# Patient Record
Sex: Female | Born: 1940
Health system: Southern US, Community
[De-identification: ages and names within clinical notes are randomized; demographics above are authoritative.]

## PROBLEM LIST (undated history)

## (undated) DIAGNOSIS — I10 Essential (primary) hypertension: Secondary | ICD-10-CM

## (undated) DIAGNOSIS — R0789 Other chest pain: Secondary | ICD-10-CM

## (undated) DIAGNOSIS — E785 Hyperlipidemia, unspecified: Secondary | ICD-10-CM

## (undated) DIAGNOSIS — Z8673 Personal history of transient ischemic attack (TIA), and cerebral infarction without residual deficits: Secondary | ICD-10-CM

## (undated) DIAGNOSIS — K219 Gastro-esophageal reflux disease without esophagitis: Secondary | ICD-10-CM

## (undated) DIAGNOSIS — C73 Malignant neoplasm of thyroid gland: Secondary | ICD-10-CM

## (undated) DIAGNOSIS — M1990 Unspecified osteoarthritis, unspecified site: Secondary | ICD-10-CM

## (undated) DIAGNOSIS — N289 Disorder of kidney and ureter, unspecified: Secondary | ICD-10-CM

## (undated) DIAGNOSIS — D649 Anemia, unspecified: Secondary | ICD-10-CM

## (undated) HISTORY — PX: THYROIDECTOMY: SHX17

## (undated) HISTORY — DX: Anemia, unspecified: D64.9

## (undated) HISTORY — PX: BACK SURGERY: SHX140

## (undated) HISTORY — DX: Malignant neoplasm of thyroid gland: C73

## (undated) HISTORY — DX: Unspecified osteoarthritis, unspecified site: M19.90

## (undated) HISTORY — PX: TOTAL KNEE ARTHROPLASTY: SHX125

## (undated) HISTORY — PX: LUMBAR FUSION: SHX111

## (undated) HISTORY — PX: OTHER SURGICAL HISTORY: SHX169

## (undated) HISTORY — DX: Personal history of transient ischemic attack (TIA), and cerebral infarction without residual deficits: Z86.73

## (undated) HISTORY — DX: Gastro-esophageal reflux disease without esophagitis: K21.9

## (undated) HISTORY — DX: Other chest pain: R07.89

## (undated) HISTORY — DX: Hyperlipidemia, unspecified: E78.5

## (undated) HISTORY — PX: BREAST BIOPSY: SHX20

## (undated) HISTORY — DX: Essential (primary) hypertension: I10

---

## 1997-05-28 ENCOUNTER — Other Ambulatory Visit: Admission: RE | Admit: 1997-05-28 | Discharge: 1997-05-28 | Payer: Self-pay | Admitting: Family Medicine

## 1998-07-24 ENCOUNTER — Other Ambulatory Visit: Admission: RE | Admit: 1998-07-24 | Discharge: 1998-07-24 | Payer: Self-pay | Admitting: Family Medicine

## 1998-12-16 ENCOUNTER — Ambulatory Visit (HOSPITAL_COMMUNITY): Admission: RE | Admit: 1998-12-16 | Discharge: 1998-12-16 | Payer: Self-pay | Admitting: Family Medicine

## 1999-08-06 ENCOUNTER — Other Ambulatory Visit: Admission: RE | Admit: 1999-08-06 | Discharge: 1999-08-06 | Payer: Self-pay | Admitting: Family Medicine

## 2000-03-18 ENCOUNTER — Emergency Department (HOSPITAL_COMMUNITY): Admission: EM | Admit: 2000-03-18 | Discharge: 2000-03-18 | Payer: Self-pay | Admitting: Emergency Medicine

## 2000-03-22 ENCOUNTER — Encounter: Admission: RE | Admit: 2000-03-22 | Discharge: 2000-03-22 | Payer: Self-pay | Admitting: Family Medicine

## 2000-03-22 ENCOUNTER — Encounter: Payer: Self-pay | Admitting: Family Medicine

## 2000-08-05 ENCOUNTER — Encounter: Payer: Self-pay | Admitting: Specialist

## 2000-08-05 ENCOUNTER — Encounter: Admission: RE | Admit: 2000-08-05 | Discharge: 2000-08-05 | Payer: Self-pay | Admitting: Specialist

## 2000-08-15 ENCOUNTER — Encounter: Admission: RE | Admit: 2000-08-15 | Discharge: 2000-11-13 | Payer: Self-pay | Admitting: Family Medicine

## 2000-08-15 ENCOUNTER — Other Ambulatory Visit: Admission: RE | Admit: 2000-08-15 | Discharge: 2000-08-15 | Payer: Self-pay | Admitting: Family Medicine

## 2000-08-18 ENCOUNTER — Encounter: Payer: Self-pay | Admitting: Family Medicine

## 2000-08-18 ENCOUNTER — Encounter: Admission: RE | Admit: 2000-08-18 | Discharge: 2000-08-18 | Payer: Self-pay | Admitting: Family Medicine

## 2000-11-03 ENCOUNTER — Encounter: Payer: Self-pay | Admitting: Specialist

## 2000-11-03 ENCOUNTER — Ambulatory Visit (HOSPITAL_COMMUNITY): Admission: RE | Admit: 2000-11-03 | Discharge: 2000-11-03 | Payer: Self-pay | Admitting: Specialist

## 2001-07-06 ENCOUNTER — Encounter: Payer: Self-pay | Admitting: Specialist

## 2001-07-11 ENCOUNTER — Inpatient Hospital Stay (HOSPITAL_COMMUNITY): Admission: RE | Admit: 2001-07-11 | Discharge: 2001-07-19 | Payer: Self-pay | Admitting: Specialist

## 2001-07-12 ENCOUNTER — Encounter: Payer: Self-pay | Admitting: Specialist

## 2001-07-13 ENCOUNTER — Encounter: Payer: Self-pay | Admitting: Specialist

## 2001-07-14 ENCOUNTER — Encounter: Payer: Self-pay | Admitting: Critical Care Medicine

## 2001-08-22 ENCOUNTER — Encounter: Admission: RE | Admit: 2001-08-22 | Discharge: 2001-08-22 | Payer: Self-pay | Admitting: Family Medicine

## 2001-08-22 ENCOUNTER — Encounter: Payer: Self-pay | Admitting: Family Medicine

## 2002-02-21 ENCOUNTER — Ambulatory Visit (HOSPITAL_COMMUNITY): Admission: RE | Admit: 2002-02-21 | Discharge: 2002-02-21 | Payer: Self-pay | Admitting: *Deleted

## 2002-09-18 ENCOUNTER — Other Ambulatory Visit: Admission: RE | Admit: 2002-09-18 | Discharge: 2002-09-18 | Payer: Self-pay | Admitting: Family Medicine

## 2003-05-24 ENCOUNTER — Ambulatory Visit (HOSPITAL_COMMUNITY): Admission: RE | Admit: 2003-05-24 | Discharge: 2003-05-24 | Payer: Self-pay | Admitting: Family Medicine

## 2003-08-05 ENCOUNTER — Ambulatory Visit (HOSPITAL_COMMUNITY): Admission: RE | Admit: 2003-08-05 | Discharge: 2003-08-05 | Payer: Self-pay | Admitting: Family Medicine

## 2003-10-08 ENCOUNTER — Ambulatory Visit: Payer: Self-pay | Admitting: *Deleted

## 2003-10-30 ENCOUNTER — Ambulatory Visit: Payer: Self-pay | Admitting: Family Medicine

## 2003-11-15 ENCOUNTER — Emergency Department (HOSPITAL_COMMUNITY): Admission: EM | Admit: 2003-11-15 | Discharge: 2003-11-15 | Payer: Self-pay | Admitting: Family Medicine

## 2003-11-18 ENCOUNTER — Ambulatory Visit: Payer: Self-pay | Admitting: Family Medicine

## 2003-12-06 ENCOUNTER — Ambulatory Visit: Payer: Self-pay | Admitting: Family Medicine

## 2004-03-02 ENCOUNTER — Ambulatory Visit: Payer: Self-pay | Admitting: Family Medicine

## 2004-03-19 ENCOUNTER — Ambulatory Visit (HOSPITAL_COMMUNITY): Admission: RE | Admit: 2004-03-19 | Discharge: 2004-03-19 | Payer: Self-pay | Admitting: Gastroenterology

## 2004-04-07 ENCOUNTER — Emergency Department (HOSPITAL_COMMUNITY): Admission: EM | Admit: 2004-04-07 | Discharge: 2004-04-07 | Payer: Self-pay | Admitting: Family Medicine

## 2004-04-09 ENCOUNTER — Emergency Department (HOSPITAL_COMMUNITY): Admission: EM | Admit: 2004-04-09 | Discharge: 2004-04-09 | Payer: Self-pay | Admitting: Emergency Medicine

## 2004-06-20 ENCOUNTER — Emergency Department (HOSPITAL_COMMUNITY): Admission: EM | Admit: 2004-06-20 | Discharge: 2004-06-20 | Payer: Self-pay | Admitting: Family Medicine

## 2004-06-23 ENCOUNTER — Ambulatory Visit: Admission: RE | Admit: 2004-06-23 | Discharge: 2004-06-23 | Payer: Self-pay | Admitting: Specialist

## 2004-09-21 ENCOUNTER — Other Ambulatory Visit: Admission: RE | Admit: 2004-09-21 | Discharge: 2004-09-21 | Payer: Self-pay | Admitting: Family Medicine

## 2004-12-31 ENCOUNTER — Emergency Department (HOSPITAL_COMMUNITY): Admission: EM | Admit: 2004-12-31 | Discharge: 2004-12-31 | Payer: Self-pay | Admitting: Emergency Medicine

## 2005-01-12 ENCOUNTER — Emergency Department (HOSPITAL_COMMUNITY): Admission: EM | Admit: 2005-01-12 | Discharge: 2005-01-13 | Payer: Self-pay | Admitting: Family Medicine

## 2005-01-22 ENCOUNTER — Encounter: Admission: RE | Admit: 2005-01-22 | Discharge: 2005-01-22 | Payer: Self-pay | Admitting: Family Medicine

## 2005-04-12 ENCOUNTER — Emergency Department (HOSPITAL_COMMUNITY): Admission: EM | Admit: 2005-04-12 | Discharge: 2005-04-12 | Payer: Self-pay | Admitting: Family Medicine

## 2005-09-05 ENCOUNTER — Emergency Department (HOSPITAL_COMMUNITY): Admission: EM | Admit: 2005-09-05 | Discharge: 2005-09-05 | Payer: Self-pay | Admitting: Emergency Medicine

## 2005-11-19 ENCOUNTER — Encounter: Admission: RE | Admit: 2005-11-19 | Discharge: 2005-11-19 | Payer: Self-pay | Admitting: Family Medicine

## 2006-10-02 ENCOUNTER — Encounter: Admission: RE | Admit: 2006-10-02 | Discharge: 2006-10-02 | Payer: Self-pay | Admitting: Family Medicine

## 2006-10-06 ENCOUNTER — Encounter: Admission: RE | Admit: 2006-10-06 | Discharge: 2006-10-06 | Payer: Self-pay | Admitting: Family Medicine

## 2006-10-21 ENCOUNTER — Emergency Department (HOSPITAL_COMMUNITY): Admission: EM | Admit: 2006-10-21 | Discharge: 2006-10-21 | Payer: Self-pay | Admitting: Emergency Medicine

## 2006-10-27 ENCOUNTER — Encounter: Admission: RE | Admit: 2006-10-27 | Discharge: 2006-10-27 | Payer: Self-pay | Admitting: Family Medicine

## 2006-11-09 ENCOUNTER — Encounter: Admission: RE | Admit: 2006-11-09 | Discharge: 2006-11-09 | Payer: Self-pay | Admitting: Family Medicine

## 2006-12-09 ENCOUNTER — Encounter: Admission: RE | Admit: 2006-12-09 | Discharge: 2006-12-09 | Payer: Self-pay | Admitting: Family Medicine

## 2007-01-10 ENCOUNTER — Encounter: Admission: RE | Admit: 2007-01-10 | Discharge: 2007-01-10 | Payer: Self-pay | Admitting: Family Medicine

## 2007-01-26 ENCOUNTER — Emergency Department (HOSPITAL_COMMUNITY): Admission: EM | Admit: 2007-01-26 | Discharge: 2007-01-26 | Payer: Self-pay | Admitting: Emergency Medicine

## 2007-01-28 ENCOUNTER — Emergency Department (HOSPITAL_COMMUNITY): Admission: EM | Admit: 2007-01-28 | Discharge: 2007-01-28 | Payer: Self-pay | Admitting: Emergency Medicine

## 2007-02-15 ENCOUNTER — Inpatient Hospital Stay (HOSPITAL_COMMUNITY): Admission: RE | Admit: 2007-02-15 | Discharge: 2007-02-23 | Payer: Self-pay | Admitting: Neurosurgery

## 2007-02-17 ENCOUNTER — Ambulatory Visit: Payer: Self-pay | Admitting: Physical Medicine & Rehabilitation

## 2007-05-14 ENCOUNTER — Emergency Department (HOSPITAL_COMMUNITY): Admission: EM | Admit: 2007-05-14 | Discharge: 2007-05-14 | Payer: Self-pay | Admitting: Emergency Medicine

## 2007-05-17 ENCOUNTER — Encounter: Admission: RE | Admit: 2007-05-17 | Discharge: 2007-05-17 | Payer: Self-pay | Admitting: Neurosurgery

## 2007-05-18 ENCOUNTER — Inpatient Hospital Stay (HOSPITAL_COMMUNITY): Admission: AD | Admit: 2007-05-18 | Discharge: 2007-05-26 | Payer: Self-pay | Admitting: Neurosurgery

## 2007-05-20 ENCOUNTER — Ambulatory Visit: Payer: Self-pay | Admitting: Infectious Diseases

## 2007-05-22 ENCOUNTER — Ambulatory Visit: Payer: Self-pay | Admitting: Surgery

## 2007-05-22 ENCOUNTER — Encounter: Payer: Self-pay | Admitting: Infectious Diseases

## 2007-06-02 ENCOUNTER — Encounter: Admission: RE | Admit: 2007-06-02 | Discharge: 2007-06-02 | Payer: Self-pay | Admitting: Neurosurgery

## 2007-06-07 ENCOUNTER — Encounter: Payer: Self-pay | Admitting: Infectious Disease

## 2007-06-09 ENCOUNTER — Telehealth: Payer: Self-pay | Admitting: Infectious Disease

## 2007-06-09 DIAGNOSIS — K6812 Psoas muscle abscess: Secondary | ICD-10-CM | POA: Insufficient documentation

## 2007-06-21 ENCOUNTER — Encounter: Payer: Self-pay | Admitting: Infectious Disease

## 2007-06-26 ENCOUNTER — Encounter: Admission: RE | Admit: 2007-06-26 | Discharge: 2007-06-26 | Payer: Self-pay | Admitting: Infectious Disease

## 2007-07-03 ENCOUNTER — Ambulatory Visit: Payer: Self-pay | Admitting: Infectious Disease

## 2007-07-03 DIAGNOSIS — E119 Type 2 diabetes mellitus without complications: Secondary | ICD-10-CM | POA: Insufficient documentation

## 2007-07-03 DIAGNOSIS — G061 Intraspinal abscess and granuloma: Secondary | ICD-10-CM | POA: Insufficient documentation

## 2007-07-03 DIAGNOSIS — I10 Essential (primary) hypertension: Secondary | ICD-10-CM | POA: Insufficient documentation

## 2007-07-03 DIAGNOSIS — Z794 Long term (current) use of insulin: Secondary | ICD-10-CM

## 2007-07-03 DIAGNOSIS — E039 Hypothyroidism, unspecified: Secondary | ICD-10-CM | POA: Insufficient documentation

## 2007-07-03 LAB — CONVERTED CEMR LAB
Basophils Absolute: 0 10*3/uL (ref 0.0–0.1)
CO2: 23 meq/L (ref 19–32)
Chloride: 101 meq/L (ref 96–112)
Creatinine, Ser: 0.55 mg/dL (ref 0.40–1.20)
Eosinophils Relative: 1 % (ref 0–5)
HCT: 37 % (ref 36.0–46.0)
Lymphocytes Relative: 11 % — ABNORMAL LOW (ref 12–46)
Lymphs Abs: 1.4 10*3/uL (ref 0.7–4.0)
Neutro Abs: 10.3 10*3/uL — ABNORMAL HIGH (ref 1.7–7.7)
Neutrophils Relative %: 83 % — ABNORMAL HIGH (ref 43–77)
Platelets: 407 10*3/uL — ABNORMAL HIGH (ref 150–400)
Potassium: 4.4 meq/L (ref 3.5–5.3)
RDW: 15.6 % — ABNORMAL HIGH (ref 11.5–15.5)
Sed Rate: 14 mm/hr (ref 0–22)
Sodium: 140 meq/L (ref 135–145)
WBC: 12.5 10*3/uL — ABNORMAL HIGH (ref 4.0–10.5)

## 2007-08-07 ENCOUNTER — Encounter: Admission: RE | Admit: 2007-08-07 | Discharge: 2007-08-07 | Payer: Self-pay | Admitting: Infectious Disease

## 2007-08-14 ENCOUNTER — Ambulatory Visit: Payer: Self-pay | Admitting: Infectious Disease

## 2007-08-14 DIAGNOSIS — D72829 Elevated white blood cell count, unspecified: Secondary | ICD-10-CM | POA: Insufficient documentation

## 2007-08-14 LAB — CONVERTED CEMR LAB
ALT: 14 units/L (ref 0–35)
AST: 16 units/L (ref 0–37)
Alkaline Phosphatase: 80 units/L (ref 39–117)
CO2: 27 meq/L (ref 19–32)
Calcium: 8.2 mg/dL — ABNORMAL LOW (ref 8.4–10.5)
Creatinine, Ser: 0.68 mg/dL (ref 0.40–1.20)
Eosinophils Absolute: 0.2 10*3/uL (ref 0.0–0.7)
Glucose, Bld: 102 mg/dL — ABNORMAL HIGH (ref 70–99)
Hemoglobin: 11.3 g/dL — ABNORMAL LOW (ref 12.0–15.0)
Lymphs Abs: 2.3 10*3/uL (ref 0.7–4.0)
MCHC: 30.7 g/dL (ref 30.0–36.0)
MCV: 88.7 fL (ref 78.0–100.0)
Monocytes Absolute: 0.9 10*3/uL (ref 0.1–1.0)
Monocytes Relative: 6 % (ref 3–12)
Neutro Abs: 10.9 10*3/uL — ABNORMAL HIGH (ref 1.7–7.7)
Neutrophils Relative %: 76 % (ref 43–77)
Potassium: 4.5 meq/L (ref 3.5–5.3)
RBC: 4.15 M/uL (ref 3.87–5.11)
Sodium: 145 meq/L (ref 135–145)
Total Bilirubin: 0.3 mg/dL (ref 0.3–1.2)
WBC: 14.3 10*3/uL — ABNORMAL HIGH (ref 4.0–10.5)

## 2007-08-25 ENCOUNTER — Encounter: Payer: Self-pay | Admitting: Infectious Disease

## 2007-09-19 ENCOUNTER — Encounter: Admission: RE | Admit: 2007-09-19 | Discharge: 2007-09-19 | Payer: Self-pay | Admitting: Family Medicine

## 2007-10-20 ENCOUNTER — Encounter: Payer: Self-pay | Admitting: Infectious Disease

## 2007-12-08 ENCOUNTER — Encounter: Payer: Self-pay | Admitting: Infectious Disease

## 2007-12-18 ENCOUNTER — Ambulatory Visit: Payer: Self-pay | Admitting: Infectious Disease

## 2007-12-18 ENCOUNTER — Ambulatory Visit (HOSPITAL_COMMUNITY): Admission: RE | Admit: 2007-12-18 | Discharge: 2007-12-18 | Payer: Self-pay | Admitting: Infectious Disease

## 2007-12-18 DIAGNOSIS — R079 Chest pain, unspecified: Secondary | ICD-10-CM | POA: Insufficient documentation

## 2008-03-11 ENCOUNTER — Emergency Department (HOSPITAL_COMMUNITY): Admission: EM | Admit: 2008-03-11 | Discharge: 2008-03-11 | Payer: Self-pay | Admitting: Emergency Medicine

## 2008-03-26 DIAGNOSIS — M48061 Spinal stenosis, lumbar region without neurogenic claudication: Secondary | ICD-10-CM | POA: Insufficient documentation

## 2008-04-07 ENCOUNTER — Emergency Department (HOSPITAL_COMMUNITY): Admission: EM | Admit: 2008-04-07 | Discharge: 2008-04-07 | Payer: Self-pay | Admitting: Emergency Medicine

## 2008-05-22 ENCOUNTER — Encounter: Admission: RE | Admit: 2008-05-22 | Discharge: 2008-05-22 | Payer: Self-pay | Admitting: Family Medicine

## 2008-06-07 ENCOUNTER — Encounter: Payer: Self-pay | Admitting: Infectious Disease

## 2008-07-25 ENCOUNTER — Inpatient Hospital Stay (HOSPITAL_COMMUNITY): Admission: EM | Admit: 2008-07-25 | Discharge: 2008-07-26 | Payer: Self-pay | Admitting: Emergency Medicine

## 2008-07-25 ENCOUNTER — Ambulatory Visit: Payer: Self-pay | Admitting: Vascular Surgery

## 2008-07-25 ENCOUNTER — Encounter (INDEPENDENT_AMBULATORY_CARE_PROVIDER_SITE_OTHER): Payer: Self-pay | Admitting: Internal Medicine

## 2008-08-15 ENCOUNTER — Ambulatory Visit: Payer: Self-pay | Admitting: Infectious Disease

## 2008-08-15 DIAGNOSIS — I69959 Hemiplegia and hemiparesis following unspecified cerebrovascular disease affecting unspecified side: Secondary | ICD-10-CM | POA: Insufficient documentation

## 2008-08-15 DIAGNOSIS — I10 Essential (primary) hypertension: Secondary | ICD-10-CM | POA: Insufficient documentation

## 2008-08-15 LAB — CONVERTED CEMR LAB
BUN: 11 mg/dL (ref 6–23)
Basophils Relative: 0 % (ref 0–1)
CRP: 0.4 mg/dL (ref <0.6)
Calcium: 8.4 mg/dL (ref 8.4–10.5)
Chloride: 105 meq/L (ref 96–112)
Creatinine, Ser: 0.65 mg/dL (ref 0.40–1.20)
Eosinophils Absolute: 0.2 10*3/uL (ref 0.0–0.7)
Lymphs Abs: 2 10*3/uL (ref 0.7–4.0)
MCHC: 31.6 g/dL (ref 30.0–36.0)
MCV: 88.9 fL (ref >=78.0)
Monocytes Relative: 5 % (ref 3–12)
Neutro Abs: 10.4 10*3/uL — ABNORMAL HIGH (ref 1.7–7.7)
Neutrophils Relative %: 79 % — ABNORMAL HIGH (ref 43–77)
Platelets: 306 10*3/uL (ref 150–400)
RBC: 4.06 M/uL (ref 3.87–5.11)
Sed Rate: 10 mm/hr (ref 0–22)
WBC: 13.3 10*3/uL — ABNORMAL HIGH (ref 4.0–10.5)

## 2008-08-21 ENCOUNTER — Encounter: Admission: RE | Admit: 2008-08-21 | Discharge: 2008-08-21 | Payer: Self-pay | Admitting: Family Medicine

## 2008-08-21 ENCOUNTER — Encounter (INDEPENDENT_AMBULATORY_CARE_PROVIDER_SITE_OTHER): Payer: Self-pay | Admitting: Interventional Radiology

## 2008-08-21 ENCOUNTER — Other Ambulatory Visit: Admission: RE | Admit: 2008-08-21 | Discharge: 2008-08-21 | Payer: Self-pay | Admitting: Interventional Radiology

## 2008-09-26 DIAGNOSIS — Z85850 Personal history of malignant neoplasm of thyroid: Secondary | ICD-10-CM | POA: Insufficient documentation

## 2008-09-28 ENCOUNTER — Emergency Department (HOSPITAL_COMMUNITY): Admission: EM | Admit: 2008-09-28 | Discharge: 2008-09-28 | Payer: Self-pay | Admitting: Emergency Medicine

## 2008-10-02 ENCOUNTER — Telehealth: Payer: Self-pay

## 2008-10-02 ENCOUNTER — Ambulatory Visit: Payer: Self-pay | Admitting: Infectious Disease

## 2008-10-02 ENCOUNTER — Observation Stay (HOSPITAL_COMMUNITY): Admission: AD | Admit: 2008-10-02 | Discharge: 2008-10-04 | Payer: Self-pay | Admitting: Internal Medicine

## 2008-10-02 DIAGNOSIS — M549 Dorsalgia, unspecified: Secondary | ICD-10-CM | POA: Insufficient documentation

## 2008-10-02 DIAGNOSIS — R110 Nausea: Secondary | ICD-10-CM | POA: Insufficient documentation

## 2008-10-03 ENCOUNTER — Ambulatory Visit: Payer: Self-pay | Admitting: Infectious Diseases

## 2008-10-07 ENCOUNTER — Encounter: Payer: Self-pay | Admitting: Infectious Disease

## 2008-10-09 ENCOUNTER — Encounter: Admission: RE | Admit: 2008-10-09 | Discharge: 2008-10-09 | Payer: Self-pay | Admitting: Family Medicine

## 2008-10-16 DIAGNOSIS — I69359 Hemiplegia and hemiparesis following cerebral infarction affecting unspecified side: Secondary | ICD-10-CM | POA: Insufficient documentation

## 2008-10-24 ENCOUNTER — Encounter (INDEPENDENT_AMBULATORY_CARE_PROVIDER_SITE_OTHER): Payer: Self-pay | Admitting: General Surgery

## 2008-10-24 ENCOUNTER — Inpatient Hospital Stay (HOSPITAL_COMMUNITY): Admission: RE | Admit: 2008-10-24 | Discharge: 2008-10-27 | Payer: Self-pay | Admitting: General Surgery

## 2008-11-19 DIAGNOSIS — J309 Allergic rhinitis, unspecified: Secondary | ICD-10-CM | POA: Insufficient documentation

## 2008-12-09 ENCOUNTER — Encounter (HOSPITAL_COMMUNITY): Admission: RE | Admit: 2008-12-09 | Discharge: 2009-01-03 | Payer: Self-pay | Admitting: Endocrinology

## 2008-12-18 ENCOUNTER — Encounter (HOSPITAL_COMMUNITY): Admission: RE | Admit: 2008-12-18 | Discharge: 2009-01-03 | Payer: Self-pay | Admitting: Endocrinology

## 2009-02-06 ENCOUNTER — Encounter: Payer: Self-pay | Admitting: Infectious Disease

## 2009-02-10 ENCOUNTER — Emergency Department (HOSPITAL_COMMUNITY): Admission: EM | Admit: 2009-02-10 | Discharge: 2009-02-10 | Payer: Self-pay | Admitting: Emergency Medicine

## 2009-02-10 ENCOUNTER — Ambulatory Visit (HOSPITAL_COMMUNITY): Admission: RE | Admit: 2009-02-10 | Discharge: 2009-02-10 | Payer: Self-pay | Admitting: Infectious Disease

## 2009-02-10 ENCOUNTER — Encounter (INDEPENDENT_AMBULATORY_CARE_PROVIDER_SITE_OTHER): Payer: Self-pay | Admitting: Emergency Medicine

## 2009-02-10 ENCOUNTER — Ambulatory Visit: Payer: Self-pay | Admitting: Infectious Disease

## 2009-02-10 DIAGNOSIS — R0789 Other chest pain: Secondary | ICD-10-CM | POA: Insufficient documentation

## 2009-02-10 DIAGNOSIS — Z85850 Personal history of malignant neoplasm of thyroid: Secondary | ICD-10-CM

## 2009-02-11 ENCOUNTER — Ambulatory Visit: Payer: Self-pay | Admitting: Surgery

## 2009-02-11 ENCOUNTER — Encounter (INDEPENDENT_AMBULATORY_CARE_PROVIDER_SITE_OTHER): Payer: Self-pay | Admitting: Emergency Medicine

## 2009-02-11 ENCOUNTER — Ambulatory Visit (HOSPITAL_COMMUNITY): Admission: RE | Admit: 2009-02-11 | Discharge: 2009-02-11 | Payer: Self-pay | Admitting: Emergency Medicine

## 2009-02-17 ENCOUNTER — Encounter: Payer: Self-pay | Admitting: Infectious Disease

## 2009-03-10 ENCOUNTER — Encounter: Admission: RE | Admit: 2009-03-10 | Discharge: 2009-03-10 | Payer: Self-pay | Admitting: Cardiology

## 2009-03-18 ENCOUNTER — Inpatient Hospital Stay (HOSPITAL_BASED_OUTPATIENT_CLINIC_OR_DEPARTMENT_OTHER): Admission: RE | Admit: 2009-03-18 | Discharge: 2009-03-18 | Payer: Self-pay | Admitting: Cardiology

## 2009-03-18 HISTORY — PX: CARDIAC CATHETERIZATION: SHX172

## 2009-08-07 ENCOUNTER — Ambulatory Visit: Payer: Self-pay | Admitting: Infectious Disease

## 2009-08-07 DIAGNOSIS — IMO0002 Reserved for concepts with insufficient information to code with codable children: Secondary | ICD-10-CM | POA: Insufficient documentation

## 2009-08-07 DIAGNOSIS — M1710 Unilateral primary osteoarthritis, unspecified knee: Secondary | ICD-10-CM

## 2009-08-07 LAB — CONVERTED CEMR LAB
Basophils Absolute: 0 10*3/uL (ref 0.0–0.1)
Basophils Relative: 0 % (ref 0–1)
CO2: 29 meq/L (ref 19–32)
Chloride: 104 meq/L (ref 96–112)
Creatinine, Ser: 1.03 mg/dL (ref 0.40–1.20)
Eosinophils Relative: 2 % (ref 0–5)
Glucose, Bld: 97 mg/dL (ref 70–99)
HCT: 31 % — ABNORMAL LOW (ref 36.0–46.0)
Hemoglobin: 9.6 g/dL — ABNORMAL LOW (ref 12.0–15.0)
MCHC: 31 g/dL (ref 30.0–36.0)
Monocytes Absolute: 0.8 10*3/uL (ref 0.1–1.0)
Neutro Abs: 7.7 10*3/uL (ref 1.7–7.7)
Platelets: 297 10*3/uL (ref 150–400)
RDW: 13.1 % (ref 11.5–15.5)
Sed Rate: 23 mm/hr — ABNORMAL HIGH (ref 0–22)

## 2009-08-22 DIAGNOSIS — R918 Other nonspecific abnormal finding of lung field: Secondary | ICD-10-CM | POA: Insufficient documentation

## 2009-09-06 ENCOUNTER — Emergency Department (HOSPITAL_COMMUNITY): Admission: EM | Admit: 2009-09-06 | Discharge: 2009-09-06 | Payer: Self-pay | Admitting: Family Medicine

## 2009-09-16 ENCOUNTER — Ambulatory Visit (HOSPITAL_COMMUNITY): Admission: RE | Admit: 2009-09-16 | Discharge: 2009-09-16 | Payer: Self-pay | Admitting: Neurosurgery

## 2009-09-17 IMAGING — US US EXTREM LOW VENOUS BILAT
1 series · 14 of 24 positions shown · non-contrast
Comparison: None.

CLINICAL DATA: Bilateral lower extremity pain, swelling, and bruising. Assess
for DVT.
TECHNIQUE: Ultrasound scanning of the major venous structures of the thigh was
performed using color doppler suplmentation.

[Series 1: unknown · 14 of 46 slices shown]
[im 1/46]
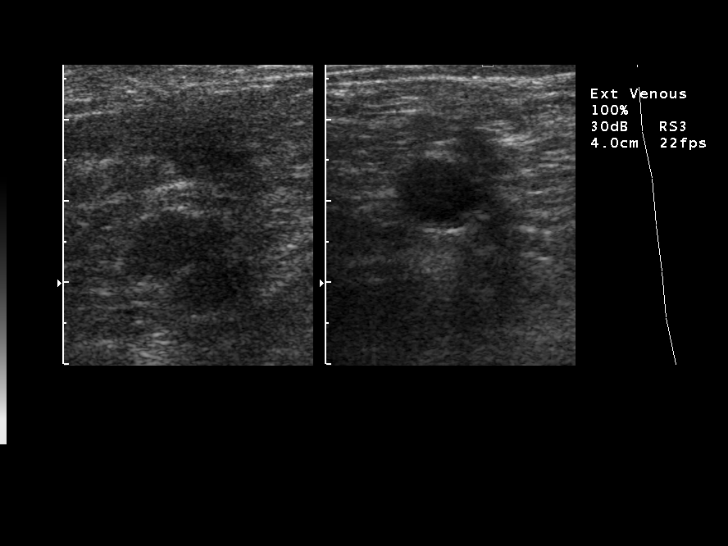
[im 4/46]
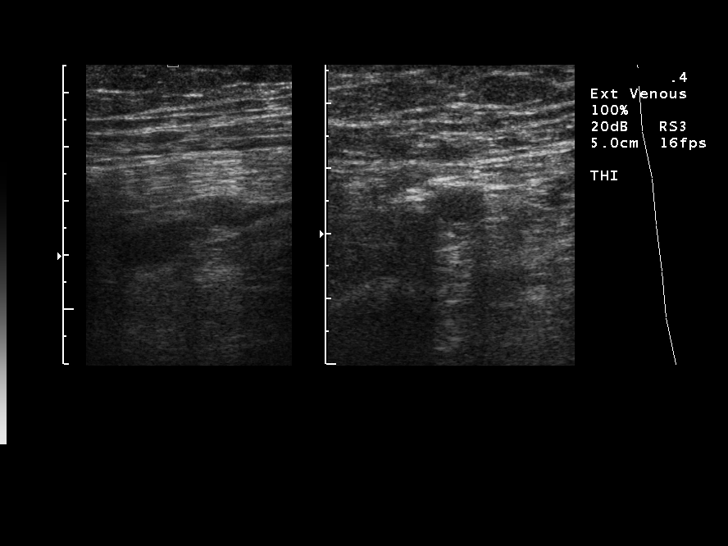
[im 8/46]
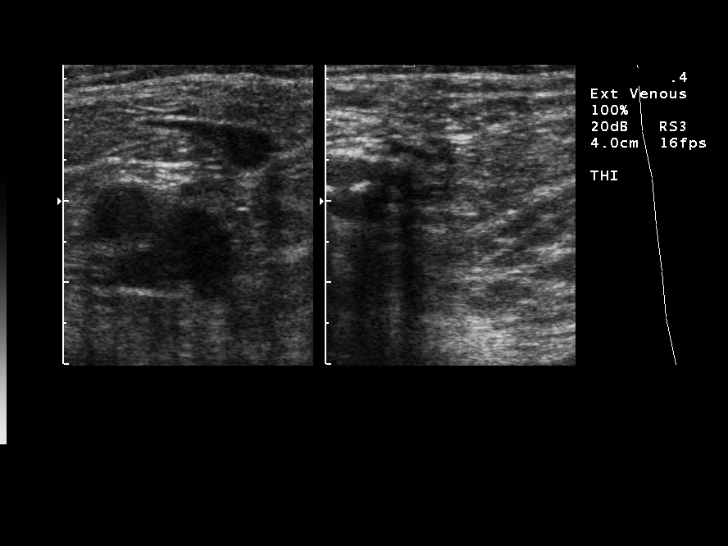
[im 12/46]
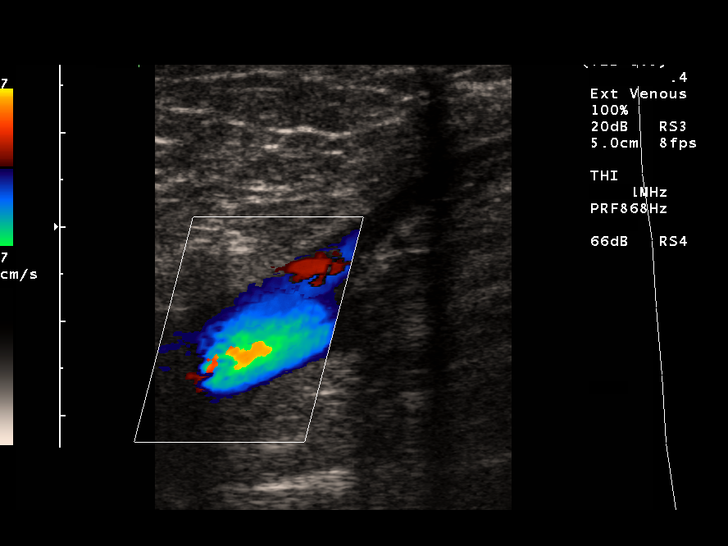
[im 14/46]
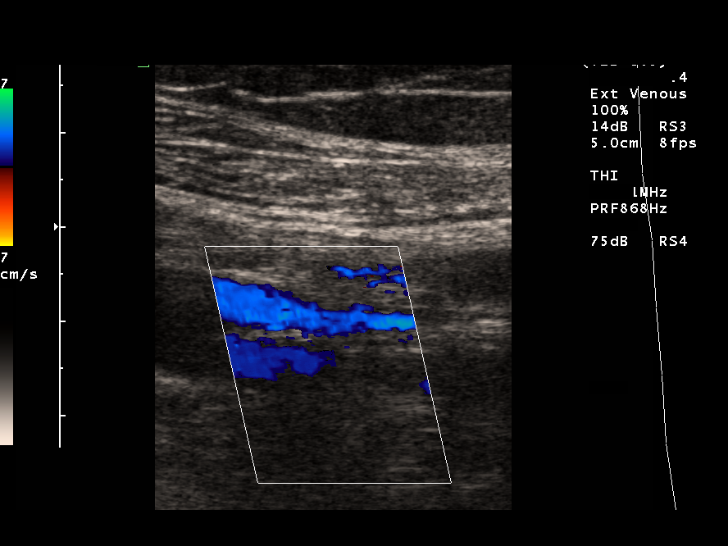
[im 18/46]
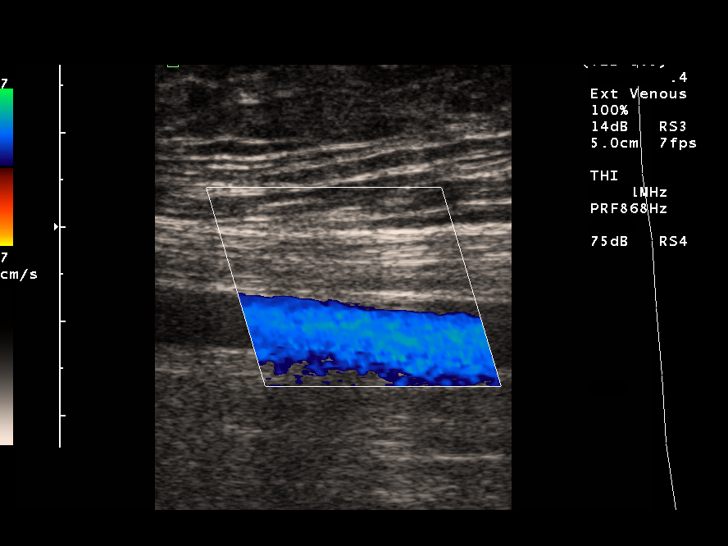
[im 22/46]
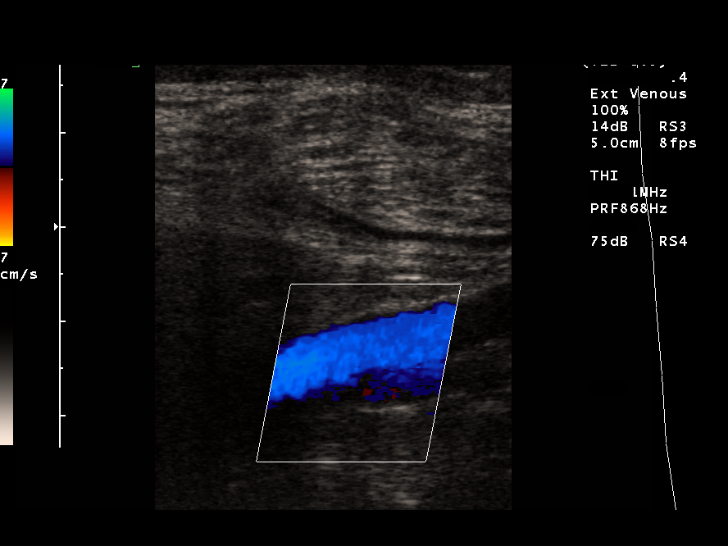
[im 24/46]
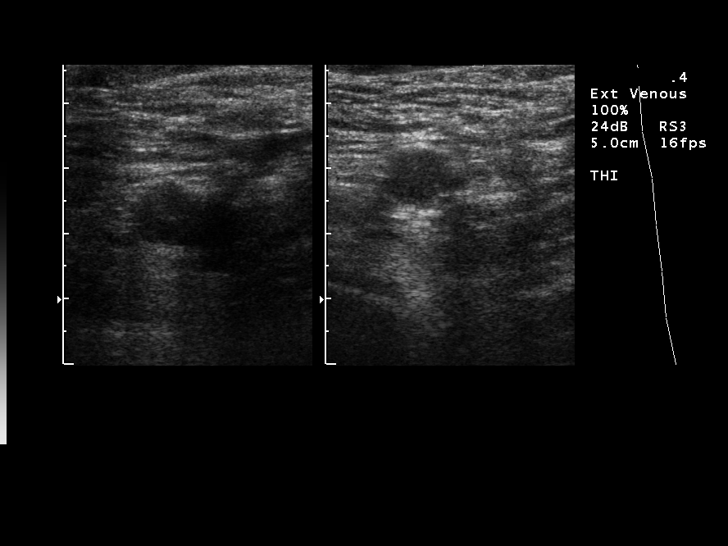
[im 28/46]
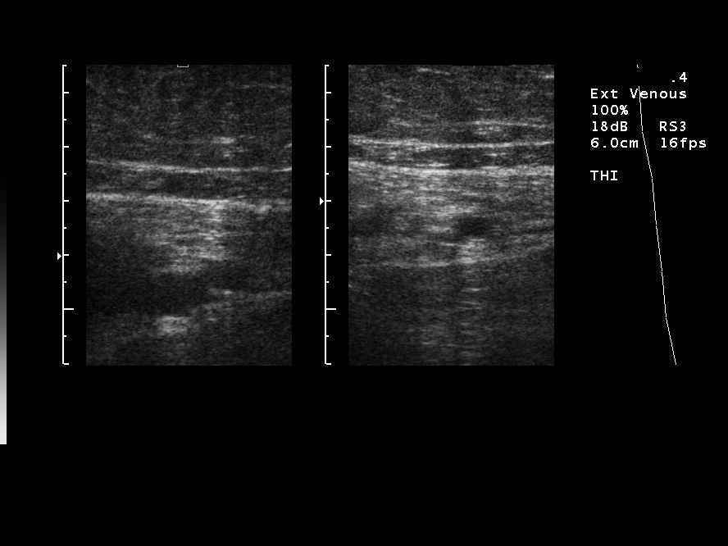
[im 32/46]
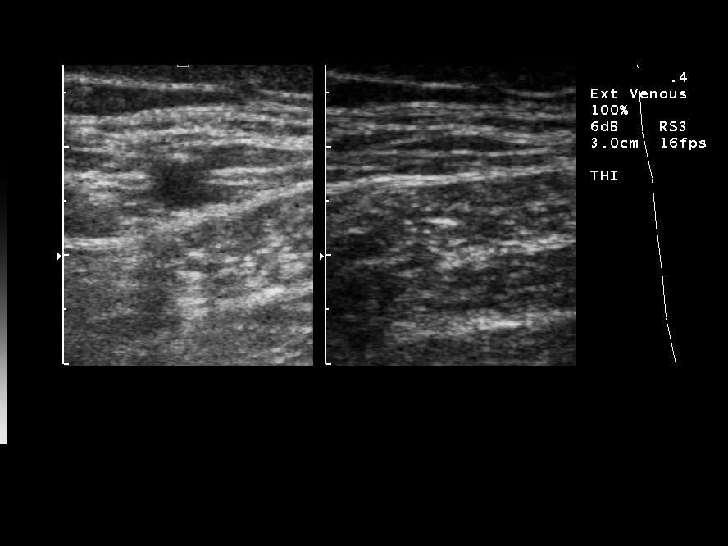
[im 36/46]
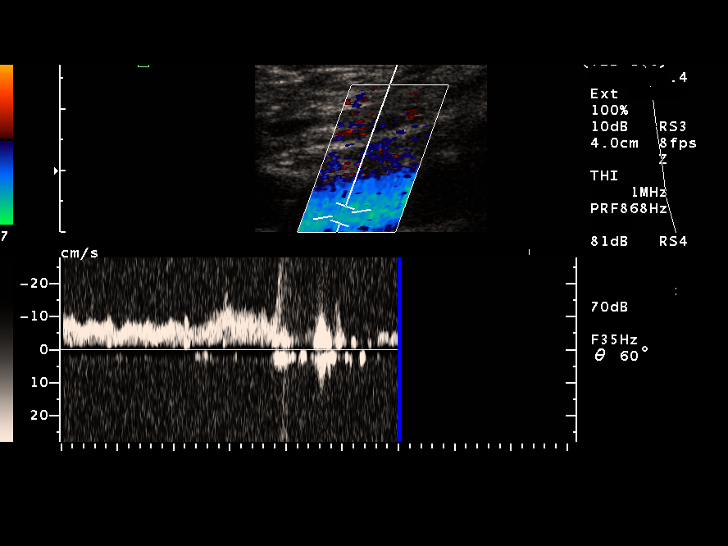
[im 38/46]
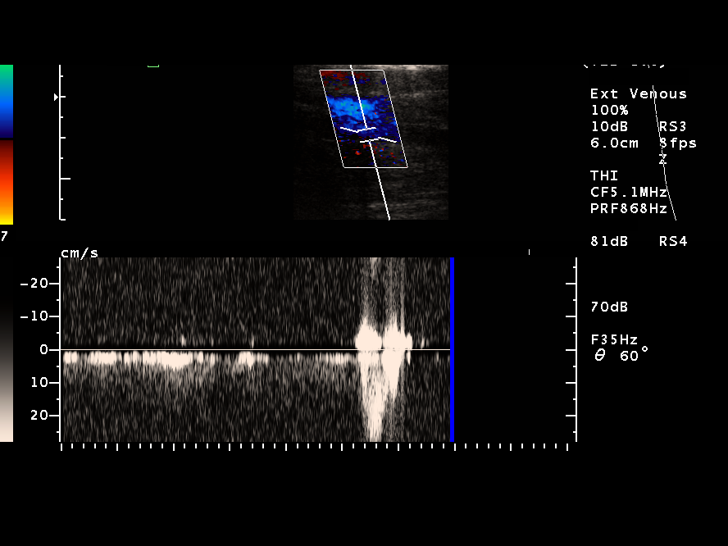
[im 42/46]
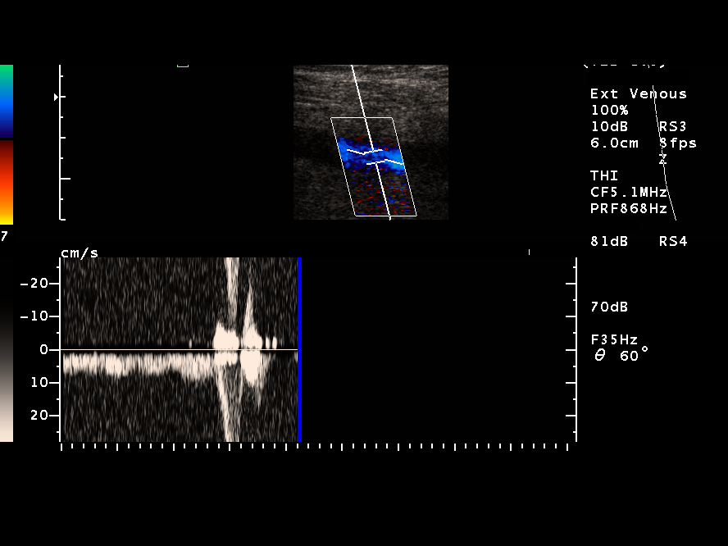
[im 46/46]
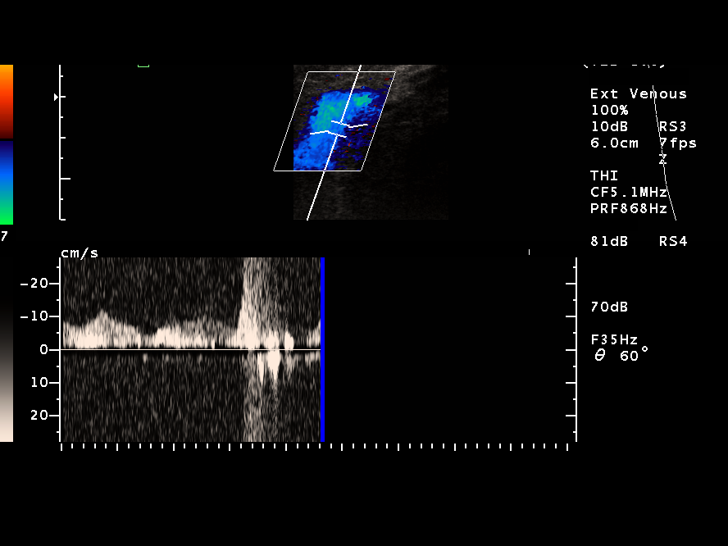

[14 of 24 positions shown; findings below may reference images not displayed]

LOWER EXTREMITY VENOUS ULTRASOUND BILATERAL:

Both lower extremities were examined.  The common femoral veins, superficial
femoral veins, deep femoral veins and popliteal veins show normal patent
directional flow, normal phasicity, normal augmentation and normal compression. 
The posterior tibial veins demonstrate normal compression.
IMPRESSION: No evidence for DVT in either lower extremity.

## 2009-09-26 ENCOUNTER — Emergency Department (HOSPITAL_COMMUNITY): Admission: EM | Admit: 2009-09-26 | Discharge: 2009-09-26 | Payer: Self-pay | Admitting: Emergency Medicine

## 2009-09-26 DIAGNOSIS — M8580 Other specified disorders of bone density and structure, unspecified site: Secondary | ICD-10-CM | POA: Insufficient documentation

## 2009-09-26 DIAGNOSIS — E890 Postprocedural hypothyroidism: Secondary | ICD-10-CM | POA: Insufficient documentation

## 2009-09-26 DIAGNOSIS — E1169 Type 2 diabetes mellitus with other specified complication: Secondary | ICD-10-CM | POA: Insufficient documentation

## 2009-10-02 ENCOUNTER — Emergency Department (HOSPITAL_COMMUNITY): Admission: EM | Admit: 2009-10-02 | Discharge: 2009-10-02 | Payer: Self-pay | Admitting: Emergency Medicine

## 2009-10-10 ENCOUNTER — Encounter: Admission: RE | Admit: 2009-10-10 | Discharge: 2009-10-10 | Payer: Self-pay | Admitting: Family Medicine

## 2009-10-14 ENCOUNTER — Encounter: Payer: Self-pay | Admitting: Infectious Disease

## 2009-10-22 ENCOUNTER — Ambulatory Visit (HOSPITAL_COMMUNITY): Admission: RE | Admit: 2009-10-22 | Discharge: 2009-10-22 | Payer: Self-pay | Admitting: Specialist

## 2009-11-10 ENCOUNTER — Encounter: Payer: Self-pay | Admitting: Infectious Disease

## 2010-01-19 ENCOUNTER — Encounter (HOSPITAL_COMMUNITY)
Admission: RE | Admit: 2010-01-19 | Discharge: 2010-02-03 | Payer: Self-pay | Source: Home / Self Care | Attending: Endocrinology | Admitting: Endocrinology

## 2010-01-25 ENCOUNTER — Encounter: Payer: Self-pay | Admitting: Family Medicine

## 2010-01-26 ENCOUNTER — Encounter: Payer: Self-pay | Admitting: Family Medicine

## 2010-02-01 LAB — CONVERTED CEMR LAB
AST: 15 units/L (ref 0–37)
Albumin: 3.3 g/dL — ABNORMAL LOW (ref 3.5–5.2)
Albumin: 3.7 g/dL (ref 3.5–5.2)
Alkaline Phosphatase: 39 units/L (ref 39–117)
BUN: 9 mg/dL (ref 6–23)
Basophils Absolute: 0 10*3/uL (ref 0.0–0.1)
CO2: 30 meq/L (ref 19–32)
CRP: 0.7 mg/dL — ABNORMAL HIGH (ref <0.6)
Calcium: 7.3 mg/dL — ABNORMAL LOW (ref 8.4–10.5)
Chloride: 103 meq/L (ref 96–112)
Creatinine, Ser: 0.56 mg/dL (ref 0.40–1.20)
Eosinophils Relative: 1 % (ref 0–5)
Eosinophils Relative: 2 % (ref 0–5)
Glucose, Bld: 89 mg/dL (ref 70–99)
HCT: 32.8 % — ABNORMAL LOW (ref 36.0–46.0)
HCT: 34.4 % — ABNORMAL LOW (ref 36.0–46.0)
Hemoglobin: 11.2 g/dL — ABNORMAL LOW (ref 12.0–15.0)
Lymphocytes Relative: 13 % (ref 12–46)
Lymphocytes Relative: 15 % (ref 12–46)
Lymphs Abs: 1.3 10*3/uL (ref 0.7–4.0)
Lymphs Abs: 1.7 10*3/uL (ref 0.7–4.0)
Monocytes Absolute: 0.6 10*3/uL (ref 0.1–1.0)
Monocytes Relative: 5 % (ref 3–12)
Neutro Abs: 8.4 10*3/uL — ABNORMAL HIGH (ref 1.7–7.7)
Neutro Abs: 8.6 10*3/uL — ABNORMAL HIGH (ref 1.7–7.7)
Neutrophils Relative %: 80 % — ABNORMAL HIGH (ref 43–77)
Platelets: 294 10*3/uL (ref 150–400)
Potassium: 3.9 meq/L (ref 3.5–5.3)
Potassium: 3.9 meq/L (ref 3.5–5.3)
RBC: 3.93 M/uL (ref 3.87–5.11)
RDW: 13.3 % (ref 11.5–15.5)
Relative Index: 2.1 (ref 0.0–2.5)
Sed Rate: 20 mm/hr (ref 0–22)
Sodium: 138 meq/L (ref 135–145)
Total Bilirubin: 0.4 mg/dL (ref 0.3–1.2)
Total CK: 112 units/L (ref 7–177)
Total Protein: 6.8 g/dL (ref 6.0–8.3)
Troponin I: <0.01 ng/mL (ref <0.06)
WBC: 10.7 10*3/uL — ABNORMAL HIGH (ref 4.0–10.5)
WBC: 10.9 10*3/uL — ABNORMAL HIGH (ref 4.0–10.5)

## 2010-02-05 NOTE — Consult Note (Signed)
Summary: [facility]  [facility]   Imported By: Bonner Puna 11/18/2009 15:41:05  _____________________________________________________________________  External Attachment:    Type:   Image     Comment:   External Document

## 2010-02-05 NOTE — Consult Note (Signed)
Summary: [facility]: New Pt. Eval  [facility]: New Pt. Eval   Imported By: Bonner Puna 02/27/2009 14:54:41  _____________________________________________________________________  External Attachment:    Type:   Image     Comment:   External Document

## 2010-02-05 NOTE — Assessment & Plan Note (Signed)
Summary: fukam   Visit Type:  Follow-up Primary Provider:  dewey  CC:  F/U.  History of Present Illness: 70 year old AA lady is s.p L4-5 decompression and fusion by Dr. [name] in February 2009 for severe spondylolisthesis, spinal stenosis with severe lower extremity pain. Pt then developed L3-4 diskitis, and epidural and paraspinal fluid collections in May 2009 when she underwent I and D by Dr. [name]. Her cultures on oral antibiotics failed to yield an organism. She was treated with aztreonama nd vancomycin, and later changed to oral suppressive rifampin and doxycycline. She is on  doxycyline alone. Her back pain is improving on tylenol #3. The pain is  goes from mid back up the back. Pain is worse with standing for prolonged times. Cant cook when standing. She uses a stool  She has new complaint of chest pain. Mid sternal pain which has been going on for a month. Seems like it happens with exertion. It is a funny feeling, tingling like pain, pain is 8/10 and it frightens her. No difficulty breathing. She does have diaphoresis with it.  Does not radiate to shoulder or back. AFter sitting down and praying it goes away--she is unsure of how long this. takes. SHe is very scared abou this. Her sister who also had a massive stroke was found. Pain is happening off an on for several weeks. Last had on Friday. Primary MD is [facility]. She has hx of CVA, hyperlipidemia, and HTN. She is wihtout chest pain now. Pushing on her chest produces some discomfort but does not duplicate the pain she has been having.  She is also having swelling of her right leg that is worse in the past month. She claims to have hx of chronic swelling of the right leg that comes and goes and has done so for months.  Preventive Screening-Counseling & Management  Alcohol-Tobacco     Alcohol drinks/day: 0     Smoking Status: never     Year Quit: years ago     Passive Smoke Exposure:  no  Caffeine-Diet-Exercise     Caffeine use/day: coffee,tea     Does Patient Exercise: yes     Type of exercise: rehab/therapy     Exercise (avg: min/session): 30-60     Times/week: 3   Current Allergies (reviewed today): ! KEFLEX ! * AVANDIA Past History:  Past Medical History: Last updated: 10/02/2008 Severe lumbar spondylosthesis s.p surgery in February with L4-5 decompressiona and fusion  Surgery on 5/ diabetes,   hypertension hypercholesterolemia  hypothyroidism  osteoarthritis of   the left knee.  She has had a previous right knee replacement for   osteoarthritis.  CVA   Past Surgical History: Last updated: 07/03/2007 July 11, 2001  Right total knee arthroplasty. ]  FEbruary 11th, 2009  1. Bilateral L4 laminotomies.   2. Decompression of the bilateral L4 and L5 nerve roots; L4-5       transforaminal lumbar interbody fusion with local autograft bone       and Actifuse  bone graft extender; insertion of L4-5 interbody       prosthesis (Capstone PEEK interbody prosthesis), L4-5 posterior non-       segmental instrumentation with legacy titanium plates, screws, and       rods; L4-5 posterolateral arthrodesis with local morselized       autograft bone and Actifuse bone graft extender.  05/20/07  1. Exploration of lumbar wound/arthrodesis.   2. Bilateral L 4 laminotomies, foraminotomies  to decompress the       bilateral L5 nerve roots.   3. Epidural cultures.   Family History: Last updated: 02/10/2009 no early coronary artery disease, sister with massive cva  Social History: Last updated: 10/02/2008 Never Smoked Alcohol use-no Drug use-no Daughter living with pt but daughter works most of the day  Risk Factors: Alcohol Use: 0 (02/10/2009) Caffeine Use: coffee,tea (02/10/2009) Exercise: yes (02/10/2009)  Risk Factors: Smoking Status: never (02/10/2009) Passive Smoke Exposure: no (02/10/2009)  Family History: Reviewed history from 07/03/2007 and no  changes required. no early coronary artery disease, sister with massive cva  Social History: Reviewed history from 10/02/2008 and no changes required. Never Smoked Alcohol use-no Drug use-no Daughter living with pt but daughter works most of the day  Review of Systems       The patient complains of chest pain and difficulty walking.  The patient denies anorexia, fever, weight loss, weight gain, vision loss, decreased hearing, hoarseness, syncope, dyspnea on exertion, peripheral edema, prolonged cough, headaches, hemoptysis, abdominal pain, melena, hematochezia, severe indigestion/heartburn, hematuria, incontinence, genital sores, muscle weakness, suspicious skin lesions, transient blindness, depression, unusual weight change, abnormal bleeding, enlarged lymph nodes, and breast masses.    Vital Signs:  Patient profile:   70 year old female Menstrual status:  postmenopausal Height:      67 inches (170.18 cm) Weight:      257 pounds (116.82 kg) BMI:     40.40 Temp:     97.2 degrees F (36.22 degrees C) oral Pulse rate:   79 / minute BP sitting:   161 / 76  (left arm) Cuff size:   large  Vitals Entered By: Jarrett Ables CMA (February 10, 2009 9:49 AM) CC: F/U Is Patient Diabetic? Yes Did you bring your meter with you today? No Pain Assessment Patient in pain? no      Nutritional Status BMI of > 30 = obese Nutritional Status Detail NL  Does patient need assistance? Functional Status Self care Ambulation Normal   Physical Exam  General:  alert and overweight-appearing.  Head:  normocephalic and atraumatic.   Eyes:  vision grossly intact, pupils equal, pupils round, and pupils reactive to light.   Ears:  no external deformities.   Nose:  no external deformity.   Mouth:  pharynx pink and moist, no erythema, and no exudates.   Chest Wall:  some tenderness with palpation of her sternum but does not reproduce her chest pain she has been having Lungs:  normal respiratory  effort, no crackles, and no wheezes.   Heart:  normal rate, regular rhythm, no murmur, no gallop, no rub, and no JVD.   Abdomen:  soft, non-tender, normal bowel sounds, and no distention.   Msk:  Patient with pain in lumbar area. No palpable deformity.  Extremities:  her right lower extrmity is 38 cm vs 34 on the left. She has 3+ pitting edema on the right, 2+ on the left Neurologic:  alert & oriented X3.  strenght in tact   Impression & Recommendations:  Problem # 1:  CHEST PAIN, ATYPICAL (ICD-786.59) Assessment New HEr EKD shows T wave inversion in III, and is NSR with 75bpm. D dimer is barely positive at .49. Her CXR shows no infiltrates to my read. Her CE are cooking still. She needs CTAngiogram to rule out PE, Doppler sof her Lower extremities. I think she would benefit from admission for rule out MI--though she has no chest pain currently. I have d/w TRiad on  call who prefer for pt to go to ED for monitoring and iniatiion of CTangio, dopplers. Certainly if she is not admitted for cardiac workupp (presuming DVT, PE wokrup negative) she will need prompt plug in to cardiology for risk stratificaiton due to her risk factors. Orders: T-D-Dimer Fibrin Derivatives Quantitive [id number]) T-Troponin I [id number]) CXR- 2view (CXR) 12 Lead EKG (12 Lead EKG) T-CK MB Fract ([id number]) Est. Patient Level V [id number])  Problem # 2:  CVA WITH RIGHT HEMIPARESIS (ICD-438.20)  high risk for CAD (see above) Her updated medication list for this problem includes:    Aggrenox 25-200 Mg Xr12h-cap (Aspirin-dipyridamole)  Orders: Est. Patient Level V [id number])  Problem # 3:  THYROID CANCER, HX OF (ICD-V10.87)  in remission per her oncologist  Orders: Est. Patient Level V [id number])  Problem # 4:  HYPERTENSION NEC (ICD-997.91)  poorly controlled, but defer to her primary  Orders: Est. Patient Level V [id number])  Problem # 5:  OTH INFS INVLV BONE DZ CLASS ELSW OTH SPEC SITES (ICD-730.88) check esr,  anc crp, continue chronic doxycyline The following medications were removed from the medication c    Percocet 5-325 Mg Tabs (Oxycodone-acetaminophen) Her updated medication list for this problem includes:    Doryx 100 Mg Tbec (Doxycycline hyclate) .Marland Kitchen... Take 1 tablet by mouth two times a day (until dr. Lucianne Lei dam says to stop this)    Tylenol With Codeine #3 300-30 Mg Tabs (Acetaminophen-codeine) .Marland Kitchen... 1 every 6 hours  Orders: T-Comprehensive Metabolic Panel ([id number]) T-CBC w/Diff [id number]) T-Sed Rate (Automated) [id number]) T-C-Reactive Protein [id number]) Est. Patient Level V [id number])  Problem # 6:  EPIDURAL ABSCESS (ICD-324.9)  resolved, continue doxy for chronic suppresion of osteo as above  Orders: Est. Patient Level V [id number])  Medications Added to Medication List This Visit: 1)  Synthroid 137 Mcg Tabs (Levothyroxine sodium) .Marland Kitchen.. 1 once daily 2)  Tylenol With Codeine #3 300-30 Mg Tabs (Acetaminophen-codeine) .Marland Kitchen.. 1 every 6 hours  Patient Instructions: 1)  We are checking blood work, ekg cxr 2)  please stay until we have results back and Ive talked to a cardioogist 3)  YOu may have to come into th ehospital 4)  Make fu with Dr. Tommy Medal in APril Process Orders Check Orders Results:     [facility]: Check successful Tests Sent for requisitioning (February 10, 2009 12:41 PM):     02/10/2009: [facility] -- T-Comprehensive Metabolic Panel [id number] (signed)     02/10/2009: [facility] -- T-CBC w/Diff [id number] (signed)     02/10/2009: [facility] -- T-D-Dimer Fibrin Derivatives Quantitive [id number] (signed)     02/10/2009: [facility] -- T-Troponin I [id number] (signed)     02/10/2009: [facility] -- T-Sed Rate (Automated) [id number] (signed)     02/10/2009: [facility] -- T-C-Reactive Protein [id number] (signed)     02/10/2009: [facility] -- T-CK MB Fract [id number] (signed)   Process Orders Check Orders Results:     [facility]: Check successful Tests Sent for requisitioning (February 10, 2009 12:41 PM):     02/10/2009: [facility] -- T-Comprehensive Metabolic Panel [id number] (signed)     02/10/2009: [facility] -- T-CBC w/Diff [id number] (signed)     02/10/2009: [facility] -- T-D-Dimer Fibrin Derivatives Quantitive [phone] (signed)     02/10/2009: [facility] -- T-Troponin I [id number] (signed)     02/10/2009: [facility] -- T-Sed Rate (Automated) [id number] (signed)  02/10/2009: [facility] -- T-C-Reactive Protein [phone] (signed)     02/10/2009: [facility] -- T-CK MB Fract [phone] (signed)

## 2010-02-05 NOTE — Consult Note (Signed)
Summary: [facility]  [facility]   Imported By: Bonner Puna 10/28/2009 10:20:44  _____________________________________________________________________  External Attachment:    Type:   Image     Comment:   External Document

## 2010-02-05 NOTE — Assessment & Plan Note (Signed)
Summary: F/U OV/VS   Visit Type:  Follow-up Primary Provider:  dewey   History of Present Illness: 70 year old AA lady is s.p L4-5 decompression and fusion by Dr. [name] in February 2009 for severe spondylolisthesis, spinal stenosis with severe lower extremity pain. Pt then developed L3-4 diskitis, and epidural and paraspinal fluid collections in May 2009 when she underwent I and D by Dr. [name]. Her cultures on oral antibiotics failed to yield an organism. She was treated with aztreonama nd vancomycin, and later changed to oral suppressive rifampin and doxycycline. She has now been on doxycyline alone.  Since I last saw her--when she had chest pain, she has a complete cardiovascular workup incluidng cardiac cath which was clean. She is haivng worsening back pain that occurs more with bearing weight and changing position but also at rest. She has had no fevers or chills. She is being considered by Dr. Theda Sers for joint replacement. Dr. [name] has ordereed a myelogram to workup her back pain further.Dr. Theda Sers  Problems Prior to Update: 1)  Thyroid Cancer, Hx of  (ICD-V10.87) 2)  Chest Pain, Atypical  (ICD-786.59) 3)  Nausea  (ICD-787.02) 4)  Back Pain, Acute  (ICD-724.5) 5)  Hypertension Nec  (ICD-997.91) 6)  Cva With Right Hemiparesis  (ICD-438.20) 7)  Chest Pain Unspecified  (ICD-786.50) 8)  Leukocytosis Unspecified  (ICD-288.60) 9)  Hypothyroidism  (ICD-244.9) 10)  Benign Essential Hypertension Previous Ppc  (ICD-642.04) 11)  Dm  (ICD-250.00) 12)  Epidural Abscess  (ICD-324.9) 13)  Psoas Muscle Abscess  (ICD-567.31) 14)  Oth Infs Invlv Bone Dz Class Elsw Oth Spec Sites  (ICD-730.88)  Medications Prior to Update: 1)  Valium 5 Mg  Tabs (Diazepam) 2)  Zofran 4 Mg  Tabs (Ondansetron Hcl) 3)  Maalox 600 Mg  Chew (Calcium Carbonate Antacid) 4)  Senokot 8.6 Mg  Tabs (Sennosides) 5)  Ambien 5 Mg  Tabs (Zolpidem Tartrate) 6)  Synthroid 137 Mcg Tabs (Levothyroxine Sodium) .Marland Kitchen.. 1 Once  Daily 7)  Novolog 100 Unit/ml  Soln (Insulin Aspart) 8)  Doryx 100 Mg  Tbec (Doxycycline Hyclate) .... Take 1 Tablet By Mouth Two Times A Day (Until Dr. Tommy Medal Says To Stop This) 9)  Aggrenox 25-200 Mg Xr12h-Cap (Aspirin-Dipyridamole) 10)  Tylenol With Codeine #3 300-30 Mg Tabs (Acetaminophen-Codeine) .Marland Kitchen.. 1 Every 6 Hours  Current Medications (verified): 1)  Valium 5 Mg  Tabs (Diazepam) 2)  Zofran 4 Mg  Tabs (Ondansetron Hcl) 3)  Maalox 600 Mg  Chew (Calcium Carbonate Antacid) 4)  Senokot 8.6 Mg  Tabs (Sennosides) 5)  Ambien 5 Mg  Tabs (Zolpidem Tartrate) 6)  Synthroid 137 Mcg Tabs (Levothyroxine Sodium) .Marland Kitchen.. 1 Once Daily 7)  Novolog 100 Unit/ml  Soln (Insulin Aspart) 8)  Doryx 100 Mg  Tbec (Doxycycline Hyclate) .... Take 1 Tablet By Mouth Two Times A Day (Until Dr. Tommy Medal Says To Stop This) 9)  Aggrenox 25-200 Mg Xr12h-Cap (Aspirin-Dipyridamole) 10)  Tylenol With Codeine #3 300-30 Mg Tabs (Acetaminophen-Codeine) .Marland Kitchen.. 1 Every 6 Hours 11)  Crestor 5 Mg Tabs (Rosuvastatin Calcium) .... Take One Tablet By Mouth Daily 12)  Lexapro 10 Mg Tabs (Escitalopram Oxalate) .... Take One Tablet By Mouth Daily  Allergies: 1)  ! Keflex 2)  ! * Avandia    Current Allergies (reviewed today): ! KEFLEX ! * AVANDIA Review of Systems  The patient denies anorexia, fever, weight loss, weight gain, vision loss, decreased hearing, hoarseness, chest pain, syncope, dyspnea on exertion, peripheral edema, prolonged cough, headaches, hemoptysis, abdominal  pain, melena, hematochezia, severe indigestion/heartburn, hematuria, incontinence, genital sores, muscle weakness, suspicious skin lesions, transient blindness, difficulty walking, depression, unusual weight change, abnormal bleeding, and enlarged lymph nodes.    Vital Signs:  Patient profile:   70 year old female Menstrual status:  postmenopausal Height:      67 inches (170.18 cm) Weight:      256.4 pounds (116.55 kg) BMI:     40.30  Vitals Entered By:  Sherrie George [facility]) (August 07, 2009 2:49 PM)  Physical Exam  General:  alert, well-developed, and well-nourished.   Head:  normocephalic, atraumatic, and no abnormalities observed.   Eyes:  vision grossly intact, pupils equal, and pupils round.   Ears:  no external deformities.   Nose:  no external deformity.   Mouth:  pharynx pink and moist, no erythema, and no exudates.   Lungs:  normal respiratory effort, no crackles, and no wheezes.   Heart:  normal rate, regular rhythm, no murmur, no gallop, no rub, and no JVD.   Abdomen:  soft, non-tender, normal bowel sounds, and no distention.   Msk:  Patient with pain in lumbar area. No palpable deformity.  Neurologic:  alert & oriented X3.  strenght in tact Skin:  no rashes.   Psych:  Oriented X3 and memory intact for recent and remote.     Impression & Recommendations:  Problem # 1:  OTH INFS INVLV BONE DZ CLASS ELSW OTH SPEC SITES (ICD-730.88) I will check esr and crp. I will continue her on doxy for now and will await results from myelogram as well Her updated medication list for this problem includes:    Doryx 100 Mg Tbec (Doxycycline hyclate) .Marland Kitchen... Take 1 tablet by mouth two times a day (until dr. Lucianne Lei dam says to stop this)    Tylenol With Codeine #3 300-30 Mg Tabs (Acetaminophen-codeine) .Marland Kitchen... 1 every 6 hours  Orders: T-CBC w/Diff ([id number]) T-Basic Metabolic Panel ([id number]) T-C-Reactive Protein [phone]) T-Sed Rate (Automated) [id number]) Est. Patient Level IV [id number])  Problem # 2:  PSOAS MUSCLE ABSCESS (ICD-567.31)  resolved but styinag on doxy  Orders: Est. Patient Level IV [id number])  Problem # 3:  CHEST PAIN, ATYPICAL (ICD-786.59)  cardiac cath was normal  Orders: Est. Patient Level IV [id number])  Problem # 4:  EPIDURAL ABSCESS (ICD-324.9)  see above discussion  Orders: Est. Patient Level IV [id number])  Problem # 5:  BACK PAIN, ACUTE (ICD-724.5) she is getting myelogram Her updated medication list  for this problem includes:    Tylenol With Codeine #3 300-30 Mg Tabs (Acetaminophen-codeine) .Marland Kitchen... 1 every 6 hours  Problem # 6:  OSTEOARTHRITIS, KNEES, BILATERAL (ICD-715.96)  pt is haivng TKA contemplated by Dr. Theda Sers. I would not recommend surgery until more of issues with the back have cleared up. If she has surgery I would consider decontaminating her with hibiclenz and mupirocin in 5 days prior to surgery even if PCR for MRSA , MSSA is negative. Her updated medication list for this problem includes:    Tylenol With Codeine #3 300-30 Mg Tabs (Acetaminophen-codeine) .Marland Kitchen... 1 every 6 hours  Orders: Est. Patient Level IV [id number])  Medications Added to Medication List This Visit: 1)  Crestor 5 Mg Tabs (Rosuvastatin calcium) .... Take one tablet by mouth daily 2)  Lexapro 10 Mg Tabs (Escitalopram oxalate) .... Take one tablet by mouth daily  Patient Instructions: 1)  rtc to see Dr. Tommy Medal in 2 months Prescriptions: DORYX 100 MG  TBEC (DOXYCYCLINE HYCLATE) Take 1 tablet by  mouth two times a day (until Dr. Tommy Medal says to stop this)  #62 Tablet x 11   Entered and Authorized by:   Alcide Evener MD   Signed by:   Rhina Brackett Dam MD on 08/07/2009   Method used:   Electronically to        [facility] (retail)       [address]       [place], [place]  [address]       Ph: [phone] or [phone]       Fax: [phone]   RxID:   [id number]

## 2010-02-05 NOTE — Consult Note (Signed)
Summary: [facility]   Imported By: Bonner Puna 02/27/2009 14:55:28  _____________________________________________________________________  External Attachment:    Type:   Image     Comment:   External Document

## 2010-02-17 ENCOUNTER — Encounter: Payer: Self-pay | Admitting: Infectious Disease

## 2010-02-25 NOTE — Consult Note (Signed)
Summary: [facility] 11/10/09  [facility] 11/10/09   Imported By: Bonner Puna 02/17/2010 17:56:36  _____________________________________________________________________  External Attachment:    Type:   Image     Comment:   External Document

## 2010-03-06 ENCOUNTER — Other Ambulatory Visit: Payer: Self-pay | Admitting: Gastroenterology

## 2010-03-06 DIAGNOSIS — Q438 Other specified congenital malformations of intestine: Secondary | ICD-10-CM

## 2010-03-19 LAB — POCT URINALYSIS DIPSTICK
Nitrite: NEGATIVE
Urobilinogen, UA: 0.2 mg/dL (ref 0.0–1.0)
pH: 5 (ref 5.0–8.0)

## 2010-03-24 ENCOUNTER — Encounter: Payer: Self-pay | Admitting: *Deleted

## 2010-03-25 LAB — BASIC METABOLIC PANEL
BUN: 12 mg/dL (ref 6–23)
CO2: 28 mEq/L (ref 19–32)
Chloride: 102 mEq/L (ref 96–112)
Creatinine, Ser: 0.74 mg/dL (ref 0.4–1.2)
GFR calc Af Amer: >60 mL/min (ref >60)
Potassium: 3.7 mEq/L (ref 3.5–5.1)

## 2010-03-25 LAB — DIFFERENTIAL
Basophils Relative: 0 % (ref 0–1)
Eosinophils Absolute: 0.2 10*3/uL (ref 0.0–0.7)
Monocytes Relative: 3 % (ref 3–12)
Neutro Abs: 10.4 10*3/uL — ABNORMAL HIGH (ref 1.7–7.7)
Neutrophils Relative %: 84 % — ABNORMAL HIGH (ref 43–77)

## 2010-03-25 LAB — POCT CARDIAC MARKERS
CKMB, poc: 1.6 ng/mL (ref 1.0–8.0)
Myoglobin, poc: 61.5 ng/mL (ref 12–200)

## 2010-03-25 LAB — CBC
HCT: 31.3 % — ABNORMAL LOW (ref 36.0–46.0)
MCHC: 33.9 g/dL (ref 30.0–36.0)
MCV: 92.9 fL (ref 78.0–100.0)
RBC: 3.37 MIL/uL — ABNORMAL LOW (ref 3.87–5.11)
WBC: 12.5 10*3/uL — ABNORMAL HIGH (ref 4.0–10.5)

## 2010-03-29 LAB — POCT I-STAT GLUCOSE
Glucose, Bld: 107 mg/dL — ABNORMAL HIGH (ref 70–99)
Operator id: 141321

## 2010-04-06 ENCOUNTER — Other Ambulatory Visit: Payer: Self-pay | Admitting: Gastroenterology

## 2010-04-06 ENCOUNTER — Ambulatory Visit
Admission: RE | Admit: 2010-04-06 | Discharge: 2010-04-06 | Disposition: A | Discharge status: Home / Self Care | Payer: Medicare Other | Source: Ambulatory Visit | Attending: Gastroenterology | Admitting: Gastroenterology

## 2010-04-06 DIAGNOSIS — Q438 Other specified congenital malformations of intestine: Secondary | ICD-10-CM

## 2010-04-09 LAB — GLUCOSE, CAPILLARY
Glucose-Capillary: 109 mg/dL — ABNORMAL HIGH (ref 70–99)
Glucose-Capillary: 128 mg/dL — ABNORMAL HIGH (ref 70–99)
Glucose-Capillary: 135 mg/dL — ABNORMAL HIGH (ref 70–99)
Glucose-Capillary: 137 mg/dL — ABNORMAL HIGH (ref 70–99)
Glucose-Capillary: 155 mg/dL — ABNORMAL HIGH (ref 70–99)
Glucose-Capillary: 187 mg/dL — ABNORMAL HIGH (ref 70–99)
Glucose-Capillary: 187 mg/dL — ABNORMAL HIGH (ref 70–99)
Glucose-Capillary: 202 mg/dL — ABNORMAL HIGH (ref 70–99)
Glucose-Capillary: 99 mg/dL (ref 70–99)

## 2010-04-09 LAB — CALCIUM
Calcium: 7.5 mg/dL — ABNORMAL LOW (ref 8.4–10.5)
Calcium: 7.7 mg/dL — ABNORMAL LOW (ref 8.4–10.5)
Calcium: 7.8 mg/dL — ABNORMAL LOW (ref 8.4–10.5)
Calcium: 7.9 mg/dL — ABNORMAL LOW (ref 8.4–10.5)

## 2010-04-09 LAB — BASIC METABOLIC PANEL WITH GFR
BUN: 10 mg/dL (ref 6–23)
CO2: 28 meq/L (ref 19–32)
Calcium: 9.1 mg/dL (ref 8.4–10.5)
Chloride: 106 meq/L (ref 96–112)
Creatinine, Ser: 0.78 mg/dL (ref 0.4–1.2)
GFR calc non Af Amer: >60 mL/min
Glucose, Bld: 138 mg/dL — ABNORMAL HIGH (ref 70–99)
Potassium: 4.7 meq/L (ref 3.5–5.1)
Sodium: 143 meq/L (ref 135–145)

## 2010-04-09 LAB — DIFFERENTIAL
Basophils Absolute: 0 10*3/uL (ref 0.0–0.1)
Basophils Relative: 0 % (ref 0–1)
Eosinophils Absolute: 0.2 10*3/uL (ref 0.0–0.7)
Eosinophils Relative: 2 % (ref 0–5)
Neutrophils Relative %: 80 % — ABNORMAL HIGH (ref 43–77)

## 2010-04-09 LAB — URINE MICROSCOPIC-ADD ON

## 2010-04-09 LAB — CBC
HCT: 34.5 % — ABNORMAL LOW (ref 36.0–46.0)
MCHC: 33.7 g/dL (ref 30.0–36.0)
MCV: 90.1 fL (ref 78.0–100.0)
Platelets: 331 10*3/uL (ref 150–400)
RDW: 13.8 % (ref 11.5–15.5)
WBC: 13.2 10*3/uL — ABNORMAL HIGH (ref 4.0–10.5)

## 2010-04-09 LAB — URINALYSIS, ROUTINE W REFLEX MICROSCOPIC
Bilirubin Urine: NEGATIVE
Glucose, UA: NEGATIVE mg/dL
Hgb urine dipstick: NEGATIVE
Ketones, ur: 15 mg/dL — AB
Nitrite: NEGATIVE
Protein, ur: NEGATIVE mg/dL
Specific Gravity, Urine: 1.021 (ref 1.005–1.030)
Urobilinogen, UA: 0.2 mg/dL (ref 0.0–1.0)
pH: 5.5 (ref 5.0–8.0)

## 2010-04-09 LAB — HEMOGLOBIN A1C
Hgb A1c MFr Bld: 6.2 % — ABNORMAL HIGH (ref 4.6–6.1)
Mean Plasma Glucose: 131 mg/dL

## 2010-04-09 LAB — PROTIME-INR
INR: 0.95 (ref 0.00–1.49)
Prothrombin Time: 12.6 s (ref 11.6–15.2)

## 2010-04-09 LAB — URINE CULTURE: Culture: NO GROWTH

## 2010-04-10 LAB — CULTURE, BLOOD (ROUTINE X 2)

## 2010-04-10 LAB — CBC
MCV: 90.4 fL (ref 78.0–100.0)
Platelets: 296 10*3/uL (ref 150–400)
RBC: 3.66 MIL/uL — ABNORMAL LOW (ref 3.87–5.11)
WBC: 11 10*3/uL — ABNORMAL HIGH (ref 4.0–10.5)

## 2010-04-10 LAB — COMPREHENSIVE METABOLIC PANEL
ALT: 12 U/L (ref 0–35)
AST: 13 U/L (ref 0–37)
Albumin: 3.8 g/dL (ref 3.5–5.2)
Alkaline Phosphatase: 53 U/L (ref 39–117)
Chloride: 103 mEq/L (ref 96–112)
GFR calc Af Amer: >60 mL/min (ref >60)
Potassium: 3.9 mEq/L (ref 3.5–5.1)
Sodium: 141 mEq/L (ref 135–145)
Total Bilirubin: 0.4 mg/dL (ref 0.3–1.2)
Total Protein: 7 g/dL (ref 6.0–8.3)

## 2010-04-10 LAB — MRSA CULTURE

## 2010-04-10 LAB — DIFFERENTIAL
Basophils Relative: 0 % (ref 0–1)
Eosinophils Absolute: 0.2 10*3/uL (ref 0.0–0.7)
Eosinophils Relative: 2 % (ref 0–5)
Lymphs Abs: 1.5 10*3/uL (ref 0.7–4.0)
Monocytes Absolute: 0.6 10*3/uL (ref 0.1–1.0)
Monocytes Relative: 5 % (ref 3–12)
Neutrophils Relative %: 79 % — ABNORMAL HIGH (ref 43–77)

## 2010-04-10 LAB — GLUCOSE, CAPILLARY
Glucose-Capillary: 109 mg/dL — ABNORMAL HIGH (ref 70–99)
Glucose-Capillary: 153 mg/dL — ABNORMAL HIGH (ref 70–99)
Glucose-Capillary: 94 mg/dL (ref 70–99)
Glucose-Capillary: 98 mg/dL (ref 70–99)

## 2010-04-10 LAB — CK: Total CK: 85 U/L (ref 7–177)

## 2010-04-12 LAB — COMPREHENSIVE METABOLIC PANEL
ALT: 15 U/L (ref 0–35)
Albumin: 3.1 g/dL — ABNORMAL LOW (ref 3.5–5.2)
Alkaline Phosphatase: 52 U/L (ref 39–117)
Calcium: 7.4 mg/dL — ABNORMAL LOW (ref 8.4–10.5)
GFR calc Af Amer: >60 mL/min (ref >60)
Potassium: 3.6 mEq/L (ref 3.5–5.1)
Sodium: 142 mEq/L (ref 135–145)
Total Protein: 5.8 g/dL — ABNORMAL LOW (ref 6.0–8.3)

## 2010-04-12 LAB — CBC
HCT: 34 % — ABNORMAL LOW (ref 36.0–46.0)
MCHC: 33.6 g/dL (ref 30.0–36.0)
Platelets: 254 10*3/uL (ref 150–400)
Platelets: 293 10*3/uL (ref 150–400)
RDW: 13.6 % (ref 11.5–15.5)
RDW: 13.8 % (ref 11.5–15.5)

## 2010-04-12 LAB — GLUCOSE, CAPILLARY
Glucose-Capillary: 124 mg/dL — ABNORMAL HIGH (ref 70–99)
Glucose-Capillary: 128 mg/dL — ABNORMAL HIGH (ref 70–99)
Glucose-Capillary: 155 mg/dL — ABNORMAL HIGH (ref 70–99)
Glucose-Capillary: 205 mg/dL — ABNORMAL HIGH (ref 70–99)

## 2010-04-12 LAB — CK TOTAL AND CKMB (NOT AT ARMC)
CK, MB: 1.7 ng/mL (ref 0.3–4.0)
Relative Index: 1.5 (ref 0.0–2.5)
Total CK: 113 U/L (ref 7–177)

## 2010-04-12 LAB — LIPID PANEL
HDL: 58 mg/dL (ref >39)
LDL Cholesterol: 85 mg/dL (ref 0–99)
Total CHOL/HDL Ratio: 2.9 RATIO
Triglycerides: 117 mg/dL (ref <150)
VLDL: 23 mg/dL (ref 0–40)

## 2010-04-12 LAB — CARDIAC PANEL(CRET KIN+CKTOT+MB+TROPI)
CK, MB: 1.7 ng/mL (ref 0.3–4.0)
CK, MB: 1.8 ng/mL (ref 0.3–4.0)
Relative Index: 1.5 (ref 0.0–2.5)
Relative Index: 1.5 (ref 0.0–2.5)
Total CK: 110 U/L (ref 7–177)
Troponin I: 0.01 ng/mL (ref 0.00–0.06)

## 2010-04-12 LAB — PROTIME-INR
INR: 1.1 (ref 0.00–1.49)
Prothrombin Time: 13 seconds (ref 11.6–15.2)

## 2010-04-12 LAB — BASIC METABOLIC PANEL
BUN: 10 mg/dL (ref 6–23)
BUN: 11 mg/dL (ref 6–23)
CO2: 30 mEq/L (ref 19–32)
Calcium: 7.8 mg/dL — ABNORMAL LOW (ref 8.4–10.5)
Calcium: 8.1 mg/dL — ABNORMAL LOW (ref 8.4–10.5)
Chloride: 105 mEq/L (ref 96–112)
Creatinine, Ser: 0.61 mg/dL (ref 0.4–1.2)
GFR calc Af Amer: >60 mL/min (ref >60)
GFR calc non Af Amer: >60 mL/min (ref >60)
Glucose, Bld: 86 mg/dL (ref 70–99)

## 2010-04-12 LAB — URINALYSIS, ROUTINE W REFLEX MICROSCOPIC
Bilirubin Urine: NEGATIVE
Ketones, ur: NEGATIVE mg/dL
Nitrite: NEGATIVE
Urobilinogen, UA: 0.2 mg/dL (ref 0.0–1.0)

## 2010-04-12 LAB — DIFFERENTIAL
Basophils Absolute: 0 10*3/uL (ref 0.0–0.1)
Basophils Relative: 0 % (ref 0–1)
Eosinophils Relative: 1 % (ref 0–5)
Lymphocytes Relative: 13 % (ref 12–46)
Neutro Abs: 9.8 10*3/uL — ABNORMAL HIGH (ref 1.7–7.7)

## 2010-04-12 LAB — POCT CARDIAC MARKERS

## 2010-04-12 LAB — RPR: RPR Ser Ql: NONREACTIVE

## 2010-04-12 LAB — VITAMIN B12: Vitamin B-12: 531 pg/mL (ref 211–911)

## 2010-04-12 LAB — APTT: aPTT: 34 seconds (ref 24–37)

## 2010-04-15 LAB — POCT CARDIAC MARKERS
CKMB, poc: 1.3 ng/mL (ref 1.0–8.0)
Myoglobin, poc: 74.2 ng/mL (ref 12–200)
Troponin i, poc: <0.05 ng/mL (ref 0.00–0.09)

## 2010-05-05 ENCOUNTER — Emergency Department (HOSPITAL_COMMUNITY): Payer: Medicare Other

## 2010-05-05 ENCOUNTER — Ambulatory Visit (INDEPENDENT_AMBULATORY_CARE_PROVIDER_SITE_OTHER): Payer: Medicare Other | Admitting: Infectious Disease

## 2010-05-05 ENCOUNTER — Encounter: Payer: Self-pay | Admitting: Infectious Disease

## 2010-05-05 ENCOUNTER — Emergency Department (HOSPITAL_COMMUNITY)
Admission: EM | Admit: 2010-05-05 | Discharge: 2010-05-05 | Disposition: A | Discharge status: Home / Self Care | Payer: Medicare Other | Attending: Emergency Medicine | Admitting: Emergency Medicine

## 2010-05-05 DIAGNOSIS — Z794 Long term (current) use of insulin: Secondary | ICD-10-CM | POA: Insufficient documentation

## 2010-05-05 DIAGNOSIS — I69959 Hemiplegia and hemiparesis following unspecified cerebrovascular disease affecting unspecified side: Secondary | ICD-10-CM

## 2010-05-05 DIAGNOSIS — M869 Osteomyelitis, unspecified: Secondary | ICD-10-CM

## 2010-05-05 DIAGNOSIS — Z79899 Other long term (current) drug therapy: Secondary | ICD-10-CM | POA: Insufficient documentation

## 2010-05-05 DIAGNOSIS — Z8673 Personal history of transient ischemic attack (TIA), and cerebral infarction without residual deficits: Secondary | ICD-10-CM | POA: Insufficient documentation

## 2010-05-05 DIAGNOSIS — E119 Type 2 diabetes mellitus without complications: Secondary | ICD-10-CM | POA: Insufficient documentation

## 2010-05-05 DIAGNOSIS — I10 Essential (primary) hypertension: Secondary | ICD-10-CM | POA: Insufficient documentation

## 2010-05-05 DIAGNOSIS — R079 Chest pain, unspecified: Secondary | ICD-10-CM

## 2010-05-05 DIAGNOSIS — G8929 Other chronic pain: Secondary | ICD-10-CM | POA: Insufficient documentation

## 2010-05-05 DIAGNOSIS — R0789 Other chest pain: Secondary | ICD-10-CM

## 2010-05-05 DIAGNOSIS — E669 Obesity, unspecified: Secondary | ICD-10-CM | POA: Insufficient documentation

## 2010-05-05 DIAGNOSIS — M549 Dorsalgia, unspecified: Secondary | ICD-10-CM | POA: Insufficient documentation

## 2010-05-05 DIAGNOSIS — E039 Hypothyroidism, unspecified: Secondary | ICD-10-CM | POA: Insufficient documentation

## 2010-05-05 DIAGNOSIS — R609 Edema, unspecified: Secondary | ICD-10-CM | POA: Insufficient documentation

## 2010-05-05 LAB — BASIC METABOLIC PANEL
BUN: 20 mg/dL (ref 6–23)
Chloride: 101 mEq/L (ref 96–112)
GFR calc non Af Amer: 54 mL/min — ABNORMAL LOW (ref >60)
Glucose, Bld: 101 mg/dL — ABNORMAL HIGH (ref 70–99)
Potassium: 3.9 mEq/L (ref 3.5–5.1)
Sodium: 139 mEq/L (ref 135–145)

## 2010-05-05 LAB — DIFFERENTIAL
Eosinophils Absolute: 0.1 10*3/uL (ref 0.0–0.7)
Eosinophils Relative: 1 % (ref 0–5)
Lymphocytes Relative: 12 % (ref 12–46)
Lymphs Abs: 1.7 10*3/uL (ref 0.7–4.0)
Monocytes Absolute: 0.7 10*3/uL (ref 0.1–1.0)
Monocytes Relative: 5 % (ref 3–12)

## 2010-05-05 LAB — POCT CARDIAC MARKERS
CKMB, poc: 1.2 ng/mL (ref 1.0–8.0)
CKMB, poc: 1.2 ng/mL (ref 1.0–8.0)
Troponin i, poc: <0.05 ng/mL (ref 0.00–0.09)

## 2010-05-05 LAB — CBC
HCT: 30.7 % — ABNORMAL LOW (ref 36.0–46.0)
MCH: 28.9 pg (ref 26.0–34.0)
MCHC: 31.6 g/dL (ref 30.0–36.0)
MCV: 91.4 fL (ref 78.0–100.0)
Platelets: 291 10*3/uL (ref 150–400)
RDW: 13.1 % (ref 11.5–15.5)
WBC: 14.2 10*3/uL — ABNORMAL HIGH (ref 4.0–10.5)

## 2010-05-05 NOTE — Assessment & Plan Note (Signed)
No evidence of recurrent CVA

## 2010-05-05 NOTE — Progress Notes (Signed)
Subjective:    Patient ID: [name], female    DOB: July 02, 1940, 70 y.o.   MRN: [id number]  HPI  70 year old AA lady is s.p L4-5 decompression and fusion by Dr. [name] in February 2009 for severe spondylolisthesis, spinal stenosis with severe lower extremity pain. Pt then developed L3-4 diskitis, and epidural and paraspinal fluid collections in May 2009 when she underwent I and D by Dr. [name]. Her cultures on oral antibiotics failed to yield an organism. She was treated with aztreonama nd vancomycin, and later changed to oral suppressive rifampin and doxycycline. She has now been on doxycyline alone. She presents today for routine labs. Patient developed chest pain last night. Chest pain is left side, is sharp and then dull and radiates to her left shoulder and down her left arm. This was 10/10 last night and now is 8/10 It lasted throughout the night. Denies nausea. She took antacid without relief. She burped and this helped slightly. She has minimal back pain and no fevers. Of note we had her worked up extensively for chest pain including a cardiac cath which was completely clean and pt was seen by Dr. Martinique. I spent over 45 mintutes with pt including greater than 50% of time in face to face counselling and coordination of care  Review of Systems  Constitutional: Negative for fever, chills, activity change, appetite change, fatigue and unexpected weight change.  HENT: Negative for facial swelling, neck pain, neck stiffness and ear discharge.   Eyes: Negative for discharge and itching.  Respiratory: Positive for chest tightness. Negative for apnea, cough, choking, shortness of breath, wheezing and stridor.   Cardiovascular: Positive for chest pain. Negative for palpitations and leg swelling.  Gastrointestinal: Negative for abdominal distention.  Genitourinary: Negative for dysuria, frequency, flank pain, enuresis, difficulty urinating and dyspareunia.  Musculoskeletal: Positive for  arthralgias. Negative for back pain, joint swelling and gait problem.  Skin: Negative for color change, pallor, rash and wound.  Neurological: Negative for dizziness, facial asymmetry, light-headedness, numbness and headaches.  Hematological: Negative for adenopathy. Does not bruise/bleed easily.  Psychiatric/Behavioral: Negative for behavioral problems, confusion, decreased concentration and agitation.      Objective:   Physical Exam  Constitutional: She is oriented to person, place, and time. She appears well-developed and well-nourished. No distress.  HENT:  Head: Normocephalic and atraumatic.  Right Ear: External ear normal.  Left Ear: External ear normal.  Mouth/Throat: No oropharyngeal exudate.  Eyes: Conjunctivae and EOM are normal. Pupils are equal, round, and reactive to light. Right eye exhibits no discharge. Left eye exhibits no discharge. No scleral icterus.  Neck: Normal range of motion. Neck supple. No JVD present. No tracheal deviation present.  Cardiovascular: Normal rate, regular rhythm and normal heart sounds.  Exam reveals no gallop and no friction rub.   No murmur heard. Pulmonary/Chest: No respiratory distress. She has no wheezes. She has no rales. She exhibits no tenderness.  Abdominal: She exhibits no distension and no mass. There is no tenderness. There is no rebound and no guarding.  Musculoskeletal: She exhibits edema. She exhibits no tenderness.  Lymphadenopathy:    She has no cervical adenopathy.  Neurological: She is alert and oriented to person, place, and time. She exhibits normal muscle tone. Coordination normal.  Skin: Skin is warm and dry. No rash noted. She is not diaphoretic. No erythema. No pallor.  Psychiatric: She has a normal mood and affect. Her behavior is normal.  Assessment & Plan:  CHEST PAIN, ATYPICAL She has a clean cardiac cath in March 2011. This would make CAD unlikely. However she has multiple risk factors of diabetes, htn,  and has had a CVA. We will give her sublingual nitro here and will bring her to ED for stat cardiac enzymes. PE not terriblyl likely but would consider checking D dimer. If suspicion high obviously CTA  OTH INFS INVLV BONE DZ CLASS ELSW OTH SPEC SITES I would like to check an ESR and CRP. This seems to be doing well and I think we could try her off of her doxycyline but I would like to have her chest pain worked up first before I propose that. rtc in 2 months  CVA WITH RIGHT HEMIPARESIS No evidence of recurrent CVA

## 2010-05-05 NOTE — Assessment & Plan Note (Signed)
She has a clean cardiac cath in March 2011. This would make CAD unlikely. However she has multiple risk factors of diabetes, htn, and has had a CVA. We will give her sublingual nitro here and will bring her to ED for stat cardiac enzymes. PE not terriblyl likely but would consider checking D dimer. If suspicion high obviously CTA

## 2010-05-05 NOTE — Assessment & Plan Note (Signed)
I would like to check an ESR and CRP. This seems to be doing well and I think we could try her off of her doxycyline but I would like to have her chest pain worked up first before I propose that. rtc in 2 months

## 2010-05-19 NOTE — H&P (Signed)
[name], HALLEY NO.:  [id number]   MEDICAL RECORD NO.:  [id number]          PATIENT TYPE:  INP   LOCATION:  3013                         FACILITY:  [facility]   PHYSICIAN:  Estelle June, MD       DATE OF BIRTH:  1940-07-14   DATE OF ADMISSION:  07/24/2008  DATE OF DISCHARGE:                              HISTORY & PHYSICAL   The patient is being admitted to [facility] at  [place]:  Right-sided weakness since yesterday.   HISTORY OF PRESENT ILLNESS:  This is a 70 year old African American  female coming with right-sided upper and lower extremity weakness since  yesterday.  According to the patient, she noticed the weakness  yesterday, but did not go to the ER.  She went to the PCP today and was  sent to the ER.  The patient normally ambulates without a cane, but  since the week started, she was walking around her house with a cane.  She was brought to the ER through an ambulance.  The patient says that  since her weakness started yesterday, it has improved.  The patient  denies any similar episodes in the past.  The patient denies any nausea,  vomiting, shortness of breath, fevers, chills, dizziness, blurred  vision, chest pain, headache, or problems swallowing or speech problems  with the weakness.  The patient has been taking her blood pressure  medications.  Does not check her blood pressure at home.   PAST MEDICAL HISTORY:  1. Diabetes.  2. Hypertension.  3. Hyperlipidemia.  4. Obesity.  5. Hypothyroidism.  6. Spinal stenosis.   FAMILY HISTORY:  Brother with hypertension and 2 TIAs.   SOCIAL HISTORY:  She lives alone, long-time smoker, long time ago was a  smoker, none now.  No alcohol or drugs.   ALLERGIES:  No known allergies.   PHYSICAL EXAMINATION:  VITAL SIGNS:  Temperature of 97.7, pulse 78-82,  respirations 22-16, blood pressure 145/68, 181/76.  GENERAL:  No acute distress.  The  patient is alert and oriented x3.  HEENT/NECK:  No icterus, JVD, or pallor appreciated.  LUNGS:  Clear to auscultation.  CARDIOVASCULAR:  Regular rate and rhythm.  LOWER EXTREMITIES:  No edema noted.  Belly is soft, obese.  NEUROLOGIC:  Cranial nerves are intact.  She does have right upper  extremity weakness 4+/5 and right lower extremity weakness 4+/5.  Gait  was not checked.   LABORATORIES:  Sodium of 142, potassium of 4, chloride 104, bicarb 29,  BUN 10, creatinine 0.58, glucose 86, and calcium 7.8.  INR of 1.  First  set of cardiac enzymes is negative.  Hemoglobin 11.5, crit 34, white  12.1, and platelets 293.  UA is clear.  INR is 1.  Calcium 7.8.  No EKG  that I can find.  We will repeat the EKG.  Chest x-ray, no acute  pathology.  CT of the head shows bilateral microvascular changes with  remote lacunar infarct in the basal ganglia   ASSESSMENT AND PLAN:  Plan is to  admit the patient to a tele bed to do  serial cardiac enzymes to rule her out.  Neurology has been consulted  and we will follow their recommendations.  Per them, blood pressure  control with systolics in the [id number].  We will also do an echo and  Dopplers in the morning along with MRI and MRA of the brain.  The  patient's daughter will bring all her medications in the morning.  We  will do medicine reconciliation at that time.  For blood pressure, we  will continue her lisinopril and we will add metoprolol as needed.  We  will also check her labs, which will include A1c, TSH, B12, and RPR.  We  will hold her metformin while she is in the hospital and we will keep  her on insulin.  The patient will also get PT, OT while she is in the  hospital.      Estelle June, MD  Electronically Signed     RP/MEDQ  D:  07/24/2008  T:  07/25/2008  Job:  [id number]

## 2010-05-19 NOTE — Consult Note (Signed)
NAMEJWANA, [name] NO.:  [id number]   MEDICAL RECORD NO.:  [id number]          PATIENT TYPE:  INP   LOCATION:  3031                         FACILITY:  [facility]   PHYSICIAN:  Girard Cooter, MD         DATE OF BIRTH:  April 18, 1940   DATE OF CONSULTATION:  05/22/2007  DATE OF DISCHARGE:                                 CONSULTATION   REASON FOR CONSULTATION:  Chest pain.   REQUESTING PHYSICIAN:  Dr. Tommy Medal of infectious disease.   HISTORY OF PRESENT ILLNESS:  [name] is a 70 year old pleasant woman  who presented to the hospital status post L4-5 decompression and fusion  and worsening of her pain about two weeks ago. A MRI demonstrated either  epidural enhancement consistent with fibrosis versus possible infection.  Infectious disease was obviously consulted, and the reason I got a  consultation tonight was because the patient experienced some chest pain  that was brief in nature that was resolved with Mylanta. Chest pain was  not pressure like and not associated with any changes in movement. It  was associated with eating lettuce. Cardiac enzymes and  electrocardiogram were ordered. Troponin is so far negative, and her EKG  is also nonfocal, showing normal sinus rhythm, nonspecific ST-T wave  changes. Her chest pain is completely resolved now. She deneis cough,  fevers, or chills.   PAST MEDICAL HISTORY:  Significant for:  1. Gastroesophageal reflux disease.  2. Diabetes type 2.  3. Peripheral neuropathy.  4. As above, epidural abscess infection.   PAST SURGICAL HISTORY:  As above. The patient has had recent exploration  of the lumbar wound arthrodesis and bilateral L4 laminotomies  foraminotomies to decompress the bilateral L5 roots.   SOCIAL HISTORY:  The patient denies drugs, alcohol, or tobacco.   FAMILY HISTORY:  She denies a family history of premature coronary  artery disease.   PHYSICAL EXAMINATION:  HEENT:  Normocephalic, atraumatic. Sclerae  anicteric. PERRLA. Extraocular muscles intact.  NECK:  Supple. No JVD. No carotid bruits.  CARDIOVASCULAR:  S1/S2. Regular rate and rhythm. No murmurs, rubs,  clicks.  ABDOMEN:  Soft, nontender, nondistended. Positive bowel sounds.  LUNGS:  Clear to auscultation bilaterally. No rhonchi, rales or wheezes.  EXTREMITIES:  No clubbing, cyanosis, or edema.   Chest x-ray shows chronic changes of chest without any cardiopulmonary  disease, stable cardiomegaly.   LABORATORY DATA:  Again, troponin is negative x1.   ASSESSMENT AND PLAN:  Reason for consultation was chest pain. Chest pain  has now resolved with mylanta. I agree with the plan to monitor cardiac  enzymes x3. Her EKG is normal. His chest pain is very like atypical, and  it was relieved with Mylanta. I would initiate Protonix 40 mg p.o.  b.i.d., Mylanta as needed. Follow cardiac enzymes. Deep venous  thrombosis prophylaxis per surgery, given that  Two-D echocardiogram  given EKG showing possible left ventricular hypertrophy. Aspirin 81 mg  daily. Her blood pressure is quite well controlled. I would optimize her  diabetic care, check a hemoglobin A1c. Given that her chest pain is very  atypical, we will go ahead and sign off if work-up negative.      Girard Cooter, MD  Electronically Signed     RR/MEDQ  D:  05/22/2007  T:  05/22/2007  Job:  [id number]

## 2010-05-19 NOTE — Op Note (Signed)
[name], [name] NO.:  [id number]   MEDICAL RECORD NO.:  [id number]          PATIENT TYPE:  INP   LOCATION:  3031                         FACILITY:  [facility]   PHYSICIAN:  Ophelia Charter, M.D.DATE OF BIRTH:  [birth year]   DATE OF PROCEDURE:  05/20/2007  DATE OF DISCHARGE:                               OPERATIVE REPORT   BRIEF HISTORY:  The patient is a 70 year old black female who I  performed L4-5 decompression and fusion back in February 2009.  The  patient had some postoperative blood drainage from wound.  She was  treated with p.o. antibiotics and dressing change at this time.  She did  well to about 2 weeks ago when she had increasing pain in her back.  She  was worked up on sed rate which was 28 and a lumbar MRI which  demonstrated epidural enhancement consistent with either fibrosis or  possibly infection.  I discussed various treatment option with the  patient and recommend she undergo an exploration of her wound.  I  explained the surgery to them, the risks, benefits and alternatives.  I  have answered all the patient's and her daughter's questions.  She  agreed to proceed with operation.   PREOPERATIVE DIAGNOSES:  1. Wound Infection.  2. Lumbago.   POSTOPERATIVE DIAGNOSIS:  Lumbar stenosis.   PROCEDURE:  1. Exploration of lumbar wound/arthrodesis.  2. Bilateral L 4 laminotomies, foraminotomies to decompress the      bilateral L5 nerve roots.  3. Epidural cultures.   SURGEON:  Newman Pies, MD   ASSISTANT:  Hosie Spangle, MD   ANESTHESIA:  General endotracheal.   SPECIMENS:  None.   DRAINS:  None.   COMPLICATIONS:  None.   ESTIMATED BLOOD LOSS:  100 mL.   DESCRIPTION OF PROCEDURE:  The patient was brought to the operating room  by anesthesia team and general endotracheal anesthesia was induced.  The  patient was turned to prone position on the [place] frame.  The  lumbosacral region was then prepared with Betadine scrub and  Betadine  solution.  Sterile drapes were applied.  I then incised through the  patient's previous surgical scar.  I used electrocautery to perform a  bilateral subperiosteal dissection exposing the spinous process and  lamina of the L3, L4 and L5.  We exposed the pedicle screws and rods  bilaterally.  We explored the arthrodesis by grasping the rods and  attempting to move them.  They seemed to be firmly seated in the bone  i.e. they did not seem loose.  We did not encounter any obvious purulent  material in fact, they looked just like a normally healing wound.  In  order to better access the dural space and to further decompress the  bilateral L4-L5 nerve roots, I used a high-speed drill to extend the  patient's previous L4 laminotomies in the cephalad direction.  We then  carefully dissected to the epidural fibrosis, identified the underlying  dura and then performed decompression of the thecal sac with a Kerrison  punch and performed foraminotomies about the bilateral L5 nerve roots.  Again, we did not encounter any purulent material but we did take some  cultures from the epidural space.  At this point, we had good  decompression of the thecal sac in the L5 nerve roots and the hardwares  appeared to be well placed and secured.  We then obtained hemostasis  with bipolar cautery.  We irrigated the wound out copiously with  bacitracin solution, we removed the retractor and then reapproximated  the patient's thoracolumbar fascia with interrupted #1 Vicryl suture,  subcutaneous tissue with interrupted 2-0 Vicryl suture and the skin with  a running 3-0 nylon suture.  The wound was then coated with bacitracin  ointment.  Sterile dressing applied.  The drapes were removed.  The  patient was subsequently returned to supine position where she was  extubated by the anesthesia team and transported to postanesthesia care  unit in stable condition.  All sponge, instrument and needle counts were   correct in this case.      Ophelia Charter, M.D.  Electronically Signed     JDJ/MEDQ  D:  05/20/2007  T:  05/20/2007  Job:  [id number]

## 2010-05-19 NOTE — Discharge Summary (Signed)
[name], [name] NO.:  [id number]   MEDICAL RECORD NO.:  [id number]          PATIENT TYPE:  INP   LOCATION:  3031                         FACILITY:  [facility]   PHYSICIAN:  Ophelia Charter, M.D.DATE OF BIRTH:  [birth year]   DATE OF ADMISSION:  05/18/2007  DATE OF DISCHARGE:  05/25/2007                               DISCHARGE SUMMARY   BRIEF HISTORY:  The patient is a 69 year old black female  who I  performed an L4-5 decompression fusion back on February 15, 2007.  The  patient did well initially and then developed some pain.  Recently, she  was worked up with a sed rate which was 51 and a white count was 20.5.  I worked her up with a lumbar MRI which demonstrated some epidural  enhancement concerning for infection.  I admitted the patient to [facility] May 15, 2007 with a preliminary diagnosis of a wound  infection.     For further details of this admission, please refer to the history and  physical.   HOSPITAL COURSE:  I admitted the patient to [facility] on May 18, 2007.  I subsequently performed an exploration of her lumbar wound  with bilateral L4 laminotomies and foraminotomies on May 20, 2007.  At  surgery, I did not see an obvious infection.  We obtained some cultures.  The surgery went well. For further details of this operation, please  refer to the typed operative note.   POSTOP COURSE:  The patient's postop course was unremarkable.  We  started her on antibiotics empirically and the patient felt considerably  better after surgery and on antibiotics.  I had Infectious Disease see  the patient and she was followed by Dr. Tommy Medal. Dr. Tommy Medal adjusted  her antibiotics.  He recommended  she get a cervical lumbar MRI scan to  make there is no other source of infection.  This was done on May 24, 2007, the results of which are presently not available.   By May 25, 2007 the patient was afebrile.  Vital signs was stable and  she  was felt to be stable for discharge to the [facility] skilled  nursing facility for a continued 6 week course of IV antibiotics.   DISCHARGE MEDICATIONS:  1. Azactam 2 g IV  q. 8. hours.  2. Colace 100 mg p.o. b.i.d.  3. Glucophage 500 mg p.o. b.i.d.  4. Lyrica 75 mg p.o. t.i.d.  5. Sliding scale NovoLog insulin.  6. Prinivil 20 mg p.o. daily.  7. Vancomycin 15 mg IV q.12. hours.  8. Ambien 5 mg p.o. nightly p.r.n.  9. P.r.n. Maalox, Percocet, Senokot, Toradol, Tylenol, Valium, Zofran.   FINAL DIAGNOSES:  1. Back pain.  2. Possible wound infection.   PROCEDURE PERFORMED:  1. Exploration lumbar wound.  2. Bilateral L4 laminotomies, foraminotomies.   DISCHARGE INSTRUCTIONS:  The patient instructed to follow up with me in  1 to2 weeks for suture removal.      Ophelia Charter, M.D.  Electronically Signed     JDJ/MEDQ  D:  05/24/2007  T:  05/24/2007  Job:  [id number]   cc:   Alcide Evener, MD

## 2010-05-19 NOTE — Op Note (Signed)
[name], BIA NO.:  [id number]   MEDICAL RECORD NO.:  [id number]          PATIENT TYPE:  INP   LOCATION:  3010                         FACILITY:  [facility]   PHYSICIAN:  Ophelia Charter, M.D.DATE OF BIRTH:  25-Jun-1940   DATE OF PROCEDURE:  DATE OF DISCHARGE:                               OPERATIVE REPORT   BRIEF HISTORY:  The patient is a 70 year old black female who has  suffered from back and leg pain consistent with neurogenic claudication.  She failed medical management and was worked up with a lumbar MRI, which  demonstrated spondylolisthesis and severe stenosis at L4-5.  I discussed  the various treatment options with the patient including surgery.  She  has weighed the risks, benefits and alternatives of surgery.  I will  proceed with a L4-5 decompression and fusion.   PREOPERATIVE DIAGNOSIS:  L4-5 grade 1 acquired spondylolisthesis,  degenerative disk disease, stenosis, lumbar radiculopathy/myelopathy,  and lumbago.   POSTOPERATIVE DIAGNOSIS:  L4-5 grade 1 acquired spondylolisthesis,  degenerative disk disease, stenosis, lumbar radiculopathy/myelopathy,  and lumbago.   PROCEDURES:  1. Bilateral L4 laminotomies.  2. Decompression of the bilateral L4 and L5 nerve roots; L4-5      transforaminal lumbar interbody fusion with local autograft bone      and Actifuse  bone graft extender; insertion of L4-5 interbody      prosthesis (Capstone PEEK interbody prosthesis), L4-5 posterior non-      segmental instrumentation with legacy titanium plates, screws, and      rods; L4-5 posterolateral arthrodesis with local morselized      autograft bone and Actifuse bone graft extender.   SURGEON:  Ophelia Charter, M.D.   ASSISTANT:  Hosie Spangle, M.D.   ANESTHESIA:  General endotracheal.   ESTIMATED BLOOD LOSS:  250 cc.   SPECIMENS:  None.   DRAINS:  None.   COMPLICATIONS:  None.   DESCRIPTION OF PROCEDURE:  The patient is brought to the  operating room  by anesthesia team.  General endotracheal anesthesia was induced.  The  patient was then turned to the prone position on Wilson frame.  Her  lumbosacral region was then prepared with Betadine scrub and Betadine  solution.  Sterile drapes were applied.  I then injected the area to be  incised with Marcaine with epinephrine solution and used scalpel to make  a linear midline incision over the L4-5 interspace.  I used  electrocautery to perform a bilateral subperiosteal dissection exposing  the bilateral spinous process lamina of L3, L4, and L5.  We obtained  intraoperative radiographs to confirm our location.  We then inserted  the first retractor for exposure.   We began the decompression by using high-speed drill to perform  bilateral L4 laminotomies.  I widened the laminotomies with Kerrison  punch and removed the ligamentum flavum at L4-5 and then performed  foraminotomies about the bilateral L4-L5 nerve roots completing the  decompression at this level.  Of note, because of the spondylolisthesis  and severe stenosis, an extensive decompression was required at this  level above and beyond what was required  for the interbody fusion.   I completed the decompression.  We now turned our attention to  arthrodesis.  I removed the inferior facet at L4 on the right to gain  lateral exposure at the L4-5 intervertebral disk.  I incised into the  intravertebral disk with a 15 blade scalpel and then we performed a  partial intervertebral diskectomy with the pituitary forceps.  We then  prepared the vertebral endplates for fusion using curettes removing soft  tissue.  We used trial spacers and determined to use a 10 x 26 mm  Capstone PEEK interbody prosthesis.  We prefilled this prosthesis with a  combination of local autograft bone and Actifuse bone graft extender and  also filled ventrally in disk space with Actifuse and local autograft  bone.  We then inserted the prosthesis  into the L4-5 interspace from the  right, of course after retracting the neural structures out of  harms  way.  We turned it partially laterally using the bone tamps.  There was  a good snug fit of the prosthesis interspace.  We then filled it  posteriorly in the disk space with local autograft bone and Actifuse  completing the transforaminal lumbar interbody fusion.   We now turned our attention to the instrumentation.  Under fluoroscopic  guidance, we cannulated the bilateral L4 and L5 pedicles with the bone  probe.  We tapped the pedicles with 5.5-mm tap and then probed inside  the tapped pedicles to rule out  cortical breeches and then inserted 6.5  x 55 mm pedicle screw bilaterally at L4 and 6.5 x 50 bilaterally at L5  under fluoroscopic guidance.  We then palpated along the medial aspect  of the pedicles and noted the nerves were not injured.  We then  connected unilateral pedicle screws with a lordotic rod which were  tightened in place using the caps which we tightened appropriately  completing the instrumentation.   We now turned out attention to the posterolateral arthrodesis.  We used  a high-speed drill to decorticate the remainder of the left L4-5 facet  pars and transverse processes and then laid a combination of local  autograft bone and Actifuse bone graft extender over these decorticated  posterolateral structures completing the posterolateral arthrodesis.   We then obtained hemostasis using bipolar electrocautery and irrigated  the wound out with bacitracin solution.  We then inspected the thecal  sac and the bilateral L4 and L5 nerve roots and noted they are well  decompressed.  We then removed the retractor and reapproximated the  patient's thoracolumbar fascia with interrupted #1 Vicryl suture,  subcutaneous tissue with interrupted 2-0 Vicryl suture, and the skin  with Steri-Strips and benzoin.  The wound was then coated with  bacitracin ointment.  Sterile dressing  was applied.  The drapes were  removed.  The patient was subsequently returned to the supine position  where she was extubated by anesthesia team and transported to the post-  anesthesia care unit in stable condition.  All sponge, instrument and  needle counts were correct at the end of the case.      Ophelia Charter, M.D.  Electronically Signed     JDJ/MEDQ  D:  02/15/2007  T:  02/16/2007  Job:  [id number]

## 2010-05-19 NOTE — H&P (Signed)
[name], Amber NO.:  [id number]   MEDICAL RECORD NO.:  [id number]          PATIENT TYPE:  INP   LOCATION:  3031                         FACILITY:  [facility]   PHYSICIAN:  Ophelia Charter, M.D.DATE OF BIRTH:  1940-08-05   DATE OF ADMISSION:  05/18/2007  DATE OF DISCHARGE:                              HISTORY & PHYSICAL   BRIEF HISTORY:  The patient is a 70 year old black female who I  performed L4-L5 and L5-S1 decompression due to patient's history of  effusion on February 15, 2007.  The surgery went well.  Her  postoperative course was unremarkable.  Initially, she did have a little  bit of wound drainage which we handled in the office with dressing  changes and Keflex.  She developed a rash with the Keflex, so we changed  her over to Cipro.  Her wound drainage was resolved.  The patient  actually did quite well to a couple of weeks ago when she began to  having increasing pain.  The pain did not improve.  I worked up with a  sed rate which was 51 and lumbar MRI which demonstrated epidural  enhancement possibly indicative of infection.  I called the patient and  had her come to the hospital and admitted her today.   Past medical history, past surgical  history, medications, family  history, and social history, etc. please refer the patient's office  note/history and physical which I provided for chart.   PHYSICAL EXAMINATION:  GENERAL:  A pleasant 70 year old black female who  complains of back pain.  She denies leg pain.  NEUROLOGIC:  The patient is alert and oriented x3.  Her motor strength  is 5/5 in bilateral quadriceps, gastrocnemius, extensor hallucis longus.  Sensation is grossly normal in her lower extremities.  Her lumbar wound  is well-healed.   ASSESSMENT AND PLAN:  Possible wound infection.  I have discussed  treatment with the patient.  We discussed various treatment options.  I  am going to get some blood cultures today.  I recommend she  undergo an  exploration of the wound and possible drainage of infection.  I  described the surgery to her and discussed the risks of the surgery.  I  have answered her all questions.  She would like to proceed without  surgery and we will plan to do that tomorrow.  In mean time, she is  going to be admit for IV morphine for pain control.      Ophelia Charter, M.D.  Electronically Signed     JDJ/MEDQ  D:  05/18/2007  T:  05/19/2007  Job:  [id number]

## 2010-05-19 NOTE — Consult Note (Signed)
[name], [name]               ACCOUNT NO.:  [id number]   MEDICAL RECORD NO.:  [id number]          PATIENT TYPE:  INP   LOCATION:  3013                         FACILITY:  [facility]   PHYSICIAN:  Marcial Pacas, MD          DATE OF BIRTH:  [birth year]   DATE OF CONSULTATION:  DATE OF DISCHARGE:                                 CONSULTATION   CHIEF COMPLAINT:  Possible stroke.   HISTORY OF PRESENT ILLNESS:  The patient is a 70 year old right-handed  [place] American female, with 2 daughters at bedside.  She is presenting  with acute onset of gait difficulty early Tuesday morning.   She went to bed fine on Monday night, woke up early morning trying to go  to the bathroom, and noticed gait difficulty, right leg weak, also  numbness.  There was also mild intermittent right hand paresthesia and  weakness, but she was able to ambulate, using the bathroom, and symptoms  persistent since its onset, she later called her friend, who eventually  brought her to the primary care this morning, later sent by ambulance to  the emergency.   She denies vertigo, denies left side involvement, denies right face  involvement, denies dysarthria, and denies right trunk area sensory  change.  Overall, symptoms have slightly improved since she came to the  emergency this afternoon.   She has multiple vascular risk factors including obesity, diabetes,  hypertension, hyperlipidemia, coronary artery disease, hypothyroid  disease.   REVIEW OF SYSTEMS:  She also complains of right groin area pain, which  is new since she presented to the emergency room this afternoon.   PAST MEDICAL HISTORY:  1. Hypertension.  2. Diabetes.  3. Hyperlipidemia.  4. Obesity.  5. Coronary artery disease.  6. Hypothyroidism.   SURGICAL HISTORY:  Status post the right knee replacement in 1993, low  back surgery, hardware infection, which required long-term antibiotic  treatment, thyroidectomy, hysterectomy, shoulder surgery,  bilateral  hernia repair.   FAMILY HISTORY:  Mother had leukemia.  Father died of heart attack.   SOCIAL HISTORY:  She is a nonsmoker.  Denies drinking or illicit drug  use.  Lives alone.   HOME MEDICATIONS:  Aspirin 81 mg daily, calcium, Crestor, doxycycline,  Lantus, levothyroxine, lisinopril, Lyrica, metformin, pantoprazole,  rifampin.   ALLERGIES:  AVANDIA, KEFLEX.   PHYSICAL EXAMINATION:  VITAL SIGNS:  Temperature 97.7, blood pressure  181/76, heart rate of 78, respiration of 22.  CARDIAC:  Regular rate and rhythm.  PULMONARY:  Clear to auscultation bilaterally.  NECK:  Supple.  No carotid bruits.  NEUROLOGIC:  She is awake, alert, oriented to history taking and care of  conversation.  No dysarthria.  No aphasia.  Cranial nerves II through  XII examination was normal.  In specific, pupil equal, round, reactive  to light.  Extraocular movements were full.  Facial sensation and  strength was normal.  Uvula, tongue midline.  Head turning, shoulder  shrugging normal and symmetric.  Tongue protrusion into cheek strength  was normal.   Motor examination, she has mild right upper extremity drifting,  but no  pronation.  By observation, she was able to use utensil with right hand  without difficulty and right leg.  Also complains of right hip pain,  limited motor examination because of the pain.  She has mild right hip  flexion, knee flexion, weakness, but 4+/5.  No distal leg weakness.   Sensory, lens dependent, decreased light touch, vibratory sensation,  pinprick at distal leg.  Deep tendon reflex brisk, bilateral upper  extremity absence, right Achilles, left patellar too, absent bilateral  ankle reflex.  Plantar responses were flexor.  Coordination, normal  finger-to-nose, heel-to-shin.  Gait, she needs 2 peoples assistant to  get up from seated position, and has difficulty initiate, gait wide-  based unsteady.   CT of the brain without contrast reviewed.  There is diffuse   periventricular white matter disease.  In addition, lacunar infarction  involving left insula, posterior limb of left internal capsule, but no  acute lesions.   LABORATORY EVALUATION:  Normal CMP, INR, cardiac enzymes, and CBC mildly  elevated WBC of 12.  UA was negative.   ASSESSMENT/PLAN:  A 70 year old female with multiple vascular risk  factor presenting with acute onset of right arm and leg weakness, and  also subjective right hemiparesis, certainly suspicious for left  subcortical stroke.  1. MRI of the brain, MRA of the brain, ultrasound of caudate artery,      echocardiogram.  2. She is already on daily aspirin, switch over to Plavix 75 mg every      day.  3. Check liver function, fasting lipid profile, thyroid functional      test, A1c, B12, RPR.  4. Keep blood pressure in 160/90.  5. She would benefit PT/OT.  If she complains of persistent right hip      area pain, may benefit further evaluation.      Marcial Pacas, MD  Electronically Signed     YY/MEDQ  D:  07/24/2008  T:  07/25/2008  Job:  [id number]

## 2010-05-19 NOTE — Discharge Summary (Signed)
[name], [name]          PATIENT TYPE:  INP   LOCATION:  3013                         FACILITY:  [facility]   PHYSICIAN:  Rise Patience, MDDATE OF BIRTH:  27-Sep-1940   DATE OF ADMISSION:  07/24/2008  DATE OF DISCHARGE:                               DISCHARGE SUMMARY   COURSE IN THE HOSPITAL:  A 70 year old female with known history of  diabetes mellitus type 2, hypertension, hyperlipidemia, history of  thyroid cancer treated more than 20 years ago with left-sided  thyroidectomy, presented with complaints of right upper and lower  extremity weakness for more than 24 hours.  On admission, the patient  had a CT head done which showed basal ganglia infarct, which was remote.  Patient was admitted to a medical floor.  Cardiac enzymes were followed,  which were in acceptable limits.  Neurology was consulted.  MRA was  ordered but due to claustrophobia, CT angio was done of the head.  EKGs  were showing only sinus rhythm.  CT angio of the neck and head was done,  which showed no significant tri-vascular stenosis, aneurysm, or  hypoattenuation involving the posterior limb of the left internal  capsule, not significantly changed from the prior exam.  CT neck was  showing mass in the thyroid and cervical lymphadenopathy, which I have  discussed with the patient and the patient's son that she will need  immediate followup with Dr. Ernie Hew to further work up this.  A thyroid  ultrasound was done, results are still pending.   A consult obtained.  Patient did have good swallow.  At this time, Dr.  Leonie Man has recommended patient to be on aspirin for 2 weeks, on 81 mg  Aggrenox 1 capsule in the evening for 2 weeks, then p.o. b.i.d.  thereafter.  Patient  condition and need for followup for the cervical  lymphadenopathy.   PROCEDURES DONE DURING THIS STAY:  1. Chest x-ray on July 24, 2008 showed cardiomegaly, no active   disease.  2. CT head without contrast on July 24, 2008 showed patchy bilateral      microvascular changes in the white matter, remote left lateral      infarction in the basilar ganglia.  No other acute intracranial      findings.  3. CT angio of the head and neck on July 25, 2008 showed no      significant cranial vascular stenosis.  Aneurysmal branch with      occlusion and sclerotic calcification of the cavernous carotid      arteries bilaterally without focal stenosis.  Scattered bilateral      patchy white matter chronic microvascular ischemia.  A more focal      area of hyperattenuation involving the posterior limb of the left      internal capsule.  This has not significantly changed from the      prior exam.  Patient with the right-sided symptoms, subacute      infarction is not excluded.  4. CT of the neck shows no significant stenosis of the neck.  There      was a 2.6 x 1.5 cm soft tissue mass with a central calcification at      the level of the fore station of his right neck.  This may      represent an enlarged lymph node raises suspicion for granulomatous      disease that could be related to  lymphoma.  There is a similar      lesion below the expected location of the thyroid on the left, just      central to the left common carotid artery, measuring 2.1 x 1.3 cm.      She also has calcifications, could be connected to the thyroid      gland.  There is concerning for additional lymph node mass or      potentially a thyroid mass, a 6 mm lesion within the superior      aspect of the right thyroid.  The patient is status post left      thyroidectomy, moderate cervical spondylosis.   PERTINENT LABS:  Patient's cardiac enzymes were negative.  Patient had  LDL at 85.   FINAL DIAGNOSES:  1. Left subcortical infarction, small vessel disease.  2. Diabetes mellitus type 2.  3. Dyslipidemia.  4. Hypertension.  5. Cervical lymphadenopathy and thyroid lesion.   DISCHARGE  MEDICATIONS:  1. Rifampin 300 mg p.o. b.i.d.  2. Metformin 1000 mg p.o. b.i.d.  3. Docusate 100 mg p.o. b.i.d.  4. Lisinopril 40 mg p.o. daily.  5. Calcium with vitamin D 1 tablet p.o. daily.  6. Crestor 10 mg p.o. daily.  7. Lyrica 75 mg p.o. b.i.d.  8. Furosemide 40 mg p.o. daily p.r.n. if there is edema.  9. Aspirin 81 mg p.o. daily in the a.m. x2 weeks, then stop.  10.Aggrenox 1 capsule p.o. daily in the p.m. x2 weeks, then make it 1      capsule 1 p.o. b.i.d. thereafter.  11.Tylenol 650 mg p.o. 1 hour before Aggrenox x1 week, then stop.   PLAN:  Patient to follow up with Dr. Ernie Hew within a week's time.  Recheck BMET, LFTs, CBC.  Thyroid scan, which was done, sonogram of the  thyroid.  Results are still pending.  To be followed with Dr. Ernie Hew.  Patient and patient's son advised that patient will need followup on her  thyroid lesion and cervical lymphadenopathy with Dr. Ernie Hew and follow up  for that for which they have agreed.  Patient's Rifampin and doxycycline  doses have been decided by infectious disease, Dr. Tommy Medal, to follow up  with them and further course is to be decided accordingly.  Patient is  to be on a cardiac-healthy, carb-modified diet.  Activity as tolerated.      Rise Patience, MD  Electronically Signed     Rise Patience, MD  Electronically Signed    ANK/MEDQ  D:  07/26/2008  T:  07/26/2008  Job:  [id number]   cc:   [name], M.D.

## 2010-05-19 NOTE — Discharge Summary (Signed)
[name], [name]               ACCOUNT NO.:  [id number]   MEDICAL RECORD NO.:  [id number]          PATIENT TYPE:  INP   LOCATION:  3010                         FACILITY:  [facility]   PHYSICIAN:  Hosie Spangle, M.D.DATE OF BIRTH:  07-Dec-1940   DATE OF ADMISSION:  02/15/2007  DATE OF DISCHARGE:  02/23/2007                               DISCHARGE SUMMARY   ADMISSION HISTORY AND PHYSICAL EXAMINATION:  The patient is a 70-year-  old woman under the care of Dr. [name].  He evaluated her for  degenerative changes of the lumbar spine and found L4-L5 grade 1  degenerative spondylolisthesis, degenerative disc disease, spinal  stenosis, and symptomatically she had neurogenic claudication and lumbar  radiculopathy as well as low back pain. She has had symptoms off and on  for many years.  There is pain in the back radiating to the lower  extremities.   PAST MEDICAL HISTORY:  Past history is notable for diabetes,  hypertension, hypercholesterolemia, hypothyroidism and osteoarthritis of  the left knee.  She has had a previous right knee replacement for  osteoarthritis.   MEDICATIONS:  At the time of admission include:  1. Lyrica 75 mg t.i.d.  2. Crestor 5 mg daily.  3. Metformin 1,000 mg b.i.d.  4. Levothyroxine 150 mcg daily.  5. Lasix 20 mg daily.  6. Lantus 25 units nightly.  7. Darvocet p.r.n. for pain.   ALLERGIES:  She has no allergies to medications.   SOCIAL HISTORY:  She is widowed and lives alone.   PHYSICAL EXAMINATION:  VITAL SIGNS:  The patient is an obese woman, 5  feet, 7, 260 pounds.  LUNGS:  Clear.  HEART:  Has a regular rate and rhythm.  ABDOMEN:  Benign.  NEUROLOGICAL:  Exam showed 5/5 strength, intact sensation.   HOSPITAL COURSE:  The patient was admitted by Dr. [name] who performed  L4-L5 lumbar decompression, interbody fusion and posterior lateral  arthrodesis with posterior instrumentation and bone graft.  Following  surgery she has made gradual  progress.  Dr. [name] obtained physical  therapy and occupational therapy consultations.  He felt the patient  would be a good candidate for inpatient rehabilitation and she was seen  in consultation by Dr. Alger Simons from physical medicine and  rehabilitation.  Unfortunately, they recommended placement in a nursing  home.  The patient continued to work with physical therapy and  occupational therapy and made significant progress and has been able to  ambulate in the halls with a rolling walker with assistance.  We had the  patient re-evaluated by the rehabilitation service and, again, it was  felt that she did not qualify for inpatient rehabilitation and therefore  the case management service was consulted for skilled nursing facility  placement.  Arrangements have been made for the patient to be  transferred to the [facility] for further care including  physical therapy.  At this time the patient's wound is healing nicely.  She is afebrile.  She is ambulating with a rolling walker with 5 inch  wheels with assistance.  She is to return to see Dr.  Jenkins in about 2-  1/2 weeks and she is to call for that appointment.   DISCHARGE MEDICATIONS:  Her medications at the time of discharge  included her preoperative medications as well as Percocet and/or Tylenol  p.r.n. for pain.   Dr. [name] was suspicious during her hospitalization that she might  have a pneumonia, although the chest x-ray seemed to show evidence of  atelectasis. He treated her with Avelox.  At this time she is afebrile  and her white blood cell count is much improved and her chest x-ray just  shows mild atelectasis without evidence of infiltrates.   DISCHARGE DIAGNOSES:  Lumbar spondylolisthesis, lumbar stenosis, lumbar  spondylosis and neurogenic claudication.      Hosie Spangle, M.D.  Electronically Signed     RWN/MEDQ  D:  02/22/2007  T:  02/22/2007  Job:  [id number]   cc:   With pat. to  [facility]

## 2010-05-22 NOTE — Op Note (Signed)
[facility]  Patient:    [name], STEEGE Visit Number: [id number] MRN: [id number]          Service Type: SUR Location: 4W [id number] Attending Physician:  [name] Dictated by:   R. Hart Robinsons, M.D. Proc. Date: 07/11/01 Admit Date:  07/11/2001                             Operative Report  PREOPERATIVE DIAGNOSIS:  Right knee end-stage osteoarthritis.  POSTOPERATIVE DIAGNOSIS:  Right knee end-stage osteoarthritis.  PROCEDURE:  Right total knee arthroplasty.  SURGEON:  R. Hart Robinsons, M.D.  ASSISTANT:  Duncan Dull. Troncale, P.A.C.  ANESTHESIA:  Attempted spinal unsuccessful followed by general.  ESTIMATED BLOOD LOSS:  Less than 50 cc.  DRAINS:  Two medium Hemovacs.  COMPLICATIONS:  None.  TOURNIQUET TIME:  Two hours at 400 mmHg.  DISPOSITION:  To PACU stable.  OPERATIVE IMPLANTS:  Osteonics components all cemented, posterior stabilized, size 9 femur, size 9 tibia, 10 mm polyethylene insert with a 26 mm polyethylene patella.  DESCRIPTION OF PROCEDURE:  The patient was counselled in the holding area, correct side was identified, IV started, antibiotics given. She was then taken to the OR, turned in the lateral position where a spinal was attempted. This was unsuccessful, the spinal epidural, therefore, she was turned supine and placed under general anesthesia. A Foley catheter placed utilizing sterile technique by the OR circulating nurse and began preoperative antibiotics. Examination revealed a 5 degree flexion contracture flexed to 130 degrees. She was elevated, prepped with Duraprep and all were draped in a sterile fashion. Exsanguinated with an Esmarch, the tourniquet was inflated to 400 mmHg. A straight midline incision was made through the skin and subcutaneous tissues, medial and lateral soft tissue flaps developed, small bleeders electrocoagulated. Medial parapatellar arthrotomy was performed. Medial soft tissue  released in the proximal and medial tibia appropriate level, the patella was everted, the knee was then flexed. She had end-stage osteoarthritic changes and large bone spurs. The cruciate ligaments were resected. A starting hole made through the distal femur, canal was irrigated, intermedullary rod was gently placed and chose a 5 degree valgus cut with a 10 mm cut off the distal femur. Following this, the distal femur was sized and was found to be a size #9. Rotational marks were made and the distal femur was cut to fit a size #9. The medial and lateral menisci were removed, geniculate vessels were coagulated. The tibial eminence was resected. Appropriate lateral soft tissue releases were begun at this point in time. The proximal tibia was found to be a size #9, it was reamed, a step reamer was utilized, the intermedullary rod was gently placed, the canal was irrigated until the effluent was clear. At this time, we chose a zero degree slope, took a 10 mm cut based upon the medial side and took initially a 10 mm cut off the proximal tibia. Posteromedial and posterolateral femoral osteophytes were removed under direct visualization. At this point in time, the femoral trochlea was prepared and this was prepared in standard fashion. A trial was then utilized. At this point in time, she was a bit tight in flexion and extension, all trials were removed and I took another 2 mm off the proximal tibia. At this time, a size 9 femur, size 9 tibia and 10 mm insert with excellent range of motion, balance and flexion and extension, and excellent tracking. Rotation marks  were made and the delta keel was performed in the standard fashion. Attention was directed to the patella and it was found to be a size 26, reamed to the appropriate depth. Locking holes were made and excessive bone removed.  At this time utilizing modern cement technique, all components were cemented into place. A size 9 tibia, size 9  femur with a 26 patella. After the cement to cure, we went through eight 10 mm trials with the 10 mm trial with excellent range of motion, balance and flexion extension. In addition, patellofemoral tracking was anatomic and did not require release. The knee was then flexed, tibial trial was removed, all excess cement was removed. Bone wax placed on exposed bony surfaces and she was again irrigated and a final 10 mm component was placed. At this point in time, I placed bone wax on exposed bony surfaces, two medium Hemovac drains were placed. Excellent range of motion and tracking was evaluated.  The knee was irrigated again with pulsatile lavage and antibiotic solution. Sequential closure of layers was done, arthrotomy with Vicryl, subcu Vicryl, and small veins were electrocoagulated and the skin was closed with nice staple closure. A sterile dressing was applied, tourniquet was deflated. She had normal pulse in the foot and ankle at the end of the case. She was given another gram of Ancef at the end of case, tourniquet was deflated. The drain was allowed to hook to suction. Knee immobilizer in place, she was then gently awakened, extubated and taken from the operating room to PACU in stable condition. There were no complications. Sponge and needle count were correct.  To decrease surgical time and help throughout this entire procedure, Mr. Quincy Sheehan assistance was needed. Dictated by:   R. Hart Robinsons, M.D. Attending Physician:  [name] DD:  07/11/01 TD:  07/14/01 Job: [id number]

## 2010-05-22 NOTE — H&P (Signed)
[facility]  Patient:    [name], [name] Visit Number: [id number] MRN: [id number]          Service Type: Attending:  R. Hart Robinsons, M.D. Dictated by:   Duncan Dull Troncale, P.A.C. Adm. Date:  07/11/01                           History and Physical  DATE OF BIRTH:  04-19-1940  SSN:  [id number].  CHIEF COMPLAINT:  Right knee pain.  HISTORY OF PRESENT ILLNESS:  [name] is a 70 year old female who has had a long-standing history of right knee pain and problems.  She had undergone a right knee arthroscopy back in July 2002 and noted to have significant arthritic changes.  Since then she has had cortisone injections as well as a series of Synvisc injections without relief or improvement.  Because of the lack of improvement, conservative measures, and our finding on clinical and radiograph exams, she has now elected to undergo surgical intervention on her knee.  REVIEW OF SYSTEMS:  She denies recent fever or chills.  No diplopia, blurred vision, or headaches.  She does report an occasional left earache.  No rhinorrhea or sore throat.  No chest pain, shortness of breath, or cough.  No abdominal pain, nausea, vomiting, diarrhea, or constipation.  No melena or bright red blood per rectum.  No urinary frequency, hematuria, or dysuria.  No numbness or tingling in her extremities.  FAMILY HISTORY:  Significant for heart disease and MIs in her mother and father.  PAST MEDICAL HISTORY: 1. Non-insulin dependent diabetes. 2. Thyroid disease.  PAST SURGICAL HISTORY: 1. Tonsillectomy. 2. Right knee arthroscopy. 3. Hysterectomy. 4. Tubal ligation. 5. Thyroidectomy. 6. Toe surgery. 7. Abdominal hernia repair. 8. Cholecystectomy. 9. Left arm and left shoulder surgery.  ALLERGIES:  No known drug allergies.  CURRENT MEDICATIONS: 1. Glucophage XR 500 mg b.i.d. 2. Synthroid 0.175 mg q.d. 3. Insulin.  SOCIAL HISTORY:  She is married and  has five children.  She currently takes care of two children during the day.  She denies tobacco or alcohol use. Dr. Marilynne Drivers is her medical physician.  PHYSICAL EXAMINATION:  VITAL SIGNS:  Pulse is 80, respiratory 16, blood pressure 160/90.  GENERAL:  This is a well-developed, well-nourished female in no acute distress.  HEENT:  Head is atraumatic, normocephalic.  Pupils equal, round, and reactive to light.  Extraocular movements are grossly intact.  Oropharynx is clear without redness, exudate, or lesion.  TMs clear without redness or effusion.  NECK:  Supple.  No cervical lymphadenopathy.  CHEST:  Clear to auscultation bilaterally with no wheezes or crackles.  HEART:  Regular rate and rhythm with no murmurs, rubs, or gallops.  ABDOMEN:  Soft, nontender, nondistended with active bowel sounds, mildly obese abdomen.  BREASTS/GENITOURINARY:  Not examined, not pertinent to present illness.  EXTREMITIES:  There is no instability to the knee.  Range of motion is about 5-120 degrees with crepitation.  NEUROLOGIC:  Motor and sensory are grossly intact in the lower extremities. No rash or lesions.  RADIOGRAPHIC STUDIES:  X-rays of the right knee demonstrate end-stage osteoarthritis with a valgus malalignment.  IMPRESSION: 1. Right knee osteoarthritis. 2. Non-insulin dependent diabetes. 3. Thyroid disease.  PLAN:  The patient will be admitted to [facility] to undergo a right total knee arthroplasty by Dr. Theda Sers on July 11, 2001.  The patient is seeing Dr. Marilynne Drivers on July 1  for preoperative medical evaluation.  We will await clearance note from him.  Preoperative labs and signed consents will be obtained.  All questions have been encouraged and answered. Dictated by:   Duncan Dull Troncale, P.A.C. Attending:  R. Hart Robinsons, M.D. DD:  07/04/01 TD:  07/05/01 Job: [id number]

## 2010-05-22 NOTE — Discharge Summary (Signed)
[facility]  Patient:    [name], [name] Visit Number: [id number] MRN: [id number]          Service Type: SUR Location: 4W [id number] Attending Physician:  [name] Dictated by:   Duncan Dull Troncale, P.A.C. Admit Date:  07/11/2001 Discharge Date: 07/19/2001   CC:         Kathee Delton, M.D.  Lorretta Harp, M.D.  Sharene Butters, M.D.  Stanley C. Redmond Pulling, M.D.  Daniel L. Theda Sers, M.D.   Discharge Summary  PRINCIPAL DIAGNOSES: 1. End stage osteoarthritis of right knee. 2. Hypoxic episodes, probably secondary to obstructive sleep apnea. 3. Hypokalemia. 4. Hyponatremia.  SECONDARY DIAGNOSES: 1. Non-insulin-dependent diabetes. 2. Thyroid disease. 3. Obesity.  SURGICAL PROCEDURE:  Right total knee arthroplasty by Dr. Theda Sers with the assistance of Burt Ek, P.A.-C. on July 11, 2001.  Please see operative summary for further details.  CONSULTATIONS: 1. Dr. Gwenette Greet in pulmonary and critical care. 2. Dr. Gwenlyn Found in medicine.  LABORATORY DATA:  Hemoglobin and hematocrit preoperatively were 13.1 and 38.9, respectively.  Postoperative she reached a low of 9.2 hemoglobin on July 14, 2001.  PT, INR, and PTT preoperatively were 12.2, 0.8, and 29, respectively. INR postoperatively ranged from 1.8 to 2.2.  Routine chemistries preoperatively were all within normal limits with the exception of an elevated glucose of 197, and a low calcium of 8.2.  Sodium and potassium were 138 and 3.7, respectively.  BUN and creatinine were 7 and 0.6, respectively. Postoperatively, her sodium ranged between 132 and 136.  Potassium ranged between 3.1 and 3.3.  Glucose consistently ranged above normal limits of 197 to 262.  BUN and creatinine remained within normal limits.  CK on 07/12/01 was 132, CK-MB 1.6, relative index 1.2, and troponin-I of 0.27.  Second set was CK 140, MB 4.1, relative index 2.9, and troponin-I of 0.37.  Urinalysis preoperatively  was all within normal limits with negative parameters.  Preoperative chest x-ray from 07/06/01, showed markings in the right suprahilar area with one area of density.  Recommended a limited CT scan to evaluate this density further.  On 07/12/01, a CT scan showed that the opacity in question appeared to represent scarring, and there was no adenopathy.  They did recommend though because of a nodular component, recommended a CT for followup in four months of this right suprahilar region.  Portable chest x-ray on 07/12/01, showed new opacity at the medial right lung base, rule out pneumonia. A spiral CT of the chest demonstrated increasing opacities in the right greater than left lower lobes, but no PE.  On 07/13/01, chest x-ray showed increase in the atelectasis and pneumonia in the bases, particularly on the right.  On 07/14/01, she had slight decrease in the perihilar and medial basilar atelectasis air space disease, cardiomegaly which was mild, and interstitial edema not excluded.  EKG preoperatively showed normal sinus rhythm with some minimal voltage criteria for left ventricular hypertrophy, nonspecific T-wave abnormalities.  On 07/12/01, EKG showed sinus tachycardia with left ventricular hypertrophy changes.  CHIEF COMPLAINT:  Right knee pain.  HISTORY OF PRESENT ILLNESS:  The patient is a 70 year old female who has had long-standing right knee pain and problems.  She had previously had knee arthroscopy as well as a course of injections and series of synvis injections without relief.  Because of the lack of improvement with conservative measures, she has now elected to undergo surgical intervention on her knee. She had seen Dr. Marilynne Drivers for preoperative medical evaluation  and clearance, and he felt that there was no alternative to knee surgery, although she is a moderate to high surgical risk.  HOSPITAL COURSE:  Following the surgical procedure, the patient was taken to the PACU in stable  condition, transferred to the orthopedic floor in good condition.  She received routine postoperative antibiotics and pain medications.  Her hemoglobin and hematocrit remained stable postoperatively, and she did not require any blood transfusions.  She was treated for hypokalemia and hyponatremia with improvement.  She did run low-grade temperatures initially postoperatively.  This was treated with incentive spirometer with improvement.  Wound was examined on postoperative day #2, and subsequently found to be clean, dry, and intact without signs or symptoms of infection.  On the night of postoperative day #1, she did develop some apparently decline in her mental status and hypoxic changes.  She was evaluated by the P.A. on call, and transferred to the intensive care unit under the care of Dr. Gwenlyn Found and Dr. Koleen Nimrod.  Pulmonology was also consulted.  The patient did improve fairly rapidly with oxygenation.  Cardiac enzymes were obtained, did not demonstrate cardiac involvement.  PE was ruled out by CT scan.  There was some question about pneumonia, so she was treated empirically with Tequin, although this was felt to probably not be more atelectasis, not really pneumonia.  The patient did improve and continued to do actually extremely well afterwards.  It was felt by the pulmonary critical care team that this hypoxic episode actually represented a postoperative obstructive sleep apnea episode.  She continued to improve on restarting physical, and did extremely well with physical therapy.  A rehabilitation consult was obtained, they felt she was an acceptable candidate for rehab, but apparently due to her progression with physical therapy as well as her  insurance issues, it was felt that she would be a better candidate for home health physical therapy.  The patient continued physical therapy in the hospital, and by postoperative day #8, on 07/19/01, she had stabilized medically and  orthopedically and was ready for discharge home.  DISPOSITION:  She is being discharged home with [facility].  DIET:  Diabetic.  FOLLOWUP: 1. Dr. Theda Sers in one to 1-1/2 weeks.  She is to call [phone] for an    appointment. 2. Dr. Redmond Pulling for a repeat CT scan of her chest in four months to evaluate the    right suprahilar region. 3. She is scheduled for an outpatient sleep study on 08/21/01, Monday, at 5:30    p.m.  ACTIVITY:  Weightbearing as tolerated, total knee protocol.  Use the CPM machine for 8 to 10 hours a day intermittently.  Increasing range of motion. TED hose.  WOUND CARE:  She is to have once daily dressing changes to the knee.  Okay to shower once daily.  DISCHARGE MEDICATIONS:  1. Lantus 20 units q.h.s.  2. Glucophage XR 1000 mg q.d.  3. Levothroid 0.175 mg q.d.  4. Hydrochlorothiazide 25 mg q.d.  5. Altace 2.5 mg q.d.  6. Coumadin per pharmacy dosing.  7. Tequin 400 mg p.o. q.d. x2 days.  8. Mepergan Fortis one or two q.4-6h. p.r.n. pain.  9. Robaxin 500 mg p.o. q.8h. p.r.n. spasm. 10. Albuterol inhaler 2 puffs q.4-6h. p.r.n. wheezing or shortness of breath.  CONDITION ON DISCHARGE:  Good and improved. Dictated by:   Duncan Dull Troncale, P.A.C. Attending Physician:  [name] DD:  07/19/01 TD:  07/23/01 Job: [id number]

## 2010-05-22 NOTE — Op Note (Signed)
NAMEDEONKA, DONOHOE NO.:  [id number]   MEDICAL RECORD NO.:  [id number]          PATIENT TYPE:  AMB   LOCATION:  ENDO                         FACILITY:  [facility]   PHYSICIAN:  Wonda Horner, M.D.   DATE OF BIRTH:  January 03, 1941   DATE OF PROCEDURE:  03/19/2004  DATE OF DISCHARGE:                                 OPERATIVE REPORT   INDICATIONS FOR PROCEDURE:  Family history of colon cancer and history of  colon polyps.   Informed consent was obtained after explanation of the risks of bleeding,  infection and perforation.   PREMEDICATION:  Fentanyl 100 mcg IV, Versed 10 mg IV.   PROCEDURE:  With the patient in the left lateral decubitus position, a  rectal exam was performed. No masses were felt. The Olympus colonoscope was  inserted into the rectum and advanced around the colon to the cecum. Cecal  landmarks were identified. The cecum and ascending colon were normal. The  transverse colon normal. The descending colon, sigmoid and rectum were  normal. The colon was very tortuous. She tolerated the procedure well  without complication.   IMPRESSION:  Normal colonoscopy to the cecum.      SFG/MEDQ  D:  03/19/2004  T:  03/19/2004  Job:  [id number]   cc:   Barton Fanny, M.D.  [address] [place]  [place] [address]  Fax: [phone]

## 2010-05-25 ENCOUNTER — Inpatient Hospital Stay (INDEPENDENT_AMBULATORY_CARE_PROVIDER_SITE_OTHER)
Admission: RE | Admit: 2010-05-25 | Discharge: 2010-05-25 | Disposition: A | Discharge status: Home / Self Care | Payer: Medicare Other | Source: Ambulatory Visit | Attending: Emergency Medicine | Admitting: Emergency Medicine

## 2010-05-25 DIAGNOSIS — M67919 Unspecified disorder of synovium and tendon, unspecified shoulder: Secondary | ICD-10-CM

## 2010-05-28 ENCOUNTER — Encounter: Payer: Self-pay | Admitting: Cardiology

## 2010-05-29 ENCOUNTER — Encounter: Payer: Self-pay | Admitting: Cardiology

## 2010-05-29 ENCOUNTER — Ambulatory Visit (INDEPENDENT_AMBULATORY_CARE_PROVIDER_SITE_OTHER): Payer: Medicare Other | Admitting: Cardiology

## 2010-05-29 VITALS — BP 136/70 | HR 61 | Ht 66.0 in | Wt 261.0 lb

## 2010-05-29 DIAGNOSIS — R0789 Other chest pain: Secondary | ICD-10-CM

## 2010-05-29 DIAGNOSIS — R079 Chest pain, unspecified: Secondary | ICD-10-CM

## 2010-05-29 NOTE — Progress Notes (Signed)
[name] Date of Birth: [birth year]   History of Present Illness: [name] is seen for preoperative clearance for left total knee replacement. She is a pleasant 70 year old black female who seen previously one year ago. She underwent a cardiac catheterization in March of 2011 which was normal. She does have a history of diabetes and hyperlipidemia. About 2 weeks ago she went to the hospital with an episode of severe chest pain. Her cardiac evaluation was unremarkable. This is thought to be related to acid reflux disease. Her symptoms have resolved.  Current Outpatient Prescriptions on File Prior to Visit  Medication Sig Dispense Refill  . Acetaminophen-Codeine 300-30 MG per tablet Take 1 tablet by mouth every 6 (six) hours.        Marland Kitchen alendronate (FOSAMAX) 70 MG tablet Take 70 mg by mouth every 7 (seven) days. Take with a full glass of water on an empty stomach.       Marland Kitchen amLODipine (NORVASC) 2.5 MG tablet Take 2.5 mg by mouth daily.        . Calcium Carbonate Antacid (MAALOX) 600 MG chewable tablet        . CALCIUM PO Take 1 tablet by mouth daily.        Marland Kitchen dipyridamole-aspirin (AGGRENOX) 25-200 MG per 12 hr capsule Take 1 capsule by mouth 2 (two) times daily.       Marland Kitchen doxycycline (DORYX) 100 MG EC tablet Take 100 mg by mouth 2 (two) times daily.       . hydrochlorothiazide 25 MG tablet Take 25 mg by mouth daily.        . insulin aspart (NOVOLOG) 100 UNIT/ML injection        . levothyroxine (SYNTHROID, LEVOTHROID) 150 MCG tablet Take 150 mcg by mouth daily.       Marland Kitchen lisinopril (PRINIVIL,ZESTRIL) 40 MG tablet Take 40 mg by mouth 2 (two) times daily.        . metFORMIN (GLUCOPHAGE) 1000 MG tablet Take 1,000 mg by mouth 2 (two) times daily with a meal.        . pantoprazole (PROTONIX) 20 MG tablet Take 20 mg by mouth 2 (two) times daily.        . rosuvastatin (CRESTOR) 5 MG tablet Take 10 mg by mouth daily.       Marland Kitchen DISCONTD: escitalopram (LEXAPRO) 10 MG tablet Take 10 mg by mouth daily.         . ondansetron (ZOFRAN) 4 MG tablet ( UNKNOWN IF TAKING )      . DISCONTD: diazepam (VALIUM) 5 MG tablet        . DISCONTD: senna (SENOKOT) 8.6 MG tablet        . DISCONTD: zolpidem (AMBIEN) 5 MG tablet Take 5 mg by mouth.          Allergies  Allergen Reactions  . Actos (Pioglitazone Hydrochloride)   . Cephalexin   . Norvasc (Amlodipine Besylate)   . Rosiglitazone Maleate     Past Medical History  Diagnosis Date  . Diabetes mellitus   . Hyperlipidemia   . Hypertension   . History of transient ischemic attack (TIA)   . Thyroid carcinoma   . GERD (gastroesophageal reflux disease)   . OA (osteoarthritis)   . Asthma   . Anemia   . Chest pain, atypical     Past Surgical History  Procedure Date  . Cardiac catheterization 03/18/2009    NORMAL LEFT VENTRICULAR SIZE AND CONTRACTILITY WITH NORMAL SYSTOLIC  FUNCTION.  EF 60%  . Cardiolite study     SHOWED A SIGNIFICANT REVERSIBLE ANTERIOR WALL DEFECT CONSISTENT WITH ISCHEMIA. EF 55%  . Thyroidectomy   . Back surgery   . Lumbar fusion   . Total knee arthroplasty     right    History  Smoking status  . Former Smoker  Smokeless tobacco  . Not on file    History  Alcohol Use: Not on file    Family History  Problem Relation Age of Onset  . Pneumonia Mother   . Heart attack Father     Review of Systems:  All other systems were reviewed and are negative.  Physical Exam: BP 136/70  Pulse 61  Ht 5\' 6"  (1.676 m)  Wt 261 lb (118.389 kg)  BMI 42.13 kg/m2 She is an obese black female in no acute distress. Her HEENT exam is unremarkable. She has no JVD or bruits. Lungs are clear. Cardiac exam reveals a regular rate and rhythm without gallop or murmur. Abdomen is obese, soft, nontender. She has no edema. Pedal pulses are good. LABORATORY DATA: ECG demonstrates normal sinus rhythm with a normal ECG.  Assessment / Plan:

## 2010-05-29 NOTE — Patient Instructions (Signed)
Continue your current medications.  We will clear you for your knee surgery.  I will see you back as needed.

## 2010-05-29 NOTE — Assessment & Plan Note (Signed)
She had a normal cardiac catheterization one year ago. Her recent chest pain symptoms are atypical and most most consistent with esophageal pain. She has a normal ECG. She will be cleared for her left total knee replacement. We will see her back as needed.

## 2010-06-29 ENCOUNTER — Encounter: Payer: Self-pay | Admitting: Infectious Disease

## 2010-06-29 ENCOUNTER — Ambulatory Visit (INDEPENDENT_AMBULATORY_CARE_PROVIDER_SITE_OTHER): Payer: Medicare Other | Admitting: Infectious Disease

## 2010-06-29 VITALS — BP 162/70 | HR 90 | Temp 98.2°F | Wt 248.0 lb

## 2010-06-29 DIAGNOSIS — M1710 Unilateral primary osteoarthritis, unspecified knee: Secondary | ICD-10-CM

## 2010-06-29 DIAGNOSIS — R0789 Other chest pain: Secondary | ICD-10-CM

## 2010-06-29 DIAGNOSIS — M869 Osteomyelitis, unspecified: Secondary | ICD-10-CM

## 2010-06-29 DIAGNOSIS — IMO0002 Reserved for concepts with insufficient information to code with codable children: Secondary | ICD-10-CM

## 2010-06-29 LAB — C-REACTIVE PROTEIN: CRP: 0.6 mg/dL — ABNORMAL HIGH (ref <0.6)

## 2010-06-29 LAB — BASIC METABOLIC PANEL
Chloride: 104 mEq/L (ref 96–112)
Potassium: 4.7 mEq/L (ref 3.5–5.3)
Sodium: 141 mEq/L (ref 135–145)

## 2010-06-29 LAB — SEDIMENTATION RATE: Sed Rate: 20 mm/hr (ref 0–22)

## 2010-06-29 MED ORDER — DOXYCYCLINE HYCLATE 100 MG PO TBEC: 100.0000 mg | DELAYED_RELEASE_TABLET | Freq: Two times a day (BID) | ORAL | Status: DC

## 2010-06-29 NOTE — Assessment & Plan Note (Signed)
I will check ESR again. She has no clinical evidence for active infection. I think she should be fine to undergo TKA on the left. I will however leave her on doxy for now and then consider trial off of antibiotics

## 2010-06-29 NOTE — Assessment & Plan Note (Signed)
She is cleared to go ahead and have TKA on the left. I would have her do 7 days of IN mupirocin and hibiclens prior to her elective surgery

## 2010-06-29 NOTE — Assessment & Plan Note (Signed)
Dr. Martinique thinks this is due to GERD. Doxy could be playing a role and this is another region to consider trial off of doxy down the road

## 2010-06-29 NOTE — Progress Notes (Signed)
  Subjective:    Patient ID: [name], female    DOB: 1940-12-18, 70 y.o.   MRN: [id number]  HPI  70 year old AA lady is s.p L4-5 decompression and fusion by Dr. [name] in February 2009 for severe spondylolisthesis, spinal stenosis with severe lower extremity pain. Pt then developed L3-4 diskitis, and epidural and paraspinal fluid collections in May 2009 when she underwent I and D by Dr. [name]. Her cultures on oral antibiotics failed to yield an organism. She was treated with aztreonama nd vancomycin, and later changed to oral suppressive rifampin and doxycycline. She has now been on doxycyline for several years. Her ESR, CRP have been normal. She has been without fevers, chills or systemic symptoms. She was seen by Dr. Martinique from [facility] after she had been admitted from clinic for chest pain (2nd time she has come int complaining of chest pain). She had prior clean cardiac cath and recent symptoms thought to be secondary to GERD> She is having back pain only with prolonged weight bearing and standing but not at rest. She is about to undergo left TKA by Dr. Theda Sers. I think she should be fine to undergo surgery at this poin though I will keep her on doxycline in the interim.   Review of Systems    see HPI otherwise negative on 12 point review Objective:   Physical Exam    alert, oriented x 4, obese, NAD, PERRL, EOM, cv: RRR no mgr, LUngs, Clear, GI Soft nondistended. MSK: she has no tenderness along her surgical sites, no warmth or fluctuance. NEuro; nonfocal, normal balance sensation, gait> PSych: normal affect and thoght content   Assessment & Plan:

## 2010-06-30 LAB — CBC WITH DIFFERENTIAL/PLATELET
Basophils Absolute: 0 10*3/uL (ref 0.0–0.1)
Basophils Relative: 0 % (ref 0–1)
Hemoglobin: 10 g/dL — ABNORMAL LOW (ref 12.0–15.0)
Lymphocytes Relative: 11 % — ABNORMAL LOW (ref 12–46)
MCHC: 29.3 g/dL — ABNORMAL LOW (ref 30.0–36.0)
Monocytes Relative: 6 % (ref 3–12)
Neutro Abs: 11.2 10*3/uL — ABNORMAL HIGH (ref 1.7–7.7)
Neutrophils Relative %: 82 % — ABNORMAL HIGH (ref 43–77)
RDW: 13.5 % (ref 11.5–15.5)
WBC: 13.7 10*3/uL — ABNORMAL HIGH (ref 4.0–10.5)

## 2010-07-06 ENCOUNTER — Encounter: Payer: Self-pay | Admitting: Cardiology

## 2010-07-13 ENCOUNTER — Encounter: Payer: Self-pay | Admitting: Cardiology

## 2010-08-03 ENCOUNTER — Other Ambulatory Visit: Payer: Self-pay | Admitting: Specialist

## 2010-08-03 ENCOUNTER — Encounter (HOSPITAL_COMMUNITY): Payer: Medicare Other

## 2010-08-03 LAB — URINALYSIS, ROUTINE W REFLEX MICROSCOPIC
Bilirubin Urine: NEGATIVE
Nitrite: NEGATIVE
Protein, ur: NEGATIVE mg/dL
Specific Gravity, Urine: 1.017 (ref 1.005–1.030)
Urobilinogen, UA: 0.2 mg/dL (ref 0.0–1.0)

## 2010-08-03 LAB — COMPREHENSIVE METABOLIC PANEL
Albumin: 3.4 g/dL — ABNORMAL LOW (ref 3.5–5.2)
BUN: 24 mg/dL — ABNORMAL HIGH (ref 6–23)
Chloride: 102 mEq/L (ref 96–112)
Creatinine, Ser: 0.73 mg/dL (ref 0.50–1.10)
GFR calc Af Amer: >60 mL/min (ref >60)
Glucose, Bld: 102 mg/dL — ABNORMAL HIGH (ref 70–99)
Total Bilirubin: 0.5 mg/dL (ref 0.3–1.2)

## 2010-08-03 LAB — DIFFERENTIAL
Basophils Absolute: 0 10*3/uL (ref 0.0–0.1)
Basophils Relative: 0 % (ref 0–1)
Eosinophils Absolute: 0.1 10*3/uL (ref 0.0–0.7)
Eosinophils Relative: 1 % (ref 0–5)

## 2010-08-03 LAB — CBC
Platelets: 331 10*3/uL (ref 150–400)
RDW: 14 % (ref 11.5–15.5)
WBC: 13.4 10*3/uL — ABNORMAL HIGH (ref 4.0–10.5)

## 2010-08-03 LAB — SURGICAL PCR SCREEN: Staphylococcus aureus: NEGATIVE

## 2010-08-03 LAB — PROTIME-INR
INR: 0.94 (ref 0.00–1.49)
Prothrombin Time: 12.8 seconds (ref 11.6–15.2)

## 2010-08-11 ENCOUNTER — Inpatient Hospital Stay (HOSPITAL_COMMUNITY)
Admission: RE | Admit: 2010-08-11 | Discharge: 2010-08-17 | DRG: 470 | Disposition: A | Discharge status: Skilled Nursing Facility | Payer: Medicare Other | Source: Ambulatory Visit | Attending: Specialist | Admitting: Specialist

## 2010-08-11 DIAGNOSIS — Z85850 Personal history of malignant neoplasm of thyroid: Secondary | ICD-10-CM

## 2010-08-11 DIAGNOSIS — M1710 Unilateral primary osteoarthritis, unspecified knee: Principal | ICD-10-CM | POA: Diagnosis present

## 2010-08-11 DIAGNOSIS — K219 Gastro-esophageal reflux disease without esophagitis: Secondary | ICD-10-CM | POA: Diagnosis present

## 2010-08-11 DIAGNOSIS — E119 Type 2 diabetes mellitus without complications: Secondary | ICD-10-CM | POA: Diagnosis present

## 2010-08-11 DIAGNOSIS — F411 Generalized anxiety disorder: Secondary | ICD-10-CM | POA: Diagnosis present

## 2010-08-11 DIAGNOSIS — Z794 Long term (current) use of insulin: Secondary | ICD-10-CM

## 2010-08-11 DIAGNOSIS — I10 Essential (primary) hypertension: Secondary | ICD-10-CM | POA: Diagnosis present

## 2010-08-11 DIAGNOSIS — N390 Urinary tract infection, site not specified: Secondary | ICD-10-CM | POA: Diagnosis not present

## 2010-08-11 DIAGNOSIS — D62 Acute posthemorrhagic anemia: Secondary | ICD-10-CM | POA: Diagnosis not present

## 2010-08-11 DIAGNOSIS — Z96659 Presence of unspecified artificial knee joint: Secondary | ICD-10-CM

## 2010-08-11 DIAGNOSIS — E669 Obesity, unspecified: Secondary | ICD-10-CM | POA: Diagnosis present

## 2010-08-11 DIAGNOSIS — E785 Hyperlipidemia, unspecified: Secondary | ICD-10-CM | POA: Diagnosis present

## 2010-08-11 DIAGNOSIS — Z01812 Encounter for preprocedural laboratory examination: Secondary | ICD-10-CM

## 2010-08-11 LAB — GLUCOSE, CAPILLARY
Glucose-Capillary: 95 mg/dL (ref 70–99)
Glucose-Capillary: 143 mg/dL — ABNORMAL HIGH (ref 70–99)

## 2010-08-11 LAB — ABO/RH: ABO/RH(D): AB POS

## 2010-08-12 LAB — BASIC METABOLIC PANEL
CO2: 26 mEq/L (ref 19–32)
Calcium: 7.8 mg/dL — ABNORMAL LOW (ref 8.4–10.5)
Creatinine, Ser: 0.65 mg/dL (ref 0.50–1.10)
GFR calc non Af Amer: >60 mL/min (ref >60)
Glucose, Bld: 140 mg/dL — ABNORMAL HIGH (ref 70–99)

## 2010-08-12 LAB — HEMOGLOBIN AND HEMATOCRIT, BLOOD: Hemoglobin: 8.3 g/dL — ABNORMAL LOW (ref 12.0–15.0)

## 2010-08-12 LAB — CBC
MCH: 29.8 pg (ref 26.0–34.0)
MCV: 93.1 fL (ref 78.0–100.0)
Platelets: 232 10*3/uL (ref 150–400)
RDW: 14.1 % (ref 11.5–15.5)
WBC: 17.3 10*3/uL — ABNORMAL HIGH (ref 4.0–10.5)

## 2010-08-13 ENCOUNTER — Inpatient Hospital Stay (HOSPITAL_COMMUNITY): Payer: Medicare Other

## 2010-08-13 DIAGNOSIS — M79609 Pain in unspecified limb: Secondary | ICD-10-CM

## 2010-08-13 LAB — URINALYSIS, ROUTINE W REFLEX MICROSCOPIC
Bilirubin Urine: NEGATIVE
Glucose, UA: NEGATIVE mg/dL
Hgb urine dipstick: NEGATIVE
Ketones, ur: NEGATIVE mg/dL
pH: 5 (ref 5.0–8.0)

## 2010-08-13 LAB — BASIC METABOLIC PANEL
BUN: 13 mg/dL (ref 6–23)
CO2: 23 mEq/L (ref 19–32)
Chloride: 101 mEq/L (ref 96–112)
Creatinine, Ser: 0.91 mg/dL (ref 0.50–1.10)
Glucose, Bld: 175 mg/dL — ABNORMAL HIGH (ref 70–99)

## 2010-08-13 LAB — CBC
HCT: 26.9 % — ABNORMAL LOW (ref 36.0–46.0)
Hemoglobin: 8.5 g/dL — ABNORMAL LOW (ref 12.0–15.0)
MCV: 92.4 fL (ref 78.0–100.0)
RBC: 2.91 MIL/uL — ABNORMAL LOW (ref 3.87–5.11)
WBC: 27.4 10*3/uL — ABNORMAL HIGH (ref 4.0–10.5)

## 2010-08-13 LAB — URINE MICROSCOPIC-ADD ON

## 2010-08-14 LAB — GLUCOSE, CAPILLARY: Glucose-Capillary: 134 mg/dL — ABNORMAL HIGH (ref 70–99)

## 2010-08-14 LAB — CBC
MCV: 91.1 fL (ref 78.0–100.0)
Platelets: 209 10*3/uL (ref 150–400)
RDW: 13.9 % (ref 11.5–15.5)
WBC: 19.1 10*3/uL — ABNORMAL HIGH (ref 4.0–10.5)

## 2010-08-15 LAB — CROSSMATCH: Unit division: 0

## 2010-08-15 LAB — URINE CULTURE
Culture  Setup Time: 201208091408
Special Requests: NEGATIVE

## 2010-08-15 LAB — CBC
MCH: 29.4 pg (ref 26.0–34.0)
MCHC: 32.6 g/dL (ref 30.0–36.0)
MCV: 90.2 fL (ref 78.0–100.0)
Platelets: 234 10*3/uL (ref 150–400)
RDW: 15.3 % (ref 11.5–15.5)
WBC: 15.9 10*3/uL — ABNORMAL HIGH (ref 4.0–10.5)

## 2010-08-16 LAB — GLUCOSE, CAPILLARY
Glucose-Capillary: 241 mg/dL — ABNORMAL HIGH (ref 70–99)
Glucose-Capillary: 142 mg/dL — ABNORMAL HIGH (ref 70–99)
Glucose-Capillary: 166 mg/dL — ABNORMAL HIGH (ref 70–99)

## 2010-08-16 LAB — HEMOGLOBIN AND HEMATOCRIT, BLOOD: Hemoglobin: 9.3 g/dL — ABNORMAL LOW (ref 12.0–15.0)

## 2010-08-17 LAB — GLUCOSE, CAPILLARY: Glucose-Capillary: 125 mg/dL — ABNORMAL HIGH (ref 70–99)

## 2010-08-17 NOTE — Discharge Summary (Signed)
[name], [name] NO.:  [id number]  MEDICAL RECORD NO.:  [id number]  LOCATION:  [id number]                         FACILITY:  [facility]  PHYSICIAN:  [name], M.D.DATE OF BIRTH:  1940-06-20  DATE OF ADMISSION:  08/11/2010 DATE OF DISCHARGE:                              DISCHARGE SUMMARY   ADMISSION DIAGNOSES: 1. End-stage osteoarthritis left knee with varus deformity. 2. Obesity. 3. Hypertension. 4. Hypercholesterolemia. 5. Reflux disease. 6. Diabetes. 7. History of thyroid cancer, status post thyroidectomy. 8. She has a history of shingles. 9. History of lumbar surgery with resulting soft tissue infection,     currently on doxycycline long-term being treated through Dr. Tommy Medal, Infectious Disease office.  DISCHARGE DIAGNOSES: 1. Left total knee arthroplasty. 2. Acute postoperative blood loss anemia, symptomatic, will be     corrected with 2 units of packed red blood cells. 3. Acute postoperative leukocytosis, workup resulted in urinary tract     infection, improved with Cipro. 4. Obesity. 5. Hypertension. 6. Hypercholesterolemia. 7. Reflux disease. 8. Diabetes. 9. History of thyroid cancer. 10.History of shingles. 11.History of lumbar soft tissue infection, on chronic doxycycline     treatment through Dr. Lucianne Lei Dam's infectious disease office.  HISTORY OF PRESENT ILLNESS:  The patient is a 70 year old female with end-stage osteoarthritis of her left knee.  She does have a history of right total knee arthroplasty by Dr. Theda Sers previously.  The patient has failed conservative treatment including injections and physical therapy.  The patient elected to proceed with a total knee arthroplasty on the left.  The patient was medically cleared by her primary care physician and infectious disease services.  PRIMARY CARE PHYSICIAN:  [name], M.D.  CARDIOLOGIST:  [name], M.D.  INFECTIOUS DISEASE:  Through Dr. Tommy Medal, [facility] office.  ALLERGIES:  No known drug allergies.  MEDICATIONS ON ADMISSION: 1. Norvasc 5 mg a day. 2. Lipitor 20 mg a day. 3. Calcium plus D. 4. Os-Cal 500 one tablet 3 times a day. 5. Aggrenox 1 capsule twice a day, she stopped preoperatively.  She     will not be restarted on that until she has completed her Lovenox     therapy. 6. Lexapro 10 mg a day. 7. Microzide 12.5 mg once a day. 8. Lantus 25 mg subcu at bedtime. 9. Synthroid 150 mcg a day. 10.Lisinopril 40 mg 2 tablets a day. 11.Claritin 100 mg once a day. 12.Glucophage 1000 mg 2 times a day with meals. 13.Mycostatin ointment twice a day. 14.Omeprazole 40 mg twice a day. 15.Pediatric multivitamins 1 tablet a day. 16.Lyrica 75 mg 1 tablet twice a day. 17.Zantac 500 mg once a day. 18.Valtrex 1000 mg twice a day p.r.n. 19.Fosamax 75 mg once every 7 days. 20.Flector patch twice a day.  SURGICAL PROCEDURES:  On August 11, 2010, the patient was taken the OR by Dr. Hart Robinsons assisted by Rutherford Limerick, PA-C.  Under general and regional anesthesia, the patient underwent a left total knee arthroplasty without any complications.  Tourniquet was used.  Minimal blood loss was incurred.  There were no complications.  The patient was transferred to the recovery  room in good condition.  In the recovery room, the patient had a strong 1+ dorsalis pedis pulse.  She also had strong dorsi and plantar flexion of the ankle on the left.  The patient had the following components implanted:  A sized #4 narrow femoral Sigma component, a sized #4 keel tibial tray, a size #38 three peg patella, a size #4 10-mm thickness polyethylene bearing.  All components were implanted, polymethyl methacrylate along with gentamicin bone cement.  CONSULTS:  The following routine consult requested physical therapy and case management.  HOSPITAL COURSE:  On August 11, 2010, the patient was admitted to [facility] under the care of  Dr. Hart Robinsons.  The patient was taken to the OR where a left total knee arthroplasty was performed without any complications.  The patient was transferred to the recovery room and then to the orthopedic floor in good condition to follow a total knee protocol with IV antibiotics, pain medicines, and Lovenox for DVT prophylaxis.  Postop day #1, the patient's hemoglobin did drop to 8.2 and hematocrit of 25.6.  She had a white count of 17.3.  The patient's vital signs were stable.  She had no complaints.  No shortness of breath or dizziness. Her wound dressing was intact.  Hemovac drain was discontinued, in good condition.  She was neurologically intact in the lower extremities.  She followed physical therapy per protocol without any complications.  Postop day #2, the patient's hemoglobin stabilized at 8.5 and hematocrit 26.9.  Her white count did drop to 27.4.  Workup showed that she had a little atelectasis on her chest x-ray.  She did develop a urinary tract infection.  She was placed on Cipro for this.  The patient had no complaints of shortness of breath, lightheadedness, or dizziness.  Her chemistries were within normal limits.  Her dressing was taken down. Her wound looked very nice.  No signs of infection.  No wound problems. No blisters.  Her thigh was soft and nontender.  She had quite a bit of discomfort in her calf, so a Doppler was done and it was negative for any DVT.  Postop day #3, the patient did complain of being weak all day yesterday. Her hemoglobin did drop down further to 7.6 and hematocrit 23.54.  Her white count improved to 19.1.  Her blood pressure is 102/63.  She was a little tachycardic today previously with a heart rate about 100, so it was felt at that time she would benefit for a transfusion.  This was discussed with the patient.  She would like to proceed, so she will be transfused day with 2 units of packed red blood cells.  The patient's wound  remained benign for any signs of infection.  Her leg remained neuromotor vascularly intact.  Her calf was less sore.  Her thigh was soft and nontender.  The patient had been tolerating food well without any issues.  She was using the commode to pass bowel movements and urine.  The patient at this time will be ready for skilled nursing placement at [facility].  On postop day #4, we will hold off on transfer since she is getting blood sedated.  If there are no other further complications, she will be transferred unit in good condition with discharge instructions and follow up.  LABORATORY DATA:  Preoperative labs for MRSA and staph aureus were all negative.  INR on admission was 0.94, APTT was 34.  Routine chemistries on admission showed sodium of 138, potassium  of 4.2, BUN 24, creatinine 0.73.  Routine chemistries on admission found WBCs 13.4, hemoglobin 10.5, hematocrit 33.0 with platelets of 331.  Urinalysis on admission was totally unremarkable.  CBC on postop day #3 found WBCs of 19.7 down from 27,000 the day prior, hemoglobin 7.6, hematocrit 23.5, and platelets 209.  Postop day #2; chemistries found sodium of 137, potassium of 3.9, BUN 13, creatinine 0.91 with 175 glucose.  Chest x-rayon postop day #2 showed she had low insufflation of her lung volume. She did have a little atelectasis in the bases.  No signs of pneumonia. Urinalysis on postop day #2 did show a leukocyte esterase, bacteria, few wbc's, no nitrites.  DISCHARGE INSTRUCTIONS: 1. Activity.  The patient may be weightbearing as tolerated.  Increase     activity with the use of a walker and instructions with physical     therapy. 2. CPM 0-50 degrees initially for 6-8 hours a day increasing 10     degrees a day.  The goal is to be at 90 degrees by 1st postop     check. 3. Diet:  Standard diabetic ADA diet requested. 4. Wound care:  The patient is to have her wound checked daily.  She     is to have dressing changed  daily.  She is to keep the wound dry     and clean.  She may shower or bathe, but the wound is to be wrapped     and kept dry. 5. Follow up.  She meeds a 2-week follow up appointment with Dr.     Theda Sers.  Please call [phone] for that followup appointment.  DISCHARGE MEDICATIONS: 1. Calcitriol 0.5 mcg twice a day. 2. Cipro 500 mg 1 tablet twice a day for 5 days starting on Saturday,     August 10th. 3. Lovenox 30 mg subcu q.12 h. for a total of 14 days. 4. Robaxin 500 mg 1 tablet every 6 hours for muscle spasms if needed. 5. OxyIR 5 mg 1 or 2 tablets every 4-6 hours for pain if needed. 6. Amlodipine 2.5 mg 2 tablets daily at bedtime. 7. Calcium carbonate with vitamin D 500 mg one tablet twice a day. 8. Doxycycline 100 mg twice a day until stopped by Dr. Tommy Medal. 9. Hydrochlorothiazide 12.5 mg once a day. 10.Iron 325 mg 2 tablets daily. 11.Lexapro 10 mg once a day. 12.Lipitor 10 mg a day. 13.Lantus 25 units subcu at bedtime. 14.Lisinopril 40 mg 2 tablets every morning. 15.Lyrica 75 mg 1 tablet twice a day. 16.Multivitamins daily. 17.Metformin 1000 mg 1 tablet twice a day with meals. 18.Nystatin topical to the rash 3 times a day p.r.n. 19.Omeprazole 40 mg one tablet twice a day. 20.Synthroid 150 mcg daily. 21.Tylenol Extra Strength as needed. 22.Valtrex 1 g tablet as needed. 23.Aggrenox 25/200, on hold until have completed her Lovenox therapy     and then reinitiate twice a day. 24.Hydrocodone, on hold until off OxyIR. 25.Bupropion topical, discontinued.  The patient is expected to be discharged home to [facility] on Saturday, August 10th in good condition.  Any changes in discharge instructions will be addressed with an addendum.     [name], P.A.   ______________________________ [name], M.D.    RWK/MEDQ  D:  08/14/2010  T:  08/14/2010  Job:  [id number]  cc:   Alcide Evener, MD Fax: [phone]  [name],  M.D. Fax: [phone]  [name], M.D. Fax: [phone]  [name], M.D.  Fax: [phone]  Electronically Signed by Lorretta Harp P.A. on 08/17/2010 01:28:21 PM Electronically Signed by Sydnee Cabal M.D. on 08/17/2010 03:39:15 PM

## 2010-08-17 NOTE — H&P (Signed)
[name], [name] NO.:  [id number]  MEDICAL RECORD NO.:  [id number]  LOCATION:                                 FACILITY:  PHYSICIAN:  [name], M.D.DATE OF BIRTH:  1940/12/28  DATE OF ADMISSION:  08/11/2010 DATE OF DISCHARGE:                             HISTORY & PHYSICAL   PREADMISSION HISTORY  CHIEF COMPLAINT:  Painful loss of range of motion, left knee.  HISTORY OF PRESENT ILLNESS:  The patient is a 70 year old female with end-stage osteoarthritis of her left knee.  The patient has failed conservative treatment including previous cortisone injections, viscous supplement injections, physical therapy.  X-rays reveal she has got end- stage osteoarthritis, valgus deformity.  The patient has elected to proceed with a total knee arthroplasty on the left.  The patient does have a history of significant staph infection in the past, been treated by Infectious Disease, Dr. Tommy Medal.  He, at this time, feels she is an appropriate candidate for a total knee arthroplasty with preop decontamination protocol.  The patient has already started on that protocol.  The patient has also received medical clearance from her primary care physician, Dr. Ernie Hew and Dr. Martinique.  ALLERGIES:  No known drug allergies.  CURRENT MEDICATIONS: 1. Norvasc 5 mg once a day. 2. Lipitor 20 mg a day. 3. Calcium plus vitamin D. 4. Os-Cal 500, 1 tablet 3 times a day. 5. Aggrenox 1 capsule twice a day, she has already stopped this     preoperatively. 6. Lexapro 10 mg a day. 7. Microzide 12.5 mg once a day. 8. Lantus 25 units subcu at bedtime. 9. Synthroid 150 mcg a day. 10.Lisinopril 40 mg 2 tablets once a day. 11.Claritin 10 mg once a day. 12.Glucophage 1000 mg 2 times a day with meals. 13.Mycostatin ointment twice a day. 14.Omeprazole 40 mg twice a day. 15.Pediatric multivitamins 1 tablet a day. 16.Lyrica 75 mg 1 tablet twice a day. 17.Zantac 300 mg once a  day. 18.Valtrex 1000 mg 2 times daily. 19.Fosamax 75 mg once every 7 days. 20.Flector patch twice a day.  CURRENT MEDICAL HISTORY: 1. Hypertension. 2. Diabetes. 3. Thyroid disease with a history of thyroid cancer. 4. Allergies. 5. Allergic rhinitis. 6. History of herpes simplex. 7. History of chronic anemia. 8. History of polyneuropathies due to diabetes. 9. Anxiety disorder. 10.Dyslipidemia. 11.Reflux disease. 12.Obesity.  REVIEW OF SYSTEMS:  Negative for any migraine or routine headaches.  She has had a history of stroke affecting her right lower thoracic region, has improved.  She has not lost any use of her right upper or lower extremities.  She denies any history of alcohol or drug problems.  She does have upper and lower dentures.  PULMONARY:  As a child in younger age, she had some asthma and bronchitis, but nothing recently.  She denies any shortness of breath, productive cough, wheezing, no sleep apnea, history of tuberculosis.  CARDIOVASCULAR:  She does have some blood pressures, is on multiple medications for this.  She denies any chest pains, irregular heart rhythms.  No previous heart attacks.  GI: She does have significant reflux, is on medications for this, is fairly well controlled.  She  does have a history of gallbladder and gallstones, and had her gallbladder removed.  She denies any hepatitis, no jaundice, no problems swallowing foods, no history of pancreatitis, or diverticular-type problems.  GU:  She denies any issues related to her urinary incontinence, kidney stones, urinary infections.  ENDOCRINE: She does have a history of thyroid cancer.  She had a thyroidectomy. She is on medications, well controlled.  She is fairly well controlled with diabetes.  She normally runs in the 80s and 90s in the mornings and 150s in the late afternoons.  PAST SURGICAL HISTORY:  Thyroidectomy x2, hysterectomy, gallbladder, rotator cuff repair, right total knee  arthroplasty, multiple back surgeries with a skin infection, treated with infectious disease.  FAMILY MEDICAL HISTORY:  Parents are deceased.  SOCIAL HISTORY:  Patient is widowed.  She never smokes.  No alcohol. She has got 5 children.  She lives alone.  She would like to go to a skilled nursing facility postoperatively.  PRIMARY CARE PHYSICIAN:  [name], M.D.  CARDIOLOGIST:  [name], M.D.  PHYSICAL EXAMINATION:  VITAL SIGNS:  The patient is 5 feet 1 inch, 250 pounds.  Blood pressure is 138/64, pulse of 84 and regular, respirations are 12 and nonlabored. GENERAL:  The patient appears to be healthy, well-appearing well- developed female, is walking with a significant limp with a cane in her right hand. HEENT:  Head is normocephalic.  Pupils equal.  Gross hearing is intact. NECK:  Supple.  No palpable lymphadenopathy.  Good range of motion. CHEST:  Clear throughout. HEART:  Regular rate and rhythm. ABDOMEN:  Soft.  Bowel sounds present. EXTREMITIES:  She had good range of motion of both upper extremities with good motor strength.  Lower extremities, she had a well-healed midline surgical incision of her right knee with full extension and flexion back to 110 degrees today.  Calf was soft and nontender with good motion of the ankle.  Her left knee, range of motion is 5 degrees to 100 degrees.  She has a varus deformity.  She has got crepitus knee with motion.  Her calf is soft, nontender. PERIPHERAL VASCULAR:  Carotid pulses are 2+.  No bruits.  Radial pulses 2+, posterior tibial pulses are 1+.  She has trace edema in the lower extremities, but no signs of phlebitis. NEURO:  The patient is conscious, alert, and appropriate.  IMPRESSION: 1. End-stage osteoarthritis, left knee, with varus deformity. 2. Obesity. 3. Hypertension. 4. Hypercholesterolemia. 5. Reflux disease. 6. Diabetes. 7. History of thyroid cancer, status post thyroidectomy. 8. History of  shingles. 9. History of lumbar soft tissue skin infection, treated by Dr. Tommy Medal of infectious disease department, status post lumbar surgery,     infectious agent unknown, cleared for surgery.  PLAN:  At this time after reviewing the patient's findings, the patient will undergo all routine labs and tests prior to having a left total knee arthroplasty by Dr. Theda Sers at [facility] on August 11, 2010.  The patient had all questions reviewed.  The patient has been cleared by her primary care physician, her cardiologist, Dr. Tommy Medal. She will be covered under the decontamination protocol preoperatively and will watch her closely postop.  We will get an internal medicine hospitalist consult while she is in the hospital postoperatively.  Due to her multiple medical issues, she would like to go to a skilled nursing facility postoperative.     Evert Kohl, P.A.   ______________________________ [name], M.D.  RWK/MEDQ  D:  08/05/2010  T:  08/06/2010  Job:  [id number]  Electronically Signed by Lorretta Harp P.A. on 08/14/2010 08:24:35 AM Electronically Signed by Sydnee Cabal M.D. on 08/17/2010 03:39:08 PM

## 2010-08-17 NOTE — Op Note (Signed)
NAMEVERSIA, DELAGUILA NO.:  [id number]  MEDICAL RECORD NO.:  [id number]  LOCATION:  [id number]                         FACILITY:  [facility]  PHYSICIAN:  [name], M.D.DATE OF BIRTH:  May 13, 1940  DATE OF PROCEDURE:  08/11/2010 DATE OF DISCHARGE:                              OPERATIVE REPORT   PREOPERATIVE DIAGNOSIS:  Left knee end-stage osteoarthritis.  POSTOPERATIVE DIAGNOSIS:  Left knee end-stage osteoarthritis.  PROCEDURE:  Left total knee arthroplasty.  SURGEON:  [name], M.D.  ASSISTANT:  Evert Kohl, P.A-C.  ANESTHESIA:  Femoral nerve block with general.  BLOOD LOSS:  Less than 100 mL.  DRAIN:  Hemovac.  COMPLICATIONS:  None.  TOURNIQUET TIME:  80 minutes at 300 mmHg.  DISPOSITION:  PACU stable.  OPERATIVE IMPLANTS:  Wynetta Emery and [employer] Sigma size 4 narrow femur, size 4 tibia, 10 mm posterior stabilized rotating platform tibial insert, and 38-mm all polyethylene oval dome patella, all cemented.  OPERATIVE DETAIL:  The patient was counseled in the holding area.  IV had been started.  Chart reviewed and signed appropriately.  Correct site was identified and marked by myself.  Femoral nerve block was administered per the Anesthesiology staff.  She was then taken to the operating room.  Due to her history of a previous staph infection of the spine and with discussion of Infectious Disease doctor preoperatively, we decided to proceed with vancomycin.  Vancomycin 1000 mg was then slowly infused.  In the operating room, she was placed under general anesthesia.  All extremities were well padded and bumped.  TED hose was applied in an uninvolved side.  Left lower extremity was elevated.  She had a 5 degree flexion contracture.  She could flex to 110 degrees using valgus.  She was prepped with DuraPrep, draped in sterile fashion.  A standard Esmarch tourniquet inflated to 250 mmHg, draped in sterile fashion.  Appropriate  time-out was done in a standard protocol and confirmed correct surgical side, procedure, etc.  Following this, the leg was exsanguinated with an Esmarch.  Tourniquet was inflated to 300 mmHg.  A straight midline incision was made to skin and subcutaneous tissue.  Medial soft tissue flaps developed at appropriate level. Medial parapatellar arthrotomy was performed and then patella was tracked down the way.  The knee was flexed.  She had end-stage arthritis changes, bone against bone.  Cruciate ligaments were resected.  Starter hole made in distal femur.  Canal was irrigated and the effluent was clear.  Intramedullary rod was gently placed, I chose a 5 degree valgus cut, took a 10 mm cut off distal femur due to flexion contracture, which was found be size #4.  Rotation markers set and cutting block applied for size #4.  Medial and lateral menisci removed under direct visualization.  Genicular vessels were coagulated.  Posterior neurovascular structures were followed throughout the entire case. Tibia subluxed anteriorly.  Extramedullary alignment was used on the tibia, we took 8 mm cut off the patient's medial side at 0 degree slope. Posterior medial and posterior femoral osteophytes were removed. Flexion/extension blocks for 10 insert well balanced in flexion and extension.  We also noted some right side lateral  release due to her valgus malalignment.  We had well-balanced knee.  Knee was then flexed. Tibia subluxed anteriorly and found be a size 4, rotation cover set, reamer punch was performed and femoral box cut now to a size 4, narrow femur, size 4 tibia, 10 inch well-aligned, well-balanced knee, flexion and extension gaps, varus-valgus stress, 0-36 and 90 degrees of flexion. Patella was found to be size 38, appropriate amount of bone was resected.  Lug holes were made and patellar button tracked anatomically. All trials were removed.  Utilizing modern cement technique, all components  were cemented in place.  Due to history of previous infection, we used antibiotic impregnated cement.  At this time, we cemented in and placed a 4 tibia, 4 narrow femur, 38 patella with a 10 trial insert.  After the cement cured, excess cement was removed, again checked the knee and with a 10 insert, were well-balanced.  Knee was then subluxed anteriorly.  Again irrigated.  Gelfoam was placed posteriorly and 10 mm posterior stabilized rotating platform tibial insert was applied and knee was reduced and set.  We had a well- aligned,well-balanced knee at this point, and patella tracked anatomically.  A medium Hemovac drain was placed.  At 90 degrees of flexion, arthrotomy was closed with Vicryl suture in interrupted figure- of-8 fashion.  Subcu with Vicryl, skin with subcu Monocryl suture. Steri-Strips applied.  Drain hooked to suction.  TED hose, ice packs, and knee immobilizer in extension.  She tolerated the procedure with no complications or problems.  She was awakened.  She taken to recovery room in stable condition.  Sponge and needle count were correct.  To help with surgical technique and decision making, Mr. Rutherford Limerick, PA assistance was needed throughout the entire case.          ______________________________ [name], M.D.     RAC/MEDQ  D:  08/11/2010  T:  08/12/2010  Job:  [id number]  Electronically Signed by Sydnee Cabal M.D. on 08/17/2010 03:39:10 PM

## 2010-09-25 LAB — GLUCOSE, RANDOM: Glucose, Bld: 136 — ABNORMAL HIGH

## 2010-09-25 LAB — CBC
HCT: 29.1 — ABNORMAL LOW
HCT: 29.8 — ABNORMAL LOW
HCT: 34.7 — ABNORMAL LOW
Hemoglobin: 10.1 — ABNORMAL LOW
Hemoglobin: 11.7 — ABNORMAL LOW
MCHC: 33.4
MCV: 88.6
MCV: 88.8
MCV: 89
Platelets: 256
Platelets: 276
Platelets: 327
Platelets: 349
RBC: 3.35 — ABNORMAL LOW
RDW: 13
RDW: 12.9
WBC: 15 — ABNORMAL HIGH
WBC: 29.3 — ABNORMAL HIGH
WBC: 18.2 — ABNORMAL HIGH

## 2010-09-25 LAB — CULTURE, BLOOD (ROUTINE X 2): Culture: NO GROWTH

## 2010-09-25 LAB — URINE MICROSCOPIC-ADD ON

## 2010-09-25 LAB — DIFFERENTIAL
Basophils Absolute: 0
Basophils Relative: 0
Eosinophils Absolute: 0.2
Eosinophils Relative: 0
Lymphs Abs: 0.9
Lymphs Abs: 1.5
Monocytes Absolute: 0.9
Monocytes Absolute: 0 — ABNORMAL LOW
Monocytes Relative: 3
Neutro Abs: 27.5 — ABNORMAL HIGH
Neutrophils Relative %: 91 — ABNORMAL HIGH
WBC Morphology: INCREASED
WBC Morphology: INCREASED

## 2010-09-25 LAB — BASIC METABOLIC PANEL
BUN: 11
BUN: 12
BUN: 8
Chloride: 101
Chloride: 103
Chloride: 98
Creatinine, Ser: 0.82
GFR calc Af Amer: >60
GFR calc non Af Amer: >60
GFR calc non Af Amer: >60
Glucose, Bld: 240 — ABNORMAL HIGH
Potassium: 3.2 — ABNORMAL LOW
Potassium: 3.9
Potassium: 4.4
Sodium: 138
Sodium: 140

## 2010-09-25 LAB — URINALYSIS, ROUTINE W REFLEX MICROSCOPIC
Bilirubin Urine: NEGATIVE
Glucose, UA: NEGATIVE
Ketones, ur: NEGATIVE
Leukocytes, UA: NEGATIVE
Nitrite: NEGATIVE
Specific Gravity, Urine: 1.015
pH: 5

## 2010-09-25 LAB — URINE CULTURE
Colony Count: NO GROWTH
Special Requests: NEGATIVE

## 2010-09-25 LAB — TYPE AND SCREEN

## 2010-09-25 LAB — ABO/RH: ABO/RH(D): AB POS

## 2010-09-28 DIAGNOSIS — G250 Essential tremor: Secondary | ICD-10-CM | POA: Insufficient documentation

## 2010-09-30 LAB — BASIC METABOLIC PANEL
CO2: 29
CO2: 30
Calcium: 7.2 — ABNORMAL LOW
Calcium: 7.3 — ABNORMAL LOW
Chloride: 104
Chloride: 105
Creatinine, Ser: 0.56
Creatinine, Ser: 0.58
GFR calc Af Amer: >60
GFR calc Af Amer: >60
GFR calc Af Amer: >60
GFR calc non Af Amer: >60
Glucose, Bld: 131 — ABNORMAL HIGH
Potassium: 3.7
Potassium: 3.8
Sodium: 135
Sodium: 140

## 2010-09-30 LAB — TISSUE CULTURE: Culture: NO GROWTH

## 2010-09-30 LAB — DIFFERENTIAL
Basophils Absolute: 0
Basophils Absolute: 0.1
Eosinophils Relative: 1
Lymphocytes Relative: 5 — ABNORMAL LOW
Lymphocytes Relative: 9 — ABNORMAL LOW
Lymphs Abs: 1.2
Lymphs Abs: 1.5
Monocytes Absolute: 0.7
Monocytes Absolute: 1
Monocytes Relative: 4
Monocytes Relative: 5
Neutro Abs: 13.2 — ABNORMAL HIGH
Neutro Abs: 22.1 — ABNORMAL HIGH

## 2010-09-30 LAB — URINALYSIS, ROUTINE W REFLEX MICROSCOPIC
Glucose, UA: NEGATIVE
Glucose, UA: NEGATIVE
Ketones, ur: 40 — AB
Ketones, ur: NEGATIVE
Nitrite: NEGATIVE
Protein, ur: NEGATIVE
Protein, ur: NEGATIVE
Specific Gravity, Urine: 1.022
Urobilinogen, UA: 0.2
Urobilinogen, UA: 0.2
pH: 5

## 2010-09-30 LAB — CBC
HCT: 25.1 — ABNORMAL LOW
HCT: 26.4 — ABNORMAL LOW
HCT: 27.5 — ABNORMAL LOW
Hemoglobin: 8.2 — ABNORMAL LOW
Hemoglobin: 8.8 — ABNORMAL LOW
Hemoglobin: 9 — ABNORMAL LOW
MCHC: 32.8
MCHC: 32.9
MCV: 81.8
MCV: 82.2
RBC: 3.05 — ABNORMAL LOW
RBC: 3.12 — ABNORMAL LOW
RBC: 3.22 — ABNORMAL LOW
RBC: 3.32 — ABNORMAL LOW
RDW: 14.5
RDW: 14.7
WBC: 15.7 — ABNORMAL HIGH
WBC: 16.3 — ABNORMAL HIGH

## 2010-09-30 LAB — COMPREHENSIVE METABOLIC PANEL
AST: 26
Albumin: 2.4 — ABNORMAL LOW
BUN: 10
Calcium: 7.6 — ABNORMAL LOW
Creatinine, Ser: 0.68
GFR calc Af Amer: >60
Total Bilirubin: 0.4
Total Protein: 6.4

## 2010-09-30 LAB — URINE CULTURE
Colony Count: NO GROWTH
Culture: NO GROWTH
Culture: NO GROWTH
Special Requests: NEGATIVE

## 2010-09-30 LAB — CK TOTAL AND CKMB (NOT AT ARMC)
CK, MB: 0.7
CK, MB: 0.8
CK, MB: 0.9
Relative Index: INVALID
Relative Index: INVALID
Total CK: 57

## 2010-09-30 LAB — CULTURE, BLOOD (ROUTINE X 2)
Culture: NO GROWTH
Culture: NO GROWTH

## 2010-09-30 LAB — URINE MICROSCOPIC-ADD ON

## 2010-09-30 LAB — CLOSTRIDIUM DIFFICILE EIA: C difficile Toxins A+B, EIA: NEGATIVE

## 2010-09-30 LAB — TROPONIN I: Troponin I: 0.02

## 2010-09-30 LAB — VANCOMYCIN, TROUGH: Vancomycin Tr: 18.6

## 2010-09-30 LAB — ANAEROBIC CULTURE

## 2010-10-19 IMAGING — NM NM BONE 3 PHASE
2 series · 12 of 12 positions shown · non-contrast
Comparison: None.

CLINICAL DATA: Post right knee replacement.  Evaluate for
prosthetic loosening

NUCLEAR MEDICINE THREE PHASE BONE SCAN
TECHNIQUE: Radionuclide angiographic images, immediate static
blood pool images, and 3-hour delayed static images were obtained
after intravenous injection of radiopharmaceutical.
Radiopharmaceutical: 25.7 mCi technetium MDP

[3 phase bone · 9.33mm/px · 6 of 48 frames shown (1 of 2)]
[frame 5/48  full-range]
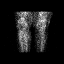
[frame 13/48  full-range]
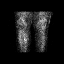
[frame 21/48  full-range]
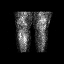
[frame 29/48  full-range]
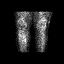
[frame 37/48  full-range]
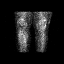
[frame 45/48  full-range]
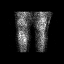

[3 phase bone · 9.33mm/px · 6 of 48 frames shown (2 of 2)]
[frame 5/48  full-range]
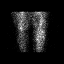
[frame 13/48  full-range]
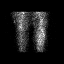
[frame 21/48  full-range]
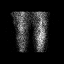
[frame 29/48  full-range]
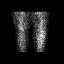
[frame 37/48  full-range]
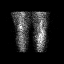
[frame 45/48  full-range]
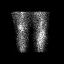

[12 of 12 positions shown; findings below may reference images not displayed]

FINDINGS: No definite  hyperemia of the right knee.  There is
positivity of the right knee in the blood pool and static images,
but the pattern   is more suggestive of an arthritic process than
prosthetic loosening.  Dr. Berisha  reviewed this case and
concurred.

There is also significant uptake on the left in the blood pool and
static phases, consistent with osteoarthritis.
IMPRESSION: 1.  Status post right knee replacement - no definite scintigraphic
evidence for prosthetic loosening.  There are significant
degenerative changes.
2.  Degenerative changes of the left knee are noted.

## 2010-12-16 ENCOUNTER — Other Ambulatory Visit: Payer: Self-pay | Admitting: Family Medicine

## 2010-12-16 DIAGNOSIS — Z1231 Encounter for screening mammogram for malignant neoplasm of breast: Secondary | ICD-10-CM

## 2011-01-12 ENCOUNTER — Ambulatory Visit: Payer: Medicare Other

## 2011-01-28 ENCOUNTER — Other Ambulatory Visit (HOSPITAL_COMMUNITY): Payer: Self-pay | Admitting: Endocrinology

## 2011-01-28 DIAGNOSIS — C73 Malignant neoplasm of thyroid gland: Secondary | ICD-10-CM

## 2011-02-01 ENCOUNTER — Ambulatory Visit
Admission: RE | Admit: 2011-02-01 | Discharge: 2011-02-01 | Disposition: A | Discharge status: Home / Self Care | Payer: Medicare Other | Source: Ambulatory Visit | Attending: Family Medicine | Admitting: Family Medicine

## 2011-02-01 DIAGNOSIS — Z1231 Encounter for screening mammogram for malignant neoplasm of breast: Secondary | ICD-10-CM

## 2011-03-01 ENCOUNTER — Encounter (HOSPITAL_COMMUNITY)
Admission: RE | Admit: 2011-03-01 | Discharge: 2011-03-01 | Disposition: A | Discharge status: Home / Self Care | Payer: Medicare Other | Source: Ambulatory Visit | Attending: Endocrinology | Admitting: Endocrinology

## 2011-03-01 DIAGNOSIS — C73 Malignant neoplasm of thyroid gland: Secondary | ICD-10-CM | POA: Insufficient documentation

## 2011-03-01 MED ORDER — THYROTROPIN ALFA 1.1 MG IM SOLR
0.9000 mg | INTRAMUSCULAR | Status: DC
  Filled 2011-03-01: qty 0.9

## 2011-03-02 ENCOUNTER — Ambulatory Visit (HOSPITAL_COMMUNITY)
Admission: RE | Admit: 2011-03-02 | Discharge: 2011-03-02 | Disposition: A | Discharge status: Home / Self Care | Payer: Medicare Other | Source: Ambulatory Visit | Attending: Endocrinology | Admitting: Endocrinology

## 2011-03-02 DIAGNOSIS — Z09 Encounter for follow-up examination after completed treatment for conditions other than malignant neoplasm: Secondary | ICD-10-CM | POA: Insufficient documentation

## 2011-03-02 DIAGNOSIS — Z85850 Personal history of malignant neoplasm of thyroid: Secondary | ICD-10-CM | POA: Insufficient documentation

## 2011-03-02 DIAGNOSIS — E0789 Other specified disorders of thyroid: Secondary | ICD-10-CM | POA: Insufficient documentation

## 2011-03-02 MED ORDER — THYROTROPIN ALFA 1.1 MG IM SOLR
0.9000 mg | INTRAMUSCULAR | Status: DC
  Filled 2011-03-02: qty 0.9

## 2011-03-03 ENCOUNTER — Encounter (HOSPITAL_COMMUNITY)
Admission: RE | Admit: 2011-03-03 | Discharge: 2011-03-03 | Disposition: A | Discharge status: Home / Self Care | Payer: Medicare Other | Source: Ambulatory Visit | Attending: Endocrinology | Admitting: Endocrinology

## 2011-03-03 DIAGNOSIS — C73 Malignant neoplasm of thyroid gland: Secondary | ICD-10-CM | POA: Insufficient documentation

## 2011-03-05 ENCOUNTER — Encounter (HOSPITAL_COMMUNITY): Payer: Self-pay

## 2011-03-05 ENCOUNTER — Encounter (HOSPITAL_COMMUNITY)
Admission: RE | Admit: 2011-03-05 | Discharge: 2011-03-05 | Disposition: A | Discharge status: Home / Self Care | Payer: Medicare Other | Source: Ambulatory Visit | Attending: Endocrinology | Admitting: Endocrinology

## 2011-03-05 MED ORDER — SODIUM IODIDE I 131 CAPSULE: 4.1000 | Freq: Once | INTRAVENOUS | Status: AC | PRN

## 2011-05-11 ENCOUNTER — Other Ambulatory Visit: Payer: Self-pay | Admitting: Licensed Clinical Social Worker

## 2011-05-11 DIAGNOSIS — L0291 Cutaneous abscess, unspecified: Secondary | ICD-10-CM

## 2011-05-11 MED ORDER — DOXYCYCLINE HYCLATE 100 MG PO TBEC: 100.0000 mg | DELAYED_RELEASE_TABLET | Freq: Two times a day (BID) | ORAL | Status: DC

## 2011-05-20 ENCOUNTER — Ambulatory Visit: Payer: Medicare Other | Admitting: Infectious Disease

## 2011-05-28 ENCOUNTER — Encounter: Payer: Self-pay | Admitting: Infectious Disease

## 2011-05-28 ENCOUNTER — Ambulatory Visit (INDEPENDENT_AMBULATORY_CARE_PROVIDER_SITE_OTHER): Payer: Medicare Other | Admitting: Infectious Disease

## 2011-05-28 VITALS — BP 149/74 | HR 94 | Temp 98.2°F | Ht 65.0 in | Wt 251.0 lb

## 2011-05-28 DIAGNOSIS — M869 Osteomyelitis, unspecified: Secondary | ICD-10-CM

## 2011-05-28 DIAGNOSIS — K6812 Psoas muscle abscess: Secondary | ICD-10-CM

## 2011-05-28 LAB — BASIC METABOLIC PANEL WITH GFR
BUN: 25 mg/dL — ABNORMAL HIGH (ref 6–23)
Chloride: 105 mEq/L (ref 96–112)
Creat: 1 mg/dL (ref 0.50–1.10)
GFR, Est African American: 65 mL/min
Glucose, Bld: 157 mg/dL — ABNORMAL HIGH (ref 70–99)
Potassium: 4.5 mEq/L (ref 3.5–5.3)

## 2011-05-28 LAB — CBC WITH DIFFERENTIAL/PLATELET
Basophils Absolute: 0 10*3/uL (ref 0.0–0.1)
Basophils Relative: 0 % (ref 0–1)
Eosinophils Absolute: 0.1 10*3/uL (ref 0.0–0.7)
HCT: 31 % — ABNORMAL LOW (ref 36.0–46.0)
Hemoglobin: 10.1 g/dL — ABNORMAL LOW (ref 12.0–15.0)
MCH: 29.3 pg (ref 26.0–34.0)
MCHC: 32.6 g/dL (ref 30.0–36.0)
Monocytes Absolute: 0.5 10*3/uL (ref 0.1–1.0)
Monocytes Relative: 3 % (ref 3–12)
RDW: 14 % (ref 11.5–15.5)

## 2011-05-28 NOTE — Progress Notes (Signed)
  Subjective:    Patient ID: [name], female    DOB: 06/02/1940, 71 y.o.   MRN: [id number]  HPI  71 year old AA lady is s.p L4-5 decompression and fusion by Dr. [name] in February 2009 for severe spondylolisthesis, spinal stenosis with severe lower extremity pain. Pt then developed L3-4 diskitis, and epidural and paraspinal fluid collections in May 2009 when she underwent I and D by Dr. [name]. Her cultures on oral antibiotics failed to yield an organism. She was treated with aztreonama nd vancomycin, and later changed to oral suppressive rifampin and doxycycline. She had been on doxycyline for several years. Her ESR, CRP have been normal. Sheundwernt  left TKA by Dr. Theda Sers. I kept her on doxycline in the interim. She presents today for follow-up. She still does have chronic back pain esp with weight bearing but it has not worsened. She has minimal pain at rest. She is withotu fevers, chills or malaise.   Review of Systems  Constitutional: Negative for fever, chills, diaphoresis, activity change, appetite change, fatigue and unexpected weight change.  HENT: Negative for congestion, sore throat, rhinorrhea, sneezing, trouble swallowing and sinus pressure.   Eyes: Negative for photophobia and visual disturbance.  Respiratory: Negative for cough, chest tightness, shortness of breath, wheezing and stridor.   Cardiovascular: Negative for chest pain, palpitations and leg swelling.  Gastrointestinal: Negative for nausea, vomiting, abdominal pain, diarrhea, constipation, blood in stool, abdominal distention and anal bleeding.  Genitourinary: Negative for dysuria, hematuria, flank pain and difficulty urinating.  Musculoskeletal: Positive for back pain. Negative for myalgias, joint swelling, arthralgias and gait problem.  Skin: Negative for color change, pallor, rash and wound.  Neurological: Negative for dizziness, tremors, weakness and light-headedness.  Hematological: Negative for adenopathy.  Does not bruise/bleed easily.  Psychiatric/Behavioral: Negative for behavioral problems, confusion, sleep disturbance, dysphoric mood, decreased concentration and agitation.       Objective:   Physical Exam  Constitutional: She is oriented to person, place, and time. She appears well-developed and well-nourished. No distress.  HENT:  Head: Normocephalic and atraumatic.  Mouth/Throat: Oropharynx is clear and moist. No oropharyngeal exudate.  Eyes: Conjunctivae and EOM are normal. Pupils are equal, round, and reactive to light. No scleral icterus.  Neck: Normal range of motion. Neck supple. No JVD present.  Cardiovascular: Normal rate, regular rhythm and normal heart sounds.  Exam reveals no gallop and no friction rub.   No murmur heard. Pulmonary/Chest: Effort normal and breath sounds normal. No respiratory distress. She has no wheezes. She has no rales. She exhibits no tenderness.  Abdominal: She exhibits no distension and no mass. There is no tenderness. There is no rebound and no guarding.  Musculoskeletal: She exhibits no edema and no tenderness.       Arms:      Legs: Lymphadenopathy:    She has no cervical adenopathy.  Neurological: She is alert and oriented to person, place, and time. She has normal reflexes. She exhibits normal muscle tone. Coordination normal.  Skin: Skin is warm and dry. She is not diaphoretic. No erythema. No pallor.  Psychiatric: She has a normal mood and affect. Her behavior is normal. Judgment and thought content normal.          Assessment & Plan:  OTH INFS INVLV BONE DZ CLASS ELSW OTH SPEC SITES Check safety labs, esr and crp and stop the doxycyline rtc in 6 months  PSOAS MUSCLE ABSCESS resolved

## 2011-05-28 NOTE — Assessment & Plan Note (Signed)
resolved 

## 2011-05-28 NOTE — Assessment & Plan Note (Signed)
Check safety labs, esr and crp and stop the doxycyline rtc in 6 months

## 2011-09-21 ENCOUNTER — Other Ambulatory Visit (HOSPITAL_COMMUNITY): Payer: Self-pay | Admitting: Specialist

## 2011-09-21 ENCOUNTER — Other Ambulatory Visit (HOSPITAL_COMMUNITY): Payer: Self-pay | Admitting: Endocrinology

## 2011-09-21 DIAGNOSIS — M25561 Pain in right knee: Secondary | ICD-10-CM

## 2011-09-21 DIAGNOSIS — C73 Malignant neoplasm of thyroid gland: Secondary | ICD-10-CM

## 2011-09-27 ENCOUNTER — Encounter (HOSPITAL_COMMUNITY): Payer: Self-pay

## 2011-09-27 ENCOUNTER — Encounter (HOSPITAL_COMMUNITY)
Admission: RE | Admit: 2011-09-27 | Discharge: 2011-09-27 | Disposition: A | Discharge status: Home / Self Care | Payer: Medicare Other | Source: Ambulatory Visit | Attending: Specialist | Admitting: Specialist

## 2011-09-27 ENCOUNTER — Ambulatory Visit (HOSPITAL_COMMUNITY)
Admission: RE | Admit: 2011-09-27 | Discharge: 2011-09-27 | Disposition: A | Discharge status: Home / Self Care | Payer: Medicare Other | Source: Ambulatory Visit | Attending: Specialist | Admitting: Specialist

## 2011-09-27 DIAGNOSIS — M25561 Pain in right knee: Secondary | ICD-10-CM

## 2011-09-27 DIAGNOSIS — Z96659 Presence of unspecified artificial knee joint: Secondary | ICD-10-CM | POA: Insufficient documentation

## 2011-09-27 DIAGNOSIS — M25569 Pain in unspecified knee: Secondary | ICD-10-CM | POA: Insufficient documentation

## 2011-09-27 MED ORDER — TECHNETIUM TC 99M MEDRONATE IV KIT
24.0000 | PACK | Freq: Once | INTRAVENOUS | Status: AC | PRN
  Administered 2011-09-27: 24 via INTRAVENOUS

## 2011-10-11 ENCOUNTER — Encounter (HOSPITAL_COMMUNITY)
Admission: RE | Admit: 2011-10-11 | Discharge: 2011-10-11 | Disposition: A | Discharge status: Home / Self Care | Payer: Medicare Other | Source: Ambulatory Visit | Attending: Specialist | Admitting: Specialist

## 2011-10-11 DIAGNOSIS — C73 Malignant neoplasm of thyroid gland: Secondary | ICD-10-CM | POA: Insufficient documentation

## 2011-10-11 MED ORDER — THYROTROPIN ALFA 1.1 MG IM SOLR
0.9000 mg | INTRAMUSCULAR | Status: DC
  Filled 2011-10-11: qty 0.9

## 2011-10-12 ENCOUNTER — Encounter (HOSPITAL_COMMUNITY)
Admission: RE | Admit: 2011-10-12 | Discharge: 2011-10-12 | Disposition: A | Discharge status: Home / Self Care | Payer: Medicare Other | Source: Ambulatory Visit | Attending: Endocrinology | Admitting: Endocrinology

## 2011-10-12 MED ORDER — THYROTROPIN ALFA 1.1 MG IM SOLR: 0.9000 mg | INTRAMUSCULAR | Status: DC

## 2011-10-12 MED ORDER — THYROTROPIN ALFA 1.1 MG IM SOLR
0.9000 mg | INTRAMUSCULAR | Status: DC
  Filled 2011-10-12: qty 0.9

## 2011-10-13 ENCOUNTER — Encounter (HOSPITAL_COMMUNITY)
Admission: RE | Admit: 2011-10-13 | Discharge: 2011-10-13 | Disposition: A | Discharge status: Home / Self Care | Payer: Medicare Other | Source: Ambulatory Visit | Attending: Endocrinology | Admitting: Endocrinology

## 2011-10-15 ENCOUNTER — Encounter (HOSPITAL_COMMUNITY): Payer: Self-pay

## 2011-10-15 ENCOUNTER — Encounter (HOSPITAL_COMMUNITY)
Admission: RE | Admit: 2011-10-15 | Discharge: 2011-10-15 | Disposition: A | Discharge status: Home / Self Care | Payer: Medicare Other | Source: Ambulatory Visit | Attending: Endocrinology | Admitting: Endocrinology

## 2011-10-15 MED ORDER — SODIUM IODIDE I 131 CAPSULE
4.2000 | Freq: Once | INTRAVENOUS | Status: AC | PRN
  Administered 2011-10-15: 4.2 via ORAL

## 2011-11-12 MED FILL — Thyrotropin Alfa For Inj 1.1 MG: INTRAMUSCULAR | Qty: 0.9 | Status: AC

## 2011-11-29 ENCOUNTER — Ambulatory Visit: Payer: Medicare Other | Admitting: Infectious Disease

## 2011-11-29 ENCOUNTER — Telehealth: Payer: Self-pay | Admitting: *Deleted

## 2011-11-29 NOTE — Telephone Encounter (Signed)
NO she IS GOOD SHe can come in Feb if she likes NO rush whatsoever

## 2011-11-29 NOTE — Telephone Encounter (Signed)
Patient forgot about her 6 month follow up appointment on 11/29/11. The next available appointment was booked: 01/11/12 at 3:15.

## 2011-11-30 NOTE — Telephone Encounter (Signed)
Great.  Thanks

## 2012-01-11 ENCOUNTER — Encounter: Payer: Self-pay | Admitting: Infectious Disease

## 2012-01-11 ENCOUNTER — Ambulatory Visit (INDEPENDENT_AMBULATORY_CARE_PROVIDER_SITE_OTHER): Payer: Medicare Other | Admitting: Infectious Disease

## 2012-01-11 VITALS — BP 146/75 | HR 90 | Temp 97.8°F | Wt 252.0 lb

## 2012-01-11 DIAGNOSIS — M519 Unspecified thoracic, thoracolumbar and lumbosacral intervertebral disc disorder: Secondary | ICD-10-CM

## 2012-01-11 DIAGNOSIS — M4640 Discitis, unspecified, site unspecified: Secondary | ICD-10-CM

## 2012-01-11 DIAGNOSIS — Z96659 Presence of unspecified artificial knee joint: Secondary | ICD-10-CM

## 2012-01-11 DIAGNOSIS — M869 Osteomyelitis, unspecified: Secondary | ICD-10-CM

## 2012-01-11 LAB — CBC WITH DIFFERENTIAL/PLATELET
Basophils Relative: 0 % (ref 0–1)
Eosinophils Absolute: 0.1 10*3/uL (ref 0.0–0.7)
Eosinophils Relative: 1 % (ref 0–5)
HCT: 29 % — ABNORMAL LOW (ref 36.0–46.0)
Hemoglobin: 9.4 g/dL — ABNORMAL LOW (ref 12.0–15.0)
MCH: 28.4 pg (ref 26.0–34.0)
MCHC: 32.4 g/dL (ref 30.0–36.0)
MCV: 87.6 fL (ref 78.0–100.0)
Monocytes Absolute: 0.8 10*3/uL (ref 0.1–1.0)
Monocytes Relative: 6 % (ref 3–12)
Neutro Abs: 10.5 10*3/uL — ABNORMAL HIGH (ref 1.7–7.7)

## 2012-01-11 NOTE — Progress Notes (Signed)
  Subjective:    Patient ID: [name], female    DOB: 01/12/1940, 72 y.o.   MRN: [id number]  HPI  72 year old AA lady is s.p L4-5 decompression and fusion by Dr. [name] in February 2009 for severe spondylolisthesis, spinal stenosis with severe lower extremity pain. Pt then developed L3-4 diskitis, and epidural and paraspinal fluid collections in May 2009 when she underwent I and D by Dr. [name]. Her cultures on oral antibiotics failed to yield an organism. She was treated with aztreonama nd vancomycin, and later changed to oral suppressive rifampin and doxycycline. She had been on doxycyline for several years. Her ESR, CRP have been normal. Sheundwernt left TKA by Dr. Theda Sers. I kept her on doxycline in the interi but then stopped it. She appears to have been off antibiotics for approx 7 months. She has minimal back pain at present and largely with exertion and weight bearing and it  has not worsened. She has minimal pain at rest.    Review of Systems  Constitutional: Negative for fever, chills, diaphoresis, activity change, appetite change, fatigue and unexpected weight change.  HENT: Negative for congestion, sore throat, rhinorrhea, sneezing, trouble swallowing and sinus pressure.   Eyes: Negative for photophobia and visual disturbance.  Respiratory: Negative for cough, chest tightness, shortness of breath, wheezing and stridor.   Cardiovascular: Negative for chest pain, palpitations and leg swelling.  Gastrointestinal: Negative for nausea, vomiting, abdominal pain, diarrhea, constipation, blood in stool, abdominal distention and anal bleeding.  Genitourinary: Negative for dysuria, hematuria, flank pain and difficulty urinating.  Musculoskeletal: Negative for myalgias, back pain, joint swelling, arthralgias and gait problem.  Skin: Negative for color change, pallor, rash and wound.  Neurological: Negative for dizziness, tremors, weakness and light-headedness.  Hematological: Negative for  adenopathy. Does not bruise/bleed easily.  Psychiatric/Behavioral: Negative for behavioral problems, confusion, sleep disturbance, dysphoric mood, decreased concentration and agitation.       Objective:   Physical Exam  Constitutional: She is oriented to person, place, and time. She appears well-developed and well-nourished. No distress.  HENT:  Head: Normocephalic and atraumatic.  Mouth/Throat: Oropharynx is clear and moist. No oropharyngeal exudate.  Eyes: Conjunctivae normal and EOM are normal. Pupils are equal, round, and reactive to light. No scleral icterus.  Neck: Normal range of motion. Neck supple. No JVD present.  Cardiovascular: Normal rate, regular rhythm and normal heart sounds.  Exam reveals no gallop and no friction rub.   No murmur heard. Pulmonary/Chest: Effort normal and breath sounds normal. No respiratory distress. She has no wheezes. She has no rales. She exhibits no tenderness.  Abdominal: She exhibits no distension and no mass. There is no tenderness. There is no rebound and no guarding.  Musculoskeletal: She exhibits no edema and no tenderness.       Back:       Legs: Lymphadenopathy:    She has no cervical adenopathy.  Neurological: She is alert and oriented to person, place, and time. She has normal reflexes. She exhibits normal muscle tone. Coordination normal.  Skin: Skin is warm and dry. She is not diaphoretic. No erythema. No pallor.  Psychiatric: She has a normal mood and affect. Her behavior is normal. Judgment and thought content normal.          Assessment & Plan:  Diskitis: doing well OFF antibiotics, rtc in one year after check esr and crp today

## 2012-01-14 ENCOUNTER — Other Ambulatory Visit: Payer: Self-pay | Admitting: Family Medicine

## 2012-01-14 DIAGNOSIS — Z1231 Encounter for screening mammogram for malignant neoplasm of breast: Secondary | ICD-10-CM

## 2012-01-14 DIAGNOSIS — Z780 Asymptomatic menopausal state: Secondary | ICD-10-CM

## 2012-01-14 DIAGNOSIS — M8580 Other specified disorders of bone density and structure, unspecified site: Secondary | ICD-10-CM

## 2012-02-10 ENCOUNTER — Ambulatory Visit: Payer: Medicare Other

## 2012-02-10 ENCOUNTER — Other Ambulatory Visit: Payer: Medicare Other

## 2012-03-02 DIAGNOSIS — E209 Hypoparathyroidism, unspecified: Secondary | ICD-10-CM | POA: Insufficient documentation

## 2012-03-08 ENCOUNTER — Ambulatory Visit
Admission: RE | Admit: 2012-03-08 | Discharge: 2012-03-08 | Disposition: A | Discharge status: Home / Self Care | Payer: Medicare Other | Source: Ambulatory Visit | Attending: Family Medicine | Admitting: Family Medicine

## 2012-03-08 DIAGNOSIS — Z1231 Encounter for screening mammogram for malignant neoplasm of breast: Secondary | ICD-10-CM

## 2012-03-08 DIAGNOSIS — M8580 Other specified disorders of bone density and structure, unspecified site: Secondary | ICD-10-CM

## 2012-03-08 DIAGNOSIS — Z780 Asymptomatic menopausal state: Secondary | ICD-10-CM

## 2012-03-29 ENCOUNTER — Emergency Department (INDEPENDENT_AMBULATORY_CARE_PROVIDER_SITE_OTHER): Payer: Medicare Other

## 2012-03-29 ENCOUNTER — Emergency Department (INDEPENDENT_AMBULATORY_CARE_PROVIDER_SITE_OTHER)
Admission: EM | Admit: 2012-03-29 | Discharge: 2012-03-29 | Disposition: A | Discharge status: Home / Self Care | Payer: Medicare Other | Source: Home / Self Care

## 2012-03-29 ENCOUNTER — Encounter (HOSPITAL_COMMUNITY): Payer: Self-pay | Admitting: Emergency Medicine

## 2012-03-29 DIAGNOSIS — M25539 Pain in unspecified wrist: Secondary | ICD-10-CM

## 2012-03-29 DIAGNOSIS — M25531 Pain in right wrist: Secondary | ICD-10-CM

## 2012-03-29 DIAGNOSIS — M109 Gout, unspecified: Secondary | ICD-10-CM

## 2012-03-29 MED ORDER — COLCHICINE 0.6 MG PO TABS: ORAL_TABLET | ORAL | Status: DC

## 2012-03-29 MED ORDER — HYDROCODONE-ACETAMINOPHEN 5-325 MG PO TABS: 1.0000 | ORAL_TABLET | ORAL | Status: DC | PRN

## 2012-03-29 MED ORDER — TRIAMCINOLONE ACETONIDE 40 MG/ML IJ SUSP
40.0000 mg | Freq: Once | INTRAMUSCULAR | Status: AC
  Administered 2012-03-29: 40 mg via INTRAMUSCULAR

## 2012-03-29 MED ORDER — TRIAMCINOLONE ACETONIDE 40 MG/ML IJ SUSP
INTRAMUSCULAR | Status: AC
  Filled 2012-03-29: qty 5

## 2012-03-29 NOTE — ED Provider Notes (Signed)
History     CSN: [id number]  Arrival date & time 03/29/12  1442   First MD Initiated Contact with Patient 03/29/12 1545      Chief Complaint  Patient presents with  . Hand Pain    (Consider location/radiation/quality/duration/timing/severity/associated sxs/prior treatment) HPI Comments: 72 year old female presents with severe pain in the right wrist and hand for 2 and half days. She states there is swelling that comes and goes in the wrist and hand. St is severe. No known injury.    Past Medical History  Diagnosis Date  . Diabetes mellitus   . Hyperlipidemia   . Hypertension   . History of transient ischemic attack (TIA)   . Thyroid carcinoma   . GERD (gastroesophageal reflux disease)   . OA (osteoarthritis)   . Asthma   . Anemia   . Chest pain, atypical     Past Surgical History  Procedure Laterality Date  . Cardiac catheterization  03/18/2009    NORMAL LEFT VENTRICULAR SIZE AND CONTRACTILITY WITH NORMAL SYSTOLIC  FUNCTION. EF 60%  . Cardiolite study      SHOWED A SIGNIFICANT REVERSIBLE ANTERIOR WALL DEFECT CONSISTENT WITH ISCHEMIA. EF 55%  . Thyroidectomy    . Back surgery    . Lumbar fusion    . Total knee arthroplasty      right    Family History  Problem Relation Age of Onset  . Pneumonia Mother   . Heart attack Father     History  Substance Use Topics  . Smoking status: Former Research scientist (life sciences)  . Smokeless tobacco: Never Used  . Alcohol Use: No    OB History   Grav Para Term Preterm Abortions TAB SAB Ect Mult Living                  Review of Systems  Constitutional: Negative for fever, chills and activity change.  Respiratory: Negative.   Cardiovascular: Negative.   Musculoskeletal:       As per HPI  Skin: Negative for color change, pallor and rash.  Neurological: Negative.     Allergies  Actos; Cephalexin; Norvasc; and Rosiglitazone maleate  Home Medications   Current Outpatient Rx  Name  Route  Sig  Dispense  Refill  . alendronate  (FOSAMAX) 70 MG tablet   Oral   Take 70 mg by mouth every 7 (seven) days. Take with a full glass of water on an empty stomach.          Marland Kitchen amLODipine (NORVASC) 2.5 MG tablet   Oral   Take 2.5 mg by mouth daily.           . Calcium Carbonate Antacid (MAALOX) 600 MG chewable tablet                . CALCIUM PO   Oral   Take 1 tablet by mouth daily.           Marland Kitchen dipyridamole-aspirin (AGGRENOX) 25-200 MG per 12 hr capsule   Oral   Take 1 capsule by mouth 2 (two) times daily.          . hydrochlorothiazide 25 MG tablet   Oral   Take 25 mg by mouth daily.           Marland Kitchen levothyroxine (SYNTHROID, LEVOTHROID) 150 MCG tablet   Oral   Take 150 mcg by mouth daily.          Marland Kitchen lisinopril (PRINIVIL,ZESTRIL) 40 MG tablet   Oral   Take 40 mg by  mouth 2 (two) times daily.           . metFORMIN (GLUCOPHAGE) 850 MG tablet   Oral   Take 850 mg by mouth 2 (two) times daily with a meal.         . Acetaminophen-Codeine 300-30 MG per tablet   Oral   Take 1 tablet by mouth every 6 (six) hours.           . colchicine 0.6 MG tablet      1 po q d for 5 days.  Take with food   5 tablet   0   . doxycycline (DORYX) 100 MG EC tablet   Oral   Take 1 tablet (100 mg total) by mouth 2 (two) times daily.   60 tablet   11   . HYDROcodone-acetaminophen (NORCO/VICODIN) 5-325 MG per tablet   Oral   Take 1 tablet by mouth every 4 (four) hours as needed for pain.   15 tablet   0   . ondansetron (ZOFRAN) 4 MG tablet      ( UNKNOWN IF TAKING )         . pantoprazole (PROTONIX) 20 MG tablet   Oral   Take 20 mg by mouth 2 (two) times daily.           . rosuvastatin (CRESTOR) 5 MG tablet   Oral   Take 10 mg by mouth daily.            BP 137/59  Pulse 87  Temp(Src) 97.7 F (36.5 C) (Oral)  Resp 20  SpO2 100%  Physical Exam  Nursing note and vitals reviewed. Constitutional: She is oriented to person, place, and time. She appears well-developed and well-nourished. No  distress.  HENT:  Head: Normocephalic and atraumatic.  Eyes: EOM are normal. Pupils are equal, round, and reactive to light.  Neck: Normal range of motion. Neck supple.  Pulmonary/Chest: Effort normal.  Musculoskeletal: She exhibits edema and tenderness.  Tenderness primarily to the wrist with mild swelling. Minor tenderness to the palm. Unable to flex digits due to pain but no joint tenderness or swelling. Mild tenderness to the thumb MCP jt. No erythema. Distal neurovascular and sensory is intact.  Lymphadenopathy:    She has no cervical adenopathy.  Neurological: She is alert and oriented to person, place, and time. No cranial nerve deficit.  Skin: Skin is warm and dry.  No signs of infection.    ED Course  Procedures (including critical care time)  Labs Reviewed - No data to display Dg Wrist Complete Right  03/29/2012  *RADIOLOGY REPORT*  Clinical Data: Right wrist pain.  No injury.  RIGHT WRIST - COMPLETE 3+ VIEW  Comparison: None.  Findings: There are mild degenerative changes involving the wrist and proximal hand.  No acute bony findings or erosive changes. Scattered carpal cystic changes.  IMPRESSION: Degenerative changes but no acute bony findings.   Original Report Authenticated By: Marijo Sanes, M.D.      1. Wrist pain, acute, right   2. Gout attack       MDM  Stop taking a cholesterol medicine for 5 days. Call Chris 0.6 mg one daily for 5 days take with food Norco 5 mg one every 4-6 hours when necessary pain #15 Apply a wrist immobilizer to the right wrist Kenalog 40 mg IM now. Discussed with the patient that this may raise the blood sugar. Eulas Post Dr. for followup appointment tomorrow.  Janne Napoleon, NP 03/29/12 1827

## 2012-03-29 NOTE — ED Notes (Signed)
Pt is here for poss arthritis pain on right hand x3 days Sx include: swelling, constant pain that increases w/activity Denies: inj/trauma, f/v/n/d Pain is 10/10 on scale Hx of arthritis  She is alert and oriented w/no signs of acute distress.

## 2012-03-29 NOTE — ED Provider Notes (Signed)
Medical screening examination/treatment/procedure(s) were performed by resident physician or non-physician practitioner and as supervising physician I was immediately available for consultation/collaboration.   Pauline Good MD.   Billy Fischer, MD 03/29/12 2052

## 2012-04-04 ENCOUNTER — Other Ambulatory Visit: Payer: Self-pay | Admitting: Endocrinology

## 2012-04-04 DIAGNOSIS — C73 Malignant neoplasm of thyroid gland: Secondary | ICD-10-CM

## 2012-04-06 ENCOUNTER — Other Ambulatory Visit: Payer: Medicare Other

## 2012-04-07 ENCOUNTER — Ambulatory Visit
Admission: RE | Admit: 2012-04-07 | Discharge: 2012-04-07 | Disposition: A | Discharge status: Home / Self Care | Payer: Medicare Other | Source: Ambulatory Visit | Attending: Endocrinology | Admitting: Endocrinology

## 2012-04-07 DIAGNOSIS — C73 Malignant neoplasm of thyroid gland: Secondary | ICD-10-CM

## 2012-06-27 ENCOUNTER — Encounter (HOSPITAL_COMMUNITY): Payer: Self-pay

## 2012-06-27 ENCOUNTER — Emergency Department (HOSPITAL_COMMUNITY)
Admission: EM | Admit: 2012-06-27 | Discharge: 2012-06-27 | Disposition: A | Discharge status: Home / Self Care | Payer: Medicare Other | Attending: Emergency Medicine | Admitting: Emergency Medicine

## 2012-06-27 ENCOUNTER — Emergency Department (HOSPITAL_COMMUNITY): Payer: Medicare Other

## 2012-06-27 DIAGNOSIS — E785 Hyperlipidemia, unspecified: Secondary | ICD-10-CM | POA: Insufficient documentation

## 2012-06-27 DIAGNOSIS — W19XXXA Unspecified fall, initial encounter: Secondary | ICD-10-CM

## 2012-06-27 DIAGNOSIS — Z85850 Personal history of malignant neoplasm of thyroid: Secondary | ICD-10-CM | POA: Insufficient documentation

## 2012-06-27 DIAGNOSIS — W010XXA Fall on same level from slipping, tripping and stumbling without subsequent striking against object, initial encounter: Secondary | ICD-10-CM | POA: Insufficient documentation

## 2012-06-27 DIAGNOSIS — S79929A Unspecified injury of unspecified thigh, initial encounter: Secondary | ICD-10-CM | POA: Insufficient documentation

## 2012-06-27 DIAGNOSIS — I10 Essential (primary) hypertension: Secondary | ICD-10-CM | POA: Insufficient documentation

## 2012-06-27 DIAGNOSIS — Z862 Personal history of diseases of the blood and blood-forming organs and certain disorders involving the immune mechanism: Secondary | ICD-10-CM | POA: Insufficient documentation

## 2012-06-27 DIAGNOSIS — Z8719 Personal history of other diseases of the digestive system: Secondary | ICD-10-CM | POA: Insufficient documentation

## 2012-06-27 DIAGNOSIS — Z79899 Other long term (current) drug therapy: Secondary | ICD-10-CM | POA: Insufficient documentation

## 2012-06-27 DIAGNOSIS — IMO0002 Reserved for concepts with insufficient information to code with codable children: Secondary | ICD-10-CM | POA: Insufficient documentation

## 2012-06-27 DIAGNOSIS — Y9229 Other specified public building as the place of occurrence of the external cause: Secondary | ICD-10-CM | POA: Insufficient documentation

## 2012-06-27 DIAGNOSIS — M549 Dorsalgia, unspecified: Secondary | ICD-10-CM

## 2012-06-27 DIAGNOSIS — Z87891 Personal history of nicotine dependence: Secondary | ICD-10-CM | POA: Insufficient documentation

## 2012-06-27 DIAGNOSIS — S79919A Unspecified injury of unspecified hip, initial encounter: Secondary | ICD-10-CM | POA: Insufficient documentation

## 2012-06-27 DIAGNOSIS — Z8673 Personal history of transient ischemic attack (TIA), and cerebral infarction without residual deficits: Secondary | ICD-10-CM | POA: Insufficient documentation

## 2012-06-27 DIAGNOSIS — Z8739 Personal history of other diseases of the musculoskeletal system and connective tissue: Secondary | ICD-10-CM | POA: Insufficient documentation

## 2012-06-27 DIAGNOSIS — E119 Type 2 diabetes mellitus without complications: Secondary | ICD-10-CM | POA: Insufficient documentation

## 2012-06-27 DIAGNOSIS — J45909 Unspecified asthma, uncomplicated: Secondary | ICD-10-CM | POA: Insufficient documentation

## 2012-06-27 DIAGNOSIS — Y9301 Activity, walking, marching and hiking: Secondary | ICD-10-CM | POA: Insufficient documentation

## 2012-06-27 LAB — URINALYSIS, ROUTINE W REFLEX MICROSCOPIC
Glucose, UA: NEGATIVE mg/dL
Hgb urine dipstick: NEGATIVE
Ketones, ur: NEGATIVE mg/dL
Protein, ur: NEGATIVE mg/dL

## 2012-06-27 MED ORDER — OXYCODONE-ACETAMINOPHEN 5-325 MG PO TABS: 1.0000 | ORAL_TABLET | ORAL | Status: DC | PRN

## 2012-06-27 MED ORDER — OXYCODONE-ACETAMINOPHEN 5-325 MG PO TABS
2.0000 | ORAL_TABLET | Freq: Once | ORAL | Status: AC
  Administered 2012-06-27: 2 via ORAL
  Filled 2012-06-27: qty 2

## 2012-06-27 MED ORDER — NAPROXEN 500 MG PO TABS: 500.0000 mg | ORAL_TABLET | Freq: Two times a day (BID) | ORAL | Status: DC

## 2012-06-27 NOTE — ED Provider Notes (Signed)
History    CSN: [id number] Arrival date & time 06/27/12  1311  First MD Initiated Contact with Patient 06/27/12 1405     Chief Complaint  Patient presents with  . Fall   (Consider location/radiation/quality/duration/timing/severity/associated sxs/prior Treatment) HPI Comments: 72 year old female who presents after having a trip and fall at the restaurant, falling onto her left hip and lower back. She states that she lost her balance and fell over the curb where the car parts. She landed on her back, did not hit her head, did not hit her ankles. She was unable to get up by herself but was able to get up with help fromBilateral conjunctiva are clear, bilateral pupils are reactive and equal, extraocular movements are intact bilaterally, no scleral icterus, and her friends. She states that the pain is in her lower back, radiates down her leg, is not associated with deformity, swelling, numbness.  Patient is a 72 y.o. female presenting with fall. The history is provided by the patient.  Fall   Past Medical History  Diagnosis Date  . Diabetes mellitus   . Hyperlipidemia   . Hypertension   . History of transient ischemic attack (TIA)   . Thyroid carcinoma   . GERD (gastroesophageal reflux disease)   . OA (osteoarthritis)   . Asthma   . Anemia   . Chest pain, atypical    Past Surgical History  Procedure Laterality Date  . Cardiac catheterization  03/18/2009    NORMAL LEFT VENTRICULAR SIZE AND CONTRACTILITY WITH NORMAL SYSTOLIC  FUNCTION. EF 60%  . Cardiolite study      SHOWED A SIGNIFICANT REVERSIBLE ANTERIOR WALL DEFECT CONSISTENT WITH ISCHEMIA. EF 55%  . Thyroidectomy    . Back surgery    . Lumbar fusion    . Total knee arthroplasty      right   Family History  Problem Relation Age of Onset  . Pneumonia Mother   . Heart attack Father    History  Substance Use Topics  . Smoking status: Former Research scientist (life sciences)  . Smokeless tobacco: Never Used  . Alcohol Use: No   OB History   Grav Para Term Preterm Abortions TAB SAB Ect Mult Living                 Review of Systems  All other systems reviewed and are negative.    Allergies  Actos; Cephalexin; Norvasc; and Rosiglitazone maleate  Home Medications   Current Outpatient Rx  Name  Route  Sig  Dispense  Refill  . alendronate (FOSAMAX) 70 MG tablet   Oral   Take 70 mg by mouth every 7 (seven) days. Monday. Take with a full glass of water on an empty stomach.         Marland Kitchen amLODipine (NORVASC) 10 MG tablet   Oral   Take 10 mg by mouth daily.         Marland Kitchen atorvastatin (LIPITOR) 20 MG tablet   Oral   Take 20 mg by mouth daily.         . calcitRIOL (ROCALTROL) 0.25 MCG capsule   Oral   Take 0.5 mcg by mouth daily.         . calcium carbonate (OS-CAL) 600 MG TABS   Oral   Take 600 mg by mouth 2 (two) times daily.         . calcium-vitamin D (OSCAL WITH D) 500-200 MG-UNIT per tablet   Oral   Take 1 tablet by mouth daily with breakfast.         .  dipyridamole-aspirin (AGGRENOX) 25-200 MG per 12 hr capsule   Oral   Take 1 capsule by mouth 2 (two) times daily.          . hydrochlorothiazide 25 MG tablet   Oral   Take 25 mg by mouth daily.           Marland Kitchen levothyroxine (SYNTHROID, LEVOTHROID) 137 MCG tablet   Oral   Take 137 mcg by mouth daily before breakfast.         . lisinopril (PRINIVIL,ZESTRIL) 40 MG tablet   Oral   Take 40 mg by mouth 2 (two) times daily.           . metFORMIN (GLUCOPHAGE) 1000 MG tablet   Oral   Take 1,000 mg by mouth 2 (two) times daily.         . pregabalin (LYRICA) 75 MG capsule   Oral   Take 75 mg by mouth 2 (two) times daily.         . naproxen (NAPROSYN) 500 MG tablet   Oral   Take 1 tablet (500 mg total) by mouth 2 (two) times daily with a meal.   30 tablet   0   . oxyCODONE-acetaminophen (PERCOCET) 5-325 MG per tablet   Oral   Take 1 tablet by mouth every 4 (four) hours as needed for pain.   20 tablet   0    BP 121/60  Pulse 84   Temp(Src) 98.3 F (36.8 C) (Oral)  Resp 21  SpO2 100% Physical Exam  Nursing note and vitals reviewed. Constitutional: She appears well-developed and well-nourished. No distress.  HENT:  Head: Normocephalic and atraumatic.  Mouth/Throat: Oropharynx is clear and moist. No oropharyngeal exudate.  Eyes: Conjunctivae and EOM are normal. Pupils are equal, round, and reactive to light. Right eye exhibits no discharge. Left eye exhibits no discharge. No scleral icterus.  Neck: Normal range of motion. Neck supple. No JVD present. No thyromegaly present.  Cardiovascular: Normal rate, regular rhythm, normal heart sounds and intact distal pulses.  Exam reveals no gallop and no friction rub.   No murmur heard. Pulmonary/Chest: Effort normal and breath sounds normal. No respiratory distress. She has no wheezes. She has no rales.  Abdominal: Soft. Bowel sounds are normal. She exhibits no distension and no mass. There is no tenderness.  Musculoskeletal: Normal range of motion. She exhibits tenderness ( Mild tenderness with range of motion of the left hip, tenderness over the left buttock and left lower back, no midline tenderness, no bony tenderness of the pelvis. Mild pain with range of motion of the left knee but no signs of injury ). She exhibits no edema.  Lymphadenopathy:    She has no cervical adenopathy.  Neurological: She is alert. Coordination normal.  Able to straight leg raise bilaterally, normal sensation to light touch and pinprick, normal strength in all major extremities.  Skin: Skin is warm and dry. No rash noted. No erythema.  Psychiatric: She has a normal mood and affect. Her behavior is normal.    ED Course  Procedures (including critical care time) Labs Reviewed  URINALYSIS, ROUTINE W REFLEX MICROSCOPIC - Abnormal; Notable for the following:    APPearance CLOUDY (*)    Leukocytes, UA MODERATE (*)    All other components within normal limits  URINE MICROSCOPIC-ADD ON - Abnormal;  Notable for the following:    Squamous Epithelial / LPF FEW (*)    All other components within normal limits   Dg Lumbar Spine Complete  06/27/2012   *RADIOLOGY REPORT*  Clinical Data: Fall.  Low back pain.  LUMBAR SPINE - COMPLETE 4+ VIEW  Comparison: Lumbar spine radiographs 09/26/2009.  Findings: The patient is status post fusion at L3-4 with posterior pedicle screw rod fixation.  There is partial sacralization of the L5 segment.  Advancing endplate sclerotic changes are present at L2- 3. There is further loss of disc height at that level.  Advancing facet degenerative changes are also present at L2-3.  The fused segments are intact.  Alignment is anatomic.  IMPRESSION:  1.  Stable fusion at L3-4. 2.  Transitional anatomy with a similar numbering system used as on the prior exam. 3.  Progressive adjacent level disease at L2-3 with further loss of disc height, increased facet hypertrophy, and a vacuum disc.   Original Report Authenticated By: San Morelle, M.D.   Dg Hip Complete Left  06/27/2012   *RADIOLOGY REPORT*  Clinical Data: [place] and injured left hip.  Current history of thyroid cancer.  LEFT HIP - COMPLETE 2+ VIEW  Comparison: Left hip x-rays 04/09/2004.  Findings: No evidence of acute fracture or dislocation.  Well- preserved joint space for age.  Bone mineral density well preserved.  Included AP pelvis demonstrates a normal-appearing contralateral right hip joint.  Degenerative changes involving the symphysis pubis, unchanged.  Sacroiliac joints intact.  Enthesopathic spurring at multiple musculotendinous insertions sites.  Interval L3-4 fusion.  IMPRESSION: No acute or significant abnormality involving the left hip.   Original Report Authenticated By: Evangeline Dakin, M.D.   1. Fall, initial encounter   2. Back pain     MDM  The patient had fallen to her left buttock and lower back, she has no signs of acute fractures on her imaging, she has been given pain medication and states  that she is doing much better. At this time the patient appears stable for discharge, she has normal vital signs, she has a walker at home which she uses because of a prior stroke and has family members and friends that can come back for a period she has agreed to the plan, pain medications prescribed as an outpatient   Meds given in ED:  Medications  oxyCODONE-acetaminophen (PERCOCET/ROXICET) 5-325 MG per tablet 2 tablet (2 tablets Oral Given 06/27/12 1433)    New Prescriptions   NAPROXEN (NAPROSYN) 500 MG TABLET    Take 1 tablet (500 mg total) by mouth 2 (two) times daily with a meal.   OXYCODONE-ACETAMINOPHEN (PERCOCET) 5-325 MG PER TABLET    Take 1 tablet by mouth every 4 (four) hours as needed for pain.      Johnna Acosta, MD 06/27/12 1556

## 2012-06-27 NOTE — ED Notes (Signed)
Pt states at [place] and tripped over the curb landing on her lt side; pt c/o pain to lt side, pt is ambulatory to rest room, pt denies LOC

## 2012-07-01 ENCOUNTER — Emergency Department (HOSPITAL_COMMUNITY)
Admission: EM | Admit: 2012-07-01 | Discharge: 2012-07-01 | Disposition: A | Discharge status: Home / Self Care | Payer: Medicare Other | Attending: Emergency Medicine | Admitting: Emergency Medicine

## 2012-07-01 ENCOUNTER — Emergency Department (HOSPITAL_COMMUNITY): Payer: Medicare Other

## 2012-07-01 ENCOUNTER — Encounter (HOSPITAL_COMMUNITY): Payer: Self-pay | Admitting: Emergency Medicine

## 2012-07-01 DIAGNOSIS — Z79899 Other long term (current) drug therapy: Secondary | ICD-10-CM | POA: Insufficient documentation

## 2012-07-01 DIAGNOSIS — Z8719 Personal history of other diseases of the digestive system: Secondary | ICD-10-CM | POA: Insufficient documentation

## 2012-07-01 DIAGNOSIS — IMO0002 Reserved for concepts with insufficient information to code with codable children: Secondary | ICD-10-CM | POA: Insufficient documentation

## 2012-07-01 DIAGNOSIS — J45909 Unspecified asthma, uncomplicated: Secondary | ICD-10-CM | POA: Insufficient documentation

## 2012-07-01 DIAGNOSIS — Z8739 Personal history of other diseases of the musculoskeletal system and connective tissue: Secondary | ICD-10-CM | POA: Insufficient documentation

## 2012-07-01 DIAGNOSIS — Z8673 Personal history of transient ischemic attack (TIA), and cerebral infarction without residual deficits: Secondary | ICD-10-CM | POA: Insufficient documentation

## 2012-07-01 DIAGNOSIS — Y9289 Other specified places as the place of occurrence of the external cause: Secondary | ICD-10-CM | POA: Insufficient documentation

## 2012-07-01 DIAGNOSIS — Z862 Personal history of diseases of the blood and blood-forming organs and certain disorders involving the immune mechanism: Secondary | ICD-10-CM | POA: Insufficient documentation

## 2012-07-01 DIAGNOSIS — E785 Hyperlipidemia, unspecified: Secondary | ICD-10-CM | POA: Insufficient documentation

## 2012-07-01 DIAGNOSIS — Y9389 Activity, other specified: Secondary | ICD-10-CM | POA: Insufficient documentation

## 2012-07-01 DIAGNOSIS — R296 Repeated falls: Secondary | ICD-10-CM | POA: Insufficient documentation

## 2012-07-01 DIAGNOSIS — Z85850 Personal history of malignant neoplasm of thyroid: Secondary | ICD-10-CM | POA: Insufficient documentation

## 2012-07-01 DIAGNOSIS — E119 Type 2 diabetes mellitus without complications: Secondary | ICD-10-CM | POA: Insufficient documentation

## 2012-07-01 DIAGNOSIS — I10 Essential (primary) hypertension: Secondary | ICD-10-CM | POA: Insufficient documentation

## 2012-07-01 DIAGNOSIS — M549 Dorsalgia, unspecified: Secondary | ICD-10-CM

## 2012-07-01 LAB — URINALYSIS, ROUTINE W REFLEX MICROSCOPIC
Bilirubin Urine: NEGATIVE
Glucose, UA: NEGATIVE mg/dL
Hgb urine dipstick: NEGATIVE
Ketones, ur: NEGATIVE mg/dL
Leukocytes, UA: NEGATIVE
Nitrite: NEGATIVE
Protein, ur: NEGATIVE mg/dL
Specific Gravity, Urine: 1.022 (ref 1.005–1.030)
Urobilinogen, UA: 0.2 mg/dL (ref 0.0–1.0)
pH: 5 (ref 5.0–8.0)

## 2012-07-01 MED ORDER — HYDROCODONE-ACETAMINOPHEN 5-325 MG PO TABS
1.0000 | ORAL_TABLET | Freq: Once | ORAL | Status: AC
  Administered 2012-07-01: 1 via ORAL
  Filled 2012-07-01: qty 1

## 2012-07-01 NOTE — ED Notes (Signed)
Patient transported to CT 

## 2012-07-01 NOTE — ED Notes (Signed)
Pt states that she fell on Tuesday out in her flower bed. Pt c/o left side pain that radiates to her lower left back area and down through her buttock.  Pt states that she has been taking the percocet that she was given but she is tired of taking them bc she goes to sleep and cant hear what is going on in her house. last time she took one was last night and wants to be seen here today to "get down to the root of the pain problem".  Pt also c/o pain in left arm where she has a laceration on it from her fall on Tuesday as well. Pt also c/o left knee pain.

## 2012-07-01 NOTE — ED Provider Notes (Signed)
History    CSN: [id number] Arrival date & time 07/01/12  1738  First MD Initiated Contact with Patient 07/01/12 1922     Chief Complaint  Patient presents with  . left side pain   . Back Pain   (Consider location/radiation/quality/duration/timing/severity/associated sxs/prior Treatment) HPI Comments: Pt fell outside at restaurant into flower bed that had bricks on it, landed on left buttock, 4 days ago.  She was evaluated in the ED then and had negative plain films of hip and back and also had evluation of Urine.  Pt even saw PMD on Thursday, was doing slightly better then, felt like percocet was just making her too sleepy, she would wake, and pain is still there.  She comes in today having not had any of her pain meds and reports pain seems worse, she is quite concerned that something is still "wrong." and feels like her rib fractures that she had in the past.  No weakness of legs, is able to ambulate and bear weight.    Patient is a 72 y.o. female presenting with back pain. The history is provided by the patient and medical records.  Back Pain Location:  Lumbar spine, sacro-iliac joint and gluteal region Quality:  Stabbing and shooting Radiates to:  L thigh Pain severity:  Severe Pain is:  Same all the time Onset quality:  Sudden Duration:  4 days Progression:  Worsening Chronicity:  New Context: falling   Relieved by:  Narcotics and being still Worsened by:  Standing, ambulation and movement Ineffective treatments:  Bed rest, being still, heating pad and OTC medications Associated symptoms: no bladder incontinence, no bowel incontinence, no dysuria, no fever, no headaches, no leg pain, no numbness, no paresthesias and no weakness    Past Medical History  Diagnosis Date  . Diabetes mellitus   . Hyperlipidemia   . Hypertension   . History of transient ischemic attack (TIA)   . Thyroid carcinoma   . GERD (gastroesophageal reflux disease)   . OA (osteoarthritis)   . Asthma    . Anemia   . Chest pain, atypical    Past Surgical History  Procedure Laterality Date  . Cardiac catheterization  03/18/2009    NORMAL LEFT VENTRICULAR SIZE AND CONTRACTILITY WITH NORMAL SYSTOLIC  FUNCTION. EF 60%  . Cardiolite study      SHOWED A SIGNIFICANT REVERSIBLE ANTERIOR WALL DEFECT CONSISTENT WITH ISCHEMIA. EF 55%  . Thyroidectomy    . Back surgery    . Lumbar fusion    . Total knee arthroplasty      right   Family History  Problem Relation Age of Onset  . Pneumonia Mother   . Heart attack Father    History  Substance Use Topics  . Smoking status: Former Research scientist (life sciences)  . Smokeless tobacco: Never Used  . Alcohol Use: No   OB History   Grav Para Term Preterm Abortions TAB SAB Ect Mult Living                 Review of Systems  Constitutional: Negative for fever and chills.  Gastrointestinal: Negative for bowel incontinence.  Genitourinary: Negative for bladder incontinence, dysuria and difficulty urinating.  Musculoskeletal: Positive for back pain and arthralgias.  Skin: Negative for rash and wound.  Neurological: Negative for weakness, numbness, headaches and paresthesias.  All other systems reviewed and are negative.    Allergies  Actos; Cephalexin; Norvasc; and Rosiglitazone maleate  Home Medications   Current Outpatient Rx  Name  Route  Sig  Dispense  Refill  . alendronate (FOSAMAX) 70 MG tablet   Oral   Take 70 mg by mouth every 7 (seven) days. Monday. Take with a full glass of water on an empty stomach.         Marland Kitchen amLODipine (NORVASC) 10 MG tablet   Oral   Take 10 mg by mouth daily.         Marland Kitchen atorvastatin (LIPITOR) 20 MG tablet   Oral   Take 20 mg by mouth daily.         . calcitRIOL (ROCALTROL) 0.25 MCG capsule   Oral   Take 0.5 mcg by mouth daily.         . calcium carbonate (OS-CAL) 600 MG TABS   Oral   Take 600 mg by mouth 2 (two) times daily.         . calcium-vitamin D (OSCAL WITH D) 500-200 MG-UNIT per tablet   Oral   Take 1  tablet by mouth daily with breakfast.         . dipyridamole-aspirin (AGGRENOX) 25-200 MG per 12 hr capsule   Oral   Take 1 capsule by mouth 2 (two) times daily.          . hydrochlorothiazide 25 MG tablet   Oral   Take 25 mg by mouth daily.           Marland Kitchen levothyroxine (SYNTHROID, LEVOTHROID) 137 MCG tablet   Oral   Take 137 mcg by mouth daily before breakfast.         . lisinopril (PRINIVIL,ZESTRIL) 40 MG tablet   Oral   Take 40 mg by mouth 2 (two) times daily.           . metFORMIN (GLUCOPHAGE) 1000 MG tablet   Oral   Take 1,000 mg by mouth 2 (two) times daily.         . naproxen (NAPROSYN) 500 MG tablet   Oral   Take 1 tablet (500 mg total) by mouth 2 (two) times daily with a meal.   30 tablet   0   . oxyCODONE-acetaminophen (PERCOCET) 5-325 MG per tablet   Oral   Take 1 tablet by mouth every 4 (four) hours as needed for pain.   20 tablet   0   . pregabalin (LYRICA) 75 MG capsule   Oral   Take 75 mg by mouth 2 (two) times daily.          BP 124/55  Pulse 71  Temp(Src) 99.1 F (37.3 C) (Oral)  Resp 16  SpO2 97% Physical Exam  Nursing note and vitals reviewed. Constitutional: She appears well-developed and well-nourished.  HENT:  Head: Normocephalic and atraumatic.  Eyes: EOM are normal. No scleral icterus.  Neck: Normal range of motion. Neck supple.  Cardiovascular: Normal rate, regular rhythm and intact distal pulses.   Pulmonary/Chest: Effort normal. No respiratory distress. She has no wheezes.  Abdominal: Soft. She exhibits no distension. There is no tenderness.  Neurological: She is alert. She exhibits normal muscle tone. Coordination normal.  Skin: Skin is warm. No rash noted.    ED Course  Procedures (including critical care time) Labs Reviewed  URINALYSIS, ROUTINE W REFLEX MICROSCOPIC   Ct Lumbar Spine Wo Contrast  07/01/2012   *RADIOLOGY REPORT*  Clinical Data:  LOW BACK PAIN RADIATING INTO BILATERAL HIPS AFTER FALL.  BACK SURGERY  4 YEARS AGO.  CT LUMBAR SPINE WITHOUT CONTRAST  Technique:  Multidetector CT imaging of the lumbar spine  was performed without intravenous contrast administration.  Multiplanar CT image reconstructions were also generated.  Comparison:  06/27/2012 plain films.  Lumbar spine CT 09/16/2009.  Findings:  Soft tissue windows demonstrate no significant retroperitoneal findings.  Bone windows demonstrate a transitional anatomy, with old last open disc being labeled L5-S1, as the 09/16/2009 exam.  Presuming this nomenclature, the patient is status post L3-L4 trans pedicle screw fixation.  A disc bulge at the L2-L3 level is grossly similar to 09/16/2009, and causes bilateral neural foraminal and central canal narrowing.  No acute hardware complication identified.  Remainder vertebral body height maintained.  Degenerative disc disease at T10- T11 is advanced.  Mild osteopenia.  No acute hardware complication.  IMPRESSION: Status post L3-L4 trans pedicle screw fixation, without acute hardware complication.  Maintenance of vertebral body height.  Grossly similar spondylosis, suboptimally evaluated by CT.  CT PELVIS WITHOUT CONTRAST  Technique:  Multidetector CT imaging of the pelvis was performed following the standard protocol without intravenous contrast.  Findings:  Soft tissue windows demonstrate pelvic floor laxity. Surgical changes in the anterior pelvic wall.  Hysterectomy.  No soft tissue hematoma.  Bone windows demonstrate degenerative sclerosis about the symphysis pubis.  Both femoral heads are located. No acute fracture.  IMPRESSION: No acute or post-traumatic deformity identified.   Original Report Authenticated By: Abigail Miyamoto, M.D.   Ct Pelvis Wo Contrast  07/01/2012   *RADIOLOGY REPORT*  Clinical Data:  LOW BACK PAIN RADIATING INTO BILATERAL HIPS AFTER FALL.  BACK SURGERY 4 YEARS AGO.  CT LUMBAR SPINE WITHOUT CONTRAST  Technique:  Multidetector CT imaging of the lumbar spine was performed without intravenous  contrast administration.  Multiplanar CT image reconstructions were also generated.  Comparison:  06/27/2012 plain films.  Lumbar spine CT 09/16/2009.  Findings:  Soft tissue windows demonstrate no significant retroperitoneal findings.  Bone windows demonstrate a transitional anatomy, with old last open disc being labeled L5-S1, as the 09/16/2009 exam.  Presuming this nomenclature, the patient is status post L3-L4 trans pedicle screw fixation.  A disc bulge at the L2-L3 level is grossly similar to 09/16/2009, and causes bilateral neural foraminal and central canal narrowing.  No acute hardware complication identified.  Remainder vertebral body height maintained.  Degenerative disc disease at T10- T11 is advanced.  Mild osteopenia.  No acute hardware complication.  IMPRESSION: Status post L3-L4 trans pedicle screw fixation, without acute hardware complication.  Maintenance of vertebral body height.  Grossly similar spondylosis, suboptimally evaluated by CT.  CT PELVIS WITHOUT CONTRAST  Technique:  Multidetector CT imaging of the pelvis was performed following the standard protocol without intravenous contrast.  Findings:  Soft tissue windows demonstrate pelvic floor laxity. Surgical changes in the anterior pelvic wall.  Hysterectomy.  No soft tissue hematoma.  Bone windows demonstrate degenerative sclerosis about the symphysis pubis.  Both femoral heads are located. No acute fracture.  IMPRESSION: No acute or post-traumatic deformity identified.   Original Report Authenticated By: Abigail Miyamoto, M.D.   1. Acute back pain     RA sat is 97% and I interpret to be normal   10:39 PM UA is neg, CT of pelvis and L spine shows no compression or other sig acute injury.  Pt is reassured.  She has refill of meds given to her by her PMD.    MDM  Pt with lumbar spasms, tender at upper left SI region.  Intact knee reflexes, good distal lower extremity strength.  Will get CT and re-check UA since pain seems worse.  Do not  suspect cord syndrome or spinal cord dysfunction.    Saddie Benders. Shogo Larkey, MD 07/01/12 2240

## 2012-07-01 NOTE — Discharge Instructions (Signed)
Back Pain, Adult  Low back pain is very common. About 1 in 5 people have back pain. The cause of low back pain is rarely dangerous. The pain often gets better over time. About half of people with a sudden onset of back pain feel better in just 2 weeks. About 8 in 10 people feel better by 6 weeks.   CAUSES  Some common causes of back pain include:  · Strain of the muscles or ligaments supporting the spine.  · Wear and tear (degeneration) of the spinal discs.  · Arthritis.  · Direct injury to the back.  DIAGNOSIS  Most of the time, the direct cause of low back pain is not known. However, back pain can be treated effectively even when the exact cause of the pain is unknown. Answering your caregiver's questions about your overall health and symptoms is one of the most accurate ways to make sure the cause of your pain is not dangerous. If your caregiver needs more information, he or she may order lab work or imaging tests (X-rays or MRIs). However, even if imaging tests show changes in your back, this usually does not require surgery.  HOME CARE INSTRUCTIONS  For many people, back pain returns. Since low back pain is rarely dangerous, it is often a condition that people can learn to manage on their own.   · Remain active. It is stressful on the back to sit or stand in one place. Do not sit, drive, or stand in one place for more than 30 minutes at a time. Take short walks on level surfaces as soon as pain allows. Try to increase the length of time you walk each day.  · Do not stay in bed. Resting more than 1 or 2 days can delay your recovery.  · Do not avoid exercise or work. Your body is made to move. It is not dangerous to be active, even though your back may hurt. Your back will likely heal faster if you return to being active before your pain is gone.  · Pay attention to your body when you  bend and lift. Many people have less discomfort when lifting if they bend their knees, keep the load close to their bodies, and  avoid twisting. Often, the most comfortable positions are those that put less stress on your recovering back.  · Find a comfortable position to sleep. Use a firm mattress and lie on your side with your knees slightly bent. If you lie on your back, put a pillow under your knees.  · Only take over-the-counter or prescription medicines as directed by your caregiver. Over-the-counter medicines to reduce pain and inflammation are often the most helpful. Your caregiver may prescribe muscle relaxant drugs. These medicines help dull your pain so you can more quickly return to your normal activities and healthy exercise.  · Put ice on the injured area.  · Put ice in a plastic bag.  · Place a towel between your skin and the bag.  · Leave the ice on for 15-20 minutes, 3-4 times a day for the first 2 to 3 days. After that, ice and heat may be alternated to reduce pain and spasms.  · Ask your caregiver about trying back exercises and gentle massage. This may be of some benefit.  · Avoid feeling anxious or stressed. Stress increases muscle tension and can worsen back pain. It is important to recognize when you are anxious or stressed and learn ways to manage it. Exercise is a great option.  SEEK MEDICAL CARE IF:  · You have pain that is not relieved with rest or   medicine.  · You have pain that does not improve in 1 week.  · You have new symptoms.  · You are generally not feeling well.  SEEK IMMEDIATE MEDICAL CARE IF:   · You have pain that radiates from your back into your legs.  · You develop new bowel or bladder control problems.  · You have unusual weakness or numbness in your arms or legs.  · You develop nausea or vomiting.  · You develop abdominal pain.  · You feel faint.  Document Released: 12/21/2004 Document Revised: 06/22/2011 Document Reviewed: 05/11/2010  [employer]® Patient Information ©2014 [employer].

## 2012-07-05 ENCOUNTER — Emergency Department (HOSPITAL_COMMUNITY): Payer: Medicare Other

## 2012-07-05 ENCOUNTER — Emergency Department (HOSPITAL_COMMUNITY)
Admission: EM | Admit: 2012-07-05 | Discharge: 2012-07-05 | Disposition: A | Discharge status: Home / Self Care | Payer: Medicare Other | Attending: Emergency Medicine | Admitting: Emergency Medicine

## 2012-07-05 ENCOUNTER — Encounter (HOSPITAL_COMMUNITY): Payer: Self-pay | Admitting: Emergency Medicine

## 2012-07-05 DIAGNOSIS — E119 Type 2 diabetes mellitus without complications: Secondary | ICD-10-CM | POA: Insufficient documentation

## 2012-07-05 DIAGNOSIS — R259 Unspecified abnormal involuntary movements: Secondary | ICD-10-CM | POA: Insufficient documentation

## 2012-07-05 DIAGNOSIS — R112 Nausea with vomiting, unspecified: Secondary | ICD-10-CM | POA: Insufficient documentation

## 2012-07-05 DIAGNOSIS — R42 Dizziness and giddiness: Secondary | ICD-10-CM

## 2012-07-05 DIAGNOSIS — I10 Essential (primary) hypertension: Secondary | ICD-10-CM | POA: Insufficient documentation

## 2012-07-05 DIAGNOSIS — Z862 Personal history of diseases of the blood and blood-forming organs and certain disorders involving the immune mechanism: Secondary | ICD-10-CM | POA: Insufficient documentation

## 2012-07-05 DIAGNOSIS — Z8673 Personal history of transient ischemic attack (TIA), and cerebral infarction without residual deficits: Secondary | ICD-10-CM | POA: Insufficient documentation

## 2012-07-05 DIAGNOSIS — Z8719 Personal history of other diseases of the digestive system: Secondary | ICD-10-CM | POA: Insufficient documentation

## 2012-07-05 DIAGNOSIS — Z79899 Other long term (current) drug therapy: Secondary | ICD-10-CM | POA: Insufficient documentation

## 2012-07-05 DIAGNOSIS — Z87891 Personal history of nicotine dependence: Secondary | ICD-10-CM | POA: Insufficient documentation

## 2012-07-05 DIAGNOSIS — Z8739 Personal history of other diseases of the musculoskeletal system and connective tissue: Secondary | ICD-10-CM | POA: Insufficient documentation

## 2012-07-05 DIAGNOSIS — E785 Hyperlipidemia, unspecified: Secondary | ICD-10-CM | POA: Insufficient documentation

## 2012-07-05 DIAGNOSIS — J45909 Unspecified asthma, uncomplicated: Secondary | ICD-10-CM | POA: Insufficient documentation

## 2012-07-05 DIAGNOSIS — Z85850 Personal history of malignant neoplasm of thyroid: Secondary | ICD-10-CM | POA: Insufficient documentation

## 2012-07-05 LAB — URINALYSIS, ROUTINE W REFLEX MICROSCOPIC
Glucose, UA: NEGATIVE mg/dL
Leukocytes, UA: NEGATIVE
Nitrite: NEGATIVE
Specific Gravity, Urine: 1.012 (ref 1.005–1.030)
pH: 6.5 (ref 5.0–8.0)

## 2012-07-05 LAB — GLUCOSE, CAPILLARY: Glucose-Capillary: 160 mg/dL — ABNORMAL HIGH (ref 70–99)

## 2012-07-05 LAB — CBC WITH DIFFERENTIAL/PLATELET
Basophils Absolute: 0 10*3/uL (ref 0.0–0.1)
HCT: 33.7 % — ABNORMAL LOW (ref 36.0–46.0)
Hemoglobin: 10.9 g/dL — ABNORMAL LOW (ref 12.0–15.0)
Lymphocytes Relative: 10 % — ABNORMAL LOW (ref 12–46)
Monocytes Absolute: 0.7 10*3/uL (ref 0.1–1.0)
Monocytes Relative: 5 % (ref 3–12)
Neutro Abs: 11.8 10*3/uL — ABNORMAL HIGH (ref 1.7–7.7)
Neutrophils Relative %: 85 % — ABNORMAL HIGH (ref 43–77)
WBC: 13.9 10*3/uL — ABNORMAL HIGH (ref 4.0–10.5)

## 2012-07-05 LAB — COMPREHENSIVE METABOLIC PANEL
ALT: 8 U/L (ref 0–35)
Albumin: 3.6 g/dL (ref 3.5–5.2)
Alkaline Phosphatase: 47 U/L (ref 39–117)
Potassium: 4.1 mEq/L (ref 3.5–5.1)
Sodium: 138 mEq/L (ref 135–145)
Total Protein: 6.9 g/dL (ref 6.0–8.3)

## 2012-07-05 LAB — POCT I-STAT TROPONIN I

## 2012-07-05 MED ORDER — MIDAZOLAM HCL 2 MG/2ML IJ SOLN
1.0000 mg | Freq: Once | INTRAMUSCULAR | Status: AC
  Administered 2012-07-05: 1 mg via INTRAVENOUS
  Filled 2012-07-05: qty 2

## 2012-07-05 MED ORDER — MECLIZINE HCL 25 MG PO TABS
12.5000 mg | ORAL_TABLET | Freq: Once | ORAL | Status: AC
  Administered 2012-07-05: 12.5 mg via ORAL
  Filled 2012-07-05: qty 1

## 2012-07-05 MED ORDER — GADOBENATE DIMEGLUMINE 529 MG/ML IV SOLN
20.0000 mL | Freq: Once | INTRAVENOUS | Status: AC | PRN
  Administered 2012-07-05: 20 mL via INTRAVENOUS

## 2012-07-05 MED ORDER — SODIUM CHLORIDE 0.9 % IV BOLUS (SEPSIS)
500.0000 mL | Freq: Once | INTRAVENOUS | Status: AC
  Administered 2012-07-05: 500 mL via INTRAVENOUS

## 2012-07-05 MED ORDER — MECLIZINE HCL 25 MG PO TABS: 25.0000 mg | ORAL_TABLET | Freq: Three times a day (TID) | ORAL | Status: DC | PRN

## 2012-07-05 NOTE — Progress Notes (Signed)
[facility].   Dorathy Kinsman, Pope 07/05/2012 1039am

## 2012-07-05 NOTE — ED Provider Notes (Signed)
Medical screening examination/treatment/procedure(s) were conducted as a shared visit with non-physician practitioner(s) and myself.  I personally evaluated the patient during the encounter   Malvin Johns, MD 07/05/12 1539

## 2012-07-05 NOTE — ED Notes (Signed)
Patient transported to MRI 

## 2012-07-05 NOTE — ED Provider Notes (Signed)
History    CSN: [id number] Arrival date & time 07/05/12  0718  First MD Initiated Contact with Patient 07/05/12 [phone]     Chief Complaint  Patient presents with  . Tremors  . Dizziness  . Emesis   (Consider location/radiation/quality/duration/timing/severity/associated sxs/prior Treatment) HPI Comments: Patient is a 72 year old female past medical history significant for DM, HTN, HLD, Hx TIA, Thyroid carcinoma, GERD, Asthma, Anemia presenting to the ED for sudden onset dizziness last evening w/ associated nausea, two episodes of vomiting, and left ear "popping" sensation. Patient states she went to bed still feeling dizzy but was able to sleep through the night and when she awoke this morning she had continued dizziness and also developed bilateral UE tremors. Patient states her dizziness is worsened with standing and ambulated and improved with sitting or laying down. Patient is still able to ambulate w/ use of her cane despite dizziness and denies any falls or LOC. Patient states that she has had this dizziness before three to four yeas ago when she had her stroke. Patient also states her only medication change recently was an increase in her Synthryoid two weeks ago. Patient denies any tinnitus, CP, SOB, abdominal pain, dysuria, numbness or tingling in extremities.    Patient is a 72 y.o. female presenting with vomiting. The history is provided by the patient.  Emesis Associated symptoms: no abdominal pain, no chills, no diarrhea, no headaches and no sore throat      Past Medical History  Diagnosis Date  . Diabetes mellitus   . Hyperlipidemia   . Hypertension   . History of transient ischemic attack (TIA)   . Thyroid carcinoma   . GERD (gastroesophageal reflux disease)   . OA (osteoarthritis)   . Asthma   . Anemia   . Chest pain, atypical    Past Surgical History  Procedure Laterality Date  . Cardiac catheterization  03/18/2009    NORMAL LEFT VENTRICULAR SIZE AND CONTRACTILITY  WITH NORMAL SYSTOLIC  FUNCTION. EF 60%  . Cardiolite study      SHOWED A SIGNIFICANT REVERSIBLE ANTERIOR WALL DEFECT CONSISTENT WITH ISCHEMIA. EF 55%  . Thyroidectomy    . Back surgery    . Lumbar fusion    . Total knee arthroplasty      right   Family History  Problem Relation Age of Onset  . Pneumonia Mother   . Heart attack Father    History  Substance Use Topics  . Smoking status: Former Research scientist (life sciences)  . Smokeless tobacco: Never Used  . Alcohol Use: No   OB History   Grav Para Term Preterm Abortions TAB SAB Ect Mult Living                 Review of Systems  Constitutional: Negative for fever, chills, diaphoresis and appetite change.  HENT: Negative for ear pain, sore throat, facial swelling, neck pain, neck stiffness and tinnitus.   Eyes: Negative for pain and redness.  Respiratory: Negative for cough, chest tightness and shortness of breath.   Cardiovascular: Negative for chest pain and palpitations.  Gastrointestinal: Positive for nausea and vomiting. Negative for abdominal pain, diarrhea and abdominal distention.  Genitourinary: Negative for dysuria, hematuria, flank pain and difficulty urinating.  Musculoskeletal: Negative.   Skin: Negative.   Neurological: Positive for dizziness and tremors. Negative for syncope, facial asymmetry, speech difficulty, light-headedness, numbness and headaches.    Allergies  Actos; Cephalexin; Norvasc; and Rosiglitazone maleate  Home Medications   Current Outpatient Rx  Name  Route  Sig  Dispense  Refill  . alendronate (FOSAMAX) 70 MG tablet   Oral   Take 70 mg by mouth every 7 (seven) days. Monday. Take with a full glass of water on an empty stomach.         Marland Kitchen amLODipine (NORVASC) 10 MG tablet   Oral   Take 10 mg by mouth daily.         Marland Kitchen atorvastatin (LIPITOR) 20 MG tablet   Oral   Take 20 mg by mouth daily.         . calcitRIOL (ROCALTROL) 0.25 MCG capsule   Oral   Take 0.5 mcg by mouth daily.         .  calcium-vitamin D (OSCAL WITH D) 500-200 MG-UNIT per tablet   Oral   Take 1 tablet by mouth daily with breakfast.         . dipyridamole-aspirin (AGGRENOX) 25-200 MG per 12 hr capsule   Oral   Take 1 capsule by mouth 2 (two) times daily.          . ferrous sulfate 325 (65 FE) MG EC tablet   Oral   Take 325 mg by mouth daily.         . hydrochlorothiazide 25 MG tablet   Oral   Take 25 mg by mouth daily.           Marland Kitchen levothyroxine (SYNTHROID, LEVOTHROID) 137 MCG tablet   Oral   Take 137 mcg by mouth daily before breakfast.         . lisinopril (PRINIVIL,ZESTRIL) 40 MG tablet   Oral   Take 40 mg by mouth 2 (two) times daily.           . metFORMIN (GLUCOPHAGE) 1000 MG tablet   Oral   Take 1,000 mg by mouth 2 (two) times daily.         . naproxen (NAPROSYN) 500 MG tablet   Oral   Take 1 tablet (500 mg total) by mouth 2 (two) times daily with a meal.   30 tablet   0   . oxyCODONE-acetaminophen (PERCOCET) 5-325 MG per tablet   Oral   Take 1 tablet by mouth every 4 (four) hours as needed for pain.   20 tablet   0   . pregabalin (LYRICA) 75 MG capsule   Oral   Take 75 mg by mouth 2 (two) times daily.         . meclizine (ANTIVERT) 25 MG tablet   Oral   Take 1 tablet (25 mg total) by mouth 3 (three) times daily as needed for dizziness.   28 tablet   0    BP 136/60  Pulse 75  Temp(Src) 97.9 F (36.6 C) (Oral)  Resp 18  SpO2 100% Physical Exam  Constitutional: She is oriented to person, place, and time. She appears well-developed and well-nourished.  HENT:  Head: Normocephalic and atraumatic.  Mouth/Throat: Uvula is midline. Mucous membranes are dry.  Eyes: Conjunctivae are normal.  Neck: Normal range of motion. Neck supple.  Cardiovascular: Normal rate, regular rhythm, normal heart sounds and intact distal pulses.   Pulmonary/Chest: Effort normal and breath sounds normal. No respiratory distress.  Abdominal: Soft. Bowel sounds are normal. She  exhibits no distension. There is no tenderness. There is no rebound and no guarding.  Lymphadenopathy:    She has no cervical adenopathy.  Neurological: She is alert and oriented to person, place, and time. She has normal strength. She  displays tremor. No cranial nerve deficit. GCS eye subscore is 4. GCS verbal subscore is 5. GCS motor subscore is 6.  Decreased sensation on right UE and LE.  EOMi w/o nystagmus inducing worsening dizziness per patient No pronator drift. Finger-nose-finger intact bilaterally.  Unable to perform heel-knee-shin d/t knee replacements per patient.  Able to ambulate w/ assistance.   Skin: Skin is warm and dry. She is not diaphoretic.    ED Course  Procedures (including critical care time)   Date: 07/05/2012  Rate: 91  Rhythm: normal sinus rhythm  QRS Axis: left  Intervals: borderline prolonged PR  ST/T Wave abnormalities: nonspecific T wave changes  Conduction Disutrbances:none  Narrative Interpretation:  Artifact in most leads.   Old EKG Reviewed: unchanged    Labs Reviewed  GLUCOSE, CAPILLARY - Abnormal; Notable for the following:    Glucose-Capillary 160 (*)    All other components within normal limits  COMPREHENSIVE METABOLIC PANEL - Abnormal; Notable for the following:    Glucose, Bld 147 (*)    GFR calc non Af Amer 55 (*)    GFR calc Af Amer 63 (*)    All other components within normal limits  CBC WITH DIFFERENTIAL - Abnormal; Notable for the following:    WBC 13.9 (*)    RBC 3.66 (*)    Hemoglobin 10.9 (*)    HCT 33.7 (*)    Neutrophils Relative % 85 (*)    Neutro Abs 11.8 (*)    Lymphocytes Relative 10 (*)    All other components within normal limits  URINALYSIS, ROUTINE W REFLEX MICROSCOPIC  CBC WITH DIFFERENTIAL  POCT I-STAT TROPONIN I   Ct Head Wo Contrast  07/05/2012   *RADIOLOGY REPORT*  Clinical Data: Sudden onset general weakness, dizziness, nausea and vomiting, tremors/shaking, fell earlier this week  CT HEAD WITHOUT  CONTRAST  Technique:  Contiguous axial images were obtained from the base of the skull through the vertex without contrast.  Comparison: 07/24/2008  Findings: Generalized atrophy. Normal ventricular morphology. No midline shift or mass effect. Small vessel chronic ischemic changes of deep cerebral white matter. Tiny old lacunar infarct left basal ganglia. No intracranial hemorrhage, mass lesion or evidence of acute infarction. No extra-axial fluid collections. Atherosclerotic calcifications at skull base. Hyperostosis frontalis interna. No acute bone or sinus abnormalities.  IMPRESSION: Atrophy with small vessel chronic ischemic changes of deep cerebral white matter. Tiny old lacunar infarct left basal ganglia. No acute intracranial abnormalities.   Original Report Authenticated By: Lavonia Dana, M.D.   Mr Jeri Cos Wo Contrast  07/05/2012   *RADIOLOGY REPORT*  Clinical Data: Sudden onset weakness, dizziness, nausea and tremors.  MRI HEAD WITHOUT AND WITH CONTRAST  Technique:  Multiplanar, multiecho pulse sequences of the brain and surrounding structures were obtained according to standard protocol without and with intravenous contrast  Contrast: 79mL MULTIHANCE GADOBENATE DIMEGLUMINE 529 MG/ML IV SOLN  Comparison: Head CT same day  Findings: Diffusion imaging does not show any acute or subacute infarction.  The brainstem and cerebellum are normal.  The cerebral hemispheres show mild chronic appearing small vessel change of the hemispheric deep white matter, of a degree often seen in persons of this age.  No cortical or large vessel territory infarction.  No mass lesion, hemorrhage, hydrocephalus or extra-axial collection. Sinuses, middle ears and mastoids are clear.  No skull or skull base lesion.  After contrast administration, no abnormal enhancement occurs.  IMPRESSION: No acute or reversible findings.  No cause of the patient's described  symptoms is identified.  Chronic small vessel disease of the hemispheric  white matter, frequently seen in persons of this age.   Original Report Authenticated By: Nelson Chimes, M.D.   1. Dizziness     MDM  Pt w/ acute onset vertigo symptoms w/ associated UE tremor. Neuro examination reveals diminished gross sensation on right extremities, otherwise no neurofocal deficits noted on neurologic examination. No cerebellar examination findings. Labs, EKG, and imaging reviewed. CT and MR revealed no acute or reversible finding or emergent cause for dizziness. Dr. Doy Mince consulted on case and agreed patient able to be discharged with PCP f/u. Patients symptoms improved after Antivert and IVF. Dizziness most likely peripheral in origin based on improving symptoms and negative imaging and neurology consultation. Return precautions discussed. Pt agreeable to discharge. Patient is agreeable to plan. Patient d/w with Dr. Tamera Punt, agrees with plan. Patient is stable at time of discharge       Harlow Mares, PA-C 07/05/12 1538

## 2012-07-05 NOTE — ED Notes (Addendum)
Per EMS report patient presents from assisted living facility "[facility]" with tremors, nausea, vomiting, dizziness, and "left ear has been popping for a few days." Patient reports that vomiting and tremors began today, that dizziness began yesterday night. Patient denies vomiting, nausea, stomach discomfort, or pain.

## 2012-07-05 NOTE — Consult Note (Signed)
Reason for Consult:Dizziness Referring Physician: Tamera Punt  CC: Dizziness  HPI: [name] is an 72 y.o. female who reports that on yesterday after being out all day doing errands noted while at home that she had acute onset of dizziness.  She describes it as being "woozy-headed".  She also reports that she was of balance.  Symptoms continued throughout the night.  She awakened today still dizzy and when she went to take her medications her symptoms worsened.  She decided it was time to present for evaluation at that tine time.  Since presentation she has had fluids and Meclizine and reports improvement in her symptoms.   Past Medical History  Diagnosis Date  . Diabetes mellitus   . Hyperlipidemia   . Hypertension   . History of transient ischemic attack (TIA)   . Thyroid carcinoma   . GERD (gastroesophageal reflux disease)   . OA (osteoarthritis)   . Asthma   . Anemia   . Chest pain, atypical     Past Surgical History  Procedure Laterality Date  . Cardiac catheterization  03/18/2009    NORMAL LEFT VENTRICULAR SIZE AND CONTRACTILITY WITH NORMAL SYSTOLIC  FUNCTION. EF 60%  . Cardiolite study      SHOWED A SIGNIFICANT REVERSIBLE ANTERIOR WALL DEFECT CONSISTENT WITH ISCHEMIA. EF 55%  . Thyroidectomy    . Back surgery    . Lumbar fusion    . Total knee arthroplasty      right    Family History  Problem Relation Age of Onset  . Pneumonia Mother   . Heart attack Father     Social History:  reports that she has quit smoking. She has never used smokeless tobacco. She reports that she does not drink alcohol or use illicit drugs.  Allergies  Allergen Reactions  . Actos (Pioglitazone Hydrochloride) Swelling  . Cephalexin Other (See Comments)    Doesn't remember   . Norvasc (Amlodipine Besylate)   . Rosiglitazone Maleate     Medications: I have reviewed the patient's current medications. Prior to Admission:  Current outpatient prescriptions: alendronate (FOSAMAX) 70 MG  tablet, Take 70 mg by mouth every 7 (seven) days. Monday. Take with a full glass of water on an empty stomach., Disp: , Rfl: ;   amLODipine (NORVASC) 10 MG tablet, Take 10 mg by mouth daily., Disp: , Rfl: ;   atorvastatin (LIPITOR) 20 MG tablet, Take 20 mg by mouth daily., Disp: , Rfl: ;  calcitRIOL (ROCALTROL) 0.25 MCG capsule, Take 0.5 mcg by mouth daily., Disp: , Rfl:  calcium-vitamin D (OSCAL WITH D) 500-200 MG-UNIT per tablet, Take 1 tablet by mouth daily with breakfast., Disp: , Rfl: ;  dipyridamole-aspirin (AGGRENOX) 25-200 MG per 12 hr capsule, Take 1 capsule by mouth 2 (two) times daily. , Disp: , Rfl: ;   ferrous sulfate 325 (65 FE) MG EC tablet, Take 325 mg by mouth daily., Disp: , Rfl: ;   hydrochlorothiazide 25 MG tablet, Take 25 mg by mouth daily.  , Disp: , Rfl:  levothyroxine (SYNTHROID, LEVOTHROID) 137 MCG tablet, Take 137 mcg by mouth daily before breakfast., Disp: , Rfl: ;   lisinopril (PRINIVIL,ZESTRIL) 40 MG tablet, Take 40 mg by mouth 2 (two) times daily.  , Disp: , Rfl: ;   metFORMIN (GLUCOPHAGE) 1000 MG tablet, Take 1,000 mg by mouth 2 (two) times daily., Disp: , Rfl: ;   naproxen (NAPROSYN) 500 MG tablet, Take 1 tablet (500 mg total) by mouth 2 (two) times daily with a  meal., Disp: 30 tablet, Rfl: 0 oxyCODONE-acetaminophen (PERCOCET) 5-325 MG per tablet, Take 1 tablet by mouth every 4 (four) hours as needed for pain., Disp: 20 tablet, Rfl: 0;   pregabalin (LYRICA) 75 MG capsule, Take 75 mg by mouth 2 (two) times daily., Disp: , Rfl: ;   meclizine (ANTIVERT) 25 MG tablet, Take 1 tablet (25 mg total) by mouth 3 (three) times daily as needed for dizziness., Disp: 28 tablet, Rfl: 0 escitalopram (LEXAPRO) 10 MG tablet, Take 10 mg by mouth daily.  , Disp: , Rfl:   ROS: History obtained from the patient  General ROS: negative for - chills, fatigue, fever, night sweats, weight gain or weight loss Psychological ROS: negative for - behavioral disorder, hallucinations, memory  difficulties, mood swings or suicidal ideation Ophthalmic ROS: negative for - blurry vision, double vision, eye pain or loss of vision ENT ROS: negative for - epistaxis, nasal discharge, oral lesions, sore throat, tinnitus or vertigo Allergy and Immunology ROS: negative for - hives or itchy/watery eyes Hematological and Lymphatic ROS: negative for - bleeding problems, bruising or swollen lymph nodes Endocrine ROS: negative for - galactorrhea, hair pattern changes, polydipsia/polyuria or temperature intolerance Respiratory ROS: negative for - cough, hemoptysis, shortness of breath or wheezing Cardiovascular ROS: LE edema Gastrointestinal ROS: negative for - abdominal pain, diarrhea, hematemesis, nausea/vomiting or stool incontinence Genito-Urinary ROS: negative for - dysuria, hematuria, incontinence or urinary frequency/urgency Musculoskeletal ROS: negative for - joint swelling or muscular weakness Neurological ROS: as noted in HPI Dermatological ROS: negative for rash and skin lesion changes  Physical Examination: Blood pressure 136/60, pulse 75, temperature 97.9 F (36.6 C), temperature source Oral, resp. rate 18, SpO2 100.00%.  Neurologic Examination Mental Status: Alert, oriented, thought content appropriate.  Speech fluent without evidence of aphasia.  Able to follow 3 step commands without difficulty. Cranial Nerves: II: Discs flat bilaterally; Visual fields grossly normal, pupils equal, round, reactive to light and accommodation III,IV, VI: ptosis not present, extra-ocular motions intact bilaterally with mild nystagmus on left lateral gaze V,VII: smile symmetric, facial light touch sensation normal bilaterally VIII: hearing normal bilaterally IX,X: gag reflex present XI: bilateral shoulder shrug XII: midline tongue extension Motor: Right : Upper extremity   5/5    Left:     Upper extremity   5/5  Lower extremity   5/5     Lower extremity   5/5 Tone and bulk:normal tone  throughout; no atrophy noted Sensory: Pinprick and light touch intact throughout, bilaterally Deep Tendon Reflexes: 2+ in the upper extremities and absent in the lower extremities.   Plantars: Right: equivocal   Left: downgoing Cerebellar: normal finger-to-nose and normal heel-to-shin test CV: pulses palpable throughout   Laboratory Studies:   Basic Metabolic Panel:  Recent Labs Lab 07/05/12 0915  NA 138  K 4.1  CL 100  CO2 28  GLUCOSE 147*  BUN 22  CREATININE 1.00  CALCIUM 8.9    Liver Function Tests:  Recent Labs Lab 07/05/12 0915  AST 9  ALT 8  ALKPHOS 47  BILITOT 0.3  PROT 6.9  ALBUMIN 3.6   No results found for this basename: LIPASE, AMYLASE,  in the last 168 hours No results found for this basename: AMMONIA,  in the last 168 hours  CBC:  Recent Labs Lab 07/05/12 0945  WBC 13.9*  NEUTROABS 11.8*  HGB 10.9*  HCT 33.7*  MCV 92.1  PLT 329    Cardiac Enzymes: No results found for this basename: CKTOTAL, CKMB, CKMBINDEX, TROPONINI,  in the last 168 hours  BNP: No components found with this basename: POCBNP,   CBG:  Recent Labs Lab 07/05/12 0724  GLUCAP 160*    Microbiology: Results for orders placed during the hospital encounter of 08/11/10  URINE CULTURE     Status: None   Collection Time    08/13/10 11:25 AM      Result Value Range Status   Specimen Description URINE, CLEAN CATCH   Final   Special Requests IMMUNE:NORM UT SYMPT:NEG   Final   Culture  Setup Time [id number]   Final   Colony Count >=100,000 COLONIES/ML   Final   Culture KLEBSIELLA OXYTOCA   Final   Report Status 08/15/2010 FINAL   Final   Organism ID, Bacteria KLEBSIELLA OXYTOCA   Final    Coagulation Studies: No results found for this basename: LABPROT, INR,  in the last 72 hours  Urinalysis:  Recent Labs Lab 07/01/12 2023 07/05/12 0812  COLORURINE YELLOW YELLOW  LABSPEC 1.022 1.012  PHURINE 5.0 6.5  GLUCOSEU NEGATIVE NEGATIVE  HGBUR NEGATIVE NEGATIVE   BILIRUBINUR NEGATIVE NEGATIVE  KETONESUR NEGATIVE NEGATIVE  PROTEINUR NEGATIVE NEGATIVE  UROBILINOGEN 0.2 0.2  NITRITE NEGATIVE NEGATIVE  LEUKOCYTESUR NEGATIVE NEGATIVE    Lipid Panel:     Component Value Date/Time   CHOL  Value: 166        ATP III CLASSIFICATION:  <200     mg/dL   Desirable  200-239  mg/dL   Borderline High  >=240    mg/dL   High        07/25/2008 0010   TRIG 117 07/25/2008 0010   HDL 58 07/25/2008 0010   CHOLHDL 2.9 07/25/2008 0010   VLDL 23 07/25/2008 0010   LDLCALC  Value: 85        Total Cholesterol/HDL:CHD Risk Coronary Heart Disease Risk Table                     Men   Women  1/2 Average Risk   3.4   3.3  Average Risk       5.0   4.4  2 X Average Risk   9.6   7.1  3 X Average Risk  23.4   11.0        Use the calculated Patient Ratio above and the CHD Risk Table to determine the patient's CHD Risk.        ATP III CLASSIFICATION (LDL):  <100     mg/dL   Optimal  100-129  mg/dL   Near or Above                    Optimal  130-159  mg/dL   Borderline  160-189  mg/dL   High  >190     mg/dL   Very High 07/25/2008 0010    HgbA1C:  Lab Results  Component Value Date   HGBA1C  Value: 6.2 (NOTE) The ADA recommends the following therapeutic goal for glycemic control related to Hgb A1c measurement: Goal of therapy: <6.5 Hgb A1c  Reference: American Diabetes Association: Clinical Practice Recommendations 2010, Diabetes Care, 2010, 33: (Suppl  1).* 10/04/2008    Urine Drug Screen:   No results found for this basename: labopia, cocainscrnur, labbenz, amphetmu, thcu, labbarb    Alcohol Level: No results found for this basename: ETH,  in the last 168 hours  Other results: EKG: SVT at 91 bpm.  Imaging: Ct Head Wo Contrast  07/05/2012   *RADIOLOGY REPORT*  Clinical Data: Sudden onset general weakness, dizziness, nausea and vomiting, tremors/shaking, fell earlier this week  CT HEAD WITHOUT CONTRAST  Technique:  Contiguous axial images were obtained from the base of the skull through  the vertex without contrast.  Comparison: 07/24/2008  Findings: Generalized atrophy. Normal ventricular morphology. No midline shift or mass effect. Small vessel chronic ischemic changes of deep cerebral white matter. Tiny old lacunar infarct left basal ganglia. No intracranial hemorrhage, mass lesion or evidence of acute infarction. No extra-axial fluid collections. Atherosclerotic calcifications at skull base. Hyperostosis frontalis interna. No acute bone or sinus abnormalities.  IMPRESSION: Atrophy with small vessel chronic ischemic changes of deep cerebral white matter. Tiny old lacunar infarct left basal ganglia. No acute intracranial abnormalities.   Original Report Authenticated By: Lavonia Dana, M.D.   Mr Jeri Cos Wo Contrast  07/05/2012   *RADIOLOGY REPORT*  Clinical Data: Sudden onset weakness, dizziness, nausea and tremors.  MRI HEAD WITHOUT AND WITH CONTRAST  Technique:  Multiplanar, multiecho pulse sequences of the brain and surrounding structures were obtained according to standard protocol without and with intravenous contrast  Contrast: 53mL MULTIHANCE GADOBENATE DIMEGLUMINE 529 MG/ML IV SOLN  Comparison: Head CT same day  Findings: Diffusion imaging does not show any acute or subacute infarction.  The brainstem and cerebellum are normal.  The cerebral hemispheres show mild chronic appearing small vessel change of the hemispheric deep white matter, of a degree often seen in persons of this age.  No cortical or large vessel territory infarction.  No mass lesion, hemorrhage, hydrocephalus or extra-axial collection. Sinuses, middle ears and mastoids are clear.  No skull or skull base lesion.  After contrast administration, no abnormal enhancement occurs.  IMPRESSION: No acute or reversible findings.  No cause of the patient's described symptoms is identified.  Chronic small vessel disease of the hemispheric white matter, frequently seen in persons of this age.   Original Report Authenticated By: Nelson Chimes, M.D.     Assessment/Plan: 72 year old female with previous stroke presenting with  Complaints of acute onset dizziness.  Exam only significant for som nystagmus.  Reports she has had some inner ear trouble in the past.  Also admits that she did not do a good job of hydrating herself on yesterday.  In work up MRI of the brain was performed.  On review no acute abnormalities were noted.  Do not suspect acute infarct.  Patient on Plavix at home.    Recommendations: 1.  ENT evaluation as an outpatient 2.  Meclizine prn 3.  Patient advised to hydrate adequately 4.  No further neurologic intervention is recommended at this time.  If further questions arise, please call or page at that time.  Thank you for allowing neurology to participate in the care of this patient.  Case discussed with Dr.  Neal Dy, MD [facility] [phone] 07/05/2012, 4:39 PM

## 2012-07-05 NOTE — ED Notes (Signed)
[id number] Expected date:<BR> Expected time:<BR> Means of arrival:<BR> Comments:<BR> EMS 72yo F; tremors, dizziness

## 2012-07-31 ENCOUNTER — Emergency Department (INDEPENDENT_AMBULATORY_CARE_PROVIDER_SITE_OTHER)
Admission: EM | Admit: 2012-07-31 | Discharge: 2012-07-31 | Disposition: A | Discharge status: Home / Self Care | Payer: Medicare Other | Source: Home / Self Care

## 2012-07-31 ENCOUNTER — Encounter (HOSPITAL_COMMUNITY): Payer: Self-pay | Admitting: *Deleted

## 2012-07-31 DIAGNOSIS — L0390 Cellulitis, unspecified: Secondary | ICD-10-CM

## 2012-07-31 DIAGNOSIS — L0291 Cutaneous abscess, unspecified: Secondary | ICD-10-CM

## 2012-07-31 MED ORDER — DOXYCYCLINE HYCLATE 100 MG PO CAPS: 100.0000 mg | ORAL_CAPSULE | Freq: Two times a day (BID) | ORAL | Status: DC

## 2012-07-31 NOTE — ED Notes (Signed)
Pt is here with complaints of right leg soreness and inflammation.  Pt thinks it might be an insect bite.  Small round area of bruising noted.

## 2012-07-31 NOTE — ED Provider Notes (Signed)
[name] is a 72 y.o. female who presents to Urgent Care today for right leg pain. Patinet noted right leg pain and redness starting about 6 days ago. She denies any injury. She thinks she may have had a bug bite. She denies any leg swelling, cough, chest pain or trouble breathing. She feels well otherwise. She has not tried any medicine yet.  No fevers or chills.    PMH reviewed. HTN, DM  History  Substance Use Topics  . Smoking status: Former Research scientist (life sciences)  . Smokeless tobacco: Never Used  . Alcohol Use: No   ROS as above Medications reviewed. No current facility-administered medications for this encounter.   Current Outpatient Prescriptions  Medication Sig Dispense Refill  . alendronate (FOSAMAX) 70 MG tablet Take 70 mg by mouth every 7 (seven) days. Monday. Take with a full glass of water on an empty stomach.      Marland Kitchen amLODipine (NORVASC) 10 MG tablet Take 10 mg by mouth daily.      Marland Kitchen atorvastatin (LIPITOR) 20 MG tablet Take 20 mg by mouth daily.      . calcitRIOL (ROCALTROL) 0.25 MCG capsule Take 0.5 mcg by mouth daily.      . calcium-vitamin D (OSCAL WITH D) 500-200 MG-UNIT per tablet Take 1 tablet by mouth daily with breakfast.      . dipyridamole-aspirin (AGGRENOX) 25-200 MG per 12 hr capsule Take 1 capsule by mouth 2 (two) times daily.       Marland Kitchen doxycycline (VIBRAMYCIN) 100 MG capsule Take 1 capsule (100 mg total) by mouth 2 (two) times daily.  20 capsule  0  . ferrous sulfate 325 (65 FE) MG EC tablet Take 325 mg by mouth daily.      . hydrochlorothiazide 25 MG tablet Take 25 mg by mouth daily.        Marland Kitchen levothyroxine (SYNTHROID, LEVOTHROID) 137 MCG tablet Take 137 mcg by mouth daily before breakfast.      . lisinopril (PRINIVIL,ZESTRIL) 40 MG tablet Take 40 mg by mouth 2 (two) times daily.        . meclizine (ANTIVERT) 25 MG tablet Take 1 tablet (25 mg total) by mouth 3 (three) times daily as needed for dizziness.  28 tablet  0  . metFORMIN (GLUCOPHAGE) 1000 MG tablet Take 1,000 mg  by mouth 2 (two) times daily.      . naproxen (NAPROSYN) 500 MG tablet Take 1 tablet (500 mg total) by mouth 2 (two) times daily with a meal.  30 tablet  0  . oxyCODONE-acetaminophen (PERCOCET) 5-325 MG per tablet Take 1 tablet by mouth every 4 (four) hours as needed for pain.  20 tablet  0  . pregabalin (LYRICA) 75 MG capsule Take 75 mg by mouth 2 (two) times daily.      . [DISCONTINUED] escitalopram (LEXAPRO) 10 MG tablet Take 10 mg by mouth daily.          Exam:  BP 125/76  Pulse 75  Temp(Src) 97.3 F (36.3 C) (Oral)  SpO2 97% Gen: Well NAD HEENT: EOMI,  MMM Lungs: CTABL Nl WOB Heart: RRR no MRG Abd: NABS, NT, ND Exts: Trace edema BL  LE, warm and well perfused.  Right leg: Small 2 cm area of erythemia and induration and tenderness medial mid tibia.   No results found for this or any previous visit (from the past 24 hour(s)). No results found.  Assessment and Plan: 72 y.o. female with probable cellulitis.  No evidence of DVT.  Not yet abscess yet.  Plan to treat with doxycycline for 10 days and follow up with primary care provider by the end of the week.  Discussed warning signs or symptoms. Please see discharge instructions. Patient expresses understanding.      Gregor Hams, MD 07/31/12 [phone]

## 2012-11-08 ENCOUNTER — Other Ambulatory Visit (HOSPITAL_COMMUNITY): Payer: Self-pay | Admitting: Endocrinology

## 2012-11-08 DIAGNOSIS — C73 Malignant neoplasm of thyroid gland: Secondary | ICD-10-CM

## 2012-11-20 ENCOUNTER — Encounter (HOSPITAL_COMMUNITY)
Admission: RE | Admit: 2012-11-20 | Discharge: 2012-11-20 | Disposition: A | Discharge status: Home / Self Care | Payer: Medicare Other | Source: Ambulatory Visit | Attending: Endocrinology | Admitting: Endocrinology

## 2012-11-20 DIAGNOSIS — C73 Malignant neoplasm of thyroid gland: Secondary | ICD-10-CM

## 2012-11-20 MED ORDER — THYROTROPIN ALFA 1.1 MG IM SOLR
0.9000 mg | INTRAMUSCULAR | Status: AC
  Administered 2012-11-20: 0.9 mg via INTRAMUSCULAR

## 2012-11-21 ENCOUNTER — Encounter (HOSPITAL_COMMUNITY)
Admission: RE | Admit: 2012-11-21 | Discharge: 2012-11-21 | Disposition: A | Discharge status: Home / Self Care | Payer: Medicare Other | Source: Ambulatory Visit | Attending: Endocrinology | Admitting: Endocrinology

## 2012-11-21 MED ORDER — THYROTROPIN ALFA 1.1 MG IM SOLR
0.9000 mg | INTRAMUSCULAR | Status: AC
  Administered 2012-11-21: 0.9 mg via INTRAMUSCULAR

## 2012-11-22 ENCOUNTER — Encounter (HOSPITAL_COMMUNITY)
Admission: RE | Admit: 2012-11-22 | Discharge: 2012-11-22 | Disposition: A | Discharge status: Home / Self Care | Payer: Medicare Other | Source: Ambulatory Visit | Attending: Endocrinology | Admitting: Endocrinology

## 2012-11-22 MED ORDER — SODIUM IODIDE I 131 CAPSULE
155.0000 | Freq: Once | INTRAVENOUS | Status: AC | PRN
  Administered 2012-11-22: 155 via ORAL

## 2012-12-01 ENCOUNTER — Encounter (HOSPITAL_COMMUNITY)
Admission: RE | Admit: 2012-12-01 | Discharge: 2012-12-01 | Disposition: A | Discharge status: Home / Self Care | Payer: Medicare Other | Source: Ambulatory Visit | Attending: Endocrinology | Admitting: Endocrinology

## 2012-12-01 DIAGNOSIS — C73 Malignant neoplasm of thyroid gland: Secondary | ICD-10-CM

## 2013-02-07 ENCOUNTER — Ambulatory Visit (INDEPENDENT_AMBULATORY_CARE_PROVIDER_SITE_OTHER): Payer: Medicare Other | Admitting: Podiatry

## 2013-02-07 ENCOUNTER — Encounter: Payer: Self-pay | Admitting: Podiatry

## 2013-02-07 VITALS — BP 141/64 | HR 78 | Resp 12

## 2013-02-07 DIAGNOSIS — M79609 Pain in unspecified limb: Secondary | ICD-10-CM

## 2013-02-07 DIAGNOSIS — B351 Tinea unguium: Secondary | ICD-10-CM

## 2013-02-07 DIAGNOSIS — E1149 Type 2 diabetes mellitus with other diabetic neurological complication: Secondary | ICD-10-CM

## 2013-02-07 NOTE — Progress Notes (Signed)
   Subjective:    Patient ID: [name], female    DOB: [birth year], 73 y.o.   MRN: [id number]  HPI '' TOENAILS TRIM.''  This patient presents today complaining of painful toenails and a keratoses on the fifth right toe. She's had no recent podiatric care for this problem was her last visit for similar service was 07/27/2011.  In addition she is requesting diabetic shoes which have been certified by [employer],MD of [address] [place]; [place] , [place] [address].   Review of Systems  HENT: Positive for hearing loss.   Cardiovascular: Positive for leg swelling.  Musculoskeletal: Positive for back pain.  Skin: Positive for color change.  All other systems reviewed and are negative.       Objective:   Physical Exam  Orientated x3 black female  Vascular: DP are 2/4 bilaterally. PTs 0/4 bilaterally.  Neurological: Sensation detained in monofilament wire intact 7/10 bilaterally. Vibratory sensation diminished bilaterally.  Dermatological: Incurvated, elongated, discolored toenails 2-5 bilaterally. Atrophic skin without any hair growth noted bilaterally. A small keratoses noted on the medial border of the fifth right toe which he said previous surgery.  Musculoskeletal: Right HAV noted.        Assessment & Plan:   Assessment: Symptomatic onychomycoses x8 Diabetic peripheral neuropathy Suspect peripheral arterial disease because of diminished posterior tibial pulses bilaterally.  Plan: Nails x8 debrided back in keratoses x1 debrided back without any bleeding. Will complete certification for diabetic shoes to the above-mentioned medical doctor.  Reappoint x3 months for nail debridement. The office will contact patient up on receipt of medical clearance for diabetic shoes for the indication of diabetic peripheral neuropathy, HAV deformity and peripheral arterial disease.  Marland Kitchen

## 2013-02-19 ENCOUNTER — Other Ambulatory Visit: Payer: Self-pay

## 2013-02-19 DIAGNOSIS — Z1231 Encounter for screening mammogram for malignant neoplasm of breast: Secondary | ICD-10-CM

## 2013-03-09 ENCOUNTER — Ambulatory Visit
Admission: RE | Admit: 2013-03-09 | Discharge: 2013-03-09 | Disposition: A | Discharge status: Home / Self Care | Payer: Self-pay | Source: Ambulatory Visit

## 2013-03-09 ENCOUNTER — Ambulatory Visit: Payer: Medicare Other

## 2013-03-09 DIAGNOSIS — Z1231 Encounter for screening mammogram for malignant neoplasm of breast: Secondary | ICD-10-CM

## 2013-03-11 ENCOUNTER — Other Ambulatory Visit: Payer: Self-pay | Admitting: Family Medicine

## 2013-03-16 ENCOUNTER — Ambulatory Visit: Payer: Medicare Other | Admitting: *Deleted

## 2013-03-16 ENCOUNTER — Telehealth: Payer: Self-pay | Admitting: *Deleted

## 2013-03-16 NOTE — Telephone Encounter (Signed)
Pt presents to be measured for diabetic shoes.  Done.

## 2013-05-02 ENCOUNTER — Ambulatory Visit: Payer: Medicare Other | Admitting: Podiatry

## 2013-05-09 ENCOUNTER — Ambulatory Visit: Payer: Medicare Other | Admitting: Podiatry

## 2013-05-31 ENCOUNTER — Encounter: Payer: Self-pay | Admitting: Cardiology

## 2013-07-06 ENCOUNTER — Other Ambulatory Visit: Payer: Self-pay | Admitting: Family Medicine

## 2013-07-09 ENCOUNTER — Ambulatory Visit (INDEPENDENT_AMBULATORY_CARE_PROVIDER_SITE_OTHER): Payer: Medicare Other | Admitting: Podiatry

## 2013-07-09 VITALS — BP 142/78 | HR 86 | Resp 12

## 2013-07-09 DIAGNOSIS — M79673 Pain in unspecified foot: Secondary | ICD-10-CM

## 2013-07-09 DIAGNOSIS — B351 Tinea unguium: Secondary | ICD-10-CM

## 2013-07-09 DIAGNOSIS — M79609 Pain in unspecified limb: Secondary | ICD-10-CM

## 2013-07-09 NOTE — Progress Notes (Signed)
   Subjective:    Patient ID: [name], female    DOB: 1940/07/04, 73 y.o.   MRN: [id number]  HPI PICK UP DIABETIC SHOES AND GIVEN INSTRUCTION.   (BROOKS SIZE 11.5 WIDE WITH 3 PAIR CUSTOMS IN SOLES.) The patient is also requesting debridement of painful toenails as well as dispensing diabetic shoes with custom insoles  Review of Systems     Objective:   Physical Exam  The toenails are elongated, incurvated, hypertrophic x10 The diabetic shoes with custom insoles are too short Custom insoles contour satisfactorily       Assessment & Plan:   Assessment: Symptomatic onychomycosis 6-10 Unsatisfactory fit of diabetic shoes  Plan: Nail 6-10 are debrided without any bleeding The diabetic shoes and custom insoles were not dispensed today because of unsatisfactory fit Our office will order a size 12 wide shoe and notify patient when the replacement diabetic shoe returns

## 2013-09-05 ENCOUNTER — Encounter: Payer: Self-pay | Admitting: Infectious Disease

## 2013-09-05 ENCOUNTER — Ambulatory Visit (INDEPENDENT_AMBULATORY_CARE_PROVIDER_SITE_OTHER): Payer: Medicare Other | Admitting: Infectious Disease

## 2013-09-05 VITALS — BP 117/63 | HR 84 | Temp 98.3°F | Wt 254.0 lb

## 2013-09-05 DIAGNOSIS — Z23 Encounter for immunization: Secondary | ICD-10-CM

## 2013-09-05 DIAGNOSIS — M869 Osteomyelitis, unspecified: Secondary | ICD-10-CM

## 2013-09-05 LAB — CBC WITH DIFFERENTIAL/PLATELET
Basophils Absolute: 0 10*3/uL (ref 0.0–0.1)
Basophils Relative: 0 % (ref 0–1)
EOS ABS: 0.1 10*3/uL (ref 0.0–0.7)
EOS PCT: 1 % (ref 0–5)
HCT: 30.6 % — ABNORMAL LOW (ref 36.0–46.0)
HEMOGLOBIN: 10 g/dL — AB (ref 12.0–15.0)
LYMPHS ABS: 1.5 10*3/uL (ref 0.7–4.0)
Lymphocytes Relative: 13 % (ref 12–46)
MCH: 29.4 pg (ref 26.0–34.0)
MCHC: 32.7 g/dL (ref 30.0–36.0)
MCV: 90 fL (ref 78.0–100.0)
MONOS PCT: 6 % (ref 3–12)
Monocytes Absolute: 0.7 10*3/uL (ref 0.1–1.0)
Neutro Abs: 9.5 10*3/uL — ABNORMAL HIGH (ref 1.7–7.7)
Neutrophils Relative %: 80 % — ABNORMAL HIGH (ref 43–77)
PLATELETS: 324 10*3/uL (ref 150–400)
RBC: 3.4 MIL/uL — AB (ref 3.87–5.11)
RDW: 13.6 % (ref 11.5–15.5)
WBC: 11.9 10*3/uL — ABNORMAL HIGH (ref 4.0–10.5)

## 2013-09-05 NOTE — Progress Notes (Signed)
Subjective:    Patient ID: [name], female    DOB: 01-28-1940, 73 y.o.   MRN: [id number]  HPI   73 year old AA lady is s.p L4-5 decompression and fusion by Dr. [name] in February 2009 for severe spondylolisthesis, spinal stenosis with severe lower extremity pain. Pt then developed L3-4 diskitis, and epidural and paraspinal fluid collections in May 2009 when she underwent I and D by Dr. [name]. Her cultures on oral antibiotics failed to yield an organism. She was treated with aztreonama nd vancomycin, and later changed to oral suppressive rifampin and doxycycline. She had been on doxycyline for several years. Her ESR, CRP have been normal. Sheundwernt left TKA by Dr. Theda Sers. I kept her on doxycline in the interi but then stopped it. She appears to have been off antibiotics for approx 7 months before I last saw her 1.5 years ago.  She today states that her back pain is better than when she had the infection but she still has a fair amount back pain and is on hydrocodone. She states that she saw the foramen of anxiety that she could have an infection she did not have fever chills or malaise. After further discussion we decided to consider MRI if her inflammatory markers were up.   Review of Systems  Constitutional: Negative for fever, chills, diaphoresis, activity change, appetite change, fatigue and unexpected weight change.  HENT: Negative for congestion, rhinorrhea, sinus pressure, sneezing, sore throat and trouble swallowing.   Eyes: Negative for photophobia and visual disturbance.  Respiratory: Negative for cough, chest tightness, shortness of breath, wheezing and stridor.   Cardiovascular: Negative for chest pain, palpitations and leg swelling.  Gastrointestinal: Negative for nausea, vomiting, abdominal pain, diarrhea, constipation, blood in stool, abdominal distention and anal bleeding.  Genitourinary: Negative for dysuria, hematuria, flank pain and difficulty urinating.    Musculoskeletal: Negative for arthralgias, back pain, gait problem, joint swelling and myalgias.  Skin: Negative for color change, pallor, rash and wound.  Neurological: Negative for dizziness, tremors, weakness and light-headedness.  Hematological: Negative for adenopathy. Does not bruise/bleed easily.  Psychiatric/Behavioral: Negative for behavioral problems, confusion, sleep disturbance, dysphoric mood, decreased concentration and agitation.       Objective:   Physical Exam  Constitutional: She is oriented to person, place, and time. She appears well-developed and well-nourished. No distress.  HENT:  Head: Normocephalic and atraumatic.  Mouth/Throat: Oropharynx is clear and moist. No oropharyngeal exudate.  Eyes: Conjunctivae and EOM are normal. Pupils are equal, round, and reactive to light. No scleral icterus.  Neck: Normal range of motion. Neck supple. No JVD present.  Cardiovascular: Normal rate, regular rhythm and normal heart sounds.  Exam reveals no gallop and no friction rub.   No murmur heard. Pulmonary/Chest: Effort normal and breath sounds normal. No respiratory distress. She has no wheezes. She has no rales. She exhibits no tenderness.  Abdominal: She exhibits no distension and no mass. There is no tenderness. There is no rebound and no guarding.  Musculoskeletal: She exhibits no edema and no tenderness.       Back:       Legs: Lymphadenopathy:    She has no cervical adenopathy.  Neurological: She is alert and oriented to person, place, and time. She has normal reflexes. She exhibits normal muscle tone. Coordination normal.  Skin: Skin is warm and dry. She is not diaphoretic. No erythema. No pallor.  Psychiatric: She has a normal mood and affect. Her behavior is normal. Judgment and thought content  normal.          Assessment & Plan:  Diskitis: doing well OFF antibiotics, check esr and crp today If they are up we will get another MRI of her spine if they are  stable and down the we will see her back in a year she can she may call us if she is having worsening symptoms.

## 2013-09-06 ENCOUNTER — Telehealth: Payer: Self-pay | Admitting: Infectious Disease

## 2013-09-06 DIAGNOSIS — M4646 Discitis, unspecified, lumbar region: Secondary | ICD-10-CM

## 2013-09-06 LAB — BASIC METABOLIC PANEL WITH GFR
BUN: 31 mg/dL — AB (ref 6–23)
CO2: 24 mEq/L (ref 19–32)
Calcium: 8.2 mg/dL — ABNORMAL LOW (ref 8.4–10.5)
Chloride: 103 mEq/L (ref 96–112)
Creat: 1.39 mg/dL — ABNORMAL HIGH (ref 0.50–1.10)
GFR, EST AFRICAN AMERICAN: 43 mL/min — AB
GFR, Est Non African American: 37 mL/min — ABNORMAL LOW
GLUCOSE: 116 mg/dL — AB (ref 70–99)
POTASSIUM: 5.4 meq/L — AB (ref 3.5–5.3)
Sodium: 140 mEq/L (ref 135–145)

## 2013-09-06 LAB — C-REACTIVE PROTEIN: CRP: 2 mg/dL — ABNORMAL HIGH (ref <0.60)

## 2013-09-06 LAB — SEDIMENTATION RATE: Sed Rate: 34 mm/hr — ABNORMAL HIGH (ref 0–22)

## 2013-09-06 NOTE — Telephone Encounter (Signed)
Would like to get MRI to reassure pt since ESR still high

## 2013-09-07 ENCOUNTER — Telehealth: Payer: Self-pay | Admitting: Licensed Clinical Social Worker

## 2013-09-07 NOTE — Telephone Encounter (Signed)
Excellent

## 2013-09-07 NOTE — Telephone Encounter (Signed)
Left patient a message that her MRI is 09/21/2013 at 8:00 am at [facility].

## 2013-09-07 NOTE — Telephone Encounter (Signed)
MRI approved. Authorization [id number].

## 2013-09-07 NOTE — Progress Notes (Signed)
MRI has been approved by insurance. THis will be scheduled.

## 2013-09-12 ENCOUNTER — Encounter: Payer: Self-pay | Admitting: Podiatry

## 2013-09-12 ENCOUNTER — Ambulatory Visit (INDEPENDENT_AMBULATORY_CARE_PROVIDER_SITE_OTHER): Payer: Medicare Other | Admitting: Podiatry

## 2013-09-12 VITALS — BP 124/86 | HR 77 | Resp 12

## 2013-09-12 DIAGNOSIS — E1149 Type 2 diabetes mellitus with other diabetic neurological complication: Secondary | ICD-10-CM

## 2013-09-12 DIAGNOSIS — M2010 Hallux valgus (acquired), unspecified foot: Secondary | ICD-10-CM

## 2013-09-12 NOTE — Progress Notes (Signed)
   Subjective:    Patient ID: [name], female    DOB: 1940-09-13, 73 y.o.   MRN: [id number]  HPI  PUO         ARIEL SHOES  SIZE 12   2 EXTRA WIDE WITH CUSTOM 3 PAIR INSERTS.  Patient presents today for dispensing of diabetic shoes with custom molded shoe inserts. The initial pair was dispensed on 07/09/2013 with son satisfactory fit  Review of Systems     Objective:   Physical Exam ARIEL SHOES  SIZE 12   2 EXTRA WIDE WITH CUSTOM 3 PAIRS OF CUSTOM INSERTS were dispensed The shoes and custom molded multilaminated foot orthotics fit satisfactorily        Assessment & Plan:   Assessment: Satisfactory fit of custom foot orthotics and diabetic shoes Indication for shoes HAV deformity Diabetic with neurological manifestations  Plan: Shoes and wearing instruction provided Reappoint yearly or at patient's request

## 2013-09-21 ENCOUNTER — Ambulatory Visit (HOSPITAL_COMMUNITY)
Admission: RE | Admit: 2013-09-21 | Discharge: 2013-09-21 | Disposition: A | Discharge status: Home / Self Care | Payer: Medicare Other | Source: Ambulatory Visit | Attending: Infectious Disease | Admitting: Infectious Disease

## 2013-09-21 DIAGNOSIS — M47814 Spondylosis without myelopathy or radiculopathy, thoracic region: Secondary | ICD-10-CM | POA: Diagnosis not present

## 2013-09-21 DIAGNOSIS — M4646 Discitis, unspecified, lumbar region: Secondary | ICD-10-CM

## 2013-09-21 DIAGNOSIS — M47817 Spondylosis without myelopathy or radiculopathy, lumbosacral region: Secondary | ICD-10-CM | POA: Insufficient documentation

## 2013-09-21 DIAGNOSIS — M519 Unspecified thoracic, thoracolumbar and lumbosacral intervertebral disc disorder: Secondary | ICD-10-CM | POA: Diagnosis present

## 2013-09-21 MED ORDER — GADOBENATE DIMEGLUMINE 529 MG/ML IV SOLN
10.0000 mL | Freq: Once | INTRAVENOUS | Status: AC | PRN
  Administered 2013-09-21: 10 mL via INTRAVENOUS

## 2013-10-01 ENCOUNTER — Telehealth: Payer: Self-pay | Admitting: *Deleted

## 2013-10-01 NOTE — Telephone Encounter (Signed)
Requesting call from MD re:  recent MRI results.  MRI completed 09/21/13.  MD please call pt.  Next appt. 10/17/13 w/ Dr. Tommy Medal.

## 2013-10-02 NOTE — Telephone Encounter (Signed)
HER MRI IS FINE

## 2013-10-02 NOTE — Telephone Encounter (Signed)
Shared Dr. Derek Mound message with the pt.  Pt verbalized understanding.

## 2013-10-08 ENCOUNTER — Ambulatory Visit: Payer: Medicare Other | Admitting: Podiatry

## 2013-10-17 ENCOUNTER — Ambulatory Visit (INDEPENDENT_AMBULATORY_CARE_PROVIDER_SITE_OTHER): Payer: Medicare Other | Admitting: Infectious Disease

## 2013-10-17 ENCOUNTER — Encounter: Payer: Self-pay | Admitting: Infectious Disease

## 2013-10-17 VITALS — BP 128/70 | HR 78 | Temp 97.8°F | Wt 252.0 lb

## 2013-10-17 DIAGNOSIS — K6812 Psoas muscle abscess: Secondary | ICD-10-CM

## 2013-10-17 DIAGNOSIS — G061 Intraspinal abscess and granuloma: Secondary | ICD-10-CM

## 2013-10-17 DIAGNOSIS — I10 Essential (primary) hypertension: Secondary | ICD-10-CM

## 2013-10-17 DIAGNOSIS — M10242 Drug-induced gout, left hand: Secondary | ICD-10-CM

## 2013-10-17 DIAGNOSIS — M109 Gout, unspecified: Secondary | ICD-10-CM | POA: Insufficient documentation

## 2013-10-17 MED ORDER — COLCHICINE 0.6 MG PO TABS: 0.6000 mg | ORAL_TABLET | ORAL | Status: DC

## 2013-10-17 NOTE — Patient Instructions (Addendum)
  For your gout please take colchicine 0.6mg  tablet   First day take two tablets in the afternoon then then one tablet twice daily till gout resolved  STOP TAKING YOUR atorvastatin (lipitor) while we are treating the gout  PLEASE FOLLOWUP WITH YOUR PRIMARY CARE MD

## 2013-10-17 NOTE — Progress Notes (Signed)
Subjective:    Patient ID: [name], female    DOB: 11/18/1940, 73 y.o.   MRN: [id number]  HPI   73 year old AA lady is s.p L4-5 decompression and fusion by Dr. [name] in February 2009 for severe spondylolisthesis, spinal stenosis with severe lower extremity pain. Pt then developed L3-4 diskitis, and epidural and paraspinal fluid collections in May 2009 when she underwent I and D by Dr. [name]. Her cultures on oral antibiotics failed to yield an organism. She was treated with aztreonama nd vancomycin, and later changed to oral suppressive rifampin and doxycycline. She had been on doxycyline for several years. Her ESR, CRP have been normal. Sheundwernt left TKA by Dr. Theda Sers. I kept her on doxycline in the interi but then stopped it. She appears to have been off antibiotics for approx 7 months before I last saw her 1.5 years ago.  At her last visit states that her back pain is better than when she had the infection but she still has a fair amount back pain and is on hydrocodone. She states that she saw the  anxiety that she could have an infection she did not have fever chills or malaise. After further discussion we decided to consider MRI if her inflammatory markers were up.  MRI was completely reassuring.  Today she comes back for followup and has what appears to be an acute gout flare in hand with exquisite tenderness in the first MCP joint. She is requesting meds for gout.   Review of Systems  Constitutional: Negative for fever, chills, diaphoresis, activity change, appetite change, fatigue and unexpected weight change.  HENT: Negative for congestion, rhinorrhea, sinus pressure, sneezing, sore throat and trouble swallowing.   Eyes: Negative for photophobia and visual disturbance.  Respiratory: Negative for cough, chest tightness, shortness of breath, wheezing and stridor.   Cardiovascular: Negative for chest pain, palpitations and leg swelling.  Gastrointestinal: Negative for  nausea, vomiting, abdominal pain, diarrhea, constipation, blood in stool, abdominal distention and anal bleeding.  Genitourinary: Negative for dysuria, hematuria, flank pain and difficulty urinating.  Musculoskeletal: Positive for arthralgias and joint swelling. Negative for back pain, gait problem and myalgias.  Skin: Negative for color change, pallor, rash and wound.  Neurological: Negative for dizziness, tremors, weakness and light-headedness.  Hematological: Negative for adenopathy. Does not bruise/bleed easily.  Psychiatric/Behavioral: Negative for behavioral problems, confusion, sleep disturbance, dysphoric mood, decreased concentration and agitation.       Objective:   Physical Exam  Constitutional: She is oriented to person, place, and time. She appears well-developed and well-nourished. No distress.  HENT:  Head: Normocephalic and atraumatic.  Mouth/Throat: Oropharynx is clear and moist. No oropharyngeal exudate.  Eyes: Conjunctivae and EOM are normal. Pupils are equal, round, and reactive to light. No scleral icterus.  Neck: Normal range of motion. Neck supple.  Cardiovascular: Normal rate and regular rhythm.   Pulmonary/Chest: Effort normal and breath sounds normal. No respiratory distress. She has no wheezes.  Abdominal: She exhibits no distension and no mass.  Musculoskeletal: She exhibits no edema and no tenderness.       Back:       Hands:      Legs: Neurological: She is alert and oriented to person, place, and time. She exhibits normal muscle tone. Coordination normal.  Skin: Skin is warm and dry. She is not diaphoretic. No erythema. No pallor.  Psychiatric: She has a normal mood and affect. Her behavior is normal. Judgment and thought content normal.  Assessment & Plan:  Acute gout flare: give colchicine 0.65m two pills x1 repeat at night and take bid until flare resolves and fu with PCP. likley ppt by her hCTZ. I spent greater than 25 minutes with the  patient including greater than 50% of time in face to face counsel of the patient and in coordination of their care.    Diskitis: doing well OFF antibiotics, MRI reassuring.

## 2013-12-19 ENCOUNTER — Encounter: Payer: Self-pay | Admitting: Podiatry

## 2013-12-19 ENCOUNTER — Ambulatory Visit (INDEPENDENT_AMBULATORY_CARE_PROVIDER_SITE_OTHER): Payer: Medicare Other | Admitting: Podiatry

## 2013-12-19 ENCOUNTER — Ambulatory Visit (INDEPENDENT_AMBULATORY_CARE_PROVIDER_SITE_OTHER): Payer: Medicare Other

## 2013-12-19 DIAGNOSIS — M79676 Pain in unspecified toe(s): Secondary | ICD-10-CM

## 2013-12-19 DIAGNOSIS — R52 Pain, unspecified: Secondary | ICD-10-CM

## 2013-12-19 DIAGNOSIS — M722 Plantar fascial fibromatosis: Secondary | ICD-10-CM

## 2013-12-19 DIAGNOSIS — B351 Tinea unguium: Secondary | ICD-10-CM

## 2013-12-19 NOTE — Patient Instructions (Signed)
Bent - Knee Calf Stretch  1) Stand an arm's length away from a wall. Place the palms of your hands on the wall. Step forward about 12 inches with the opposite foot.  2) Keeping toes pointed forward and both heels on the floor, bend both knees and lean forward. Hold this position for 60 seconds. Don't arch your back and don't hunch your shoulders.  3) Repeat this twice.  DO THIS STRETCHING TECHNIQUE 3 TIMES A DAY.   Stretching Exercises before Standing      Pull your toes up toward your nose and hold for 1 minute before standing.  A towel can assist with this exercise if you put the towel under the ball of your foot. This exercise reduces the intense    pain associated when changing from a seated to a standing position. This stretch can usually be the most beneficial if done before getting out of bed in the mornings. Plantar Fasciitis Plantar fasciitis is a common condition that causes foot pain. It is soreness (inflammation) of the band of tough fibrous tissue on the bottom of the foot that runs from the heel bone (calcaneus) to the ball of the foot. The cause of this soreness may be from excessive standing, poor fitting shoes, running on hard surfaces, being overweight, having an abnormal walk, or overuse (this is common in runners) of the painful foot or feet. It is also common in aerobic exercise dancers and ballet dancers. SYMPTOMS  Most people with plantar fasciitis complain of:  Severe pain in the morning on the bottom of their foot especially when taking the first steps out of bed. This pain recedes after a few minutes of walking.  Severe pain is experienced also during walking following a long period of inactivity.  Pain is worse when walking barefoot or up stairs DIAGNOSIS   Your caregiver will diagnose this condition by examining and feeling your foot.  Special tests such as X-rays of your foot, are usually not needed. PREVENTION   Consult a sports medicine professional  before beginning a new exercise program.  Walking programs offer a good workout. With walking there is a lower chance of overuse injuries common to runners. There is less impact and less jarring of the joints.  Begin all new exercise programs slowly. If problems or pain develop, decrease the amount of time or distance until you are at a comfortable level.  Wear good shoes and replace them regularly.  Stretch your foot and the heel cords at the back of the ankle (Achilles tendon) both before and after exercise.  Run or exercise on even surfaces that are not hard. For example, asphalt is better than pavement.  Do not run barefoot on hard surfaces.  If using a treadmill, vary the incline.  Do not continue to workout if you have foot or joint problems. Seek professional help if they do not improve. HOME CARE INSTRUCTIONS   Avoid activities that cause you pain until you recover.  Use ice or cold packs on the problem or painful areas after working out.  Only take over-the-counter or prescription medicines for pain, discomfort, or fever as directed by your caregiver.  Soft shoe inserts or athletic shoes with air or gel sole cushions may be helpful.  If problems continue or become more severe, consult a sports medicine caregiver or your own health care provider. Cortisone is a potent anti-inflammatory medication that may be injected into the painful area. You can discuss this treatment with your caregiver. MAKE   SURE YOU:   Understand these instructions.  Will watch your condition.  Will get help right away if you are not doing well or get worse. Document Released: 09/15/2000 Document Revised: 03/15/2011 Document Reviewed: 11/15/2007 Exit[employer] Patient Information 2015 [employer]. This information is not intended to replace advice given to you by your health care provider. Make sure you discuss any questions you have with your health care provider.  

## 2013-12-19 NOTE — Progress Notes (Signed)
   Subjective:    Patient ID: [name], female    DOB: July 29, 1940, 73 y.o.   MRN: [id number]  HPI  NEW PROBLEM:  N-SORE L-LT BOTTOM OF THE HEEL D-1 WEEK O-SLOWLY C-WORSE A-PRESSURE T-SOAK WITH EPSON SALT AND RUBBING ALCOHOL  Patient also requests debridement of painful toenails today  Review of Systems  Cardiovascular: Positive for leg swelling.  Musculoskeletal: Positive for gait problem.  All other systems reviewed and are negative.      Objective:   Physical Exam  Orientated 3  Vascular: DP pulses 2/4 bilaterally PT pulses 2/4 bilaterally Capillary reflex immediate bilaterally  Neurological: Sensation to 10 g monofilament wire intact 4/5 bilaterally Vibratory sensation nonreactive bilaterally Ankle reflex equal and reactive bilaterally  Dermatological: The toenails are elongated, hypertrophic, incurvated 6-10 and tender to palpation  Musculoskeletal: Patient walks slowly with cane Palpable tenderness medial plantar fascial insertional area left which duplicates area is discomfort in the left heel. When walking she favors the left foot. Pes planus bilaterally  X-ray examination left foot  Intact bony structure without fracture and/or dislocation Posterior and inferior calcaneal spurs Dorsal exostosis medial cuneiform  Radiographic impression: No acute bony abnormality noted left foot    Assessment & Plan:   Assessment: Satisfactory vascular status Diabetic peripheral neuropathy Symptomatic onychomycoses 6-10 Plantar fasciitis left  Plan: Debrided toenails 10 without a bleeding  Offered patient Kenalog injection making patient aware that her blood glucose would temporarily elevate. She verbally consents. The skin was prepped with alcohol and Betadine and 10 mg of Kenalog mixed with 10 mg of plain Xylocaine and 2.5 mg of of 0.5% plain Marcaine injected inferior heel left for Kenalog injection #1. The patient tolerates injection without any  difficulty.  Shoeing and stretching discussed for treatment of plantar fasciitis  Reappoint as needed or at three-month intervals for nail debridement

## 2013-12-20 ENCOUNTER — Encounter: Payer: Self-pay | Admitting: Podiatry

## 2013-12-20 DIAGNOSIS — M722 Plantar fascial fibromatosis: Secondary | ICD-10-CM

## 2013-12-20 MED ORDER — TRIAMCINOLONE ACETONIDE 10 MG/ML IJ SUSP
10.0000 mg | Freq: Once | INTRAMUSCULAR | Status: AC
  Administered 2013-12-20: 10 mg

## 2013-12-22 ENCOUNTER — Emergency Department (INDEPENDENT_AMBULATORY_CARE_PROVIDER_SITE_OTHER)
Admission: EM | Admit: 2013-12-22 | Discharge: 2013-12-22 | Disposition: A | Discharge status: Home / Self Care | Payer: Medicare Other | Source: Home / Self Care | Attending: Emergency Medicine | Admitting: Emergency Medicine

## 2013-12-22 ENCOUNTER — Encounter (HOSPITAL_COMMUNITY): Payer: Self-pay | Admitting: *Deleted

## 2013-12-22 DIAGNOSIS — M722 Plantar fascial fibromatosis: Secondary | ICD-10-CM

## 2013-12-22 MED ORDER — OXYCODONE-ACETAMINOPHEN 5-325 MG PO TABS: 1.0000 | ORAL_TABLET | Freq: Four times a day (QID) | ORAL | Status: DC | PRN

## 2013-12-22 NOTE — ED Provider Notes (Signed)
CSN: [id number]     Arrival date & time 12/22/13  1413 History   First MD Initiated Contact with Patient 12/22/13 1510     Chief Complaint  Patient presents with  . Foot Pain   (Consider location/radiation/quality/duration/timing/severity/associated sxs/prior Treatment) HPI          73 year old female presents complaining of left heel pain. Her symptoms began about one week ago. She was seen by her podiatrist 4 days ago, had x-rays, was diagnosed with plantar fasciitis. She had an injection into the plantar fascia with 10 mg of Kenalog, she got no relief from this. She is also performing stretching exercises that are not helping either.  Pain worse in the AM. Last night she took one of her daughters Percocet which was very helpful for the pain today feels slightly better. She denies any change in her chronic LE swelling. She denies any injury. No systemic symptoms.   Past Medical History  Diagnosis Date  . Diabetes mellitus   . Hyperlipidemia   . Hypertension   . History of transient ischemic attack (TIA)   . Thyroid carcinoma   . GERD (gastroesophageal reflux disease)   . OA (osteoarthritis)   . Asthma   . Anemia   . Chest pain, atypical    Past Surgical History  Procedure Laterality Date  . Cardiac catheterization  03/18/2009    NORMAL LEFT VENTRICULAR SIZE AND CONTRACTILITY WITH NORMAL SYSTOLIC  FUNCTION. EF 60%  . Cardiolite study      SHOWED A SIGNIFICANT REVERSIBLE ANTERIOR WALL DEFECT CONSISTENT WITH ISCHEMIA. EF 55%  . Thyroidectomy    . Back surgery    . Lumbar fusion    . Total knee arthroplasty      right   Family History  Problem Relation Age of Onset  . Pneumonia Mother   . Heart attack Father    History  Substance Use Topics  . Smoking status: Former Research scientist (life sciences)  . Smokeless tobacco: Never Used  . Alcohol Use: No   OB History    No data available     Review of Systems  Cardiovascular: Positive for leg swelling (chronic).  Musculoskeletal:       Left heel  pain  Neurological: Negative for numbness.  All other systems reviewed and are negative.   Allergies  Actos; Ativan; Cephalexin; Norvasc; Rosiglitazone; and Rosiglitazone maleate  Home Medications   Prior to Admission medications   Medication Sig Start Date End Date Taking? Authorizing Provider  alendronate (FOSAMAX) 70 MG tablet Take 70 mg by mouth every 7 (seven) days. Monday. Take with a full glass of water on an empty stomach.    Historical Provider, MD  amLODipine (NORVASC) 10 MG tablet Take 10 mg by mouth daily.    Historical Provider, MD  atorvastatin (LIPITOR) 20 MG tablet Take 20 mg by mouth daily.    Historical Provider, MD  calcitRIOL (ROCALTROL) 0.25 MCG capsule Take 0.5 mcg by mouth daily.    Historical Provider, MD  calcium-vitamin D (OSCAL WITH D) 500-200 MG-UNIT per tablet Take 1 tablet by mouth daily with breakfast.    Historical Provider, MD  colchicine 0.6 MG tablet Take 1 tablet (0.6 mg total) by mouth as directed. 10/17/13   Truman Hayward, MD  dipyridamole-aspirin (AGGRENOX) 25-200 MG per 12 hr capsule Take 1 capsule by mouth 2 (two) times daily.     Historical Provider, MD  ferrous sulfate 325 (65 FE) MG EC tablet Take 325 mg by mouth daily.  Historical Provider, MD  hydrochlorothiazide 25 MG tablet Take 25 mg by mouth daily.      Historical Provider, MD  HYDROcodone-acetaminophen (NORCO/VICODIN) 5-325 MG per tablet  11/23/13   Historical Provider, MD  levothyroxine (SYNTHROID, LEVOTHROID) 137 MCG tablet Take 137 mcg by mouth daily before breakfast.    Historical Provider, MD  lisinopril (PRINIVIL,ZESTRIL) 40 MG tablet Take 40 mg by mouth 2 (two) times daily.      Historical Provider, MD  LYRICA 150 MG capsule  09/28/13   Historical Provider, MD  meclizine (ANTIVERT) 25 MG tablet Take 1 tablet (25 mg total) by mouth 3 (three) times daily as needed for dizziness. 07/05/12   Jennifer L Piepenbrink, PA-C  metFORMIN (GLUCOPHAGE) 1000 MG tablet Take 1,000 mg by mouth 2  (two) times daily.    Historical Provider, MD  naproxen (NAPROSYN) 500 MG tablet Take 1 tablet (500 mg total) by mouth 2 (two) times daily with a meal. 06/27/12   Johnna Acosta, MD  oxyCODONE-acetaminophen (PERCOCET) 5-325 MG per tablet Take 1 tablet by mouth every 4 (four) hours as needed for pain. 06/27/12   Johnna Acosta, MD  oxyCODONE-acetaminophen (PERCOCET/ROXICET) 5-325 MG per tablet Take 1 tablet by mouth every 6 (six) hours as needed for moderate pain or severe pain. 12/22/13   Freeman Caldron Vicky Mccanless, PA-C  pregabalin (LYRICA) 75 MG capsule Take 75 mg by mouth 2 (two) times daily.    Historical Provider, MD   BP 136/66 mmHg  Pulse 70  Temp(Src) 98 F (36.7 C) (Oral)  Resp 12  SpO2 100% Physical Exam  Constitutional: She is oriented to person, place, and time. Vital signs are normal. She appears well-developed and well-nourished. No distress.  HENT:  Head: Normocephalic and atraumatic.  Cardiovascular: Normal rate, regular rhythm and normal heart sounds.   Pulses:      Dorsalis pedis pulses are 2+ on the left side.  chronic lower extremity edema, equal bilaterally  Pulmonary/Chest: Effort normal and breath sounds normal. No respiratory distress.  Musculoskeletal:       Left foot: There is tenderness (point tender at the heel at the insertion of the plantar fascia onto the calcaneus ). There is normal range of motion, no bony tenderness, no swelling and normal capillary refill.  Neurological: She is alert and oriented to person, place, and time. She has normal strength. No sensory deficit. Coordination normal.  Skin: Skin is warm and dry. No rash noted. She is not diaphoretic.  Psychiatric: She has a normal mood and affect. Judgment normal.  Nursing note and vitals reviewed.   ED Course  Procedures (including critical care time) Labs Review Labs Reviewed - No data to display  Imaging Review No results found.   MDM   1. Plantar fasciitis of left foot    Discussed with her  that we will not do another injection because she just had one. We'll give a few tablets of Percocet, she will get a heel cup for her shoe, and also plantar fascia stretching exercises were discussed with the patient. Follow-up with podiatry   Meds ordered this encounter  Medications  . oxyCODONE-acetaminophen (PERCOCET/ROXICET) 5-325 MG per tablet    Sig: Take 1 tablet by mouth every 6 (six) hours as needed for moderate pain or severe pain.    Dispense:  10 tablet    Refill:  0       Liam Graham, PA-C 12/22/13 1554

## 2013-12-22 NOTE — ED Notes (Signed)
Pt  Reports  Pain  l heel     X 4  Days       Seen  By  Cassell Smiles       Reports  Had   X  Ray   Of  Her  Heel as   Well as  Injection      - she  reports  The  Pain is worse           She  denys  A  specefic injury

## 2013-12-22 NOTE — Discharge Instructions (Signed)
Plantar Fasciitis (Heel Spur Syndrome) with Rehab The plantar fascia is a fibrous, ligament-like, soft-tissue structure that spans the bottom of the foot. Plantar fasciitis is a condition that causes pain in the foot due to inflammation of the tissue. SYMPTOMS   Pain and tenderness on the underneath side of the foot.  Pain that worsens with standing or walking. CAUSES  Plantar fasciitis is caused by irritation and injury to the plantar fascia on the underneath side of the foot. Common mechanisms of injury include:  Direct trauma to bottom of the foot.  Damage to a small nerve that runs under the foot where the main fascia attaches to the heel bone.  Stress placed on the plantar fascia due to bone spurs. RISK INCREASES WITH:   Activities that place stress on the plantar fascia (running, jumping, pivoting, or cutting).  Poor strength and flexibility.  Improperly fitted shoes.  Tight calf muscles.  Flat feet.  Failure to warm-up properly before activity.  Obesity. PREVENTION  Warm up and stretch properly before activity.  Allow for adequate recovery between workouts.  Maintain physical fitness:  Strength, flexibility, and endurance.  Cardiovascular fitness.  Maintain a health body weight.  Avoid stress on the plantar fascia.  Wear properly fitted shoes, including arch supports for individuals who have flat feet. PROGNOSIS  If treated properly, then the symptoms of plantar fasciitis usually resolve without surgery. However, occasionally surgery is necessary. RELATED COMPLICATIONS   Recurrent symptoms that may result in a chronic condition.  Problems of the lower back that are caused by compensating for the injury, such as limping.  Pain or weakness of the foot during push-off following surgery.  Chronic inflammation, scarring, and partial or complete fascia tear, occurring more often from repeated injections. TREATMENT  Treatment initially involves the use of  ice and medication to help reduce pain and inflammation. The use of strengthening and stretching exercises may help reduce pain with activity, especially stretches of the Achilles tendon. These exercises may be performed at home or with a therapist. Your caregiver may recommend that you use heel cups of arch supports to help reduce stress on the plantar fascia. Occasionally, corticosteroid injections are given to reduce inflammation. If symptoms persist for greater than 6 months despite non-surgical (conservative), then surgery may be recommended.  MEDICATION   If pain medication is necessary, then nonsteroidal anti-inflammatory medications, such as aspirin and ibuprofen, or other minor pain relievers, such as acetaminophen, are often recommended.  Do not take pain medication within 7 days before surgery.  Prescription pain relievers may be given if deemed necessary by your caregiver. Use only as directed and only as much as you need.  Corticosteroid injections may be given by your caregiver. These injections should be reserved for the most serious cases, because they may only be given a certain number of times. HEAT AND COLD  Cold treatment (icing) relieves pain and reduces inflammation. Cold treatment should be applied for 10 to 15 minutes every 2 to 3 hours for inflammation and pain and immediately after any activity that aggravates your symptoms. Use ice packs or massage the area with a piece of ice (ice massage).  Heat treatment may be used prior to performing the stretching and strengthening activities prescribed by your caregiver, physical therapist, or athletic trainer. Use a heat pack or soak the injury in warm water. SEEK IMMEDIATE MEDICAL CARE IF:  Treatment seems to offer no benefit, or the condition worsens.  Any medications produce adverse side effects. EXERCISES RANGE   OF MOTION (ROM) AND STRETCHING EXERCISES - Plantar Fasciitis (Heel Spur Syndrome) These exercises may help you  when beginning to rehabilitate your injury. Your symptoms may resolve with or without further involvement from your physician, physical therapist or athletic trainer. While completing these exercises, remember:   Restoring tissue flexibility helps normal motion to return to the joints. This allows healthier, less painful movement and activity.  An effective stretch should be held for at least 30 seconds.  A stretch should never be painful. You should only feel a gentle lengthening or release in the stretched tissue. RANGE OF MOTION - Toe Extension, Flexion  Sit with your right / left leg crossed over your opposite knee.  Grasp your toes and gently pull them back toward the top of your foot. You should feel a stretch on the bottom of your toes and/or foot.  Hold this stretch for __________ seconds.  Now, gently pull your toes toward the bottom of your foot. You should feel a stretch on the top of your toes and or foot.  Hold this stretch for __________ seconds. Repeat __________ times. Complete this stretch __________ times per day.  RANGE OF MOTION - Ankle Dorsiflexion, Active Assisted  Remove shoes and sit on a chair that is preferably not on a carpeted surface.  Place right / left foot under knee. Extend your opposite leg for support.  Keeping your heel down, slide your right / left foot back toward the chair until you feel a stretch at your ankle or calf. If you do not feel a stretch, slide your bottom forward to the edge of the chair, while still keeping your heel down.  Hold this stretch for __________ seconds. Repeat __________ times. Complete this stretch __________ times per day.  STRETCH - Gastroc, Standing  Place hands on wall.  Extend right / left leg, keeping the front knee somewhat bent.  Slightly point your toes inward on your back foot.  Keeping your right / left heel on the floor and your knee straight, shift your weight toward the wall, not allowing your back to  arch.  You should feel a gentle stretch in the right / left calf. Hold this position for __________ seconds. Repeat __________ times. Complete this stretch __________ times per day. STRETCH - Soleus, Standing  Place hands on wall.  Extend right / left leg, keeping the other knee somewhat bent.  Slightly point your toes inward on your back foot.  Keep your right / left heel on the floor, bend your back knee, and slightly shift your weight over the back leg so that you feel a gentle stretch deep in your back calf.  Hold this position for __________ seconds. Repeat __________ times. Complete this stretch __________ times per day. STRETCH - Gastrocsoleus, Standing  Note: This exercise can place a lot of stress on your foot and ankle. Please complete this exercise only if specifically instructed by your caregiver.   Place the ball of your right / left foot on a step, keeping your other foot firmly on the same step.  Hold on to the wall or a rail for balance.  Slowly lift your other foot, allowing your body weight to press your heel down over the edge of the step.  You should feel a stretch in your right / left calf.  Hold this position for __________ seconds.  Repeat this exercise with a slight bend in your right / left knee. Repeat __________ times. Complete this stretch __________ times per day.    STRENGTHENING EXERCISES - Plantar Fasciitis (Heel Spur Syndrome)  These exercises may help you when beginning to rehabilitate your injury. They may resolve your symptoms with or without further involvement from your physician, physical therapist or athletic trainer. While completing these exercises, remember:   Muscles can gain both the endurance and the strength needed for everyday activities through controlled exercises.  Complete these exercises as instructed by your physician, physical therapist or athletic trainer. Progress the resistance and repetitions only as guided. STRENGTH -  Towel Curls  Sit in a chair positioned on a non-carpeted surface.  Place your foot on a towel, keeping your heel on the floor.  Pull the towel toward your heel by only curling your toes. Keep your heel on the floor.  If instructed by your physician, physical therapist or athletic trainer, add ____________________ at the end of the towel. Repeat __________ times. Complete this exercise __________ times per day. STRENGTH - Ankle Inversion  Secure one end of a rubber exercise band/tubing to a fixed object (table, pole). Loop the other end around your foot just before your toes.  Place your fists between your knees. This will focus your strengthening at your ankle.  Slowly, pull your big toe up and in, making sure the band/tubing is positioned to resist the entire motion.  Hold this position for __________ seconds.  Have your muscles resist the band/tubing as it slowly pulls your foot back to the starting position. Repeat __________ times. Complete this exercises __________ times per day.  Document Released: 12/21/2004 Document Revised: 03/15/2011 Document Reviewed: 04/04/2008 [employer] Patient Information 2015 [employer]. This information is not intended to replace advice given to you by your health care provider. Make sure you discuss any questions you have with your health care provider.  

## 2013-12-24 ENCOUNTER — Telehealth: Payer: Self-pay | Admitting: *Deleted

## 2013-12-24 NOTE — Telephone Encounter (Signed)
Pt states she is having severe pain in her heel worse since the injection on Wednesday 12/19/2013.  Pt wants to know what else to do.

## 2013-12-26 ENCOUNTER — Ambulatory Visit (INDEPENDENT_AMBULATORY_CARE_PROVIDER_SITE_OTHER): Payer: Medicare Other | Admitting: Podiatry

## 2013-12-26 ENCOUNTER — Encounter: Payer: Self-pay | Admitting: Podiatry

## 2013-12-26 VITALS — BP 152/80 | HR 72 | Resp 12

## 2013-12-26 DIAGNOSIS — M722 Plantar fascial fibromatosis: Secondary | ICD-10-CM

## 2013-12-26 NOTE — Patient Instructions (Signed)
Wear fasciitis strap on left foot except when sleeping  Plantar Fasciitis Plantar fasciitis is a common condition that causes foot pain. It is soreness (inflammation) of the band of tough fibrous tissue on the bottom of the foot that runs from the heel bone (calcaneus) to the ball of the foot. The cause of this soreness may be from excessive standing, poor fitting shoes, running on hard surfaces, being overweight, having an abnormal walk, or overuse (this is common in runners) of the painful foot or feet. It is also common in aerobic exercise dancers and ballet dancers. SYMPTOMS  Most people with plantar fasciitis complain of:  Severe pain in the morning on the bottom of their foot especially when taking the first steps out of bed. This pain recedes after a few minutes of walking.  Severe pain is experienced also during walking following a long period of inactivity.  Pain is worse when walking barefoot or up stairs DIAGNOSIS   Your caregiver will diagnose this condition by examining and feeling your foot.  Special tests such as X-rays of your foot, are usually not needed. PREVENTION   Consult a sports medicine professional before beginning a new exercise program.  Walking programs offer a good workout. With walking there is a lower chance of overuse injuries common to runners. There is less impact and less jarring of the joints.  Begin all new exercise programs slowly. If problems or pain develop, decrease the amount of time or distance until you are at a comfortable level.  Wear good shoes and replace them regularly.  Stretch your foot and the heel cords at the back of the ankle (Achilles tendon) both before and after exercise.  Run or exercise on even surfaces that are not hard. For example, asphalt is better than pavement.  Do not run barefoot on hard surfaces.  If using a treadmill, vary the incline.  Do not continue to workout if you have foot or joint problems. Seek  professional help if they do not improve. HOME CARE INSTRUCTIONS   Avoid activities that cause you pain until you recover.  Use ice or cold packs on the problem or painful areas after working out.  Only take over-the-counter or prescription medicines for pain, discomfort, or fever as directed by your caregiver.  Soft shoe inserts or athletic shoes with air or gel sole cushions may be helpful.  If problems continue or become more severe, consult a sports medicine caregiver or your own health care provider. Cortisone is a potent anti-inflammatory medication that may be injected into the painful area. You can discuss this treatment with your caregiver. MAKE SURE YOU:   Understand these instructions.  Will watch your condition.  Will get help right away if you are not doing well or get worse. Document Released: 09/15/2000 Document Revised: 03/15/2011 Document Reviewed: 11/15/2007 [facility] Patient Information 2015 [place], [place]. This information is not intended to replace advice given to you by your health care provider. Make sure you discuss any questions you have with your health care provider.

## 2013-12-26 NOTE — Telephone Encounter (Signed)
Pt his given appt, seen 12/26/2013.

## 2013-12-27 DIAGNOSIS — M722 Plantar fascial fibromatosis: Secondary | ICD-10-CM

## 2013-12-27 MED ORDER — TRIAMCINOLONE ACETONIDE 10 MG/ML IJ SUSP
10.0000 mg | Freq: Once | INTRAMUSCULAR | Status: AC
  Administered 2013-12-27: 10 mg

## 2013-12-27 NOTE — Progress Notes (Signed)
Patient ID: [name], female   DOB: 1940-07-11, 73 y.o.   MRN: [id number]  Subjective: This patient presents again after the visit of 12/19/2013 still complaining of inferior left heel pain aggravated with standing and walking relieved with rest. She had a Kenalog Injection on the visit of 12/19/2013 had slight temporary relief, however, presented to urgent care for left heel pain and was given a prescription for pain medication. She took several doses of the Percocet and did not like the way she felt when she took the medication. She's not taking any more Percocet than the initial several doses.  Objective: Orientated 3  Vascular: DP pulses 2/4 bilaterally PT pulses 1/4 bilaterally Capillary reflex immediate bilaterally  Neurological: Sensation to 10 g monofilament wire intact 4/5 bilaterally Vibratory sensation nonreactive bilaterally Ankle reflex reactive bilaterally  Musculoskeletal: Palpable tenderness medial plantar fascial insertional area left which duplicates area primary discomfort Mild palpable tenderness posterior left heel at the insertional area without a palpable lesions There is no edema, erythema, warmth noted in the left heel area Plantar flexion 5/ 5 left manual Patient has a limping gait favoring left foot  Assessment: Persistence of plantar fasciitis left Low-grade Achilles tendinitis left  Plan: Patient is patient is complaining great deal pain with walking I offered her another repeat Kenalog injection make him aware that her blood glucose could elevate. She verbally consents  The skin is prepped with alcohol and Betadine and 10 mg of Kenalog mixed with 10 mg of plain Xylocaine and 2.5 mg of plain Marcaine injected inferior heel left for Kenalog injection #2 .patient tolerated procedure without any difficulty  Dispense plantar fasciitis strap to wear and left foot  Continue stretching

## 2014-01-09 ENCOUNTER — Ambulatory Visit: Payer: Medicare Other | Admitting: Podiatry

## 2014-02-15 ENCOUNTER — Other Ambulatory Visit: Payer: Self-pay | Admitting: Family Medicine

## 2014-02-15 DIAGNOSIS — M8580 Other specified disorders of bone density and structure, unspecified site: Secondary | ICD-10-CM

## 2014-02-15 DIAGNOSIS — Z1231 Encounter for screening mammogram for malignant neoplasm of breast: Secondary | ICD-10-CM

## 2014-03-13 ENCOUNTER — Ambulatory Visit
Admission: RE | Admit: 2014-03-13 | Discharge: 2014-03-13 | Disposition: A | Discharge status: Home / Self Care | Payer: Medicare Other | Source: Ambulatory Visit | Attending: Family Medicine | Admitting: Family Medicine

## 2014-03-13 DIAGNOSIS — M8580 Other specified disorders of bone density and structure, unspecified site: Secondary | ICD-10-CM

## 2014-03-13 DIAGNOSIS — Z1231 Encounter for screening mammogram for malignant neoplasm of breast: Secondary | ICD-10-CM

## 2014-04-17 ENCOUNTER — Ambulatory Visit: Payer: Medicare Other | Admitting: Infectious Disease

## 2014-05-29 ENCOUNTER — Emergency Department (HOSPITAL_COMMUNITY): Payer: Medicare Other

## 2014-05-29 ENCOUNTER — Encounter (HOSPITAL_COMMUNITY): Payer: Self-pay

## 2014-05-29 ENCOUNTER — Emergency Department (HOSPITAL_COMMUNITY)
Admission: EM | Admit: 2014-05-29 | Discharge: 2014-05-30 | Disposition: A | Discharge status: Home / Self Care | Payer: Medicare Other | Attending: Emergency Medicine | Admitting: Emergency Medicine

## 2014-05-29 DIAGNOSIS — S8991XA Unspecified injury of right lower leg, initial encounter: Secondary | ICD-10-CM | POA: Insufficient documentation

## 2014-05-29 DIAGNOSIS — E785 Hyperlipidemia, unspecified: Secondary | ICD-10-CM | POA: Insufficient documentation

## 2014-05-29 DIAGNOSIS — Z8673 Personal history of transient ischemic attack (TIA), and cerebral infarction without residual deficits: Secondary | ICD-10-CM | POA: Insufficient documentation

## 2014-05-29 DIAGNOSIS — Y939 Activity, unspecified: Secondary | ICD-10-CM | POA: Insufficient documentation

## 2014-05-29 DIAGNOSIS — E119 Type 2 diabetes mellitus without complications: Secondary | ICD-10-CM | POA: Insufficient documentation

## 2014-05-29 DIAGNOSIS — I10 Essential (primary) hypertension: Secondary | ICD-10-CM | POA: Diagnosis not present

## 2014-05-29 DIAGNOSIS — W1839XA Other fall on same level, initial encounter: Secondary | ICD-10-CM | POA: Insufficient documentation

## 2014-05-29 DIAGNOSIS — D649 Anemia, unspecified: Secondary | ICD-10-CM | POA: Diagnosis not present

## 2014-05-29 DIAGNOSIS — Y999 Unspecified external cause status: Secondary | ICD-10-CM | POA: Insufficient documentation

## 2014-05-29 DIAGNOSIS — Y929 Unspecified place or not applicable: Secondary | ICD-10-CM | POA: Diagnosis not present

## 2014-05-29 DIAGNOSIS — T148 Other injury of unspecified body region: Secondary | ICD-10-CM | POA: Insufficient documentation

## 2014-05-29 DIAGNOSIS — S3991XA Unspecified injury of abdomen, initial encounter: Secondary | ICD-10-CM | POA: Diagnosis not present

## 2014-05-29 DIAGNOSIS — T148XXA Other injury of unspecified body region, initial encounter: Secondary | ICD-10-CM

## 2014-05-29 DIAGNOSIS — Z85850 Personal history of malignant neoplasm of thyroid: Secondary | ICD-10-CM | POA: Diagnosis not present

## 2014-05-29 DIAGNOSIS — S29001A Unspecified injury of muscle and tendon of front wall of thorax, initial encounter: Secondary | ICD-10-CM | POA: Diagnosis present

## 2014-05-29 DIAGNOSIS — Z79899 Other long term (current) drug therapy: Secondary | ICD-10-CM | POA: Diagnosis not present

## 2014-05-29 DIAGNOSIS — Z87891 Personal history of nicotine dependence: Secondary | ICD-10-CM | POA: Insufficient documentation

## 2014-05-29 DIAGNOSIS — W19XXXA Unspecified fall, initial encounter: Secondary | ICD-10-CM

## 2014-05-29 DIAGNOSIS — J45909 Unspecified asthma, uncomplicated: Secondary | ICD-10-CM | POA: Diagnosis not present

## 2014-05-29 DIAGNOSIS — S79911A Unspecified injury of right hip, initial encounter: Secondary | ICD-10-CM | POA: Insufficient documentation

## 2014-05-29 DIAGNOSIS — Z8719 Personal history of other diseases of the digestive system: Secondary | ICD-10-CM | POA: Insufficient documentation

## 2014-05-29 DIAGNOSIS — S3992XA Unspecified injury of lower back, initial encounter: Secondary | ICD-10-CM | POA: Diagnosis not present

## 2014-05-29 LAB — CBC WITH DIFFERENTIAL/PLATELET
BASOS PCT: 0 % (ref 0–1)
Basophils Absolute: 0 10*3/uL (ref 0.0–0.1)
Eosinophils Absolute: 0.2 10*3/uL (ref 0.0–0.7)
Eosinophils Relative: 1 % (ref 0–5)
HEMATOCRIT: 30.4 % — AB (ref 36.0–46.0)
HEMOGLOBIN: 9.6 g/dL — AB (ref 12.0–15.0)
LYMPHS PCT: 10 % — AB (ref 12–46)
Lymphs Abs: 1.6 10*3/uL (ref 0.7–4.0)
MCH: 29 pg (ref 26.0–34.0)
MCHC: 31.6 g/dL (ref 30.0–36.0)
MCV: 91.8 fL (ref 78.0–100.0)
Monocytes Absolute: 1.1 10*3/uL — ABNORMAL HIGH (ref 0.1–1.0)
Monocytes Relative: 7 % (ref 3–12)
NEUTROS PCT: 82 % — AB (ref 43–77)
Neutro Abs: 13.5 10*3/uL — ABNORMAL HIGH (ref 1.7–7.7)
Platelets: 282 10*3/uL (ref 150–400)
RBC: 3.31 MIL/uL — ABNORMAL LOW (ref 3.87–5.11)
RDW: 14 % (ref 11.5–15.5)
WBC: 16.5 10*3/uL — ABNORMAL HIGH (ref 4.0–10.5)

## 2014-05-29 MED ORDER — HYDROMORPHONE HCL 1 MG/ML IJ SOLN
1.0000 mg | Freq: Once | INTRAMUSCULAR | Status: AC
  Administered 2014-05-29: 1 mg via INTRAMUSCULAR
  Filled 2014-05-29: qty 1

## 2014-05-29 NOTE — ED Provider Notes (Signed)
CSN: [id number]     Arrival date & time 05/29/14  2226 History   First MD Initiated Contact with Patient 05/29/14 2257     Chief Complaint  Patient presents with  . Fall     (Consider location/radiation/quality/duration/timing/severity/associated sxs/prior Treatment) HPI Comments: 74 y/o with comes in post fall. She is on aggrenox, no anticoagulants. Pt had a mechanical fall around 6 or 6:30 pm, when she was startled by a dog and fell backwards. Pt was helped up by the employees, but her pain persisted and she was unable to ambulate normally and so she decided to come to the ER. Pt doesn't think she hit her head, she has no nausea, headaches, visual complains. Pt has pain in her entire right side - shoulder, back, hip and knee. Pt has some pain with inspiration. No dizziness, no near syncope. Denies associated numbness, weakness, urinary incontinence, urinary retention, bowel incontinence, saddle anesthesia.   ROS 10 Systems reviewed and are negative for acute change except as noted in the HPI.       Patient is a 74 y.o. female presenting with fall. The history is provided by the patient.  Fall    Past Medical History  Diagnosis Date  . Diabetes mellitus   . Hyperlipidemia   . Hypertension   . History of transient ischemic attack (TIA)   . Thyroid carcinoma   . GERD (gastroesophageal reflux disease)   . OA (osteoarthritis)   . Asthma   . Anemia   . Chest pain, atypical    Past Surgical History  Procedure Laterality Date  . Cardiac catheterization  03/18/2009    NORMAL LEFT VENTRICULAR SIZE AND CONTRACTILITY WITH NORMAL SYSTOLIC  FUNCTION. EF 60%  . Cardiolite study      SHOWED A SIGNIFICANT REVERSIBLE ANTERIOR WALL DEFECT CONSISTENT WITH ISCHEMIA. EF 55%  . Thyroidectomy    . Back surgery    . Lumbar fusion    . Total knee arthroplasty      right   Family History  Problem Relation Age of Onset  . Pneumonia Mother   . Heart attack Father    History  Substance  Use Topics  . Smoking status: Former Research scientist (life sciences)  . Smokeless tobacco: Never Used  . Alcohol Use: No   OB History    No data available     Review of Systems  Musculoskeletal: Positive for myalgias and arthralgias.  Hematological: Does not bruise/bleed easily.  All other systems reviewed and are negative.     Allergies  Actos; Ativan; Cephalexin; and Rosiglitazone  Home Medications   Prior to Admission medications   Medication Sig Start Date End Date Taking? Authorizing Provider  acetaminophen (TYLENOL) 500 MG tablet Take 1,000 mg by mouth every 6 (six) hours as needed for mild pain.   Yes Historical Provider, MD  alendronate (FOSAMAX) 70 MG tablet Take 70 mg by mouth every 7 (seven) days. Monday. Take with a full glass of water on an empty stomach.   Yes Historical Provider, MD  amLODipine (NORVASC) 10 MG tablet Take 10 mg by mouth daily.   Yes Historical Provider, MD  atorvastatin (LIPITOR) 20 MG tablet Take 20 mg by mouth daily.   Yes Historical Provider, MD  calcitRIOL (ROCALTROL) 0.25 MCG capsule Take 0.5 mcg by mouth daily.   Yes Historical Provider, MD  calcium-vitamin D (OSCAL WITH D) 500-200 MG-UNIT per tablet Take 1 tablet by mouth daily with breakfast.   Yes Historical Provider, MD  colchicine 0.6 MG tablet Take  1 tablet (0.6 mg total) by mouth as directed. 10/17/13  Yes Truman Hayward, MD  dipyridamole-aspirin (AGGRENOX) 25-200 MG per 12 hr capsule Take 1 capsule by mouth 2 (two) times daily.    Yes Historical Provider, MD  ferrous sulfate 325 (65 FE) MG EC tablet Take 325 mg by mouth daily.   Yes Historical Provider, MD  hydrochlorothiazide 25 MG tablet Take 25 mg by mouth daily.     Yes Historical Provider, MD  HYDROcodone-acetaminophen (NORCO/VICODIN) 5-325 MG per tablet  11/23/13  Yes Historical Provider, MD  levothyroxine (SYNTHROID, LEVOTHROID) 137 MCG tablet Take 137 mcg by mouth daily before breakfast.   Yes Historical Provider, MD  lisinopril (PRINIVIL,ZESTRIL)  40 MG tablet Take 40 mg by mouth 2 (two) times daily.     Yes Historical Provider, MD  metFORMIN (GLUCOPHAGE) 1000 MG tablet Take 1,000 mg by mouth 2 (two) times daily.   Yes Historical Provider, MD  pregabalin (LYRICA) 300 MG capsule Take 300 mg by mouth 2 (two) times daily. 04/29/14  Yes Historical Provider, MD  meclizine (ANTIVERT) 25 MG tablet Take 1 tablet (25 mg total) by mouth 3 (three) times daily as needed for dizziness. 07/05/12   Jennifer Piepenbrink, PA-C  naproxen (NAPROSYN) 500 MG tablet Take 1 tablet (500 mg total) by mouth 2 (two) times daily with a meal. Patient not taking: Reported on 05/29/2014 06/27/12   Noemi Chapel, MD  oxyCODONE-acetaminophen (PERCOCET/ROXICET) 5-325 MG per tablet Take 1 tablet by mouth every 6 (six) hours as needed for severe pain. 05/30/14   Tracie Lindbloom Kathrynn Humble, MD   BP 128/51 mmHg  Pulse 70  Temp(Src) 98 F (36.7 C) (Oral)  Resp 16  Ht 5\' 4"  (1.626 m)  Wt 248 lb (112.492 kg)  BMI 42.55 kg/m2  SpO2 99% Physical Exam  Constitutional: She is oriented to person, place, and time. She appears well-developed and well-nourished.  HENT:  Head: Normocephalic and atraumatic.  Eyes: EOM are normal. Pupils are equal, round, and reactive to light.  Neck: Neck supple.  No midline c-spine tenderness  Cardiovascular: Normal rate and regular rhythm.   No murmur heard. Pulmonary/Chest: Effort normal and breath sounds normal. No respiratory distress. She has no wheezes. She exhibits tenderness.  Right flank tenderness  Abdominal: Soft. Bowel sounds are normal. She exhibits no distension. There is no tenderness.  Musculoskeletal:   no pelvic pain, instability. Pt has R hip, femur tenderness. No gross deformities. Also has posterior knee tenderness  Neurological: She is alert and oriented to person, place, and time. No cranial nerve deficit.  Skin: Skin is warm and dry. No rash noted.  Nursing note and vitals reviewed.   ED Course  Procedures (including critical care  time) Labs Review Labs Reviewed  CBC WITH DIFFERENTIAL/PLATELET - Abnormal; Notable for the following:    WBC 16.5 (*)    RBC 3.31 (*)    Hemoglobin 9.6 (*)    HCT 30.4 (*)    Neutrophils Relative % 82 (*)    Neutro Abs 13.5 (*)    Lymphocytes Relative 10 (*)    Monocytes Absolute 1.1 (*)    All other components within normal limits  BASIC METABOLIC PANEL - Abnormal; Notable for the following:    Glucose, Bld 143 (*)    BUN 30 (*)    Creatinine, Ser 1.09 (*)    Calcium 7.5 (*)    GFR calc non Af Amer 48 (*)    GFR calc Af Amer 56 (*)  All other components within normal limits  URINE CULTURE  URINALYSIS, ROUTINE W REFLEX MICROSCOPIC (NOT AT [facility])    Imaging Review Dg Ribs Unilateral W/chest Right  05/30/2014   CLINICAL DATA:  Post fall, now with right lower posterior rib pain  EXAM: RIGHT RIBS AND CHEST - 3+ VIEW  COMPARISON:  Chest radiograph - 08/13/2010  FINDINGS: Grossly unchanged enlarged cardiac silhouette and mediastinal contours with atherosclerotic plaque within the thoracic aorta. Pulmonary venous congestion without frank evidence of edema. No focal airspace opacities. No pleural effusion or pneumothorax  No definite displaced right-sided rib fractures with special attention paid to the area demarcated by the radiopaque BB.  Surgical clips overlie the thoracic inlet.  Post lower lumbar paraspinal fusion, incompletely evaluated.  IMPRESSION: No acute cardiopulmonary disease. Specifically, no definite displaced right-sided rib fractures with special attention paid to the area demarcated by the radiopaque BB.   Electronically Signed   By: Sandi Mariscal M.D.   On: 05/30/2014 00:54   Dg Hip Unilat  With Pelvis 2-3 Views Right  05/29/2014   CLINICAL DATA:  Post fall, now with generalized right hip pain.  EXAM: RIGHT HIP (WITH PELVIS) 2-3 VIEWS  COMPARISON:  None.  FINDINGS: No fracture or dislocation. Mild degenerative change of the right hip with joint space loss, subchondral  sclerosis and osteophytosis. No evidence of avascular necrosis.  Limited visualization of the adjacent pelvis demonstrates advanced degenerative change of the pubic symphysis. Minimal enthesopathic change involving the contralateral left greater trochanter. Post paraspinal fusion of the lower lumbar spine, incompletely evaluated. Metallic sutures overlie the lower pelvis. Bilateral subcutaneous gluteal calcifications.  IMPRESSION: 1. No acute findings. 2. Mild degenerative change of the right hip.   Electronically Signed   By: Sandi Mariscal M.D.   On: 05/29/2014 23:43     EKG Interpretation None      @2 :45 am - pt has ambulated. She still has some R sided discomfort with ambulation, but able to bear weight. Will d/c with analgesics. Return precautions discussed, especially if pain gets worse and she cannot ambulate.   MDM   Final diagnoses:  Fall, initial encounter  Contusion    DDx includes: - Mechanical falls - ICH - Fractures - Contusions - Soft tissue injury   Pt comes in with cc of fall. Pt fell on to her R side. Fall was at 6 pm - she has no headaches, or any neuro complains or deficits - brain and neck both cleared clinically.  Pt has some hip and femur region pain. Popliteal region hurts minimally as well and she has pleuritic right back pain. Appropriate imaging ordered.   Varney Biles, MD 05/30/14 [phone]

## 2014-05-29 NOTE — ED Notes (Signed)
Patient transported to X-ray 

## 2014-05-29 NOTE — ED Notes (Signed)
Pt reports being in the common area at her assisted living home then being startled by a dog then subsequently falling backwards. Pt reports right sided flank, hip, knee pain.

## 2014-05-29 NOTE — ED Notes (Signed)
Bed: WA01 Expected date:  Expected time:  Means of arrival:  Comments: EMS 74 yo fall

## 2014-05-30 LAB — BASIC METABOLIC PANEL
Anion gap: 12 (ref 5–15)
BUN: 30 mg/dL — ABNORMAL HIGH (ref 6–20)
CO2: 22 mmol/L (ref 22–32)
CREATININE: 1.09 mg/dL — AB (ref 0.44–1.00)
Calcium: 7.5 mg/dL — ABNORMAL LOW (ref 8.9–10.3)
Chloride: 106 mmol/L (ref 101–111)
GFR calc non Af Amer: 48 mL/min — ABNORMAL LOW (ref >60)
GFR, EST AFRICAN AMERICAN: 56 mL/min — AB (ref >60)
Glucose, Bld: 143 mg/dL — ABNORMAL HIGH (ref 65–99)
POTASSIUM: 4.3 mmol/L (ref 3.5–5.1)
Sodium: 140 mmol/L (ref 135–145)

## 2014-05-30 LAB — URINALYSIS, ROUTINE W REFLEX MICROSCOPIC
Bilirubin Urine: NEGATIVE
Glucose, UA: NEGATIVE mg/dL
Hgb urine dipstick: NEGATIVE
Ketones, ur: NEGATIVE mg/dL
Leukocytes, UA: NEGATIVE
Nitrite: NEGATIVE
PH: 5.5 (ref 5.0–8.0)
Protein, ur: NEGATIVE mg/dL
Specific Gravity, Urine: 1.015 (ref 1.005–1.030)
Urobilinogen, UA: 0.2 mg/dL (ref 0.0–1.0)

## 2014-05-30 MED ORDER — OXYCODONE-ACETAMINOPHEN 5-325 MG PO TABS: 1.0000 | ORAL_TABLET | Freq: Four times a day (QID) | ORAL | Status: DC | PRN

## 2014-05-30 MED ORDER — OXYCODONE-ACETAMINOPHEN 5-325 MG PO TABS
1.0000 | ORAL_TABLET | Freq: Once | ORAL | Status: AC
Start: 2014-05-30 — End: 2014-05-30
  Administered 2014-05-30: 1 via ORAL
  Filled 2014-05-30: qty 1

## 2014-05-30 NOTE — Discharge Instructions (Signed)
We saw you in the ER after you had a fall. All the imaging results are normal, no fractures seen.  Please be very careful with walking, and do everything possible to prevent falls.  IF PAIN GETS WORSE AND YOU ARE UNABLE TO WALK, RETURN TO THE ER, OTHERWISE SEE YOUR DOCTOR IN 2 WEEKS. USE RICE TREATMENT (READ BELOW).  RICE: Routine Care for Injuries The routine care of many injuries includes Rest, Ice, Compression, and Elevation (RICE). HOME CARE INSTRUCTIONS  Rest is needed to allow your body to heal. Routine activities can usually be resumed when comfortable. Injured tendons and bones can take up to 6 weeks to heal. Tendons are the cord-like structures that attach muscle to bone.  Ice following an injury helps keep the swelling down and reduces pain.  Put ice in a plastic bag.  Place a towel between your skin and the bag.  Leave the ice on for 15-20 minutes, 3-4 times a day, or as directed by your health care provider. Do this while awake, for the first 24 to 48 hours. After that, continue as directed by your caregiver.  Compression helps keep swelling down. It also gives support and helps with discomfort. If an elastic bandage has been applied, it should be removed and reapplied every 3 to 4 hours. It should not be applied tightly, but firmly enough to keep swelling down. Watch fingers or toes for swelling, bluish discoloration, coldness, numbness, or excessive pain. If any of these problems occur, remove the bandage and reapply loosely. Contact your caregiver if these problems continue.  Elevation helps reduce swelling and decreases pain. With extremities, such as the arms, hands, legs, and feet, the injured area should be placed near or above the level of the heart, if possible. SEEK IMMEDIATE MEDICAL CARE IF:  You have persistent pain and swelling.  You develop redness, numbness, or unexpected weakness.  Your symptoms are getting worse rather than improving after several  days. These symptoms may indicate that further evaluation or further X-rays are needed. Sometimes, X-rays may not show a small broken bone (fracture) until 1 week or 10 days later. Make a follow-up appointment with your caregiver. Ask when your X-ray results will be ready. Make sure you get your X-ray results. Document Released: 04/04/2000 Document Revised: 12/26/2012 Document Reviewed: 05/22/2010 [facility] Patient Information 2015 [place], [place]. This information is not intended to replace advice given to you by your health care provider. Make sure you discuss any questions you have with your health care provider.    Contusion A contusion is a deep bruise. Contusions are the result of an injury that caused bleeding under the skin. The contusion may turn blue, purple, or yellow. Minor injuries will give you a painless contusion, but more severe contusions may stay painful and swollen for a few weeks.  CAUSES  A contusion is usually caused by a blow, trauma, or direct force to an area of the body. SYMPTOMS   Swelling and redness of the injured area.  Bruising of the injured area.  Tenderness and soreness of the injured area.  Pain. DIAGNOSIS  The diagnosis can be made by taking a history and physical exam. An X-ray, CT scan, or MRI may be needed to determine if there were any associated injuries, such as fractures. TREATMENT  Specific treatment will depend on what area of the body was injured. In general, the best treatment for a contusion is resting, icing, elevating, and applying cold compresses to the injured area. Over-the-counter medicines  may also be recommended for pain control. Ask your caregiver what the best treatment is for your contusion. HOME CARE INSTRUCTIONS   Put ice on the injured area.  Put ice in a plastic bag.  Place a towel between your skin and the bag.  Leave the ice on for 15-20 minutes, 3-4 times a day, or as directed by your health care provider.  Only take  over-the-counter or prescription medicines for pain, discomfort, or fever as directed by your caregiver. Your caregiver may recommend avoiding anti-inflammatory medicines (aspirin, ibuprofen, and naproxen) for 48 hours because these medicines may increase bruising.  Rest the injured area.  If possible, elevate the injured area to reduce swelling. SEEK IMMEDIATE MEDICAL CARE IF:   You have increased bruising or swelling.  You have pain that is getting worse.  Your swelling or pain is not relieved with medicines. MAKE SURE YOU:   Understand these instructions.  Will watch your condition.  Will get help right away if you are not doing well or get worse. Document Released: 09/30/2004 Document Revised: 12/26/2012 Document Reviewed: 10/26/2010 [facility] Patient Information 2015 [place], [place]. This information is not intended to replace advice given to you by your health care provider. Make sure you discuss any questions you have with your health care provider.  Rib Contusion A rib contusion (bruise) can occur by a blow to the chest or by a fall against a hard object. Usually these will be much better in a couple weeks. If X-rays were taken today and there are no broken bones (fractures), the diagnosis of bruising is made. However, broken ribs may not show up for several days, or may be discovered later on a routine X-ray when signs of healing show up. If this happens to you, it does not mean that something was missed on the X-ray, but simply that it did not show up on the first X-rays. Earlier diagnosis will not usually change the treatment. HOME CARE INSTRUCTIONS   Avoid strenuous activity. Be careful during activities and avoid bumping the injured ribs. Activities that pull on the injured ribs and cause pain should be avoided, if possible.  For the first day or two, an ice pack used every 20 minutes while awake may be helpful. Put ice in a plastic bag and put a towel between the bag and the  skin.  Eat a normal, well-balanced diet. Drink plenty of fluids to avoid constipation.  Take deep breaths several times a day to keep lungs free of infection. Try to cough several times a day. Splint the injured area with a pillow while coughing to ease pain. Coughing can help prevent pneumonia.  Wear a rib belt or binder only if told to do so by your caregiver. If you are wearing a rib belt or binder, you must do the breathing exercises as directed by your caregiver. If not used properly, rib belts or binders restrict breathing which can lead to pneumonia.  Only take over-the-counter or prescription medicines for pain, discomfort, or fever as directed by your caregiver. SEEK MEDICAL CARE IF:   You or your child has an oral temperature above 102 F (38.9 C).  Your baby is older than 3 months with a rectal temperature of 100.5 F (38.1 C) or higher for more than 1 day.  You develop a cough, with thick or bloody sputum. SEEK IMMEDIATE MEDICAL CARE IF:   You have difficulty breathing.  You feel sick to your stomach (nausea), have vomiting or belly (abdominal) pain.  You have worsening pain, not controlled with medications, or there is a change in the location of the pain.  You develop sweating or radiation of the pain into the arms, jaw or shoulders, or become light headed or faint.  You or your child has an oral temperature above 102 F (38.9 C), not controlled by medicine.  Your or your baby is older than 3 months with a rectal temperature of 102 F (38.9 C) or higher.  Your baby is 66 months old or younger with a rectal temperature of 100.4 F (38 C) or higher. MAKE SURE YOU:   Understand these instructions.  Will watch your condition.  Will get help right away if you are not doing well or get worse. Document Released: 09/15/2000 Document Revised: 04/17/2012 Document Reviewed: 08/09/2007 [facility] Patient Information 2015 [place], [place]. This information is not intended to  replace advice given to you by your health care provider. Make sure you discuss any questions you have with your health care provider.

## 2014-05-31 LAB — URINE CULTURE

## 2014-06-22 ENCOUNTER — Emergency Department (HOSPITAL_COMMUNITY): Payer: Medicare Other

## 2014-06-22 ENCOUNTER — Emergency Department (HOSPITAL_COMMUNITY)
Admission: EM | Admit: 2014-06-22 | Discharge: 2014-06-22 | Disposition: A | Discharge status: Home / Self Care | Payer: Medicare Other | Attending: Emergency Medicine | Admitting: Emergency Medicine

## 2014-06-22 ENCOUNTER — Encounter (HOSPITAL_COMMUNITY): Payer: Self-pay | Admitting: Emergency Medicine

## 2014-06-22 DIAGNOSIS — I10 Essential (primary) hypertension: Secondary | ICD-10-CM | POA: Insufficient documentation

## 2014-06-22 DIAGNOSIS — Z7982 Long term (current) use of aspirin: Secondary | ICD-10-CM | POA: Insufficient documentation

## 2014-06-22 DIAGNOSIS — M25511 Pain in right shoulder: Secondary | ICD-10-CM | POA: Insufficient documentation

## 2014-06-22 DIAGNOSIS — G8929 Other chronic pain: Secondary | ICD-10-CM | POA: Diagnosis not present

## 2014-06-22 DIAGNOSIS — J45909 Unspecified asthma, uncomplicated: Secondary | ICD-10-CM | POA: Insufficient documentation

## 2014-06-22 DIAGNOSIS — E119 Type 2 diabetes mellitus without complications: Secondary | ICD-10-CM | POA: Insufficient documentation

## 2014-06-22 DIAGNOSIS — Z8673 Personal history of transient ischemic attack (TIA), and cerebral infarction without residual deficits: Secondary | ICD-10-CM | POA: Insufficient documentation

## 2014-06-22 DIAGNOSIS — Z79899 Other long term (current) drug therapy: Secondary | ICD-10-CM | POA: Diagnosis not present

## 2014-06-22 DIAGNOSIS — Z87891 Personal history of nicotine dependence: Secondary | ICD-10-CM | POA: Diagnosis not present

## 2014-06-22 DIAGNOSIS — E785 Hyperlipidemia, unspecified: Secondary | ICD-10-CM | POA: Insufficient documentation

## 2014-06-22 DIAGNOSIS — Z8719 Personal history of other diseases of the digestive system: Secondary | ICD-10-CM | POA: Diagnosis not present

## 2014-06-22 DIAGNOSIS — D649 Anemia, unspecified: Secondary | ICD-10-CM | POA: Insufficient documentation

## 2014-06-22 DIAGNOSIS — Z85850 Personal history of malignant neoplasm of thyroid: Secondary | ICD-10-CM | POA: Insufficient documentation

## 2014-06-22 DIAGNOSIS — Z9889 Other specified postprocedural states: Secondary | ICD-10-CM | POA: Diagnosis not present

## 2014-06-22 DIAGNOSIS — M1990 Unspecified osteoarthritis, unspecified site: Secondary | ICD-10-CM | POA: Diagnosis not present

## 2014-06-22 MED ORDER — HYDROMORPHONE HCL 1 MG/ML IJ SOLN
1.0000 mg | Freq: Once | INTRAMUSCULAR | Status: AC
  Administered 2014-06-22: 1 mg via INTRAVENOUS
  Filled 2014-06-22: qty 1

## 2014-06-22 MED ORDER — HYDROMORPHONE HCL 2 MG PO TABS: 2.0000 mg | ORAL_TABLET | ORAL | Status: DC | PRN

## 2014-06-22 MED ORDER — HYDROMORPHONE HCL 2 MG PO TABS: 2.0000 mg | ORAL_TABLET | Freq: Four times a day (QID) | ORAL | Status: DC | PRN

## 2014-06-22 NOTE — ED Notes (Signed)
Pt with fall on 5/26 and was treated in ED and f/u with MD office / ortho several times. States her pain on R side from shoulder to R LLE is increasing. Pt with limited mobility and usually uses a walker, but now pain has increased an unable to walk with walker.

## 2014-06-22 NOTE — ED Notes (Signed)
Bed: [id number] Expected date:  Expected time:  Means of arrival:  Comments: 74 y/o rt sided pain from fall in may

## 2014-06-22 NOTE — Discharge Instructions (Signed)
Thank you for allowing [place] to be involved in your healthcare while you were hospitalized at [facility].   Please note that there have been changes to your home medications.  --> PLEASE LOOK AT YOUR DISCHARGE MEDICATION LIST FOR DETAILS.  Please call your PCP if you have any questions or concerns, or any difficulty getting any of your medications.  Please return to the ER if you have worsening of your symptoms or new severe symptoms arise.   TAKE DILUADID 1 PILL EVERY 6 HOURS AS NEEDED FOR PAIN. DO NOT TAKE THIS IN COMBINATION WITH YOUR HYDROCODONE (NORCO/VICODIN). FOLLOW UP WITH PRIMARY CARE DOCTOR AND ORTHOPEDIC SURGEON.   Adhesive Capsulitis Sometimes the shoulder becomes stiff and is painful to move. Some people say it feels as if the shoulder is frozen in place. Because of this, the condition is called "frozen shoulder." Its medical name is adhesive capsulitis.  The shoulder joint is made up of strong connective tissue that attaches the ball of the humerus to the shallow shoulder socket. This strong connective tissue is called the joint capsule. This tissue can become stiff and swollen. That is when adhesive capsulitis sets in. CAUSES  It is not always clear just what the cause adhesive capsulitis. Possibilities include:  Injury to the shoulder joint.  Strain. This is a repetitive injury brought about by overuse.  Lack of use. Perhaps your arm or hand was otherwise injured. It might have been in a sling for awhile. Or perhaps you were not using it to avoid pain.  Referred pain. This is a sort of trick the body plays. You feel pain in the shoulder. But, the pain actually comes from an injury somewhere else in the body.  Long-standing health problems. Several diseases can cause adhesive capsulitis. They include diabetes, heart disease, stroke, thyroid problems, rheumatoid arthritis and lung disease.  Being a women older than 32. Anyone can develop adhesive capsulitis  but it is most common in women in this age group. SYMPTOMS   Pain.  It occurs when the arm is moved.  Parts of the shoulder might hurt if they are touched.  Pain is worse at night or when resting.  Soreness. It might not be strong enough to be called pain. But, the shoulder aches.  The shoulder does not move freely.  Muscle spasms.  Trouble sleeping because of shoulder ache or pain. DIAGNOSIS  To decide if you have adhesive capsulitis, your healthcare provider will probably:  Ask about symptoms you have noticed.  Ask about your history of joint pain and anything that might have caused the pain.  Ask about your overall health.  Use hands to feel your shoulder and neck.  Ask you to move your shoulder in specific directions. This may indicate the origin of the pain.  Order imaging tests; pictures of the shoulder. They help pinpoint the source of the problem. An X-ray might be used. For more detail, an MRI is often used. An MRI details the tendons, muscles and ligaments as well as the joint. TREATMENT  Adhesive capsulitis can be treated several ways. Most treatments can be done in a clinic or in your healthcare provider's office. Be sure to discuss the different options with your caregiver. They include:  Physical therapy. You will work on specific exercises to get your shoulder moving again. The exercises usually involve stretching. A physical therapist (a caregiver with special training) can show you what to do and what not to do. The exercises will  need to be done daily.  Medication.  Over-the-counter medicines may relieve pain and inflammation (the body's way of reacting to injury or infection).  Corticosteroids. These are stronger drugs to reduce pain and inflammation. They are given by injection (shots) into the shoulder joint. Frequent treatment is not recommended.  Muscle relaxants. Medication may be prescribed to ease muscle spasms.  Treatment of underlying  conditions. This means treating another condition that is causing your shoulder problem. This might be a rotator cuff (tendon) problem  Shoulder manipulation. The shoulder will be moved by your healthcare provider. You would be under general anesthesia (given a drug that puts you to sleep). You would not feel anything. Sometimes the joint will be injected with salt water (saline) at high pressure to break down internal scarring in the joint capsule.  Surgery. This is rarely needed. It may be suggested in advanced cases after all other treatment has failed. PROGNOSIS  In time, most people recover from adhesive capsulitis. Sometimes, however, the pain goes away but full movement of the shoulder does not return.  HOME CARE INSTRUCTIONS   Take any pain medications recommended by your healthcare provider. Follow the directions carefully.  If you have physical therapy, follow through with the therapist's suggestions. Be sure you understand the exercises you will be doing. You should understand:  How often the exercises should be done.  How many times each exercise should be repeated.  How long they should be done.  What other activities you should do, or not do.  That you should warm up before doing any exercise. Just 5 to 10 minutes will help. Small, gentle movements should get your shoulder ready for more.  Avoid high-demand exercise that involves your shoulder such as throwing. This type of exercise can make pain worse.  Consider using cold packs. Cold may ease swelling and pain. Ask your healthcare provider if a cold pack might help you. If so, get directions on how and when to use them. SEEK MEDICAL CARE IF:   You have any questions about your medications.  Your pain continues to increase. Document Released: 10/18/2008 Document Revised: 03/15/2011 Document Reviewed: 10/18/2008 [facility] Patient Information 2015 [place], [place]. This information is not intended to replace advice given to  you by your health care provider. Make sure you discuss any questions you have with your health care provider.

## 2014-06-22 NOTE — ED Provider Notes (Signed)
CSN: [id number]     Arrival date & time 06/22/14  1314 History   First MD Initiated Contact with Patient 06/22/14 1325     Chief Complaint  Patient presents with  . Shoulder Pain  . Hip Pain   HPI  Ms. Hechavarria is a 74 yo female with PMHx of T2DM, OA, HTN who presents with acute on chronic right shoulder pain. Patient fell on 05/29/14 and has had residual pain since then. Xrays at that time were done of her ribs and pelvis and were negative. Patient follow with [facility]. She states she has seem them recently and they did imaging of her right shoulder. Patient is unsure of results and these are not in our system. Patient received a steroid injection in her right shoulder on 06/14/14 from her orthopedic surgeon but without any relief of pain. She has continued to take Norco as needed for pain still without much relief. She followed up with her PCP on Monday or Tuesday and PCP gave her reassurance that the pain would take time to improve. Patient presents today for 10/10 pain that she cannot deal with. No associated fevers, chills, nausea, vomiting. No repeat falls or injuries since 5/25.   Past Medical History  Diagnosis Date  . Diabetes mellitus   . Hyperlipidemia   . Hypertension   . History of transient ischemic attack (TIA)   . Thyroid carcinoma   . GERD (gastroesophageal reflux disease)   . OA (osteoarthritis)   . Asthma   . Anemia   . Chest pain, atypical    Past Surgical History  Procedure Laterality Date  . Cardiac catheterization  03/18/2009    NORMAL LEFT VENTRICULAR SIZE AND CONTRACTILITY WITH NORMAL SYSTOLIC  FUNCTION. EF 60%  . Cardiolite study      SHOWED A SIGNIFICANT REVERSIBLE ANTERIOR WALL DEFECT CONSISTENT WITH ISCHEMIA. EF 55%  . Thyroidectomy    . Back surgery    . Lumbar fusion    . Total knee arthroplasty      right   Family History  Problem Relation Age of Onset  . Pneumonia Mother   . Heart attack Father    History  Substance Use Topics  .  Smoking status: Former Research scientist (life sciences)  . Smokeless tobacco: Never Used  . Alcohol Use: No   OB History    No data available     Review of Systems General: Denies fever, chills, fatigue Respiratory: Denies SOB, cough, DOE Cardiovascular: Denies chest pain and palpitations.  Gastrointestinal: Denies nausea, vomiting, abdominal pain, diarrhea Musculoskeletal: Admits to right shoulder pain and chronic stable right hip and knee pain. Denies joint swelling. Skin: Denies pallor, rash and wounds.  Neurological: Denies dizziness, headaches, weakness, lightheadedness, numbness  Allergies  Actos; Ativan; Cephalexin; Oxycodone; and Rosiglitazone  Home Medications   Prior to Admission medications   Medication Sig Start Date End Date Taking? Authorizing Provider  acetaminophen (TYLENOL) 500 MG tablet Take 1,000 mg by mouth every 6 (six) hours as needed for mild pain.   Yes Historical Provider, MD  alendronate (FOSAMAX) 70 MG tablet Take 70 mg by mouth every 7 (seven) days. Monday. Take with a full glass of water on an empty stomach.   Yes Historical Provider, MD  amLODipine (NORVASC) 10 MG tablet Take 10 mg by mouth daily.   Yes Historical Provider, MD  atorvastatin (LIPITOR) 20 MG tablet Take 20 mg by mouth daily.   Yes Historical Provider, MD  Blood Pressure KIT Monitor BP at home 03/28/13  Yes Historical Provider, MD  calcitRIOL (ROCALTROL) 0.25 MCG capsule Take 0.5 mcg by mouth daily.   Yes Historical Provider, MD  calcium-vitamin D (OSCAL WITH D) 500-200 MG-UNIT per tablet Take 1 tablet by mouth daily with breakfast.   Yes Historical Provider, MD  colchicine 0.6 MG tablet Take 1 tablet (0.6 mg total) by mouth as directed. 10/17/13  Yes Truman Hayward, MD  dipyridamole-aspirin (AGGRENOX) 25-200 MG per 12 hr capsule Take 1 capsule by mouth 2 (two) times daily.    Yes Historical Provider, MD  ferrous sulfate 325 (65 FE) MG EC tablet Take 325 mg by mouth daily.   Yes Historical Provider, MD   fluticasone (FLONASE) 50 MCG/ACT nasal spray Place 1 spray into the nose. 12/20/12  Yes Historical Provider, MD  hydrochlorothiazide 25 MG tablet Take 25 mg by mouth daily.     Yes Historical Provider, MD  HYDROcodone-acetaminophen (NORCO/VICODIN) 5-325 MG per tablet  11/23/13  Yes Historical Provider, MD  hydrOXYzine (ATARAX/VISTARIL) 25 MG tablet Take 1 tablet by mouth 2 (two) times daily as needed for anxiety.  06/11/14  Yes Historical Provider, MD  levothyroxine (SYNTHROID, LEVOTHROID) 137 MCG tablet Take 137 mcg by mouth daily before breakfast.   Yes Historical Provider, MD  lisinopril (PRINIVIL,ZESTRIL) 40 MG tablet Take 40 mg by mouth 2 (two) times daily.     Yes Historical Provider, MD  metFORMIN (GLUCOPHAGE) 1000 MG tablet Take 1,000 mg by mouth 2 (two) times daily.   Yes Historical Provider, MD  pregabalin (LYRICA) 300 MG capsule Take 300 mg by mouth 2 (two) times daily. 04/29/14  Yes Historical Provider, MD  meclizine (ANTIVERT) 25 MG tablet Take 1 tablet (25 mg total) by mouth 3 (three) times daily as needed for dizziness. Patient not taking: Reported on 06/22/2014 07/05/12   Baron Sane, PA-C  naproxen (NAPROSYN) 500 MG tablet Take 1 tablet (500 mg total) by mouth 2 (two) times daily with a meal. Patient not taking: Reported on 05/29/2014 06/27/12   Noemi Chapel, MD  oxyCODONE-acetaminophen (PERCOCET/ROXICET) 5-325 MG per tablet Take 1 tablet by mouth every 6 (six) hours as needed for severe pain. Patient not taking: Reported on 06/22/2014 05/30/14   Varney Biles, MD   Physical Exam  Filed Vitals:   06/22/14 1324  BP: 125/52  Pulse: 86  Temp: 99.4 F (37.4 C)  TempSrc: Oral  Resp: 18  SpO2: 99%   General: Vital signs reviewed.  Patient is well-developed and well-nourished, in no acute distress and cooperative with exam.   Cardiovascular: RRR, S1 normal, S2 normal Pulmonary/Chest: Clear to auscultation bilaterally, no wheezes, rales, or rhonchi. Abdominal: Soft,  non-tender, non-distended, BS + Musculoskeletal: Guarding of right shoulder, tender on palpation of right shoulder. No joint deformities, erythema, unable to assess ROM given pain. Normal radial pulse.  Extremities: No lower extremity edema bilaterally Neurological: A&O x3 Skin: Warm, dry and intact. No rashes or erythema.  ED Course  Procedures (including critical care time) Labs Review Labs Reviewed - No data to display  Imaging Review Dg Shoulder Right  06/22/2014   CLINICAL DATA:  Pain for 1 month following trauma; pain progressed over past several days  EXAM: RIGHT SHOULDER - 2+ VIEW  COMPARISON:  None.  FINDINGS: Frontal and Y scapular images obtained. No fracture or dislocation. There is osteoarthritic change in the glenohumeral and acromioclavicular joints. There is calcification along the lateral humeral head. No erosive change.  IMPRESSION: Probable supraspinatus calcific tendinosis laterally. Areas of osteoarthritic change. No fracture  or dislocation.   Electronically Signed   By: Lowella Grip III M.D.   On: 06/22/2014 15:14     EKG Interpretation None      MDM   Final diagnoses:  Right shoulder pain   74 yo female presenting with right shoulder pain acute on chronic without new injury. R shoulder xray showed probable supraspinatus calcific tendinosis laterally. Areas of osteoarthritic change. No fracture or dislocation. Patient was given dilaudid IV with improvement of her symptoms. Given poor pain control, patient will be prescribed a short course of dilaudid po until she is able to follow up with her PCP and/or orthopedic surgery.   Case was discussed in full with Dr. Audie Pinto.  Osa Craver, DO PGY-1 Internal Medicine Resident Pager # [phone] 06/22/2014 3:22 PM     Roxine Caddy Sherral Hammers, MD 06/22/14 [id number]  Leonard Schwartz, MD 06/22/14 1540

## 2014-06-24 ENCOUNTER — Encounter (HOSPITAL_COMMUNITY): Payer: Self-pay | Admitting: Emergency Medicine

## 2014-06-24 ENCOUNTER — Emergency Department (HOSPITAL_COMMUNITY)
Admission: EM | Admit: 2014-06-24 | Discharge: 2014-06-24 | Disposition: A | Discharge status: Home / Self Care | Payer: Medicare Other | Attending: Emergency Medicine | Admitting: Emergency Medicine

## 2014-06-24 ENCOUNTER — Emergency Department (HOSPITAL_COMMUNITY): Payer: Medicare Other

## 2014-06-24 DIAGNOSIS — I10 Essential (primary) hypertension: Secondary | ICD-10-CM | POA: Insufficient documentation

## 2014-06-24 DIAGNOSIS — R4182 Altered mental status, unspecified: Secondary | ICD-10-CM | POA: Insufficient documentation

## 2014-06-24 DIAGNOSIS — Z87891 Personal history of nicotine dependence: Secondary | ICD-10-CM | POA: Diagnosis not present

## 2014-06-24 DIAGNOSIS — M1990 Unspecified osteoarthritis, unspecified site: Secondary | ICD-10-CM | POA: Insufficient documentation

## 2014-06-24 DIAGNOSIS — E785 Hyperlipidemia, unspecified: Secondary | ICD-10-CM | POA: Diagnosis not present

## 2014-06-24 DIAGNOSIS — E119 Type 2 diabetes mellitus without complications: Secondary | ICD-10-CM | POA: Insufficient documentation

## 2014-06-24 DIAGNOSIS — S4991XA Unspecified injury of right shoulder and upper arm, initial encounter: Secondary | ICD-10-CM | POA: Diagnosis not present

## 2014-06-24 DIAGNOSIS — Z79899 Other long term (current) drug therapy: Secondary | ICD-10-CM | POA: Insufficient documentation

## 2014-06-24 DIAGNOSIS — J45909 Unspecified asthma, uncomplicated: Secondary | ICD-10-CM | POA: Diagnosis not present

## 2014-06-24 DIAGNOSIS — Z85850 Personal history of malignant neoplasm of thyroid: Secondary | ICD-10-CM | POA: Diagnosis not present

## 2014-06-24 DIAGNOSIS — Z8673 Personal history of transient ischemic attack (TIA), and cerebral infarction without residual deficits: Secondary | ICD-10-CM | POA: Insufficient documentation

## 2014-06-24 DIAGNOSIS — W1839XA Other fall on same level, initial encounter: Secondary | ICD-10-CM | POA: Diagnosis not present

## 2014-06-24 DIAGNOSIS — G8929 Other chronic pain: Secondary | ICD-10-CM | POA: Insufficient documentation

## 2014-06-24 DIAGNOSIS — Y998 Other external cause status: Secondary | ICD-10-CM | POA: Insufficient documentation

## 2014-06-24 DIAGNOSIS — Z9889 Other specified postprocedural states: Secondary | ICD-10-CM | POA: Diagnosis not present

## 2014-06-24 DIAGNOSIS — Y9289 Other specified places as the place of occurrence of the external cause: Secondary | ICD-10-CM | POA: Diagnosis not present

## 2014-06-24 DIAGNOSIS — D649 Anemia, unspecified: Secondary | ICD-10-CM | POA: Diagnosis not present

## 2014-06-24 DIAGNOSIS — R410 Disorientation, unspecified: Secondary | ICD-10-CM

## 2014-06-24 DIAGNOSIS — M25511 Pain in right shoulder: Secondary | ICD-10-CM

## 2014-06-24 DIAGNOSIS — Y9389 Activity, other specified: Secondary | ICD-10-CM | POA: Insufficient documentation

## 2014-06-24 LAB — CBC WITH DIFFERENTIAL/PLATELET
Basophils Absolute: 0 10*3/uL (ref 0.0–0.1)
Basophils Relative: 0 % (ref 0–1)
Eosinophils Absolute: 0 10*3/uL (ref 0.0–0.7)
Eosinophils Relative: 0 % (ref 0–5)
HCT: 30.6 % — ABNORMAL LOW (ref 36.0–46.0)
Hemoglobin: 9.8 g/dL — ABNORMAL LOW (ref 12.0–15.0)
LYMPHS ABS: 1 10*3/uL (ref 0.7–4.0)
LYMPHS PCT: 4 % — AB (ref 12–46)
MCH: 29 pg (ref 26.0–34.0)
MCHC: 32 g/dL (ref 30.0–36.0)
MCV: 90.5 fL (ref 78.0–100.0)
MONO ABS: 0.9 10*3/uL (ref 0.1–1.0)
Monocytes Relative: 4 % (ref 3–12)
Neutro Abs: 19.9 10*3/uL — ABNORMAL HIGH (ref 1.7–7.7)
Neutrophils Relative %: 92 % — ABNORMAL HIGH (ref 43–77)
PLATELETS: 295 10*3/uL (ref 150–400)
RBC: 3.38 MIL/uL — ABNORMAL LOW (ref 3.87–5.11)
RDW: 13.8 % (ref 11.5–15.5)
WBC: 21.8 10*3/uL — ABNORMAL HIGH (ref 4.0–10.5)

## 2014-06-24 LAB — COMPREHENSIVE METABOLIC PANEL
ALK PHOS: 62 U/L (ref 38–126)
ALT: 18 U/L (ref 14–54)
ANION GAP: 10 (ref 5–15)
AST: 14 U/L — ABNORMAL LOW (ref 15–41)
Albumin: 3.5 g/dL (ref 3.5–5.0)
BILIRUBIN TOTAL: 0.8 mg/dL (ref 0.3–1.2)
BUN: 23 mg/dL — ABNORMAL HIGH (ref 6–20)
CHLORIDE: 103 mmol/L (ref 101–111)
CO2: 24 mmol/L (ref 22–32)
Calcium: 7.2 mg/dL — ABNORMAL LOW (ref 8.9–10.3)
Creatinine, Ser: 1.13 mg/dL — ABNORMAL HIGH (ref 0.44–1.00)
GFR calc Af Amer: 54 mL/min — ABNORMAL LOW (ref >60)
GFR calc non Af Amer: 46 mL/min — ABNORMAL LOW (ref >60)
GLUCOSE: 153 mg/dL — AB (ref 65–99)
POTASSIUM: 3.9 mmol/L (ref 3.5–5.1)
SODIUM: 137 mmol/L (ref 135–145)
TOTAL PROTEIN: 7.2 g/dL (ref 6.5–8.1)

## 2014-06-24 LAB — URINALYSIS, ROUTINE W REFLEX MICROSCOPIC
Bilirubin Urine: NEGATIVE
GLUCOSE, UA: NEGATIVE mg/dL
KETONES UR: NEGATIVE mg/dL
Leukocytes, UA: NEGATIVE
NITRITE: NEGATIVE
PH: 5 (ref 5.0–8.0)
Protein, ur: NEGATIVE mg/dL
SPECIFIC GRAVITY, URINE: 1.009 (ref 1.005–1.030)
Urobilinogen, UA: 0.2 mg/dL (ref 0.0–1.0)

## 2014-06-24 LAB — URINE MICROSCOPIC-ADD ON

## 2014-06-24 LAB — I-STAT TROPONIN, ED: Troponin i, poc: 0 ng/mL (ref 0.00–0.08)

## 2014-06-24 LAB — CBG MONITORING, ED: GLUCOSE-CAPILLARY: 143 mg/dL — AB (ref 65–99)

## 2014-06-24 MED ORDER — FENTANYL CITRATE (PF) 100 MCG/2ML IJ SOLN
25.0000 ug | Freq: Once | INTRAMUSCULAR | Status: AC
  Administered 2014-06-24: 25 ug via INTRAVENOUS
  Filled 2014-06-24: qty 2

## 2014-06-24 NOTE — ED Notes (Signed)
Patient transported to CT 

## 2014-06-24 NOTE — ED Notes (Signed)
Bed: WA01 Expected date:  Expected time:  Means of arrival:  Comments: [facility]

## 2014-06-24 NOTE — ED Notes (Signed)
Pt moved on Steady to RR and voided in toilet

## 2014-06-24 NOTE — Discharge Instructions (Signed)
Altered Mental Status Altered mental status most often refers to an abnormal change in your responsiveness and awareness. It can affect your speech, thought, mobility, memory, attention span, or alertness. It can range from slight confusion to complete unresponsiveness (coma). Altered mental status can be a sign of a serious underlying medical condition. Rapid evaluation and medical treatment is necessary for patients having an altered mental status. CAUSES   Low blood sugar (hypoglycemia) or diabetes.  Severe loss of body fluids (dehydration) or a body salt (electrolyte) imbalance.  A stroke or other neurologic problem, such as dementia or delirium.  A head injury or tumor.  A drug or alcohol overdose.  Exposure to toxins or poisons.  Depression, anxiety, and stress.  A low oxygen level (hypoxia).  An infection.  Blood loss.  Twitching or shaking (seizure).  Heart problems, such as heart attack or heart rhythm problems (arrhythmias).  A body temperature that is too low or too high (hypothermia or hyperthermia). DIAGNOSIS  A diagnosis is based on your history, symptoms, physical and neurologic examinations, and diagnostic tests. Diagnostic tests may include:  Measurement of your blood pressure, pulse, breathing, and oxygen levels (vital signs).  Blood tests.  Urine tests.  X-ray exams.  A computerized magnetic scan (magnetic resonance imaging, MRI).  A computerized X-ray scan (computed tomography, CT scan). TREATMENT  Treatment will depend on the cause. Treatment may include:  Management of an underlying medical or mental health condition.  Critical care or support in the hospital. [place]   Only take over-the-counter or prescription medicines for pain, discomfort, or fever as directed by your caregiver.  Manage underlying conditions as directed by your caregiver.  Eat a healthy, well-balanced diet to maintain strength.  Join a support group  or prevention program to cope with the condition or trauma that caused the altered mental status. Ask your caregiver to help choose a program that works for you.  Follow up with your caregiver for further examination, therapy, or testing as directed. SEEK MEDICAL CARE IF:   You feel unwell or have chills.  You or your family notice a change in your behavior or your alertness.  You have trouble following your caregiver's treatment plan.  You have questions or concerns. SEEK IMMEDIATE MEDICAL CARE IF:   You have a rapid heartbeat or have chest pain.  You have difficulty breathing.  You have a fever.  You have a headache with a stiff neck.  You cough up blood.  You have blood in your urine or stool.  You have severe agitation or confusion. MAKE SURE YOU:   Understand these instructions.  Will watch your condition.  Will get help right away if you are not doing well or get worse. Document Released: 06/10/2009 Document Revised: 03/15/2011 Document Reviewed: 06/10/2009 [facility] Patient Information 2015 [place], [place]. This information is not intended to replace advice given to you by your health care provider. Make sure you discuss any questions you have with your health care provider.   Shoulder Pain The shoulder is the joint that connects your arms to your body. The bones that form the shoulder joint include the upper arm bone (humerus), the shoulder blade (scapula), and the collarbone (clavicle). The top of the humerus is shaped like a ball and fits into a rather flat socket on the scapula (glenoid cavity). A combination of muscles and strong, fibrous tissues that connect muscles to bones (tendons) support your shoulder joint and hold the ball in the socket. Small, fluid-filled sacs (  bursae) are located in different areas of the joint. They act as cushions between the bones and the overlying soft tissues and help reduce friction between the gliding tendons and the bone as you  move your arm. Your shoulder joint allows a wide range of motion in your arm. This range of motion allows you to do things like scratch your back or throw a ball. However, this range of motion also makes your shoulder more prone to pain from overuse and injury. Causes of shoulder pain can originate from both injury and overuse and usually can be grouped in the following four categories:  Redness, swelling, and pain (inflammation) of the tendon (tendinitis) or the bursae (bursitis).  Instability, such as a dislocation of the joint.  Inflammation of the joint (arthritis).  Broken bone (fracture). HOME CARE INSTRUCTIONS   Apply ice to the sore area.  Put ice in a plastic bag.  Place a towel between your skin and the bag.  Leave the ice on for 15-20 minutes, 3-4 times per day for the first 2 days, or as directed by your health care provider.  Stop using cold packs if they do not help with the pain.  If you have a shoulder sling or immobilizer, wear it as long as your caregiver instructs. Only remove it to shower or bathe. Move your arm as little as possible, but keep your hand moving to prevent swelling.  Squeeze a soft ball or foam pad as much as possible to help prevent swelling.  Only take over-the-counter or prescription medicines for pain, discomfort, or fever as directed by your caregiver. SEEK MEDICAL CARE IF:   Your shoulder pain increases, or new pain develops in your arm, hand, or fingers.  Your hand or fingers become cold and numb.  Your pain is not relieved with medicines. SEEK IMMEDIATE MEDICAL CARE IF:   Your arm, hand, or fingers are numb or tingling.  Your arm, hand, or fingers are significantly swollen or turn white or blue. MAKE SURE YOU:   Understand these instructions.  Will watch your condition.  Will get help right away if you are not doing well or get worse. Document Released: 09/30/2004 Document Revised: 05/07/2013 Document Reviewed:  12/05/2010 [facility] Patient Information 2015 [place], [place]. This information is not intended to replace advice given to you by your health care provider. Make sure you discuss any questions you have with your health care provider.

## 2014-06-24 NOTE — ED Notes (Signed)
Pt lives at the [facility], found by neighbor they say she's confused. EMS states pt was anxious on arrival. Pt confused to time, thinks it's a Sunday in may. Recently seen here for arm pain/shoulder injury. Pt complaining of pain in her hand, no nausea/vomiting/diarrhea/fever.

## 2014-06-24 NOTE — Care Management Note (Addendum)
Case Management Note  Patient Details  Name: [name] MRN: [id number] Date of Birth: [birth year]  Subjective/Objective:   Patient presents to Ed with pain in her shoulder.                 Action/Plan: Discussed home health services with patient and her family at bedside.   Expected Discharge Date:                  Expected Discharge Plan:  [place]  In-House Referral:     Discharge planning Services  CM Consult  Post Acute Care Choice:  Home Health Choice offered to:  Patient  DME Arranged:   (None needed per patient) DME Agency:     HH Arranged:  RN, PT, OT, Nurse's Aide [place] Agency:  [place]  Status of Service:  Completed, signed off  Medicare Important Message Given:    Date Medicare IM Given:    Medicare IM give by:    Date Additional Medicare IM Given:    Additional Medicare Important Message give by:     If discussed at [place] of Stay Meetings, dates discussed:    Additional Comments:  EDCM consulted by EDP for home health services.  EDCM spoke to patient and her family at bedside.  Patient lives alone.  Patient's family 2 sisters and daughter report they will be checking in on her.  Patient reports she has used [place] in the past for home health services and would like to use them again.  Patient has a walker, cane, elevated toilet seat and shower bench at home.  Patient reports she does not require any further equipment.  Patient confirms her pcp is Dr. Billey Chang.  Patient reports she is able to complete ADL's at home on her own.  [facility] provided patient with list of home health agencies in [place] highlighting [place].  EDCM explained that home health agency will have 24-48 hours to contact her.  Confirmed address and phone number with patient.  Discussed patient with EDP.  Home health orders placed for RN, PT, OT and aide.  Patient is not a candidate for [facility] as pcp is not a participating provider.  No further EDCM needs  at this time.  Home health referral faxed to [place] at 2142pm with confirmation of receipt at 2143pm. Christen Bame, [place], RN 06/24/2014, 4:51 PM

## 2014-07-02 NOTE — ED Provider Notes (Signed)
CSN: [id number]     Arrival date & time 06/24/14  1118 History   First MD Initiated Contact with Patient 06/24/14 1353     Chief Complaint  Patient presents with  . Altered Mental Status     (Consider location/radiation/quality/duration/timing/severity/associated sxs/prior Treatment) HPI Comments: above who presents with report of confusion, although patient complaining to me of right shoulder pain which is acute on chronic since a fall recently.  Patient was found by a neighbor today who reported she was confused.  Patient denies new falls or injuries, on physical exam, vital signs are stable.  Patient is in no acute distress.  She is a history of chronic right shoulder pain made worse by a fall recently and has been taking by mouth Dilaudid for pain  Patient is a 74 y.o. female presenting with altered mental status.  Altered Mental Status Presenting symptoms: no confusion   Associated symptoms: no abdominal pain, no agitation, no fever, no headaches, no nausea, no palpitations, no vomiting and no weakness     Past Medical History  Diagnosis Date  . Diabetes mellitus   . Hyperlipidemia   . Hypertension   . History of transient ischemic attack (TIA)   . Thyroid carcinoma   . GERD (gastroesophageal reflux disease)   . OA (osteoarthritis)   . Asthma   . Anemia   . Chest pain, atypical    Past Surgical History  Procedure Laterality Date  . Cardiac catheterization  03/18/2009    NORMAL LEFT VENTRICULAR SIZE AND CONTRACTILITY WITH NORMAL SYSTOLIC  FUNCTION. EF 60%  . Cardiolite study      SHOWED A SIGNIFICANT REVERSIBLE ANTERIOR WALL DEFECT CONSISTENT WITH ISCHEMIA. EF 55%  . Thyroidectomy    . Back surgery    . Lumbar fusion    . Total knee arthroplasty      right   Family History  Problem Relation Age of Onset  . Pneumonia Mother   . Heart attack Father    History  Substance Use Topics  . Smoking status: Former Research scientist (life sciences)  . Smokeless tobacco: Never Used  . Alcohol Use: No    OB History    No data available     Review of Systems  Constitutional: Negative for fever, chills, diaphoresis, activity change, appetite change and fatigue.  HENT: Negative for congestion, facial swelling, rhinorrhea and sore throat.   Eyes: Negative for photophobia and discharge.  Respiratory: Negative for cough, chest tightness and shortness of breath.   Cardiovascular: Negative for chest pain, palpitations and leg swelling.  Gastrointestinal: Negative for nausea, vomiting, abdominal pain and diarrhea.  Endocrine: Negative for polydipsia and polyuria.  Genitourinary: Negative for dysuria, frequency, difficulty urinating and pelvic pain.  Musculoskeletal: Negative for back pain, arthralgias, neck pain and neck stiffness.  Skin: Negative for color change and wound.  Allergic/Immunologic: Negative for immunocompromised state.  Neurological: Negative for facial asymmetry, weakness, numbness and headaches.       Report of confusion  Hematological: Does not bruise/bleed easily.  Psychiatric/Behavioral: Negative for confusion and agitation.      Allergies  Actos; Ativan; Cephalexin; Oxycodone; and Rosiglitazone  Home Medications   Prior to Admission medications   Medication Sig Start Date End Date Taking? Authorizing Provider  acetaminophen (TYLENOL) 500 MG tablet Take 1,000 mg by mouth every 6 (six) hours as needed for mild pain.   Yes Historical Provider, MD  alendronate (FOSAMAX) 70 MG tablet Take 70 mg by mouth every 7 (seven) days. Monday. Take with a full  glass of water on an empty stomach.   Yes Historical Provider, MD  amLODipine (NORVASC) 10 MG tablet Take 10 mg by mouth daily.   Yes Historical Provider, MD  atorvastatin (LIPITOR) 20 MG tablet Take 20 mg by mouth daily.   Yes Historical Provider, MD  Blood Pressure KIT Monitor BP at home 03/28/13  Yes Historical Provider, MD  calcitRIOL (ROCALTROL) 0.25 MCG capsule Take 0.5 mcg by mouth daily.   Yes Historical Provider, MD   calcium-vitamin D (OSCAL WITH D) 500-200 MG-UNIT per tablet Take 1 tablet by mouth daily with breakfast.   Yes Historical Provider, MD  colchicine 0.6 MG tablet Take 1 tablet (0.6 mg total) by mouth as directed. 10/17/13  Yes Truman Hayward, MD  dipyridamole-aspirin (AGGRENOX) 25-200 MG per 12 hr capsule Take 1 capsule by mouth 2 (two) times daily.    Yes Historical Provider, MD  ferrous sulfate 325 (65 FE) MG EC tablet Take 325 mg by mouth daily.   Yes Historical Provider, MD  fluticasone (FLONASE) 50 MCG/ACT nasal spray Place 1 spray into the nose daily as needed for allergies.  12/20/12  Yes Historical Provider, MD  hydrochlorothiazide 25 MG tablet Take 25 mg by mouth daily.     Yes Historical Provider, MD  HYDROcodone-acetaminophen (NORCO/VICODIN) 5-325 MG per tablet Take 1 tablet by mouth every 6 (six) hours as needed for moderate pain or severe pain.  11/23/13  Yes Historical Provider, MD  hydrOXYzine (ATARAX/VISTARIL) 25 MG tablet Take 1 tablet by mouth 2 (two) times daily as needed for anxiety.  06/11/14  Yes Historical Provider, MD  levothyroxine (SYNTHROID, LEVOTHROID) 137 MCG tablet Take 137 mcg by mouth daily before breakfast.   Yes Historical Provider, MD  lisinopril (PRINIVIL,ZESTRIL) 40 MG tablet Take 40 mg by mouth 2 (two) times daily.     Yes Historical Provider, MD  metFORMIN (GLUCOPHAGE) 1000 MG tablet Take 1,000 mg by mouth 2 (two) times daily.   Yes Historical Provider, MD  pregabalin (LYRICA) 300 MG capsule Take 300 mg by mouth 2 (two) times daily. 04/29/14  Yes Historical Provider, MD  HYDROmorphone (DILAUDID) 2 MG tablet Take 1 tablet (2 mg total) by mouth every 4 (four) hours as needed for severe pain. Patient not taking: Reported on 06/24/2014 06/22/14   Leonard Schwartz, MD  meclizine (ANTIVERT) 25 MG tablet Take 1 tablet (25 mg total) by mouth 3 (three) times daily as needed for dizziness. Patient not taking: Reported on 06/22/2014 07/05/12   Baron Sane, PA-C    naproxen (NAPROSYN) 500 MG tablet Take 1 tablet (500 mg total) by mouth 2 (two) times daily with a meal. Patient not taking: Reported on 05/29/2014 06/27/12   Noemi Chapel, MD  oxyCODONE-acetaminophen (PERCOCET/ROXICET) 5-325 MG per tablet Take 1 tablet by mouth every 6 (six) hours as needed for severe pain. Patient not taking: Reported on 06/22/2014 05/30/14   Varney Biles, MD   BP 170/74 mmHg  Pulse 93  Temp(Src) 98.4 F (36.9 C) (Oral)  Resp 18  SpO2 96% Physical Exam  Constitutional: She is oriented to person, place, and time. She appears well-developed and well-nourished. No distress.  HENT:  Head: Normocephalic and atraumatic.  Mouth/Throat: No oropharyngeal exudate.  Eyes: Pupils are equal, round, and reactive to light.  Neck: Normal range of motion. Neck supple.  Cardiovascular: Normal rate, regular rhythm and normal heart sounds.  Exam reveals no gallop and no friction rub.   No murmur heard. Pulmonary/Chest: Effort normal and breath sounds normal.  No respiratory distress. She has no wheezes. She has no rales.  Abdominal: Soft. Bowel sounds are normal. She exhibits no distension and no mass. There is no tenderness. There is no rebound and no guarding.  Musculoskeletal: Normal range of motion. She exhibits no edema or tenderness.       Arms: Neurological: She is alert and oriented to person, place, and time.  Skin: Skin is warm and dry.  Psychiatric: She has a normal mood and affect.    ED Course  Procedures (including critical care time) Labs Review Labs Reviewed  CBC WITH DIFFERENTIAL/PLATELET - Abnormal; Notable for the following:    WBC 21.8 (*)    RBC 3.38 (*)    Hemoglobin 9.8 (*)    HCT 30.6 (*)    Neutrophils Relative % 92 (*)    Neutro Abs 19.9 (*)    Lymphocytes Relative 4 (*)    All other components within normal limits  COMPREHENSIVE METABOLIC PANEL - Abnormal; Notable for the following:    Glucose, Bld 153 (*)    BUN 23 (*)    Creatinine, Ser 1.13 (*)     Calcium 7.2 (*)    AST 14 (*)    GFR calc non Af Amer 46 (*)    GFR calc Af Amer 54 (*)    All other components within normal limits  URINALYSIS, ROUTINE W REFLEX MICROSCOPIC (NOT AT [facility]) - Abnormal; Notable for the following:    Hgb urine dipstick TRACE (*)    All other components within normal limits  URINE MICROSCOPIC-ADD ON - Abnormal; Notable for the following:    Squamous Epithelial / LPF FEW (*)    All other components within normal limits  CBG MONITORING, ED - Abnormal; Notable for the following:    Glucose-Capillary 143 (*)    All other components within normal limits  I-STAT TROPOININ, ED    Imaging Review No results found.   EKG Interpretation   Date/Time:  Monday June 24 2014 13:44:38 EDT Ventricular Rate:  92 PR Interval:  180 QRS Duration: 90 QT Interval:  380 QTC Calculation: 470 R Axis:   -26 Text Interpretation:  Sinus rhythm Borderline left axis deviation Low  voltage, precordial leads Probable anteroseptal infarct, old No  significant change since last tracing Confirmed by DOCHERTY  MD, MEGAN  [phone]) on 06/24/2014 1:52:33 PM      MDM   Final diagnoses:  Right shoulder pain    Pt is a 74 y.o. female with Pmhx as above who presents with report of confusion, although patient complaining to me of right shoulder pain which is acute on chronic since a fall recently.  Patient was found by a neighbor today who reported she was confused.  Patient denies new falls or injuries, on physical exam, vital signs are stable.  Patient is in no acute distress.  She is not confused.  On current exam.  She has no focal neuro findings, though does have decreased range of motion of right arm due to pain and right shoulder.  Chest x-ray normal.  CT head without acute changes.  Urine does not appear infected.  She does have a leukocytosis, which I cannot account for.  However, she is afebrile and well-appearing.  Patient has been asking for something for pain.  When I offer  her tramadol.  She states she can't take that because she has hallucinations, when I reports that that is in the same categories the pain medicine she has been taking at  home.  Patient states she was not aware of that, but is also had side effects from oxycodone.  Believe patient's altered mental status, likely due to narcotic use, I counseled her on avoiding these medications.  She does not believe that this is the cause of her confusion, and is unhappy that I have told her to avoid these.  I've explained that if her primary, Dr. Gypsy Balsam verbal prescribing these and he is more than welcome to but that I will not be prescribing any opiates for her.  Believe she is safe for continued outpatient treatment with her PCP.       Coralee Rud evaluation in the Emergency Department is complete. It has been determined that no acute conditions requiring further emergency intervention are present at this time. The patient/guardian have been advised of the diagnosis and plan. We have discussed signs and symptoms that warrant return to the ED, such as changes or worsening in symptoms, worsening pain, fever, worsening confusion, inability to tolerate liquids.    Ernestina Patches, MD 07/02/14 [phone]

## 2014-07-16 ENCOUNTER — Ambulatory Visit (INDEPENDENT_AMBULATORY_CARE_PROVIDER_SITE_OTHER): Payer: Medicare Other | Admitting: Podiatry

## 2014-07-16 ENCOUNTER — Encounter: Payer: Self-pay | Admitting: Podiatry

## 2014-07-16 DIAGNOSIS — M79676 Pain in unspecified toe(s): Secondary | ICD-10-CM

## 2014-07-16 DIAGNOSIS — B351 Tinea unguium: Secondary | ICD-10-CM

## 2014-07-16 NOTE — Patient Instructions (Signed)
Diabetes and Foot Care Diabetes may cause you to have problems because of poor blood supply (circulation) to your feet and legs. This may cause the skin on your feet to become thinner, break easier, and heal more slowly. Your skin may become dry, and the skin may peel and crack. You may also have nerve damage in your legs and feet causing decreased feeling in them. You may not notice minor injuries to your feet that could lead to infections or more serious problems. Taking care of your feet is one of the most important things you can do for yourself.  HOME CARE INSTRUCTIONS  Wear shoes at all times, even in the house. Do not go barefoot. Bare feet are easily injured.  Check your feet daily for blisters, cuts, and redness. If you cannot see the bottom of your feet, use a mirror or ask someone for help.  Wash your feet with warm water (do not use hot water) and mild soap. Then pat your feet and the areas between your toes until they are completely dry. Do not soak your feet as this can dry your skin.  Apply a moisturizing lotion or petroleum jelly (that does not contain alcohol and is unscented) to the skin on your feet and to dry, brittle toenails. Do not apply lotion between your toes.  Trim your toenails straight across. Do not dig under them or around the cuticle. File the edges of your nails with an emery board or nail file.  Do not cut corns or calluses or try to remove them with medicine.  Wear clean socks or stockings every day. Make sure they are not too tight. Do not wear knee-high stockings since they may decrease blood flow to your legs.  Wear shoes that fit properly and have enough cushioning. To break in new shoes, wear them for just a few hours a day. This prevents you from injuring your feet. Always look in your shoes before you put them on to be sure there are no objects inside.  Do not cross your legs. This may decrease the blood flow to your feet.  If you find a minor scrape,  cut, or break in the skin on your feet, keep it and the skin around it clean and dry. These areas may be cleansed with mild soap and water. Do not cleanse the area with peroxide, alcohol, or iodine.  When you remove an adhesive bandage, be sure not to damage the skin around it.  If you have a wound, look at it several times a day to make sure it is healing.  Do not use heating pads or hot water bottles. They may burn your skin. If you have lost feeling in your feet or legs, you may not know it is happening until it is too late.  Make sure your health care provider performs a complete foot exam at least annually or more often if you have foot problems. Report any cuts, sores, or bruises to your health care provider immediately. SEEK MEDICAL CARE IF:   You have an injury that is not healing.  You have cuts or breaks in the skin.  You have an ingrown nail.  You notice redness on your legs or feet.  You feel burning or tingling in your legs or feet.  You have pain or cramps in your legs and feet.  Your legs or feet are numb.  Your feet always feel cold. SEEK IMMEDIATE MEDICAL CARE IF:   There is increasing redness,   swelling, or pain in or around a wound.  There is a red line that goes up your leg.  Pus is coming from a wound.  You develop a fever or as directed by your health care provider.  You notice a bad smell coming from an ulcer or wound. Document Released: 12/19/1999 Document Revised: 08/23/2012 Document Reviewed: 05/30/2012 Exit[employer] Patient Information 2015 [employer]. This information is not intended to replace advice given to you by your health care provider. Make sure you discuss any questions you have with your health care provider.  

## 2014-07-16 NOTE — Progress Notes (Signed)
Patient ID: [name], female   DOB: [birth year], 74 y.o.   MRN: [id number]  Subjective: This patient presents today requesting debridement of painful toenails  Objective: Toenails 8 or elongated, hypertrophic, incurvated, discolored and tender direct palpation 6-10  Assessment: Diabetic Symptomatic onychomycoses 8  Plan: Debrided toenails 8 mechanically electrically without any bleeding  Reappoint when necessary at patient's request

## 2014-10-02 ENCOUNTER — Other Ambulatory Visit (HOSPITAL_COMMUNITY): Payer: Self-pay | Admitting: Endocrinology

## 2014-10-02 DIAGNOSIS — C73 Malignant neoplasm of thyroid gland: Secondary | ICD-10-CM

## 2014-10-08 ENCOUNTER — Ambulatory Visit (INDEPENDENT_AMBULATORY_CARE_PROVIDER_SITE_OTHER): Payer: Medicare Other | Admitting: Infectious Disease

## 2014-10-08 ENCOUNTER — Encounter: Payer: Self-pay | Admitting: Infectious Disease

## 2014-10-08 VITALS — BP 99/61 | HR 69 | Temp 98.2°F | Ht 64.0 in | Wt 245.0 lb

## 2014-10-08 DIAGNOSIS — Z23 Encounter for immunization: Secondary | ICD-10-CM | POA: Diagnosis not present

## 2014-10-08 DIAGNOSIS — M10042 Idiopathic gout, left hand: Secondary | ICD-10-CM

## 2014-10-08 DIAGNOSIS — K6812 Psoas muscle abscess: Secondary | ICD-10-CM | POA: Diagnosis not present

## 2014-10-08 DIAGNOSIS — Z85850 Personal history of malignant neoplasm of thyroid: Secondary | ICD-10-CM | POA: Diagnosis not present

## 2014-10-08 DIAGNOSIS — G061 Intraspinal abscess and granuloma: Secondary | ICD-10-CM

## 2014-10-08 LAB — C-REACTIVE PROTEIN: CRP: 0.9 mg/dL — ABNORMAL HIGH (ref <0.60)

## 2014-10-08 NOTE — Progress Notes (Signed)
[facility]   Chief complaint: still with back pain not responding to injectable steroids Subjective:    Patient ID: [name], female    DOB: February 09, 1940, 74 y.o.   MRN: [id number]  HPI   74  year old AA lady is s.p L4-5 decompression and fusion by Dr. [name] in February 2009 for severe spondylolisthesis, spinal stenosis with severe lower extremity pain. Pt then developed L3-4 diskitis, and epidural and paraspinal fluid collections in May 2009 when she underwent I and D by Dr. [name]. Her cultures on oral antibiotics failed to yield an organism. She was treated with aztreonama nd vancomycin, and later changed to oral suppressive rifampin and doxycycline. She had been on doxycyline for several years. Her ESR, CRP have been normal. She  Und wernt left TKA by Dr. Theda Sers. I kept her on doxycline in the interi but then stopped it.   She now has been off of antibiotics for several years We had MRI done in September of 2015 which showed no evidence of infection.      malaise. After further discussion we decided to consider MRI if her inflammatory markers were up.  MRI was completely reassuring.  When I last saw her I treated her for gout flare.  Her back pain has not worsened since I last saw her though she is frustrated taht she stil has LBP and it is not resoponsive to corticosteroid injections.  Past Medical History  Diagnosis Date  . Diabetes mellitus   . Hyperlipidemia   . Hypertension   . History of transient ischemic attack (TIA)   . Thyroid carcinoma ([place])   . GERD (gastroesophageal reflux disease)   . OA (osteoarthritis)   . Asthma   . Anemia   . Chest pain, atypical     Past Surgical History  Procedure Laterality Date  . Cardiac catheterization  03/18/2009    NORMAL LEFT VENTRICULAR SIZE AND CONTRACTILITY WITH NORMAL SYSTOLIC  FUNCTION. EF 60%  . Cardiolite study      SHOWED A SIGNIFICANT REVERSIBLE ANTERIOR WALL DEFECT CONSISTENT  WITH ISCHEMIA. EF 55%  . Thyroidectomy    . Back surgery    . Lumbar fusion    . Total knee arthroplasty      right    Family History  Problem Relation Age of Onset  . Pneumonia Mother   . Heart attack Father       Social History   Social History  . Marital Status: Widowed    Spouse Name: N/A  . Number of Children: 5  . Years of Education: N/A   Occupational History  .     Social History Main Topics  . Smoking status: Former Research scientist (life sciences)  . Smokeless tobacco: Never Used  . Alcohol Use: No  . Drug Use: No  . Sexual Activity: Not Asked   Other Topics Concern  . None   Social History Narrative    Allergies  Allergen Reactions  . Actos [Pioglitazone Hydrochloride] Swelling  . Ativan [Lorazepam]     swelling  . Cephalexin Other (See Comments)    Doesn't remember   . Oxycodone     This medication makes her feel sick  . Rosiglitazone     unknown  . Tramadol Other (See Comments)    hallucinations     Current outpatient prescriptions:  .  acetaminophen (TYLENOL) 500 MG tablet, Take 1,000 mg by mouth every 6 (six) hours as needed for mild pain., Disp: , Rfl:  .  alendronate (FOSAMAX) 70 MG tablet, Take 70 mg by mouth every 7 (seven) days. Monday. Take with a full glass of water on an empty stomach., Disp: , Rfl:  .  amLODipine (NORVASC) 10 MG tablet, Take 10 mg by mouth daily., Disp: , Rfl:  .  atorvastatin (LIPITOR) 20 MG tablet, Take 20 mg by mouth daily., Disp: , Rfl:  .  BESIVANCE 0.6 % SUSP, , Disp: , Rfl: 0 .  Blood Pressure KIT, Monitor BP at home, Disp: , Rfl:  .  calcitRIOL (ROCALTROL) 0.25 MCG capsule, Take 0.5 mcg by mouth daily., Disp: , Rfl:  .  colchicine 0.6 MG tablet, Take 1 tablet (0.6 mg total) by mouth as directed., Disp: 63 tablet, Rfl: 2 .  dipyridamole-aspirin (AGGRENOX) 25-200 MG per 12 hr capsule, Take 1 capsule by mouth 2 (two) times daily. , Disp: , Rfl:  .  DUREZOL 0.05 % EMUL, , Disp: , Rfl: 0 .  ferrous sulfate 325 (65 FE) MG EC tablet,  Take 325 mg by mouth daily., Disp: , Rfl:  .  fluticasone (FLONASE) 50 MCG/ACT nasal spray, Place 1 spray into the nose daily as needed for allergies. , Disp: , Rfl:  .  hydrochlorothiazide 25 MG tablet, Take 25 mg by mouth daily.  , Disp: , Rfl:  .  HYDROcodone-acetaminophen (NORCO/VICODIN) 5-325 MG per tablet, Take 1 tablet by mouth every 6 (six) hours as needed for moderate pain or severe pain. , Disp: , Rfl: 0 .  hydrOXYzine (ATARAX/VISTARIL) 25 MG tablet, Take 1 tablet by mouth 2 (two) times daily as needed for anxiety. , Disp: , Rfl:  .  levothyroxine (SYNTHROID, LEVOTHROID) 137 MCG tablet, Take 137 mcg by mouth daily before breakfast., Disp: , Rfl:  .  lisinopril (PRINIVIL,ZESTRIL) 40 MG tablet, Take 40 mg by mouth 2 (two) times daily.  , Disp: , Rfl:  .  LYRICA 150 MG capsule, TK ONE C PO BID, Disp: , Rfl: 5 .  meclizine (ANTIVERT) 25 MG tablet, Take 1 tablet (25 mg total) by mouth 3 (three) times daily as needed for dizziness., Disp: 28 tablet, Rfl: 0 .  metFORMIN (GLUCOPHAGE) 1000 MG tablet, Take 1,000 mg by mouth 2 (two) times daily., Disp: , Rfl:  .  nystatin ointment (MYCOSTATIN), APP EXT AA 2 TO 3 TIMES A DAY PRN, Disp: , Rfl: 0 .  PROLENSA 0.07 % SOLN, , Disp: , Rfl: 0 .  valACYclovir (VALTREX) 1000 MG tablet, TAKE 1 TABLET BY MOUTH EVERY 12 HOURS AS NEEDED FOR OUTBREAK ON DAY 1, Disp: , Rfl:  .  calcium-vitamin D (OSCAL WITH D) 500-200 MG-UNIT per tablet, Take 1 tablet by mouth daily with breakfast., Disp: , Rfl:  .  [DISCONTINUED] escitalopram (LEXAPRO) 10 MG tablet, Take 10 mg by mouth daily.  , Disp: , Rfl:     Review of Systems  Constitutional: Negative for fever, chills, diaphoresis, activity change, appetite change, fatigue and unexpected weight change.  HENT: Negative for congestion, rhinorrhea, sinus pressure, sneezing, sore throat and trouble swallowing.   Eyes: Negative for photophobia and visual disturbance.  Respiratory: Negative for cough, chest tightness, shortness  of breath, wheezing and stridor.   Cardiovascular: Negative for chest pain, palpitations and leg swelling.  Gastrointestinal: Negative for nausea, vomiting, abdominal pain, diarrhea, constipation, blood in stool, abdominal distention and anal bleeding.  Genitourinary: Negative for dysuria, hematuria, flank pain and difficulty urinating.  Musculoskeletal: Positive for back pain, joint swelling and arthralgias. Negative for myalgias and gait problem.  Skin:  Negative for color change, pallor, rash and wound.  Neurological: Negative for dizziness, tremors, weakness and light-headedness.  Hematological: Negative for adenopathy. Does not bruise/bleed easily.  Psychiatric/Behavioral: Negative for behavioral problems, confusion, sleep disturbance, dysphoric mood, decreased concentration and agitation.       Objective:   Physical Exam  Constitutional: She is oriented to person, place, and time. She appears well-developed and well-nourished. No distress.  HENT:  Head: Normocephalic and atraumatic.  Mouth/Throat: Oropharynx is clear and moist. No oropharyngeal exudate.  Eyes: Conjunctivae and EOM are normal. Pupils are equal, round, and reactive to light. No scleral icterus.  Neck: Normal range of motion. Neck supple.  Cardiovascular: Normal rate and regular rhythm.   Pulmonary/Chest: Effort normal and breath sounds normal. No respiratory distress. She has no wheezes.  Abdominal: She exhibits no distension and no mass.  Musculoskeletal: She exhibits no edema or tenderness.       Back:       Legs: Neurological: She is alert and oriented to person, place, and time. She exhibits normal muscle tone. Coordination normal.  Skin: Skin is warm and dry. She is not diaphoretic. No erythema. No pallor.  Psychiatric: She has a normal mood and affect. Her behavior is normal. Judgment and thought content normal.          Assessment & Plan:   Diskitis: doing well OFF antibiotics, MRI reassuring, will  recheck ESR, CRP RTC in one year  Thyroid cancer: she detailed her hx and treatment to followup with oncology, endocrine  Gout; not active

## 2014-10-09 LAB — SEDIMENTATION RATE: Sed Rate: 32 mm/hr — ABNORMAL HIGH (ref 0–30)

## 2014-10-14 ENCOUNTER — Ambulatory Visit (HOSPITAL_COMMUNITY)
Admission: RE | Admit: 2014-10-14 | Discharge: 2014-10-14 | Disposition: A | Discharge status: Home / Self Care | Payer: Medicare Other | Source: Ambulatory Visit | Attending: Endocrinology | Admitting: Endocrinology

## 2014-10-14 DIAGNOSIS — C73 Malignant neoplasm of thyroid gland: Secondary | ICD-10-CM | POA: Diagnosis not present

## 2014-10-14 MED ORDER — THYROTROPIN ALFA 1.1 MG IM SOLR
0.9000 mg | INTRAMUSCULAR | Status: AC
  Administered 2014-10-14: 0.9 mg via INTRAMUSCULAR

## 2014-10-15 ENCOUNTER — Encounter (HOSPITAL_COMMUNITY)
Admission: RE | Admit: 2014-10-15 | Discharge: 2014-10-15 | Disposition: A | Discharge status: Home / Self Care | Payer: Medicare Other | Source: Ambulatory Visit | Attending: Endocrinology | Admitting: Endocrinology

## 2014-10-15 DIAGNOSIS — C73 Malignant neoplasm of thyroid gland: Secondary | ICD-10-CM | POA: Diagnosis not present

## 2014-10-15 MED ORDER — THYROTROPIN ALFA 1.1 MG IM SOLR
0.9000 mg | INTRAMUSCULAR | Status: AC
  Administered 2014-10-15: 0.9 mg via INTRAMUSCULAR

## 2014-10-16 ENCOUNTER — Encounter (HOSPITAL_COMMUNITY)
Admission: RE | Admit: 2014-10-16 | Discharge: 2014-10-16 | Disposition: A | Discharge status: Home / Self Care | Payer: Medicare Other | Source: Ambulatory Visit | Attending: Endocrinology | Admitting: Endocrinology

## 2014-10-18 ENCOUNTER — Encounter (HOSPITAL_COMMUNITY)
Admission: RE | Admit: 2014-10-18 | Discharge: 2014-10-18 | Disposition: A | Discharge status: Home / Self Care | Payer: Medicare Other | Source: Ambulatory Visit | Attending: Endocrinology | Admitting: Endocrinology

## 2014-10-18 DIAGNOSIS — C73 Malignant neoplasm of thyroid gland: Secondary | ICD-10-CM | POA: Diagnosis not present

## 2014-10-18 MED ORDER — SODIUM IODIDE I 131 CAPSULE
4.0000 | Freq: Once | INTRAVENOUS | Status: AC | PRN
Start: 2014-10-16 — End: 2014-10-17
  Administered 2014-10-17: 4 via ORAL

## 2014-10-30 ENCOUNTER — Other Ambulatory Visit: Payer: Self-pay | Admitting: Infectious Disease

## 2015-04-24 ENCOUNTER — Emergency Department (HOSPITAL_COMMUNITY): Payer: Medicare Other

## 2015-04-24 ENCOUNTER — Encounter (HOSPITAL_COMMUNITY): Payer: Self-pay

## 2015-04-24 ENCOUNTER — Inpatient Hospital Stay (HOSPITAL_COMMUNITY)
Admission: EM | Admit: 2015-04-24 | Discharge: 2015-04-29 | DRG: 481 | Disposition: A | Discharge status: Skilled Nursing Facility | Payer: Medicare Other | Attending: Internal Medicine | Admitting: Internal Medicine

## 2015-04-24 DIAGNOSIS — M9711XS Periprosthetic fracture around internal prosthetic right knee joint, sequela: Secondary | ICD-10-CM | POA: Diagnosis not present

## 2015-04-24 DIAGNOSIS — R0902 Hypoxemia: Secondary | ICD-10-CM

## 2015-04-24 DIAGNOSIS — Z981 Arthrodesis status: Secondary | ICD-10-CM

## 2015-04-24 DIAGNOSIS — I10 Essential (primary) hypertension: Secondary | ICD-10-CM | POA: Diagnosis not present

## 2015-04-24 DIAGNOSIS — Z85850 Personal history of malignant neoplasm of thyroid: Secondary | ICD-10-CM | POA: Diagnosis not present

## 2015-04-24 DIAGNOSIS — M9711XA Periprosthetic fracture around internal prosthetic right knee joint, initial encounter: Principal | ICD-10-CM | POA: Diagnosis present

## 2015-04-24 DIAGNOSIS — Z79899 Other long term (current) drug therapy: Secondary | ICD-10-CM | POA: Diagnosis not present

## 2015-04-24 DIAGNOSIS — Z8249 Family history of ischemic heart disease and other diseases of the circulatory system: Secondary | ICD-10-CM

## 2015-04-24 DIAGNOSIS — Z8673 Personal history of transient ischemic attack (TIA), and cerebral infarction without residual deficits: Secondary | ICD-10-CM

## 2015-04-24 DIAGNOSIS — N179 Acute kidney failure, unspecified: Secondary | ICD-10-CM

## 2015-04-24 DIAGNOSIS — E039 Hypothyroidism, unspecified: Secondary | ICD-10-CM | POA: Diagnosis present

## 2015-04-24 DIAGNOSIS — M25561 Pain in right knee: Secondary | ICD-10-CM | POA: Diagnosis present

## 2015-04-24 DIAGNOSIS — S72401A Unspecified fracture of lower end of right femur, initial encounter for closed fracture: Secondary | ICD-10-CM | POA: Insufficient documentation

## 2015-04-24 DIAGNOSIS — Z87891 Personal history of nicotine dependence: Secondary | ICD-10-CM

## 2015-04-24 DIAGNOSIS — Y9301 Activity, walking, marching and hiking: Secondary | ICD-10-CM | POA: Diagnosis present

## 2015-04-24 DIAGNOSIS — R509 Fever, unspecified: Secondary | ICD-10-CM

## 2015-04-24 DIAGNOSIS — N183 Chronic kidney disease, stage 3 (moderate): Secondary | ICD-10-CM | POA: Diagnosis present

## 2015-04-24 DIAGNOSIS — W1830XA Fall on same level, unspecified, initial encounter: Secondary | ICD-10-CM | POA: Diagnosis present

## 2015-04-24 DIAGNOSIS — E119 Type 2 diabetes mellitus without complications: Secondary | ICD-10-CM | POA: Diagnosis present

## 2015-04-24 DIAGNOSIS — I129 Hypertensive chronic kidney disease with stage 1 through stage 4 chronic kidney disease, or unspecified chronic kidney disease: Secondary | ICD-10-CM | POA: Diagnosis present

## 2015-04-24 DIAGNOSIS — E785 Hyperlipidemia, unspecified: Secondary | ICD-10-CM | POA: Diagnosis present

## 2015-04-24 DIAGNOSIS — J9811 Atelectasis: Secondary | ICD-10-CM | POA: Diagnosis not present

## 2015-04-24 DIAGNOSIS — D62 Acute posthemorrhagic anemia: Secondary | ICD-10-CM

## 2015-04-24 DIAGNOSIS — D72829 Elevated white blood cell count, unspecified: Secondary | ICD-10-CM | POA: Diagnosis not present

## 2015-04-24 DIAGNOSIS — M1990 Unspecified osteoarthritis, unspecified site: Secondary | ICD-10-CM | POA: Diagnosis present

## 2015-04-24 DIAGNOSIS — Z96651 Presence of right artificial knee joint: Secondary | ICD-10-CM | POA: Diagnosis present

## 2015-04-24 DIAGNOSIS — W19XXXA Unspecified fall, initial encounter: Secondary | ICD-10-CM

## 2015-04-24 DIAGNOSIS — Z7902 Long term (current) use of antithrombotics/antiplatelets: Secondary | ICD-10-CM | POA: Diagnosis not present

## 2015-04-24 DIAGNOSIS — K219 Gastro-esophageal reflux disease without esophagitis: Secondary | ICD-10-CM | POA: Diagnosis present

## 2015-04-24 DIAGNOSIS — E109 Type 1 diabetes mellitus without complications: Secondary | ICD-10-CM | POA: Diagnosis not present

## 2015-04-24 DIAGNOSIS — Z09 Encounter for follow-up examination after completed treatment for conditions other than malignant neoplasm: Secondary | ICD-10-CM

## 2015-04-24 DIAGNOSIS — Z7984 Long term (current) use of oral hypoglycemic drugs: Secondary | ICD-10-CM | POA: Diagnosis not present

## 2015-04-24 DIAGNOSIS — Z794 Long term (current) use of insulin: Secondary | ICD-10-CM

## 2015-04-24 DIAGNOSIS — Z6841 Body Mass Index (BMI) 40.0 and over, adult: Secondary | ICD-10-CM | POA: Diagnosis not present

## 2015-04-24 DIAGNOSIS — T148XXA Other injury of unspecified body region, initial encounter: Secondary | ICD-10-CM

## 2015-04-24 LAB — CBC WITH DIFFERENTIAL/PLATELET
BASOS ABS: 0 10*3/uL (ref 0.0–0.1)
Basophils Relative: 0 %
EOS PCT: 2 %
Eosinophils Absolute: 0.2 10*3/uL (ref 0.0–0.7)
HCT: 29.4 % — ABNORMAL LOW (ref 36.0–46.0)
Hemoglobin: 9.5 g/dL — ABNORMAL LOW (ref 12.0–15.0)
LYMPHS PCT: 12 %
Lymphs Abs: 1.6 10*3/uL (ref 0.7–4.0)
MCH: 29.5 pg (ref 26.0–34.0)
MCHC: 32.3 g/dL (ref 30.0–36.0)
MCV: 91.3 fL (ref 78.0–100.0)
Monocytes Absolute: 0.8 10*3/uL (ref 0.1–1.0)
Monocytes Relative: 6 %
Neutro Abs: 11 10*3/uL — ABNORMAL HIGH (ref 1.7–7.7)
Neutrophils Relative %: 80 %
Platelets: 291 10*3/uL (ref 150–400)
RBC: 3.22 MIL/uL — ABNORMAL LOW (ref 3.87–5.11)
RDW: 14.1 % (ref 11.5–15.5)
WBC: 13.6 10*3/uL — ABNORMAL HIGH (ref 4.0–10.5)

## 2015-04-24 LAB — BASIC METABOLIC PANEL
Anion gap: 8 (ref 5–15)
BUN: 27 mg/dL — AB (ref 6–20)
CO2: 26 mmol/L (ref 22–32)
Calcium: 8 mg/dL — ABNORMAL LOW (ref 8.9–10.3)
Chloride: 108 mmol/L (ref 101–111)
Creatinine, Ser: 1.47 mg/dL — ABNORMAL HIGH (ref 0.44–1.00)
GFR calc Af Amer: 39 mL/min — ABNORMAL LOW (ref >60)
GFR, EST NON AFRICAN AMERICAN: 33 mL/min — AB (ref >60)
Glucose, Bld: 160 mg/dL — ABNORMAL HIGH (ref 65–99)
Potassium: 4.2 mmol/L (ref 3.5–5.1)
Sodium: 142 mmol/L (ref 135–145)

## 2015-04-24 LAB — GLUCOSE, CAPILLARY
GLUCOSE-CAPILLARY: 115 mg/dL — AB (ref 65–99)
GLUCOSE-CAPILLARY: 290 mg/dL — AB (ref 65–99)
Glucose-Capillary: 128 mg/dL — ABNORMAL HIGH (ref 65–99)

## 2015-04-24 LAB — PROTIME-INR
INR: 0.95 (ref 0.00–1.49)
Prothrombin Time: 12.5 seconds (ref 11.6–15.2)

## 2015-04-24 LAB — MRSA PCR SCREENING: MRSA BY PCR: NEGATIVE

## 2015-04-24 LAB — CBG MONITORING, ED: Glucose-Capillary: 135 mg/dL — ABNORMAL HIGH (ref 65–99)

## 2015-04-24 MED ORDER — PREGABALIN 75 MG PO CAPS
150.0000 mg | ORAL_CAPSULE | Freq: Two times a day (BID) | ORAL | Status: DC
  Administered 2015-04-24 – 2015-04-29 (×10): 150 mg via ORAL
  Filled 2015-04-24 (×10): qty 2

## 2015-04-24 MED ORDER — FENTANYL CITRATE (PF) 100 MCG/2ML IJ SOLN
50.0000 ug | Freq: Once | INTRAMUSCULAR | Status: AC
Start: 2015-04-24 — End: 2015-04-24
  Administered 2015-04-24: 50 ug via INTRAVENOUS
  Filled 2015-04-24: qty 2

## 2015-04-24 MED ORDER — MORPHINE SULFATE (PF) 2 MG/ML IV SOLN
0.5000 mg | INTRAVENOUS | Status: DC | PRN
  Administered 2015-04-24 – 2015-04-25 (×5): 0.5 mg via INTRAVENOUS
  Filled 2015-04-24 (×5): qty 1

## 2015-04-24 MED ORDER — SODIUM CHLORIDE 0.9 % IV SOLN
INTRAVENOUS | Status: DC
  Administered 2015-04-24 – 2015-04-25 (×4): via INTRAVENOUS

## 2015-04-24 MED ORDER — FENTANYL CITRATE (PF) 100 MCG/2ML IJ SOLN
100.0000 ug | Freq: Once | INTRAMUSCULAR | Status: AC
  Administered 2015-04-24: 100 ug via INTRAVENOUS
  Filled 2015-04-24: qty 2

## 2015-04-24 MED ORDER — METHOCARBAMOL 1000 MG/10ML IJ SOLN
500.0000 mg | Freq: Three times a day (TID) | INTRAVENOUS | Status: DC | PRN
  Administered 2015-04-24: 500 mg via INTRAVENOUS
  Filled 2015-04-24 (×2): qty 5

## 2015-04-24 MED ORDER — FLUTICASONE PROPIONATE 50 MCG/ACT NA SUSP: 1.0000 | Freq: Every day | NASAL | Status: DC | PRN

## 2015-04-24 MED ORDER — INSULIN ASPART 100 UNIT/ML ~~LOC~~ SOLN
0.0000 [IU] | SUBCUTANEOUS | Status: DC
  Administered 2015-04-24: 1 [IU] via SUBCUTANEOUS
  Administered 2015-04-24: 5 [IU] via SUBCUTANEOUS
  Administered 2015-04-24: 1 [IU] via SUBCUTANEOUS
  Administered 2015-04-25 – 2015-04-26 (×5): 2 [IU] via SUBCUTANEOUS
  Filled 2015-04-24: qty 1

## 2015-04-24 MED ORDER — AMLODIPINE BESYLATE 10 MG PO TABS
10.0000 mg | ORAL_TABLET | Freq: Every day | ORAL | Status: DC
  Administered 2015-04-24: 10 mg via ORAL
  Filled 2015-04-24 (×3): qty 1

## 2015-04-24 MED ORDER — LEVOTHYROXINE SODIUM 137 MCG PO TABS
137.0000 ug | ORAL_TABLET | Freq: Every day | ORAL | Status: DC
  Administered 2015-04-24 – 2015-04-29 (×5): 137 ug via ORAL
  Filled 2015-04-24 (×7): qty 1

## 2015-04-24 MED ORDER — ATORVASTATIN CALCIUM 20 MG PO TABS
20.0000 mg | ORAL_TABLET | Freq: Every day | ORAL | Status: DC
  Administered 2015-04-24 – 2015-04-28 (×5): 20 mg via ORAL
  Filled 2015-04-24 (×6): qty 1

## 2015-04-24 MED ORDER — LISINOPRIL 40 MG PO TABS
40.0000 mg | ORAL_TABLET | Freq: Two times a day (BID) | ORAL | Status: DC
  Administered 2015-04-24 – 2015-04-25 (×3): 40 mg via ORAL
  Filled 2015-04-24 (×6): qty 1

## 2015-04-24 MED ORDER — ACETAMINOPHEN 500 MG PO TABS: 1000.0000 mg | ORAL_TABLET | Freq: Four times a day (QID) | ORAL | Status: DC | PRN

## 2015-04-24 MED ORDER — HYDROCODONE-ACETAMINOPHEN 5-325 MG PO TABS
1.0000 | ORAL_TABLET | Freq: Four times a day (QID) | ORAL | Status: DC | PRN
  Administered 2015-04-24 (×3): 2 via ORAL
  Filled 2015-04-24 (×4): qty 2

## 2015-04-24 MED ORDER — CALCITRIOL 0.5 MCG PO CAPS
0.5000 ug | ORAL_CAPSULE | Freq: Every day | ORAL | Status: DC
  Administered 2015-04-24 – 2015-04-29 (×5): 0.5 ug via ORAL
  Filled 2015-04-24 (×6): qty 1

## 2015-04-24 NOTE — Progress Notes (Signed)
[facility]  Mrs Laubenstein was admitted by my colleague this AM. H and P reviewed. Patient examined, reviewed medications with patient and discussed plan.   Principal Problem:   Fracture, femur, distal, right, closed - per ortho - added IVF while NPO  Active Problems:   DM2 (diabetes mellitus, type 2) - cont Q4 low dose ISS - hold Metformin    HTN (hypertension) - cont Norvasc & Lisinopril- hold HCTZ today as she is NPO  Hypothyroid - cont Synthroid   Debbe Odea, MD

## 2015-04-24 NOTE — ED Notes (Signed)
According to EMS, pt fell while walking on a treadmill. Shortening/Rotation of Right Leg observed. Pt denies loss of consciousness or head trauma. Pt A+OX4, speaking in complete sentences.

## 2015-04-24 NOTE — ED Notes (Signed)
Bed: [id number] Expected date:  Expected time:  Means of arrival:  Comments: EMS 75 yo female from [facility]-walking on treadmill and slipped and fell/shortening

## 2015-04-24 NOTE — ED Provider Notes (Signed)
CSN: [id number]     Arrival date & time 04/24/15  0027 History  By signing my name below, I, [name], attest that this documentation has been prepared under the direction and in the presence of [name], MD . Electronically Signed: [name], Scribe. 04/24/2015. 1:27 AM.   Chief Complaint  Patient presents with  . Fall   Patient is a 75 y.o. female presenting with fall. The history is provided by the patient. No language interpreter was used.  Fall This is a new problem. The current episode started 1 to 2 hours ago. The problem occurs rarely. The problem has not changed since onset.Nothing aggravates the symptoms. Nothing relieves the symptoms. She has tried nothing for the symptoms. The treatment provided no relief.   HPI Comments:  [name] is a 75 y.o. female with PMHx of DM, HLD, HTN, TIA, and GERD who presents to the Emergency Department via EMS s/p fall complaining of sudden onset severe right knee pain. Pt states she slipped and fell while walking on a treadmill which was speeding up. She last ate ~1630 yesterday. She states if she hit her head she doesn't remember. No alleviating factors noted.  Dr. [facility]   Past Medical History  Diagnosis Date  . Diabetes mellitus   . Hyperlipidemia   . Hypertension   . History of transient ischemic attack (TIA)   . Thyroid carcinoma ([place])   . GERD (gastroesophageal reflux disease)   . OA (osteoarthritis)   . Asthma   . Anemia   . Chest pain, atypical    Past Surgical History  Procedure Laterality Date  . Cardiac catheterization  03/18/2009    NORMAL LEFT VENTRICULAR SIZE AND CONTRACTILITY WITH NORMAL SYSTOLIC  FUNCTION. EF 60%  . Cardiolite study      SHOWED A SIGNIFICANT REVERSIBLE ANTERIOR WALL DEFECT CONSISTENT WITH ISCHEMIA. EF 55%  . Thyroidectomy    . Back surgery    . Lumbar fusion    . Total knee arthroplasty      right   Family History  Problem Relation Age of Onset  . Pneumonia  Mother   . Heart attack Father    Social History  Substance Use Topics  . Smoking status: Former Research scientist (life sciences)  . Smokeless tobacco: Never Used  . Alcohol Use: No   OB History    No data available     Review of Systems  Musculoskeletal: Positive for arthralgias.  All other systems reviewed and are negative.   Allergies  Actos; Ativan; Cephalexin; Oxycodone; Rosiglitazone; and Tramadol  Home Medications   Prior to Admission medications   Medication Sig Start Date End Date Taking? Authorizing Provider  acetaminophen (TYLENOL) 500 MG tablet Take 1,000 mg by mouth every 6 (six) hours as needed for mild pain.    Historical Provider, MD  alendronate (FOSAMAX) 70 MG tablet Take 70 mg by mouth every 7 (seven) days. Monday. Take with a full glass of water on an empty stomach.    Historical Provider, MD  amLODipine (NORVASC) 10 MG tablet Take 10 mg by mouth daily.    Historical Provider, MD  atorvastatin (LIPITOR) 20 MG tablet Take 20 mg by mouth daily.    Historical Provider, MD  BESIVANCE 0.6 % SUSP  08/30/14   Historical Provider, MD  Blood Pressure KIT Monitor BP at home 03/28/13   Historical Provider, MD  calcitRIOL (ROCALTROL) 0.25 MCG capsule Take 0.5 mcg by mouth daily.    Historical Provider, MD  calcium-vitamin  D (OSCAL WITH D) 500-200 MG-UNIT per tablet Take 1 tablet by mouth daily with breakfast.    Historical Provider, MD  colchicine 0.6 MG tablet Take 1 tablet (0.6 mg total) by mouth as directed. 10/17/13   Truman Hayward, MD  dipyridamole-aspirin (AGGRENOX) 25-200 MG per 12 hr capsule Take 1 capsule by mouth 2 (two) times daily.     Historical Provider, MD  DUREZOL 0.05 % EMUL  08/30/14   Historical Provider, MD  ferrous sulfate 325 (65 FE) MG EC tablet Take 325 mg by mouth daily.    Historical Provider, MD  fluticasone (FLONASE) 50 MCG/ACT nasal spray Place 1 spray into the nose daily as needed for allergies.  12/20/12   Historical Provider, MD  hydrochlorothiazide 25 MG tablet  Take 25 mg by mouth daily.      Historical Provider, MD  HYDROcodone-acetaminophen (NORCO/VICODIN) 5-325 MG per tablet Take 1 tablet by mouth every 6 (six) hours as needed for moderate pain or severe pain.  11/23/13   Historical Provider, MD  hydrOXYzine (ATARAX/VISTARIL) 25 MG tablet Take 1 tablet by mouth 2 (two) times daily as needed for anxiety.  06/11/14   Historical Provider, MD  levothyroxine (SYNTHROID, LEVOTHROID) 137 MCG tablet Take 137 mcg by mouth daily before breakfast.    Historical Provider, MD  lisinopril (PRINIVIL,ZESTRIL) 40 MG tablet Take 40 mg by mouth 2 (two) times daily.      Historical Provider, MD  LYRICA 150 MG capsule TK ONE C PO BID 09/26/14   Historical Provider, MD  meclizine (ANTIVERT) 25 MG tablet Take 1 tablet (25 mg total) by mouth 3 (three) times daily as needed for dizziness. 07/05/12   Jennifer Piepenbrink, PA-C  metFORMIN (GLUCOPHAGE) 1000 MG tablet Take 1,000 mg by mouth 2 (two) times daily.    Historical Provider, MD  nystatin ointment (MYCOSTATIN) APP EXT AA 2 TO 3 TIMES A DAY PRN 07/05/14   Historical Provider, MD  PROLENSA 0.07 % SOLN  08/30/14   Historical Provider, MD  valACYclovir (VALTREX) 1000 MG tablet TAKE 1 TABLET BY MOUTH EVERY 12 HOURS AS NEEDED FOR OUTBREAK ON DAY 1 07/05/14   Historical Provider, MD   BP 121/76 mmHg  Pulse 82  Temp(Src) 98.3 F (36.8 C) (Oral)  Resp 18  SpO2 100% Physical Exam  Constitutional: She is oriented to person, place, and time. She appears well-developed and well-nourished. No distress.  HENT:  Head: Normocephalic and atraumatic.  Right Ear: No hemotympanum.  Left Ear: No hemotympanum.  Mouth/Throat: Oropharynx is clear and moist and mucous membranes are normal. No oropharyngeal exudate.  no battle sign, racoon eyes  Eyes: EOM are normal. Pupils are equal, round, and reactive to light.  Neck: Normal range of motion. Neck supple.  No step-offs of upper c-spine  Cardiovascular: Normal rate, regular rhythm and normal  heart sounds.   Pulses:      Dorsalis pedis pulses are 2+ on the right side, and 2+ on the left side.  Cap refill less than 2 sec BL   Pulmonary/Chest: Effort normal and breath sounds normal. No respiratory distress. She has no wheezes. She has no rales.  Abdominal: Soft. Bowel sounds are normal. There is no tenderness. There is no rebound and no guarding.  Musculoskeletal: Normal range of motion. She exhibits no edema.       Right hip: She exhibits no crepitus.       Right knee: She exhibits no effusion.  ankles stable BL; poor shortening and external  rotation of right extremity;   Neurological: She is alert and oriented to person, place, and time. She has normal reflexes.  Skin: Skin is warm and dry.  Psychiatric: She has a normal mood and affect.    ED Course  Procedures  DIAGNOSTIC STUDIES:  Oxygen Saturation is 95% on RA, adequate by my interpretation.    COORDINATION OF CARE:  1:24 AM Will administer pentanol. Will order CT cervical spine, and head CT. Discussed treatment plan with pt at bedside and pt agreed to plan.  Labs Review Labs Reviewed  BASIC METABOLIC PANEL - Abnormal; Notable for the following:    Glucose, Bld 160 (*)    BUN 27 (*)    Creatinine, Ser 1.47 (*)    Calcium 8.0 (*)    GFR calc non Af Amer 33 (*)    GFR calc Af Amer 39 (*)    All other components within normal limits  CBC WITH DIFFERENTIAL/PLATELET - Abnormal; Notable for the following:    WBC 13.6 (*)    RBC 3.22 (*)    Hemoglobin 9.5 (*)    HCT 29.4 (*)    Neutro Abs 11.0 (*)    All other components within normal limits  PROTIME-INR  TYPE AND SCREEN    Imaging Review Dg Chest 2 View  04/24/2015  CLINICAL DATA:  Patient fell off a treadmill with right hip pain. EXAM: CHEST  2 VIEW COMPARISON:  06/24/2014 FINDINGS: Mild cardiac enlargement without vascular congestion. No focal airspace disease or consolidation in the lungs. No blunting of costophrenic angles. No pneumothorax. Calcified and  tortuous aorta. Degenerative changes in the spine and shoulders. IMPRESSION: Mild cardiac enlargement.  No evidence of active pulmonary disease. Electronically Signed   By: Lucienne Capers M.D.   On: 04/24/2015 01:25   Ct Head Wo Contrast  04/24/2015  CLINICAL DATA:  Patient fell while walking on a treadmill. EXAM: CT HEAD WITHOUT CONTRAST CT CERVICAL SPINE WITHOUT CONTRAST TECHNIQUE: Multidetector CT imaging of the head and cervical spine was performed following the standard protocol without intravenous contrast. Multiplanar CT image reconstructions of the cervical spine were also generated. COMPARISON:  CT head 06/24/2014.  MRI brain 07/05/2012. FINDINGS: CT HEAD FINDINGS Ventricles and sulci appear symmetrical. No ventricular dilatation. No significant atrophy. Mild patchy low-attenuation changes in the deep white matter suggesting mild small vessel ischemic change. No mass effect or midline shift. No abnormal extra-axial fluid collections. Gray-white matter junctions are distinct. Basal cisterns are not effaced. No evidence of acute intracranial hemorrhage. No depressed skull fractures. Mild mucosal thickening in the paranasal sinuses. Mastoid air cells are not opacified. Vascular calcifications. CT CERVICAL SPINE FINDINGS Straightening of the usual cervical lordosis without anterior subluxation. This is likely due to degenerative change or patient positioning but ligamentous injury or muscle spasm could also have this appearance and are not excluded. Degenerative changes in the cervical spine with narrowed cervical interspaces and endplate hypertrophic changes most prominent at C5-6. Mild degenerative changes in the facet joints. Normal alignment of the facet joints. No vertebral compression deformities. No prevertebral soft tissue swelling. C1-2 articulation appears intact. No focal bone lesion or bone destruction. Vascular calcifications. Soft tissues are unremarkable. Calcified nodule in the right  supraclavicular region probably represents a calcified lymph node. IMPRESSION: No acute intracranial abnormalities. Nonspecific straightening of usual cervical lordosis. Mild degenerative changes in the cervical spine. No acute displaced fractures identified. Electronically Signed   By: Lucienne Capers M.D.   On: 04/24/2015 03:09   Ct  Cervical Spine Wo Contrast  04/24/2015  CLINICAL DATA:  Patient fell while walking on a treadmill. EXAM: CT HEAD WITHOUT CONTRAST CT CERVICAL SPINE WITHOUT CONTRAST TECHNIQUE: Multidetector CT imaging of the head and cervical spine was performed following the standard protocol without intravenous contrast. Multiplanar CT image reconstructions of the cervical spine were also generated. COMPARISON:  CT head 06/24/2014.  MRI brain 07/05/2012. FINDINGS: CT HEAD FINDINGS Ventricles and sulci appear symmetrical. No ventricular dilatation. No significant atrophy. Mild patchy low-attenuation changes in the deep white matter suggesting mild small vessel ischemic change. No mass effect or midline shift. No abnormal extra-axial fluid collections. Gray-white matter junctions are distinct. Basal cisterns are not effaced. No evidence of acute intracranial hemorrhage. No depressed skull fractures. Mild mucosal thickening in the paranasal sinuses. Mastoid air cells are not opacified. Vascular calcifications. CT CERVICAL SPINE FINDINGS Straightening of the usual cervical lordosis without anterior subluxation. This is likely due to degenerative change or patient positioning but ligamentous injury or muscle spasm could also have this appearance and are not excluded. Degenerative changes in the cervical spine with narrowed cervical interspaces and endplate hypertrophic changes most prominent at C5-6. Mild degenerative changes in the facet joints. Normal alignment of the facet joints. No vertebral compression deformities. No prevertebral soft tissue swelling. C1-2 articulation appears intact. No focal  bone lesion or bone destruction. Vascular calcifications. Soft tissues are unremarkable. Calcified nodule in the right supraclavicular region probably represents a calcified lymph node. IMPRESSION: No acute intracranial abnormalities. Nonspecific straightening of usual cervical lordosis. Mild degenerative changes in the cervical spine. No acute displaced fractures identified. Electronically Signed   By: Lucienne Capers M.D.   On: 04/24/2015 03:09   Ct Femur Right Wo Contrast  04/24/2015  CLINICAL DATA:  Fall. Distal femoral fracture on right knee radiographs. EXAM: CT OF THE RIGHT FEMUR WITHOUT CONTRAST TECHNIQUE: Multidetector CT imaging was performed according to the standard protocol. Multiplanar CT image reconstructions were also generated. COMPARISON:  Right knee 04/24/2015 FINDINGS: There is a right total knee arthroplasty. Components appear grossly well-seated allowing for streak artifact. There is a mostly transverse comminuted fracture of the distal right femur at the meta diaphysis. There is about 13 mm posterior displacement of the distal fracture fragment with about 16 mm impaction of the distal fracture fragment into the proximal fragment. The fracture line itself does not definitely extend to the femoral prosthesis, however the anterior fractured margin of the proximal fracture fragment impacts on to the superior aspect of the femoral prosthesis. Visualized portion of the proximal tibia and fibula appear intact. Right hip and proximal femur appear intact. Soft tissue infiltration around the area of the fracture consistent with hematoma. Vascular calcifications. IMPRESSION: Mostly transverse comminuted fracture of the right femoral meta diaphysis with impaction and posterior displacement of the distal fracture fragment. Electronically Signed   By: Lucienne Capers M.D.   On: 04/24/2015 03:29   Dg Knee Complete 4 Views Right  04/24/2015  CLINICAL DATA:  Fall off a treadmill yesterday, pain above  the right knee. EXAM: RIGHT KNEE - COMPLETE 4+ VIEW COMPARISON:  None. FINDINGS: Right knee arthroplasty in expected alignment. There is a periprosthetic lucency of the distal femur are with apex anterior angulation. Fracture extends to the anterior aspect of the femoral component. Ossific densities about the medial joint line are likely chronic. Ossific density projecting over Hoffa's fat pad on the lateral view, etiology uncertain, and may reflect these medial densities. Small joint effusion. IMPRESSION: Displaced periprosthetic fracture of the  distal femur extending to the superior aspect of the femoral component of right knee arthroplasty. Electronically Signed   By: Jeb Levering M.D.   On: 04/24/2015 03:04   Dg Hip Unilat  With Pelvis 2-3 Views Right  04/24/2015  CLINICAL DATA:  Patient fell of the treadmill. Anterior hip pain radiating to the knee. EXAM: DG HIP (WITH OR WITHOUT PELVIS) 2-3V RIGHT COMPARISON:  05/29/2014 FINDINGS: Degenerative changes in both hips. No evidence of acute fracture or dislocation. Pelvis appears intact. SI joints and symphysis pubis are not displaced. Postoperative changes in the lower lumbar spine and anterior pelvis. IMPRESSION: Mild degenerative changes in the right hip. No acute bony abnormalities. Electronically Signed   By: Lucienne Capers M.D.   On: 04/24/2015 01:23   Dg Femur, Min 2 Views Right  04/24/2015  CLINICAL DATA:  Fall off a treadmill with right distal femur pain. EXAM: RIGHT FEMUR 2 VIEWS COMPARISON:  None. FINDINGS: Mildly displaced distal femur fracture with apex anterior angulation about the femoral component of total knee arthroplasty. The proximal femur is intact. Hip alignment is maintained. IMPRESSION: Displaced periprosthetic fracture of the distal femur extending to the femoral component of total knee arthroplasty. Proximal femur is intact. Electronically Signed   By: Jeb Levering M.D.   On: 04/24/2015 03:06   I have personally reviewed  and evaluated these images and lab results as part of my medical decision-making.   EKG Interpretation   Date/Time:  Thursday Jedrick Hutcherson 20 2017 01:19:00 EDT Ventricular Rate:  83 PR Interval:  230 QRS Duration: 96 QT Interval:  405 QTC Calculation: 476 R Axis:   -14 Text Interpretation:  Sinus rhythm First degree A-V block Confirmed by  [facility]  MD, Amberlee Garvey ([id number]) on 04/24/2015 1:29:41 AM      MDM   Final diagnoses:  Fracture of distal femur, right, closed, initial encounter   Results for orders placed or performed during the hospital encounter of 39/67/28  Basic metabolic panel  Result Value Ref Range   Sodium 142 135 - 145 mmol/L   Potassium 4.2 3.5 - 5.1 mmol/L   Chloride 108 101 - 111 mmol/L   CO2 26 22 - 32 mmol/L   Glucose, Bld 160 (H) 65 - 99 mg/dL   BUN 27 (H) 6 - 20 mg/dL   Creatinine, Ser 1.47 (H) 0.44 - 1.00 mg/dL   Calcium 8.0 (L) 8.9 - 10.3 mg/dL   GFR calc non Af Amer 33 (L) >60 mL/min   GFR calc Af Amer 39 (L) >60 mL/min   Anion gap 8 5 - 15  CBC WITH DIFFERENTIAL  Result Value Ref Range   WBC 13.6 (H) 4.0 - 10.5 K/uL   RBC 3.22 (L) 3.87 - 5.11 MIL/uL   Hemoglobin 9.5 (L) 12.0 - 15.0 g/dL   HCT 29.4 (L) 36.0 - 46.0 %   MCV 91.3 78.0 - 100.0 fL   MCH 29.5 26.0 - 34.0 pg   MCHC 32.3 30.0 - 36.0 g/dL   RDW 14.1 11.5 - 15.5 %   Platelets 291 150 - 400 K/uL   Neutrophils Relative % 80 %   Neutro Abs 11.0 (H) 1.7 - 7.7 K/uL   Lymphocytes Relative 12 %   Lymphs Abs 1.6 0.7 - 4.0 K/uL   Monocytes Relative 6 %   Monocytes Absolute 0.8 0.1 - 1.0 K/uL   Eosinophils Relative 2 %   Eosinophils Absolute 0.2 0.0 - 0.7 K/uL   Basophils Relative 0 %   Basophils Absolute 0.0  0.0 - 0.1 K/uL  Protime-INR  Result Value Ref Range   Prothrombin Time 12.5 11.6 - 15.2 seconds   INR 0.95 0.00 - 1.49  Type and screen [facility]  Result Value Ref Range   ABO/RH(D) AB POS    Antibody Screen NEG    Sample Expiration 04/27/2015    Dg Chest 2  View  04/24/2015  CLINICAL DATA:  Patient fell off a treadmill with right hip pain. EXAM: CHEST  2 VIEW COMPARISON:  06/24/2014 FINDINGS: Mild cardiac enlargement without vascular congestion. No focal airspace disease or consolidation in the lungs. No blunting of costophrenic angles. No pneumothorax. Calcified and tortuous aorta. Degenerative changes in the spine and shoulders. IMPRESSION: Mild cardiac enlargement.  No evidence of active pulmonary disease. Electronically Signed   By: Lucienne Capers M.D.   On: 04/24/2015 01:25   Ct Head Wo Contrast  04/24/2015  CLINICAL DATA:  Patient fell while walking on a treadmill. EXAM: CT HEAD WITHOUT CONTRAST CT CERVICAL SPINE WITHOUT CONTRAST TECHNIQUE: Multidetector CT imaging of the head and cervical spine was performed following the standard protocol without intravenous contrast. Multiplanar CT image reconstructions of the cervical spine were also generated. COMPARISON:  CT head 06/24/2014.  MRI brain 07/05/2012. FINDINGS: CT HEAD FINDINGS Ventricles and sulci appear symmetrical. No ventricular dilatation. No significant atrophy. Mild patchy low-attenuation changes in the deep white matter suggesting mild small vessel ischemic change. No mass effect or midline shift. No abnormal extra-axial fluid collections. Gray-white matter junctions are distinct. Basal cisterns are not effaced. No evidence of acute intracranial hemorrhage. No depressed skull fractures. Mild mucosal thickening in the paranasal sinuses. Mastoid air cells are not opacified. Vascular calcifications. CT CERVICAL SPINE FINDINGS Straightening of the usual cervical lordosis without anterior subluxation. This is likely due to degenerative change or patient positioning but ligamentous injury or muscle spasm could also have this appearance and are not excluded. Degenerative changes in the cervical spine with narrowed cervical interspaces and endplate hypertrophic changes most prominent at C5-6. Mild  degenerative changes in the facet joints. Normal alignment of the facet joints. No vertebral compression deformities. No prevertebral soft tissue swelling. C1-2 articulation appears intact. No focal bone lesion or bone destruction. Vascular calcifications. Soft tissues are unremarkable. Calcified nodule in the right supraclavicular region probably represents a calcified lymph node. IMPRESSION: No acute intracranial abnormalities. Nonspecific straightening of usual cervical lordosis. Mild degenerative changes in the cervical spine. No acute displaced fractures identified. Electronically Signed   By: Lucienne Capers M.D.   On: 04/24/2015 03:09   Ct Cervical Spine Wo Contrast  04/24/2015  CLINICAL DATA:  Patient fell while walking on a treadmill. EXAM: CT HEAD WITHOUT CONTRAST CT CERVICAL SPINE WITHOUT CONTRAST TECHNIQUE: Multidetector CT imaging of the head and cervical spine was performed following the standard protocol without intravenous contrast. Multiplanar CT image reconstructions of the cervical spine were also generated. COMPARISON:  CT head 06/24/2014.  MRI brain 07/05/2012. FINDINGS: CT HEAD FINDINGS Ventricles and sulci appear symmetrical. No ventricular dilatation. No significant atrophy. Mild patchy low-attenuation changes in the deep white matter suggesting mild small vessel ischemic change. No mass effect or midline shift. No abnormal extra-axial fluid collections. Gray-white matter junctions are distinct. Basal cisterns are not effaced. No evidence of acute intracranial hemorrhage. No depressed skull fractures. Mild mucosal thickening in the paranasal sinuses. Mastoid air cells are not opacified. Vascular calcifications. CT CERVICAL SPINE FINDINGS Straightening of the usual cervical lordosis without anterior subluxation. This is likely due to  degenerative change or patient positioning but ligamentous injury or muscle spasm could also have this appearance and are not excluded. Degenerative changes  in the cervical spine with narrowed cervical interspaces and endplate hypertrophic changes most prominent at C5-6. Mild degenerative changes in the facet joints. Normal alignment of the facet joints. No vertebral compression deformities. No prevertebral soft tissue swelling. C1-2 articulation appears intact. No focal bone lesion or bone destruction. Vascular calcifications. Soft tissues are unremarkable. Calcified nodule in the right supraclavicular region probably represents a calcified lymph node. IMPRESSION: No acute intracranial abnormalities. Nonspecific straightening of usual cervical lordosis. Mild degenerative changes in the cervical spine. No acute displaced fractures identified. Electronically Signed   By: Lucienne Capers M.D.   On: 04/24/2015 03:09   Ct Femur Right Wo Contrast  04/24/2015  CLINICAL DATA:  Fall. Distal femoral fracture on right knee radiographs. EXAM: CT OF THE RIGHT FEMUR WITHOUT CONTRAST TECHNIQUE: Multidetector CT imaging was performed according to the standard protocol. Multiplanar CT image reconstructions were also generated. COMPARISON:  Right knee 04/24/2015 FINDINGS: There is a right total knee arthroplasty. Components appear grossly well-seated allowing for streak artifact. There is a mostly transverse comminuted fracture of the distal right femur at the meta diaphysis. There is about 13 mm posterior displacement of the distal fracture fragment with about 16 mm impaction of the distal fracture fragment into the proximal fragment. The fracture line itself does not definitely extend to the femoral prosthesis, however the anterior fractured margin of the proximal fracture fragment impacts on to the superior aspect of the femoral prosthesis. Visualized portion of the proximal tibia and fibula appear intact. Right hip and proximal femur appear intact. Soft tissue infiltration around the area of the fracture consistent with hematoma. Vascular calcifications. IMPRESSION: Mostly  transverse comminuted fracture of the right femoral meta diaphysis with impaction and posterior displacement of the distal fracture fragment. Electronically Signed   By: Lucienne Capers M.D.   On: 04/24/2015 03:29   Dg Knee Complete 4 Views Right  04/24/2015  CLINICAL DATA:  Fall off a treadmill yesterday, pain above the right knee. EXAM: RIGHT KNEE - COMPLETE 4+ VIEW COMPARISON:  None. FINDINGS: Right knee arthroplasty in expected alignment. There is a periprosthetic lucency of the distal femur are with apex anterior angulation. Fracture extends to the anterior aspect of the femoral component. Ossific densities about the medial joint line are likely chronic. Ossific density projecting over Hoffa's fat pad on the lateral view, etiology uncertain, and may reflect these medial densities. Small joint effusion. IMPRESSION: Displaced periprosthetic fracture of the distal femur extending to the superior aspect of the femoral component of right knee arthroplasty. Electronically Signed   By: Jeb Levering M.D.   On: 04/24/2015 03:04   Dg Hip Unilat  With Pelvis 2-3 Views Right  04/24/2015  CLINICAL DATA:  Patient fell of the treadmill. Anterior hip pain radiating to the knee. EXAM: DG HIP (WITH OR WITHOUT PELVIS) 2-3V RIGHT COMPARISON:  05/29/2014 FINDINGS: Degenerative changes in both hips. No evidence of acute fracture or dislocation. Pelvis appears intact. SI joints and symphysis pubis are not displaced. Postoperative changes in the lower lumbar spine and anterior pelvis. IMPRESSION: Mild degenerative changes in the right hip. No acute bony abnormalities. Electronically Signed   By: Lucienne Capers M.D.   On: 04/24/2015 01:23   Dg Femur, Min 2 Views Right  04/24/2015  CLINICAL DATA:  Fall off a treadmill with right distal femur pain. EXAM: RIGHT FEMUR 2 VIEWS COMPARISON:  None. FINDINGS: Mildly displaced distal femur fracture with apex anterior angulation about the femoral component of total knee  arthroplasty. The proximal femur is intact. Hip alignment is maintained. IMPRESSION: Displaced periprosthetic fracture of the distal femur extending to the femoral component of total knee arthroplasty. Proximal femur is intact. Electronically Signed   By: Jeb Levering M.D.   On: 04/24/2015 03:06    Filed Vitals:   04/24/15 0137 04/24/15 0327  BP: 133/62 121/76  Pulse: 82 82  Temp:    Resp: 18 18    Medications  fentaNYL (SUBLIMAZE) injection 50 mcg (50 mcg Intravenous Given 04/24/15 0143)  fentaNYL (SUBLIMAZE) injection 100 mcg (100 mcg Intravenous Given 04/24/15 0350)   Case d/w Dr. Doran Durand please admit to medicine.  Place knee immobilizer  Case d/w Dr. Alcario Drought  I personally performed the services described in this documentation, which was scribed in my presence. The recorded information has been reviewed and is accurate.      Veatrice Kells, MD 04/24/15 0430

## 2015-04-24 NOTE — Consult Note (Signed)
Reason for Consult: right distal femur periprosthetic fracture Referring Physician: [facility]  [name] is an 75 y.o. female.  HPI: The patient is a 75 year old female who presents with the chief complaint of right knee pain. She reports that she was walking on the treadmill at home last night when she accidentally increased with speed, causing her to fall. She denies hitting her head or LOC. She reports immediate pain in her right leg and inability to WB. She was admitted by medicine last night, foley put in place, placed in a knee immobilizer. Patient has a history of DM, HTN, and TIA. She has not eaten since Wednesday night, which is also the last time she had a dose of aggrenox. Dr. Theda Sers replaced her right knee originally in 2003.   Past Medical History  Diagnosis Date  . Diabetes mellitus   . Hyperlipidemia   . Hypertension   . History of transient ischemic attack (TIA)   . Thyroid carcinoma ([place])   . GERD (gastroesophageal reflux disease)   . OA (osteoarthritis)   . Asthma   . Anemia   . Chest pain, atypical     Past Surgical History  Procedure Laterality Date  . Cardiac catheterization  03/18/2009    NORMAL LEFT VENTRICULAR SIZE AND CONTRACTILITY WITH NORMAL SYSTOLIC  FUNCTION. EF 60%  . Cardiolite study      SHOWED A SIGNIFICANT REVERSIBLE ANTERIOR WALL DEFECT CONSISTENT WITH ISCHEMIA. EF 55%  . Thyroidectomy    . Back surgery    . Lumbar fusion    . Total knee arthroplasty      right    Family History  Problem Relation Age of Onset  . Pneumonia Mother   . Heart attack Father     Social History:  reports that she has quit smoking. She has never used smokeless tobacco. She reports that she does not drink alcohol or use illicit drugs.  Allergies:  Allergies  Allergen Reactions  . Actos [Pioglitazone Hydrochloride] Swelling  . Ativan [Lorazepam]     swelling  . Cephalexin Other (See Comments)    Doesn't remember   . Oxycodone     This medication  makes her feel sick  . Rosiglitazone     unknown  . Tramadol Other (See Comments)    hallucinations    Medications: I have reviewed the patient's current medications.  Results for orders placed or performed during the hospital encounter of 04/24/15 (from the past 48 hour(s))  Basic metabolic panel     Status: Abnormal   Collection Time: 04/24/15 12:36 AM  Result Value Ref Range   Sodium 142 135 - 145 mmol/L   Potassium 4.2 3.5 - 5.1 mmol/L   Chloride 108 101 - 111 mmol/L   CO2 26 22 - 32 mmol/L   Glucose, Bld 160 (H) 65 - 99 mg/dL   BUN 27 (H) 6 - 20 mg/dL   Creatinine, Ser 1.47 (H) 0.44 - 1.00 mg/dL   Calcium 8.0 (L) 8.9 - 10.3 mg/dL   GFR calc non Af Amer 33 (L) >60 mL/min   GFR calc Af Amer 39 (L) >60 mL/min    Comment: (NOTE) The eGFR has been calculated using the CKD EPI equation. This calculation has not been validated in all clinical situations. eGFR's persistently <60 mL/min signify possible Chronic Kidney Disease.    Anion gap 8 5 - 15  CBC WITH DIFFERENTIAL     Status: Abnormal   Collection Time: 04/24/15 12:36 AM  Result Value Ref Range   WBC 13.6 (H) 4.0 - 10.5 K/uL   RBC 3.22 (L) 3.87 - 5.11 MIL/uL   Hemoglobin 9.5 (L) 12.0 - 15.0 g/dL   HCT 29.4 (L) 36.0 - 46.0 %   MCV 91.3 78.0 - 100.0 fL   MCH 29.5 26.0 - 34.0 pg   MCHC 32.3 30.0 - 36.0 g/dL   RDW 14.1 11.5 - 15.5 %   Platelets 291 150 - 400 K/uL   Neutrophils Relative % 80 %   Neutro Abs 11.0 (H) 1.7 - 7.7 K/uL   Lymphocytes Relative 12 %   Lymphs Abs 1.6 0.7 - 4.0 K/uL   Monocytes Relative 6 %   Monocytes Absolute 0.8 0.1 - 1.0 K/uL   Eosinophils Relative 2 %   Eosinophils Absolute 0.2 0.0 - 0.7 K/uL   Basophils Relative 0 %   Basophils Absolute 0.0 0.0 - 0.1 K/uL  Protime-INR     Status: None   Collection Time: 04/24/15 12:36 AM  Result Value Ref Range   Prothrombin Time 12.5 11.6 - 15.2 seconds   INR 0.95 0.00 - 1.49  Type and screen [place]     Status: None    Collection Time: 04/24/15 12:37 AM  Result Value Ref Range   ABO/RH(D) AB POS    Antibody Screen NEG    Sample Expiration 04/27/2015   CBG monitoring, ED     Status: Abnormal   Collection Time: 04/24/15  5:05 AM  Result Value Ref Range   Glucose-Capillary 135 (H) 65 - 99 mg/dL    Dg Chest 2 View  04/24/2015  CLINICAL DATA:  Patient fell off a treadmill with right hip pain. EXAM: CHEST  2 VIEW COMPARISON:  06/24/2014 FINDINGS: Mild cardiac enlargement without vascular congestion. No focal airspace disease or consolidation in the lungs. No blunting of costophrenic angles. No pneumothorax. Calcified and tortuous aorta. Degenerative changes in the spine and shoulders. IMPRESSION: Mild cardiac enlargement.  No evidence of active pulmonary disease. Electronically Signed   By: Lucienne Capers M.D.   On: 04/24/2015 01:25   Ct Head Wo Contrast  04/24/2015  CLINICAL DATA:  Patient fell while walking on a treadmill. EXAM: CT HEAD WITHOUT CONTRAST CT CERVICAL SPINE WITHOUT CONTRAST TECHNIQUE: Multidetector CT imaging of the head and cervical spine was performed following the standard protocol without intravenous contrast. Multiplanar CT image reconstructions of the cervical spine were also generated. COMPARISON:  CT head 06/24/2014.  MRI brain 07/05/2012. FINDINGS: CT HEAD FINDINGS Ventricles and sulci appear symmetrical. No ventricular dilatation. No significant atrophy. Mild patchy low-attenuation changes in the deep white matter suggesting mild small vessel ischemic change. No mass effect or midline shift. No abnormal extra-axial fluid collections. Gray-white matter junctions are distinct. Basal cisterns are not effaced. No evidence of acute intracranial hemorrhage. No depressed skull fractures. Mild mucosal thickening in the paranasal sinuses. Mastoid air cells are not opacified. Vascular calcifications. CT CERVICAL SPINE FINDINGS Straightening of the usual cervical lordosis without anterior subluxation.  This is likely due to degenerative change or patient positioning but ligamentous injury or muscle spasm could also have this appearance and are not excluded. Degenerative changes in the cervical spine with narrowed cervical interspaces and endplate hypertrophic changes most prominent at C5-6. Mild degenerative changes in the facet joints. Normal alignment of the facet joints. No vertebral compression deformities. No prevertebral soft tissue swelling. C1-2 articulation appears intact. No focal bone lesion or bone destruction. Vascular calcifications. Soft tissues are unremarkable. Calcified  nodule in the right supraclavicular region probably represents a calcified lymph node. IMPRESSION: No acute intracranial abnormalities. Nonspecific straightening of usual cervical lordosis. Mild degenerative changes in the cervical spine. No acute displaced fractures identified. Electronically Signed   By: Lucienne Capers M.D.   On: 04/24/2015 03:09   Ct Cervical Spine Wo Contrast  04/24/2015  CLINICAL DATA:  Patient fell while walking on a treadmill. EXAM: CT HEAD WITHOUT CONTRAST CT CERVICAL SPINE WITHOUT CONTRAST TECHNIQUE: Multidetector CT imaging of the head and cervical spine was performed following the standard protocol without intravenous contrast. Multiplanar CT image reconstructions of the cervical spine were also generated. COMPARISON:  CT head 06/24/2014.  MRI brain 07/05/2012. FINDINGS: CT HEAD FINDINGS Ventricles and sulci appear symmetrical. No ventricular dilatation. No significant atrophy. Mild patchy low-attenuation changes in the deep white matter suggesting mild small vessel ischemic change. No mass effect or midline shift. No abnormal extra-axial fluid collections. Gray-white matter junctions are distinct. Basal cisterns are not effaced. No evidence of acute intracranial hemorrhage. No depressed skull fractures. Mild mucosal thickening in the paranasal sinuses. Mastoid air cells are not opacified. Vascular  calcifications. CT CERVICAL SPINE FINDINGS Straightening of the usual cervical lordosis without anterior subluxation. This is likely due to degenerative change or patient positioning but ligamentous injury or muscle spasm could also have this appearance and are not excluded. Degenerative changes in the cervical spine with narrowed cervical interspaces and endplate hypertrophic changes most prominent at C5-6. Mild degenerative changes in the facet joints. Normal alignment of the facet joints. No vertebral compression deformities. No prevertebral soft tissue swelling. C1-2 articulation appears intact. No focal bone lesion or bone destruction. Vascular calcifications. Soft tissues are unremarkable. Calcified nodule in the right supraclavicular region probably represents a calcified lymph node. IMPRESSION: No acute intracranial abnormalities. Nonspecific straightening of usual cervical lordosis. Mild degenerative changes in the cervical spine. No acute displaced fractures identified. Electronically Signed   By: Lucienne Capers M.D.   On: 04/24/2015 03:09   Ct Femur Right Wo Contrast  04/24/2015  CLINICAL DATA:  Fall. Distal femoral fracture on right knee radiographs. EXAM: CT OF THE RIGHT FEMUR WITHOUT CONTRAST TECHNIQUE: Multidetector CT imaging was performed according to the standard protocol. Multiplanar CT image reconstructions were also generated. COMPARISON:  Right knee 04/24/2015 FINDINGS: There is a right total knee arthroplasty. Components appear grossly well-seated allowing for streak artifact. There is a mostly transverse comminuted fracture of the distal right femur at the meta diaphysis. There is about 13 mm posterior displacement of the distal fracture fragment with about 16 mm impaction of the distal fracture fragment into the proximal fragment. The fracture line itself does not definitely extend to the femoral prosthesis, however the anterior fractured margin of the proximal fracture fragment impacts  on to the superior aspect of the femoral prosthesis. Visualized portion of the proximal tibia and fibula appear intact. Right hip and proximal femur appear intact. Soft tissue infiltration around the area of the fracture consistent with hematoma. Vascular calcifications. IMPRESSION: Mostly transverse comminuted fracture of the right femoral meta diaphysis with impaction and posterior displacement of the distal fracture fragment. Electronically Signed   By: Lucienne Capers M.D.   On: 04/24/2015 03:29   Dg Knee Complete 4 Views Right  04/24/2015  CLINICAL DATA:  Fall off a treadmill yesterday, pain above the right knee. EXAM: RIGHT KNEE - COMPLETE 4+ VIEW COMPARISON:  None. FINDINGS: Right knee arthroplasty in expected alignment. There is a periprosthetic lucency of the distal femur are  with apex anterior angulation. Fracture extends to the anterior aspect of the femoral component. Ossific densities about the medial joint line are likely chronic. Ossific density projecting over Hoffa's fat pad on the lateral view, etiology uncertain, and may reflect these medial densities. Small joint effusion. IMPRESSION: Displaced periprosthetic fracture of the distal femur extending to the superior aspect of the femoral component of right knee arthroplasty. Electronically Signed   By: Jeb Levering M.D.   On: 04/24/2015 03:04   Dg Hip Unilat  With Pelvis 2-3 Views Right  04/24/2015  CLINICAL DATA:  Patient fell of the treadmill. Anterior hip pain radiating to the knee. EXAM: DG HIP (WITH OR WITHOUT PELVIS) 2-3V RIGHT COMPARISON:  05/29/2014 FINDINGS: Degenerative changes in both hips. No evidence of acute fracture or dislocation. Pelvis appears intact. SI joints and symphysis pubis are not displaced. Postoperative changes in the lower lumbar spine and anterior pelvis. IMPRESSION: Mild degenerative changes in the right hip. No acute bony abnormalities. Electronically Signed   By: Lucienne Capers M.D.   On: 04/24/2015  01:23   Dg Femur, Min 2 Views Right  04/24/2015  CLINICAL DATA:  Fall off a treadmill with right distal femur pain. EXAM: RIGHT FEMUR 2 VIEWS COMPARISON:  None. FINDINGS: Mildly displaced distal femur fracture with apex anterior angulation about the femoral component of total knee arthroplasty. The proximal femur is intact. Hip alignment is maintained. IMPRESSION: Displaced periprosthetic fracture of the distal femur extending to the femoral component of total knee arthroplasty. Proximal femur is intact. Electronically Signed   By: Jeb Levering M.D.   On: 04/24/2015 03:06    Review of Systems  Constitutional: Negative.   HENT: Negative.   Eyes: Negative.   Respiratory: Positive for shortness of breath. Negative for cough, hemoptysis, sputum production and wheezing.        SOB with exertion  Cardiovascular: Negative.   Gastrointestinal: Positive for heartburn. Negative for nausea, vomiting, abdominal pain, diarrhea, constipation, blood in stool and melena.  Genitourinary: Negative.   Musculoskeletal: Positive for myalgias, back pain, joint pain and falls.       Right knee pain  Skin: Negative.   Neurological: Negative.   Endo/Heme/Allergies: Negative for environmental allergies and polydipsia. Bruises/bleeds easily.  Psychiatric/Behavioral: Negative.    Blood pressure 145/57, pulse 88, temperature 98.9 F (37.2 C), temperature source Oral, resp. rate 18, height _0  (1.626 m), weight 117.8 kg (259 lb 11.2 oz), SpO2 100 %. Physical Exam  Constitutional: She is oriented to person, place, and time. She appears well-developed. No distress.  Morbidly obese  HENT:  Head: Normocephalic and atraumatic.  Right Ear: External ear normal.  Left Ear: External ear normal.  Nose: Nose normal.  Mouth/Throat: Oropharynx is clear and moist.  Eyes: Conjunctivae and EOM are normal.  Neck: Normal range of motion. Neck supple.  Cardiovascular: Normal rate, regular rhythm, normal heart sounds and  intact distal pulses.   No murmur heard. Respiratory: Effort normal and breath sounds normal. No respiratory distress. She has no wheezes.  GI: Soft. Bowel sounds are normal. She exhibits no distension. There is no tenderness.  Musculoskeletal:       Left hip: Normal.       Left knee: Normal.       Right ankle: Normal.       Left ankle: Normal.  Right leg internally rotated and shortened due to fracture at distal femur. Unable to examine right hip due to right knee. Patient in knee immobilizer.  Neurological: She  is alert and oriented to person, place, and time. She has normal strength. No sensory deficit.  Skin: No rash noted. She is not diaphoretic. No erythema.  Psychiatric: She has a normal mood and affect. Her behavior is normal.    Assessment/Plan: Displaced distal periprosthetic femur fracture, right She has a fracture surrounding the femoral component of her right total knee. She will remain NPO until we decide when her surgery will be. SCDs for VTE prophylaxis now. Hold aggrenox. Patient to remain in knee immobilizer and NWB on the right LE. Will remain on bed rest. Foley catheter already in place. Will discuss with Dr. Lyla Glassing to determine who will assuming her care.   The risks, benefits, and alternatives were discussed with the patient. There are risks associated with the surgery including, but not limited to, problems with anesthesia (death), infection, differences in leg length/angulation/rotation, fracture of bones, loosening or failure of implants, malunion, nonunion, hematoma (blood accumulation) which may require surgical drainage, blood clots, pulmonary embolism, nerve injury (foot drop), and blood vessel injury. The patient understands these risks and elects to proceed.   CONSTABLE, AMBER LAUREN 04/24/2015, 7:23 AM

## 2015-04-24 NOTE — Progress Notes (Signed)
Nutrition Brief Note  Patient identified via HF Protocol  Wt Readings from Last 15 Encounters:  04/24/15 259 lb 11.2 oz (117.8 kg)  10/08/14 245 lb (111.131 kg)  05/29/14 248 lb (112.492 kg)  10/17/13 252 lb (114.306 kg)  09/05/13 254 lb (115.214 kg)  01/11/12 252 lb (114.306 kg)  05/28/11 251 lb (113.853 kg)  06/29/10 248 lb (112.492 kg)  05/29/10 261 lb (118.389 kg)  05/05/10 262 lb (118.842 kg)  08/07/09 256 lb 6.4 oz (116.302 kg)  02/10/09 257 lb (116.574 kg)  10/02/08 245 lb (111.131 kg)  08/15/08 245 lb 11.2 oz (111.449 kg)  12/18/07 261 lb (118.389 kg)    Body mass index is 44.56 kg/(m^2). Patient meets criteria for Obese Class III based on current BMI.   Current diet order is carb modified, patient is consuming approximately Unknown% of meals at this time. Labs and medications reviewed.   No nutrition interventions warranted at this time. If nutrition issues arise, please consult RD.   Amber Anis. Dawnell Bryant, MS, RD LDN After Hours/Weekend Pager [phone]

## 2015-04-24 NOTE — H&P (Signed)
[facility] History and Physical  [name] [id number] DOB: January 16, 1940 DOA: 04/24/2015  Referring physician: EDP PCP: Leamon Arnt, MD   Chief Complaint: Fall, Knee pain   HPI: [name] is a 75 y.o. female with pmh of DM, HLD, HTN.  Patient presents to the ED after she fell while walking on a treadmill which was speeding up.  She suffered immediate severe R knee pain, deformity of the leg with shortening and internal rotation, and inability to walk.  Nothing makes pain better, movement makes pain worse.  Has tried nothing specific for pain.  Review of Systems: Systems reviewed.  As above, otherwise negative  Past Medical History  Diagnosis Date  . Diabetes mellitus   . Hyperlipidemia   . Hypertension   . History of transient ischemic attack (TIA)   . Thyroid carcinoma ([place])   . GERD (gastroesophageal reflux disease)   . OA (osteoarthritis)   . Asthma   . Anemia   . Chest pain, atypical    Past Surgical History  Procedure Laterality Date  . Cardiac catheterization  03/18/2009    NORMAL LEFT VENTRICULAR SIZE AND CONTRACTILITY WITH NORMAL SYSTOLIC  FUNCTION. EF 60%  . Cardiolite study      SHOWED A SIGNIFICANT REVERSIBLE ANTERIOR WALL DEFECT CONSISTENT WITH ISCHEMIA. EF 55%  . Thyroidectomy    . Back surgery    . Lumbar fusion    . Total knee arthroplasty      right   Social History:  reports that she has quit smoking. She has never used smokeless tobacco. She reports that she does not drink alcohol or use illicit drugs.  Allergies  Allergen Reactions  . Actos [Pioglitazone Hydrochloride] Swelling  . Ativan [Lorazepam]     swelling  . Cephalexin Other (See Comments)    Doesn't remember   . Oxycodone     This medication makes her feel sick  . Rosiglitazone     unknown  . Tramadol Other (See Comments)    hallucinations    Family History  Problem Relation Age of Onset  . Pneumonia Mother   . Heart attack Father      Prior to Admission  medications   Medication Sig Start Date End Date Taking? Authorizing Provider  acetaminophen (TYLENOL) 500 MG tablet Take 1,000 mg by mouth every 6 (six) hours as needed for mild pain.   Yes Historical Provider, MD  alendronate (FOSAMAX) 70 MG tablet Take 70 mg by mouth every 7 (seven) days. Monday. Take with a full glass of water on an empty stomach.   Yes Historical Provider, MD  amLODipine (NORVASC) 10 MG tablet Take 10 mg by mouth daily.   Yes Historical Provider, MD  atorvastatin (LIPITOR) 20 MG tablet Take 20 mg by mouth daily.   Yes Historical Provider, MD  calcitRIOL (ROCALTROL) 0.25 MCG capsule Take 0.5 mcg by mouth daily.   Yes Historical Provider, MD  calcium-vitamin D (OSCAL WITH D) 500-200 MG-UNIT per tablet Take 1 tablet by mouth daily with breakfast.   Yes Historical Provider, MD  dipyridamole-aspirin (AGGRENOX) 25-200 MG per 12 hr capsule Take 1 capsule by mouth 2 (two) times daily.    Yes Historical Provider, MD  fluticasone (FLONASE) 50 MCG/ACT nasal spray Place 1 spray into the nose daily as needed for allergies.  12/20/12  Yes Historical Provider, MD  hydrochlorothiazide 25 MG tablet Take 25 mg by mouth daily.     Yes Historical Provider, MD  HYDROcodone-acetaminophen (NORCO/VICODIN) 5-325 MG tablet Take  1 tablet by mouth daily as needed for moderate pain or severe pain.  03/14/15  Yes Historical Provider, MD  levothyroxine (SYNTHROID, LEVOTHROID) 137 MCG tablet Take 137 mcg by mouth daily before breakfast.   Yes Historical Provider, MD  lisinopril (PRINIVIL,ZESTRIL) 40 MG tablet Take 40 mg by mouth 2 (two) times daily.     Yes Historical Provider, MD  LYRICA 150 MG capsule Take 150 mg by mouth twice daily 09/26/14  Yes Historical Provider, MD  metFORMIN (GLUCOPHAGE) 1000 MG tablet Take 1,000 mg by mouth 2 (two) times daily.   Yes Historical Provider, MD  valACYclovir (VALTREX) 1000 MG tablet TAKE 1000 MG BY MOUTH EVERY 12 HOURS AS NEEDED FOR OUTBREAK ON DAY 1 07/05/14  Yes Historical  Provider, MD   Physical Exam: Filed Vitals:   04/24/15 0137 04/24/15 0327  BP: 133/62 121/76  Pulse: 82 82  Temp:    Resp: 18 18    BP 121/76 mmHg  Pulse 82  Temp(Src) 98.3 F (36.8 C) (Oral)  Resp 18  SpO2 100%  General Appearance:    Alert, oriented, no distress, appears stated age  Head:    Normocephalic, atraumatic  Eyes:    PERRL, EOMI, sclera non-icteric        Nose:   Nares without drainage or epistaxis. Mucosa, turbinates normal  Throat:   Moist mucous membranes. Oropharynx without erythema or exudate.  Neck:   Supple. No carotid bruits.  No thyromegaly.  No lymphadenopathy.   Back:     No CVA tenderness, no spinal tenderness  Lungs:     Clear to auscultation bilaterally, without wheezes, rhonchi or rales  Chest wall:    No tenderness to palpitation  Heart:    Regular rate and rhythm without murmurs, gallops, rubs  Abdomen:     Soft, non-tender, nondistended, normal bowel sounds, no organomegaly  Genitalia:    deferred  Rectal:    deferred  Extremities:   No clubbing, cyanosis or edema.  Pulses:   2+ and symmetric all extremities  Skin:   Skin color, texture, turgor normal, no rashes or lesions  Lymph nodes:   Cervical, supraclavicular, and axillary nodes normal  Neurologic:   CNII-XII intact. Normal strength, sensation and reflexes      throughout    Labs on Admission:  Basic Metabolic Panel:  Recent Labs Lab 04/24/15 0036  NA 142  K 4.2  CL 108  CO2 26  GLUCOSE 160*  BUN 27*  CREATININE 1.47*  CALCIUM 8.0*   Liver Function Tests: No results for input(s): AST, ALT, ALKPHOS, BILITOT, PROT, ALBUMIN in the last 168 hours. No results for input(s): LIPASE, AMYLASE in the last 168 hours. No results for input(s): AMMONIA in the last 168 hours. CBC:  Recent Labs Lab 04/24/15 0036  WBC 13.6*  NEUTROABS 11.0*  HGB 9.5*  HCT 29.4*  MCV 91.3  PLT 291   Cardiac Enzymes: No results for input(s): CKTOTAL, CKMB, CKMBINDEX, TROPONINI in the last 168  hours.  BNP (last 3 results) No results for input(s): PROBNP in the last 8760 hours. CBG:  Recent Labs Lab 04/24/15 0505  GLUCAP 135*    Radiological Exams on Admission: Dg Chest 2 View  04/24/2015  CLINICAL DATA:  Patient fell off a treadmill with right hip pain. EXAM: CHEST  2 VIEW COMPARISON:  06/24/2014 FINDINGS: Mild cardiac enlargement without vascular congestion. No focal airspace disease or consolidation in the lungs. No blunting of costophrenic angles. No pneumothorax. Calcified and tortuous aorta.  Degenerative changes in the spine and shoulders. IMPRESSION: Mild cardiac enlargement.  No evidence of active pulmonary disease. Electronically Signed   By: Lucienne Capers M.D.   On: 04/24/2015 01:25   Ct Head Wo Contrast  04/24/2015  CLINICAL DATA:  Patient fell while walking on a treadmill. EXAM: CT HEAD WITHOUT CONTRAST CT CERVICAL SPINE WITHOUT CONTRAST TECHNIQUE: Multidetector CT imaging of the head and cervical spine was performed following the standard protocol without intravenous contrast. Multiplanar CT image reconstructions of the cervical spine were also generated. COMPARISON:  CT head 06/24/2014.  MRI brain 07/05/2012. FINDINGS: CT HEAD FINDINGS Ventricles and sulci appear symmetrical. No ventricular dilatation. No significant atrophy. Mild patchy low-attenuation changes in the deep white matter suggesting mild small vessel ischemic change. No mass effect or midline shift. No abnormal extra-axial fluid collections. Gray-white matter junctions are distinct. Basal cisterns are not effaced. No evidence of acute intracranial hemorrhage. No depressed skull fractures. Mild mucosal thickening in the paranasal sinuses. Mastoid air cells are not opacified. Vascular calcifications. CT CERVICAL SPINE FINDINGS Straightening of the usual cervical lordosis without anterior subluxation. This is likely due to degenerative change or patient positioning but ligamentous injury or muscle spasm could  also have this appearance and are not excluded. Degenerative changes in the cervical spine with narrowed cervical interspaces and endplate hypertrophic changes most prominent at C5-6. Mild degenerative changes in the facet joints. Normal alignment of the facet joints. No vertebral compression deformities. No prevertebral soft tissue swelling. C1-2 articulation appears intact. No focal bone lesion or bone destruction. Vascular calcifications. Soft tissues are unremarkable. Calcified nodule in the right supraclavicular region probably represents a calcified lymph node. IMPRESSION: No acute intracranial abnormalities. Nonspecific straightening of usual cervical lordosis. Mild degenerative changes in the cervical spine. No acute displaced fractures identified. Electronically Signed   By: Lucienne Capers M.D.   On: 04/24/2015 03:09   Ct Cervical Spine Wo Contrast  04/24/2015  CLINICAL DATA:  Patient fell while walking on a treadmill. EXAM: CT HEAD WITHOUT CONTRAST CT CERVICAL SPINE WITHOUT CONTRAST TECHNIQUE: Multidetector CT imaging of the head and cervical spine was performed following the standard protocol without intravenous contrast. Multiplanar CT image reconstructions of the cervical spine were also generated. COMPARISON:  CT head 06/24/2014.  MRI brain 07/05/2012. FINDINGS: CT HEAD FINDINGS Ventricles and sulci appear symmetrical. No ventricular dilatation. No significant atrophy. Mild patchy low-attenuation changes in the deep white matter suggesting mild small vessel ischemic change. No mass effect or midline shift. No abnormal extra-axial fluid collections. Gray-white matter junctions are distinct. Basal cisterns are not effaced. No evidence of acute intracranial hemorrhage. No depressed skull fractures. Mild mucosal thickening in the paranasal sinuses. Mastoid air cells are not opacified. Vascular calcifications. CT CERVICAL SPINE FINDINGS Straightening of the usual cervical lordosis without anterior  subluxation. This is likely due to degenerative change or patient positioning but ligamentous injury or muscle spasm could also have this appearance and are not excluded. Degenerative changes in the cervical spine with narrowed cervical interspaces and endplate hypertrophic changes most prominent at C5-6. Mild degenerative changes in the facet joints. Normal alignment of the facet joints. No vertebral compression deformities. No prevertebral soft tissue swelling. C1-2 articulation appears intact. No focal bone lesion or bone destruction. Vascular calcifications. Soft tissues are unremarkable. Calcified nodule in the right supraclavicular region probably represents a calcified lymph node. IMPRESSION: No acute intracranial abnormalities. Nonspecific straightening of usual cervical lordosis. Mild degenerative changes in the cervical spine. No acute displaced fractures identified. Electronically  Signed   By: Lucienne Capers M.D.   On: 04/24/2015 03:09   Ct Femur Right Wo Contrast  04/24/2015  CLINICAL DATA:  Fall. Distal femoral fracture on right knee radiographs. EXAM: CT OF THE RIGHT FEMUR WITHOUT CONTRAST TECHNIQUE: Multidetector CT imaging was performed according to the standard protocol. Multiplanar CT image reconstructions were also generated. COMPARISON:  Right knee 04/24/2015 FINDINGS: There is a right total knee arthroplasty. Components appear grossly well-seated allowing for streak artifact. There is a mostly transverse comminuted fracture of the distal right femur at the meta diaphysis. There is about 13 mm posterior displacement of the distal fracture fragment with about 16 mm impaction of the distal fracture fragment into the proximal fragment. The fracture line itself does not definitely extend to the femoral prosthesis, however the anterior fractured margin of the proximal fracture fragment impacts on to the superior aspect of the femoral prosthesis. Visualized portion of the proximal tibia and fibula  appear intact. Right hip and proximal femur appear intact. Soft tissue infiltration around the area of the fracture consistent with hematoma. Vascular calcifications. IMPRESSION: Mostly transverse comminuted fracture of the right femoral meta diaphysis with impaction and posterior displacement of the distal fracture fragment. Electronically Signed   By: Lucienne Capers M.D.   On: 04/24/2015 03:29   Dg Knee Complete 4 Views Right  04/24/2015  CLINICAL DATA:  Fall off a treadmill yesterday, pain above the right knee. EXAM: RIGHT KNEE - COMPLETE 4+ VIEW COMPARISON:  None. FINDINGS: Right knee arthroplasty in expected alignment. There is a periprosthetic lucency of the distal femur are with apex anterior angulation. Fracture extends to the anterior aspect of the femoral component. Ossific densities about the medial joint line are likely chronic. Ossific density projecting over Hoffa's fat pad on the lateral view, etiology uncertain, and may reflect these medial densities. Small joint effusion. IMPRESSION: Displaced periprosthetic fracture of the distal femur extending to the superior aspect of the femoral component of right knee arthroplasty. Electronically Signed   By: Jeb Levering M.D.   On: 04/24/2015 03:04   Dg Hip Unilat  With Pelvis 2-3 Views Right  04/24/2015  CLINICAL DATA:  Patient fell of the treadmill. Anterior hip pain radiating to the knee. EXAM: DG HIP (WITH OR WITHOUT PELVIS) 2-3V RIGHT COMPARISON:  05/29/2014 FINDINGS: Degenerative changes in both hips. No evidence of acute fracture or dislocation. Pelvis appears intact. SI joints and symphysis pubis are not displaced. Postoperative changes in the lower lumbar spine and anterior pelvis. IMPRESSION: Mild degenerative changes in the right hip. No acute bony abnormalities. Electronically Signed   By: Lucienne Capers M.D.   On: 04/24/2015 01:23   Dg Femur, Min 2 Views Right  04/24/2015  CLINICAL DATA:  Fall off a treadmill with right distal  femur pain. EXAM: RIGHT FEMUR 2 VIEWS COMPARISON:  None. FINDINGS: Mildly displaced distal femur fracture with apex anterior angulation about the femoral component of total knee arthroplasty. The proximal femur is intact. Hip alignment is maintained. IMPRESSION: Displaced periprosthetic fracture of the distal femur extending to the femoral component of total knee arthroplasty. Proximal femur is intact. Electronically Signed   By: Jeb Levering M.D.   On: 04/24/2015 03:06    EKG: Independently reviewed.  Assessment/Plan Principal Problem:   Fracture, femur, distal, right, closed, initial encounter Active Problems:   DM2 (diabetes mellitus, type 2) (HCC)   HTN (hypertension)   1. R distal femur fracture - 1. Hip FX pathway 2. Admitting to [facility] per Dr.  Hewitt 3. Sounds like it may be one of his partners doing surgery due to this being a complicated injury given location and associated TKA. 4. NPO for possible surgery 2. DM2 - 1. Holding home metformin 2. Sensitive scale SSI q4h while NPO 3. HTN - 1. Continue home meds except HCTZ 4. DVT ppx - SCDs, holding her home argatroban that she was on through yesterday, needs more formal DVT ppx addressed after surgery  Dr. Doran Durand consulted, he or partner will see patient tomorrow.  Code Status: Full  Family Communication: Daughter at bedside Disposition Plan: Admit to inpatient   Time spent: 73 min  GARDNER, JARED M. [facility] Pager [phone]  If 7AM-7PM, please contact the day team taking care of the patient [email or web address] Password [facility] 04/24/2015, 5:16 AM

## 2015-04-25 ENCOUNTER — Inpatient Hospital Stay (HOSPITAL_COMMUNITY): Payer: Medicare Other

## 2015-04-25 ENCOUNTER — Encounter (HOSPITAL_COMMUNITY): Payer: Self-pay | Admitting: Orthopedic Surgery

## 2015-04-25 ENCOUNTER — Inpatient Hospital Stay (HOSPITAL_COMMUNITY): Payer: Medicare Other | Admitting: Anesthesiology

## 2015-04-25 ENCOUNTER — Encounter (HOSPITAL_COMMUNITY)
Admission: EM | Disposition: A | Discharge status: Skilled Nursing Facility | Payer: Self-pay | Source: Home / Self Care | Attending: Internal Medicine

## 2015-04-25 DIAGNOSIS — M9711XA Periprosthetic fracture around internal prosthetic right knee joint, initial encounter: Secondary | ICD-10-CM

## 2015-04-25 HISTORY — PX: FEMUR IM NAIL: SHX1597

## 2015-04-25 LAB — GLUCOSE, CAPILLARY
GLUCOSE-CAPILLARY: 161 mg/dL — AB (ref 65–99)
GLUCOSE-CAPILLARY: 186 mg/dL — AB (ref 65–99)
GLUCOSE-CAPILLARY: 170 mg/dL — AB (ref 65–99)
Glucose-Capillary: 134 mg/dL — ABNORMAL HIGH (ref 65–99)

## 2015-04-25 LAB — PREPARE RBC (CROSSMATCH)

## 2015-04-25 SURGERY — INSERTION, INTRAMEDULLARY ROD, FEMUR, RETROGRADE
Anesthesia: General | Site: Leg Upper | Laterality: Right

## 2015-04-25 MED ORDER — CLINDAMYCIN PHOSPHATE 900 MG/50ML IV SOLN
INTRAVENOUS | Status: AC
  Filled 2015-04-25: qty 50

## 2015-04-25 MED ORDER — ISOPROPYL ALCOHOL 70 % SOLN
Status: AC
  Filled 2015-04-25: qty 480

## 2015-04-25 MED ORDER — ONDANSETRON HCL 4 MG/2ML IJ SOLN
INTRAMUSCULAR | Status: AC
  Filled 2015-04-25: qty 2

## 2015-04-25 MED ORDER — ACETAMINOPHEN 650 MG RE SUPP: 650.0000 mg | Freq: Four times a day (QID) | RECTAL | Status: DC | PRN

## 2015-04-25 MED ORDER — PHENYLEPHRINE HCL 10 MG/ML IJ SOLN
INTRAMUSCULAR | Status: DC | PRN
  Administered 2015-04-25: 80 ug via INTRAVENOUS

## 2015-04-25 MED ORDER — FENTANYL CITRATE (PF) 250 MCG/5ML IJ SOLN
INTRAMUSCULAR | Status: AC
Start: 2015-04-25 — End: 2015-04-25
  Filled 2015-04-25: qty 5

## 2015-04-25 MED ORDER — MENTHOL 3 MG MT LOZG: 1.0000 | LOZENGE | OROMUCOSAL | Status: DC | PRN

## 2015-04-25 MED ORDER — HYDROGEN PEROXIDE 3 % EX SOLN
CUTANEOUS | Status: AC
  Filled 2015-04-25: qty 473

## 2015-04-25 MED ORDER — HYDROGEN PEROXIDE 3 % EX SOLN
CUTANEOUS | Status: DC | PRN
  Administered 2015-04-25: 1

## 2015-04-25 MED ORDER — LIDOCAINE HCL (CARDIAC) 20 MG/ML IV SOLN
INTRAVENOUS | Status: AC
  Filled 2015-04-25: qty 5

## 2015-04-25 MED ORDER — HYDROMORPHONE HCL 1 MG/ML IJ SOLN
INTRAMUSCULAR | Status: AC
  Filled 2015-04-25: qty 1

## 2015-04-25 MED ORDER — ACETAMINOPHEN 325 MG PO TABS
650.0000 mg | ORAL_TABLET | Freq: Four times a day (QID) | ORAL | Status: DC | PRN
  Administered 2015-04-26 – 2015-04-28 (×6): 650 mg via ORAL
  Filled 2015-04-25 (×7): qty 2

## 2015-04-25 MED ORDER — SUGAMMADEX SODIUM 200 MG/2ML IV SOLN
INTRAVENOUS | Status: DC | PRN
  Administered 2015-04-25: 200 mg via INTRAVENOUS

## 2015-04-25 MED ORDER — 0.9 % SODIUM CHLORIDE (POUR BTL) OPTIME
TOPICAL | Status: DC | PRN
Start: 2015-04-25 — End: 2015-04-25
  Administered 2015-04-25: 1000 mL

## 2015-04-25 MED ORDER — HYDROCODONE-ACETAMINOPHEN 5-325 MG PO TABS
1.0000 | ORAL_TABLET | Freq: Four times a day (QID) | ORAL | Status: DC | PRN
  Administered 2015-04-25: 2 via ORAL
  Administered 2015-04-25: 1 via ORAL
  Administered 2015-04-26: 2 via ORAL
  Filled 2015-04-25 (×3): qty 2

## 2015-04-25 MED ORDER — DOCUSATE SODIUM 100 MG PO CAPS
100.0000 mg | ORAL_CAPSULE | Freq: Two times a day (BID) | ORAL | Status: DC
  Administered 2015-04-25 – 2015-04-29 (×8): 100 mg via ORAL

## 2015-04-25 MED ORDER — LIDOCAINE HCL (CARDIAC) 20 MG/ML IV SOLN
INTRAVENOUS | Status: DC | PRN
  Administered 2015-04-25: 60 mg via INTRAVENOUS

## 2015-04-25 MED ORDER — FENTANYL CITRATE (PF) 100 MCG/2ML IJ SOLN
INTRAMUSCULAR | Status: AC
  Filled 2015-04-25: qty 2

## 2015-04-25 MED ORDER — GLYCOPYRROLATE 0.2 MG/ML IJ SOLN
INTRAMUSCULAR | Status: AC
  Filled 2015-04-25: qty 3

## 2015-04-25 MED ORDER — ASPIRIN-DIPYRIDAMOLE ER 25-200 MG PO CP12
1.0000 | ORAL_CAPSULE | Freq: Two times a day (BID) | ORAL | Status: DC
  Filled 2015-04-25: qty 1

## 2015-04-25 MED ORDER — PHENOL 1.4 % MT LIQD
1.0000 | OROMUCOSAL | Status: DC | PRN
Start: 2015-04-25 — End: 2015-04-29
  Filled 2015-04-25: qty 177

## 2015-04-25 MED ORDER — CLINDAMYCIN PHOSPHATE 900 MG/50ML IV SOLN
900.0000 mg | INTRAVENOUS | Status: AC
  Administered 2015-04-25: 900 mg via INTRAVENOUS

## 2015-04-25 MED ORDER — SENNA 8.6 MG PO TABS
1.0000 | ORAL_TABLET | Freq: Two times a day (BID) | ORAL | Status: DC
  Administered 2015-04-25 – 2015-04-29 (×8): 8.6 mg via ORAL

## 2015-04-25 MED ORDER — CLINDAMYCIN PHOSPHATE 600 MG/50ML IV SOLN
600.0000 mg | Freq: Four times a day (QID) | INTRAVENOUS | Status: AC
  Administered 2015-04-25 (×2): 600 mg via INTRAVENOUS
  Filled 2015-04-25 (×2): qty 50

## 2015-04-25 MED ORDER — PROPOFOL 10 MG/ML IV BOLUS
INTRAVENOUS | Status: DC | PRN
Start: 2015-04-25 — End: 2015-04-25
  Administered 2015-04-25: 80 mg via INTRAVENOUS

## 2015-04-25 MED ORDER — LACTATED RINGERS IV SOLN
INTRAVENOUS | Status: DC | PRN
  Administered 2015-04-25 (×2): via INTRAVENOUS

## 2015-04-25 MED ORDER — DEXTROSE 5 % IV SOLN
500.0000 mg | Freq: Four times a day (QID) | INTRAVENOUS | Status: DC | PRN
  Administered 2015-04-25: 500 mg via INTRAVENOUS
  Filled 2015-04-25 (×2): qty 5

## 2015-04-25 MED ORDER — MORPHINE SULFATE (PF) 10 MG/ML IV SOLN: 0.5000 mg | INTRAVENOUS | Status: DC | PRN

## 2015-04-25 MED ORDER — ASPIRIN-DIPYRIDAMOLE ER 25-200 MG PO CP12
1.0000 | ORAL_CAPSULE | Freq: Two times a day (BID) | ORAL | Status: DC
  Administered 2015-04-26 – 2015-04-29 (×7): 1 via ORAL
  Filled 2015-04-25 (×8): qty 1

## 2015-04-25 MED ORDER — HYDROMORPHONE HCL 1 MG/ML IJ SOLN
0.2500 mg | INTRAMUSCULAR | Status: DC | PRN
  Administered 2015-04-25 (×4): 0.5 mg via INTRAVENOUS

## 2015-04-25 MED ORDER — ONDANSETRON HCL 4 MG/2ML IJ SOLN: 4.0000 mg | Freq: Four times a day (QID) | INTRAMUSCULAR | Status: DC | PRN

## 2015-04-25 MED ORDER — PROPOFOL 10 MG/ML IV BOLUS
INTRAVENOUS | Status: AC
  Filled 2015-04-25: qty 20

## 2015-04-25 MED ORDER — FENTANYL CITRATE (PF) 100 MCG/2ML IJ SOLN
INTRAMUSCULAR | Status: DC | PRN
  Administered 2015-04-25 (×3): 25 ug via INTRAVENOUS

## 2015-04-25 MED ORDER — NEOSTIGMINE METHYLSULFATE 10 MG/10ML IV SOLN
INTRAVENOUS | Status: AC
  Filled 2015-04-25: qty 1

## 2015-04-25 MED ORDER — MIDAZOLAM HCL 2 MG/2ML IJ SOLN
INTRAMUSCULAR | Status: AC
  Filled 2015-04-25: qty 2

## 2015-04-25 MED ORDER — MORPHINE SULFATE (PF) 2 MG/ML IV SOLN
0.5000 mg | INTRAVENOUS | Status: DC | PRN
  Administered 2015-04-25 – 2015-04-26 (×4): 0.5 mg via INTRAVENOUS
  Filled 2015-04-25 (×4): qty 1

## 2015-04-25 MED ORDER — METOCLOPRAMIDE HCL 10 MG PO TABS: 5.0000 mg | ORAL_TABLET | Freq: Three times a day (TID) | ORAL | Status: DC | PRN

## 2015-04-25 MED ORDER — FENTANYL CITRATE (PF) 250 MCG/5ML IJ SOLN
INTRAMUSCULAR | Status: DC | PRN
  Administered 2015-04-25: 25 ug via INTRAVENOUS
  Administered 2015-04-25 (×2): 50 ug via INTRAVENOUS
  Administered 2015-04-25 (×2): 25 ug via INTRAVENOUS
  Administered 2015-04-25: 50 ug via INTRAVENOUS
  Administered 2015-04-25: 25 ug via INTRAVENOUS

## 2015-04-25 MED ORDER — ONDANSETRON HCL 4 MG PO TABS: 4.0000 mg | ORAL_TABLET | Freq: Four times a day (QID) | ORAL | Status: DC | PRN

## 2015-04-25 MED ORDER — SODIUM CHLORIDE 0.9 % IR SOLN
Status: DC | PRN
  Administered 2015-04-25: 3000 mL
  Administered 2015-04-25: 1000 mL

## 2015-04-25 MED ORDER — VANCOMYCIN HCL 10 G IV SOLR
1500.0000 mg | INTRAVENOUS | Status: AC
  Administered 2015-04-25: 1500 mg via INTRAVENOUS
  Filled 2015-04-25: qty 1500

## 2015-04-25 MED ORDER — METHOCARBAMOL 500 MG PO TABS
500.0000 mg | ORAL_TABLET | Freq: Four times a day (QID) | ORAL | Status: DC | PRN
  Administered 2015-04-26 – 2015-04-29 (×7): 500 mg via ORAL
  Filled 2015-04-25 (×8): qty 1

## 2015-04-25 MED ORDER — ASPIRIN 81 MG PO CHEW
81.0000 mg | CHEWABLE_TABLET | Freq: Two times a day (BID) | ORAL | Status: DC
  Administered 2015-04-26 – 2015-04-29 (×7): 81 mg via ORAL
  Filled 2015-04-25 (×8): qty 1

## 2015-04-25 MED ORDER — ONDANSETRON HCL 4 MG/2ML IJ SOLN
INTRAMUSCULAR | Status: DC | PRN
  Administered 2015-04-25: 4 mg via INTRAVENOUS

## 2015-04-25 MED ORDER — ROCURONIUM BROMIDE 100 MG/10ML IV SOLN
INTRAVENOUS | Status: DC | PRN
  Administered 2015-04-25: 50 mg via INTRAVENOUS

## 2015-04-25 MED ORDER — SODIUM CHLORIDE 0.9 % IV SOLN
Freq: Once | INTRAVENOUS | Status: AC
  Administered 2015-04-25: 09:00:00 via INTRAVENOUS

## 2015-04-25 MED ORDER — METOCLOPRAMIDE HCL 5 MG/ML IJ SOLN: 5.0000 mg | Freq: Three times a day (TID) | INTRAMUSCULAR | Status: DC | PRN

## 2015-04-25 SURGICAL SUPPLY — 41 items
BANDAGE ACE 6X5 VEL STRL LF (GAUZE/BANDAGES/DRESSINGS) ×2 IMPLANT
BIT DRILL CALIBRATED 4.3MMX365 (DRILL) IMPLANT
BIT DRILL CROWE PNT TWST 4.5MM (DRILL) IMPLANT
CHLORAPREP W/TINT 26ML (MISCELLANEOUS) ×2 IMPLANT
CUFF TOURN SGL QUICK 34 (TOURNIQUET CUFF) ×2
CUFF TRNQT CYL 34X4X40X1 (TOURNIQUET CUFF) IMPLANT
DRAPE C-ARM 42X120 X-RAY (DRAPES) ×1 IMPLANT
DRAPE C-ARMOR (DRAPES) ×1 IMPLANT
DRAPE INCISE IOBAN 66X45 STRL (DRAPES) ×1 IMPLANT
DRAPE INCISE IOBAN 85X60 (DRAPES) ×1 IMPLANT
DRAPE LG THREE QUARTER DISP (DRAPES) ×2 IMPLANT
DRAPE ORTHO SPLIT 77X108 STRL (DRAPES) ×4
DRAPE SURG ORHT 6 SPLT 77X108 (DRAPES) ×2 IMPLANT
DRAPE U-SHAPE 47X51 STRL (DRAPES) ×3 IMPLANT
DRILL CALIBRATED 4.3MMX365 (DRILL) ×2
DRILL CROWE POINT TWIST 4.5MM (DRILL) ×2
DRSG AQUACEL AG ADV 3.5X14 (GAUZE/BANDAGES/DRESSINGS) ×1 IMPLANT
DRSG MEPILEX BORDER 4X4 (GAUZE/BANDAGES/DRESSINGS) ×3 IMPLANT
ELECT PENCIL ROCKER SW 15FT (MISCELLANEOUS) ×2 IMPLANT
ELECT REM PT RETURN 9FT ADLT (ELECTROSURGICAL) ×2
ELECTRODE REM PT RTRN 9FT ADLT (ELECTROSURGICAL) ×1 IMPLANT
GAUZE SPONGE 4X4 12PLY STRL (GAUZE/BANDAGES/DRESSINGS) ×3 IMPLANT
GLOVE BIO SURGEON STRL SZ8.5 (GLOVE) ×5 IMPLANT
GOWN SPEC L3 XXLG W/TWL (GOWN DISPOSABLE) ×2 IMPLANT
GUIDEPIN 3.2X17.5 THRD DISP (PIN) ×1 IMPLANT
GUIDEWIRE BEAD TIP (WIRE) ×1 IMPLANT
HANDPIECE INTERPULSE COAX TIP (DISPOSABLE) ×2
LIQUID BAND (GAUZE/BANDAGES/DRESSINGS) ×2 IMPLANT
MANIFOLD NEPTUNE II (INSTRUMENTS) ×2 IMPLANT
NAIL FEM RETRO 10.5X360 (Nail) ×1 IMPLANT
PACK TOTAL KNEE CUSTOM (KITS) ×1 IMPLANT
POSITIONER SURGICAL ARM (MISCELLANEOUS) ×2 IMPLANT
SCREW CORT TI DBL LEAD 5X30 (Screw) ×1 IMPLANT
SCREW CORT TI DBL LEAD 5X56 (Screw) ×1 IMPLANT
SCREW CORT TI DBL LEAD 5X75 (Screw) ×1 IMPLANT
SCREW CORT TI DBL LEAD 5X80 (Screw) ×1 IMPLANT
SEALER BIPOLAR AQUA 6.0 (INSTRUMENTS) ×1 IMPLANT
SET HNDPC FAN SPRY TIP SCT (DISPOSABLE) IMPLANT
SUT VLOC 180 0 24IN GS25 (SUTURE) ×1 IMPLANT
TOWEL OR 17X26 10 PK STRL BLUE (TOWEL DISPOSABLE) ×4 IMPLANT
WRAP KNEE MAXI GEL POST OP (GAUZE/BANDAGES/DRESSINGS) ×1 IMPLANT

## 2015-04-25 NOTE — Anesthesia Preprocedure Evaluation (Addendum)
Anesthesia Evaluation  Patient identified by MRN, date of birth, ID band Patient awake    Reviewed: Allergy & Precautions, NPO status , Patient's Chart, lab work & pertinent test results  Airway Mallampati: II  TM Distance: <3 FB Neck ROM: Full    Dental no notable dental hx. (+) Dental Advisory Given   Pulmonary neg pulmonary ROS, former smoker,    breath sounds clear to auscultation + decreased breath sounds      Cardiovascular hypertension, Pt. on medications Normal cardiovascular exam Rhythm:Regular Rate:Normal     Neuro/Psych TIAnegative psych ROS   GI/Hepatic negative GI ROS, Neg liver ROS,   Endo/Other  diabetesHypothyroidism Morbid obesity  Renal/GU negative Renal ROS  negative genitourinary   Musculoskeletal negative musculoskeletal ROS (+)   Abdominal (+) + obese,   Peds negative pediatric ROS (+)  Hematology  (+) anemia ,   Anesthesia Other Findings   Reproductive/Obstetrics negative OB ROS                           Anesthesia Physical Anesthesia Plan  ASA: III  Anesthesia Plan: General   Post-op Pain Management:    Induction: Intravenous  Airway Management Planned: Oral ETT  Additional Equipment:   Intra-op Plan:   Post-operative Plan: Extubation in OR  Informed Consent: I have reviewed the patients History and Physical, chart, labs and discussed the procedure including the risks, benefits and alternatives for the proposed anesthesia with the patient or authorized representative who has indicated his/her understanding and acceptance.   Dental advisory given  Plan Discussed with: CRNA and Surgeon  Anesthesia Plan Comments:         Anesthesia Quick Evaluation

## 2015-04-25 NOTE — Anesthesia Procedure Notes (Addendum)
Procedure Name: Intubation Date/Time: 04/25/2015 8:27 AM Performed by: [name] Pre-anesthesia Checklist: Patient identified, Timeout performed, Emergency Drugs available, Suction available and Patient being monitored Patient Re-evaluated:Patient Re-evaluated prior to inductionOxygen Delivery Method: Circle system utilized Preoxygenation: Pre-oxygenation with 100% oxygen Intubation Type: IV induction Ventilation: Mask ventilation without difficulty Laryngoscope Size: Miller and 2 Tube type: Oral Tube size: 7.0 mm Number of attempts: 1 Airway Equipment and Method: Stylet Placement Confirmation: ETT inserted through vocal cords under direct vision,  positive ETCO2 and breath sounds checked- equal and bilateral Secured at: 21 cm Tube secured with: Tape Dental Injury: Teeth and Oropharynx as per pre-operative assessment  Comments: IV induction Rose--- intubation AM atraumatic---   Teeth and mouth as preop- bilat BS Rose

## 2015-04-25 NOTE — Op Note (Signed)
[name], [name] NO.:  [id number]  MEDICAL RECORD NO.:  [id number]  LOCATION:  [facility]                         FACILITY:  [facility]  PHYSICIAN:  Rod Can, MD     DATE OF BIRTH:  14-Jul-1940  DATE OF PROCEDURE:  04/25/2015 DATE OF DISCHARGE:                              OPERATIVE REPORT   PREOPERATIVE DIAGNOSES: 1. Morbid obesity. 2. Right periprosthetic distal femur fracture.  POSTOPERATIVE DIAGNOSES: 1. Morbid obesity. 2. Right periprosthetic distal femur fracture.  PROCEDURE PERFORMED:  Intramedullary fixation of right periprosthetic distal femur fracture.  IMPLANTS: 1. Biomet [place] retrograde nail, 10.5 x 360 mm. 2. 5.0-mm distal interlocking screw x3. 3. 5.0-mm proximal interlocking screw x1.  SURGEON:  Rod Can, MD.  ASSISTANT:  None.  EBL:  300 mL.  BLOOD PRODUCTS:  1 unit packed red blood cells.  DRAINS:  None.  SPECIMENS:  None.  DISPOSITION:  Stable to PACU.  INDICATIONS:  The patient is a 75 year old female, who was exercising on a treadmill when she fell onto her right knee.  She had knee pain, deformity, inability to weight bear.  She came to the emergency department where x-rays revealed a supracondylar periprosthetic distal femur fracture.  The fracture stayed proximal to the anterior flange. She was admitted to the Hospitalist Service.  She underwent perioperative risk stratification, medical optimization.  Risks, benefits and alternatives to surgical fixation were explained and she elected to proceed.  DESCRIPTION OF PROCEDURE IN DETAIL:  I identified the patient in the holding area using two identifiers.  I marked the surgical site.  She was taken to the operating room.  General anesthesia was induced on her bed.  She was then transferred to the operating room table.  Bump was placed under the right hip.  All bony prominences were well padded.  The right lower extremity was prepped and draped in the normal  sterile surgical fashion.  Time-out was called verifying the side and site of surgery.  I began by placing a triangle underneath her knee.  The fracture was reduced with traction and appropriate rotation and a varus- valgus force.  Provisional fluoroscopy views revealed that the reduction was very close.  I began the surgery by using a #10 blade to sharply excise her previous anterior knee incision.  I created full-thickness skin flaps.  I came down to the extensor mechanism.  I made a standard medial parapatellar arthrotomy.  I evacuated the fracture hematoma.  She did have copious scar tissue in the medial and lateral gutters that made mobilization of the patella very difficult.  I brought the knee into full extension and I performed a total synovectomy of the medial and lateral gutters.  Through the suprapatellar pouch, I was able to identify her fracture.  The distal fragment was impaled on the proximal fragment.  I pull traction.  I placed a Soil scientist within the fracture site and I was able to bring the distal piece anterior.  I reduced the fracture visually and I placed the large lobster claw clamp. I performed a minimal medial release. Then I used AP and lateral fluoroscopy views to determine the standard starting point for retrograde femoral nail using  a guidepin.  I then used the entry reamer. I placed the guidewire down to the proximal aspect of the lesser trochanter.  I reamed sequentially up to a 12.0-mm reamer with good chatter.  I then measured the length of the nail using the calibrated sheath.  I therefore selected a 10.5 x 360-mm nail.  This was placed onto the jig, the nail was gently impacted into place without any difficulty.  I removed the guidewire.  I then placed two transverse locking screws distal to medial, this was done after countersinking the nail about 1 cm.  I then placed the lateral-to-medial oblique screw. The reduction was anatomic.  The hardware  placement was appropriate.  I tightened the sleeve thus locking the screws.  I removed the jig.  I then brought the knee out into full extension.  Using perfect circle technique, I placed one proximal interlocking screw.  Final AP and lateral fluoroscopy views were used to confirm fracture, reduction and hardware placement.  I then turned my attention to her knee.  I thoroughly examined her polyethylene liner.  There was no damage from the nailing procedure.  Post was completely intact.  There was no polyethylene oxidation or wear.  Therefore, I elected not to perform a polyethylene liner exchange.  I copiously irrigated the wounds with dilute Betadine solution followed by saline with pulse lavage. Meticulous hemostasis was achieved with Bovie electrocautery and the Aquamantys bipolar sealer.  I closed the arthrotomy with #1 and 0 V-Loc sutures.  The deep dermal layer was closed with 2-0 interrupted Monocryl sutures.  Skin was reapproximated with staples.  I placed skin glue over the staples.  Once the glue was fully hardened, Aquacel dressing was applied.  Ace wrap was then applied followed by ice pack.  The patient was then transferred to her bed, extubated, taken to the PACU in stable condition.  Sponge, needle, and instrument counts were correct at the end of the case x2.  There were no known complications.  We will readmit the patient to the Hospitalist.  She may be touchdown weightbearing to the right lower extremity.  She will receive physical therapy for transfer training as well as range of motion to the right knee.  We will resume her Aggrenox and also place her on aspirin.  She will need disposition planning.  I will see her in the office 2 weeks after discharge.          ______________________________ Rod Can, MD     BS/MEDQ  D:  04/25/2015  T:  04/25/2015  Job:  [id number]

## 2015-04-25 NOTE — H&P (View-Only) (Signed)
Reason for Consult: right distal femur periprosthetic fracture Referring Physician: [facility]  [name] is an 75 y.o. female.  HPI: The patient is a 75 year old female who presents with the chief complaint of right knee pain. She reports that she was walking on the treadmill at home last night when she accidentally increased with speed, causing her to fall. She denies hitting her head or LOC. She reports immediate pain in her right leg and inability to WB. She was admitted by medicine last night, foley put in place, placed in a knee immobilizer. Patient has a history of DM, HTN, and TIA. She has not eaten since Wednesday night, which is also the last time she had a dose of aggrenox. Dr. Theda Sers replaced her right knee originally in 2003.   Past Medical History  Diagnosis Date  . Diabetes mellitus   . Hyperlipidemia   . Hypertension   . History of transient ischemic attack (TIA)   . Thyroid carcinoma ([place])   . GERD (gastroesophageal reflux disease)   . OA (osteoarthritis)   . Asthma   . Anemia   . Chest pain, atypical     Past Surgical History  Procedure Laterality Date  . Cardiac catheterization  03/18/2009    NORMAL LEFT VENTRICULAR SIZE AND CONTRACTILITY WITH NORMAL SYSTOLIC  FUNCTION. EF 60%  . Cardiolite study      SHOWED A SIGNIFICANT REVERSIBLE ANTERIOR WALL DEFECT CONSISTENT WITH ISCHEMIA. EF 55%  . Thyroidectomy    . Back surgery    . Lumbar fusion    . Total knee arthroplasty      right    Family History  Problem Relation Age of Onset  . Pneumonia Mother   . Heart attack Father     Social History:  reports that she has quit smoking. She has never used smokeless tobacco. She reports that she does not drink alcohol or use illicit drugs.  Allergies:  Allergies  Allergen Reactions  . Actos [Pioglitazone Hydrochloride] Swelling  . Ativan [Lorazepam]     swelling  . Cephalexin Other (See Comments)    Doesn't remember   . Oxycodone     This medication  makes her feel sick  . Rosiglitazone     unknown  . Tramadol Other (See Comments)    hallucinations    Medications: I have reviewed the patient's current medications.  Results for orders placed or performed during the hospital encounter of 04/24/15 (from the past 48 hour(s))  Basic metabolic panel     Status: Abnormal   Collection Time: 04/24/15 12:36 AM  Result Value Ref Range   Sodium 142 135 - 145 mmol/L   Potassium 4.2 3.5 - 5.1 mmol/L   Chloride 108 101 - 111 mmol/L   CO2 26 22 - 32 mmol/L   Glucose, Bld 160 (H) 65 - 99 mg/dL   BUN 27 (H) 6 - 20 mg/dL   Creatinine, Ser 1.47 (H) 0.44 - 1.00 mg/dL   Calcium 8.0 (L) 8.9 - 10.3 mg/dL   GFR calc non Af Amer 33 (L) >60 mL/min   GFR calc Af Amer 39 (L) >60 mL/min    Comment: (NOTE) The eGFR has been calculated using the CKD EPI equation. This calculation has not been validated in all clinical situations. eGFR's persistently <60 mL/min signify possible Chronic Kidney Disease.    Anion gap 8 5 - 15  CBC WITH DIFFERENTIAL     Status: Abnormal   Collection Time: 04/24/15 12:36 AM  Result Value Ref Range   WBC 13.6 (H) 4.0 - 10.5 K/uL   RBC 3.22 (L) 3.87 - 5.11 MIL/uL   Hemoglobin 9.5 (L) 12.0 - 15.0 g/dL   HCT 71.3 (L) 73.4 - 56.3 %   MCV 91.3 78.0 - 100.0 fL   MCH 29.5 26.0 - 34.0 pg   MCHC 32.3 30.0 - 36.0 g/dL   RDW 46.1 71.8 - 54.1 %   Platelets 291 150 - 400 K/uL   Neutrophils Relative % 80 %   Neutro Abs 11.0 (H) 1.7 - 7.7 K/uL   Lymphocytes Relative 12 %   Lymphs Abs 1.6 0.7 - 4.0 K/uL   Monocytes Relative 6 %   Monocytes Absolute 0.8 0.1 - 1.0 K/uL   Eosinophils Relative 2 %   Eosinophils Absolute 0.2 0.0 - 0.7 K/uL   Basophils Relative 0 %   Basophils Absolute 0.0 0.0 - 0.1 K/uL  Protime-INR     Status: None   Collection Time: 04/24/15 12:36 AM  Result Value Ref Range   Prothrombin Time 12.5 11.6 - 15.2 seconds   INR 0.95 0.00 - 1.49  Type and screen [facility]     Status: None    Collection Time: 04/24/15 12:37 AM  Result Value Ref Range   ABO/RH(D) AB POS    Antibody Screen NEG    Sample Expiration 04/27/2015   CBG monitoring, ED     Status: Abnormal   Collection Time: 04/24/15  5:05 AM  Result Value Ref Range   Glucose-Capillary 135 (H) 65 - 99 mg/dL    Dg Chest 2 View  06/03/6036  CLINICAL DATA:  Patient fell off a treadmill with right hip pain. EXAM: CHEST  2 VIEW COMPARISON:  06/24/2014 FINDINGS: Mild cardiac enlargement without vascular congestion. No focal airspace disease or consolidation in the lungs. No blunting of costophrenic angles. No pneumothorax. Calcified and tortuous aorta. Degenerative changes in the spine and shoulders. IMPRESSION: Mild cardiac enlargement.  No evidence of active pulmonary disease. Electronically Signed   By: Burman Nieves M.D.   On: 04/24/2015 01:25   Ct Head Wo Contrast  04/24/2015  CLINICAL DATA:  Patient fell while walking on a treadmill. EXAM: CT HEAD WITHOUT CONTRAST CT CERVICAL SPINE WITHOUT CONTRAST TECHNIQUE: Multidetector CT imaging of the head and cervical spine was performed following the standard protocol without intravenous contrast. Multiplanar CT image reconstructions of the cervical spine were also generated. COMPARISON:  CT head 06/24/2014.  MRI brain 07/05/2012. FINDINGS: CT HEAD FINDINGS Ventricles and sulci appear symmetrical. No ventricular dilatation. No significant atrophy. Mild patchy low-attenuation changes in the deep white matter suggesting mild small vessel ischemic change. No mass effect or midline shift. No abnormal extra-axial fluid collections. Gray-white matter junctions are distinct. Basal cisterns are not effaced. No evidence of acute intracranial hemorrhage. No depressed skull fractures. Mild mucosal thickening in the paranasal sinuses. Mastoid air cells are not opacified. Vascular calcifications. CT CERVICAL SPINE FINDINGS Straightening of the usual cervical lordosis without anterior subluxation.  This is likely due to degenerative change or patient positioning but ligamentous injury or muscle spasm could also have this appearance and are not excluded. Degenerative changes in the cervical spine with narrowed cervical interspaces and endplate hypertrophic changes most prominent at C5-6. Mild degenerative changes in the facet joints. Normal alignment of the facet joints. No vertebral compression deformities. No prevertebral soft tissue swelling. C1-2 articulation appears intact. No focal bone lesion or bone destruction. Vascular calcifications. Soft tissues are unremarkable. Calcified  nodule in the right supraclavicular region probably represents a calcified lymph node. IMPRESSION: No acute intracranial abnormalities. Nonspecific straightening of usual cervical lordosis. Mild degenerative changes in the cervical spine. No acute displaced fractures identified. Electronically Signed   By: Lucienne Capers M.D.   On: 04/24/2015 03:09   Ct Cervical Spine Wo Contrast  04/24/2015  CLINICAL DATA:  Patient fell while walking on a treadmill. EXAM: CT HEAD WITHOUT CONTRAST CT CERVICAL SPINE WITHOUT CONTRAST TECHNIQUE: Multidetector CT imaging of the head and cervical spine was performed following the standard protocol without intravenous contrast. Multiplanar CT image reconstructions of the cervical spine were also generated. COMPARISON:  CT head 06/24/2014.  MRI brain 07/05/2012. FINDINGS: CT HEAD FINDINGS Ventricles and sulci appear symmetrical. No ventricular dilatation. No significant atrophy. Mild patchy low-attenuation changes in the deep white matter suggesting mild small vessel ischemic change. No mass effect or midline shift. No abnormal extra-axial fluid collections. Gray-white matter junctions are distinct. Basal cisterns are not effaced. No evidence of acute intracranial hemorrhage. No depressed skull fractures. Mild mucosal thickening in the paranasal sinuses. Mastoid air cells are not opacified. Vascular  calcifications. CT CERVICAL SPINE FINDINGS Straightening of the usual cervical lordosis without anterior subluxation. This is likely due to degenerative change or patient positioning but ligamentous injury or muscle spasm could also have this appearance and are not excluded. Degenerative changes in the cervical spine with narrowed cervical interspaces and endplate hypertrophic changes most prominent at C5-6. Mild degenerative changes in the facet joints. Normal alignment of the facet joints. No vertebral compression deformities. No prevertebral soft tissue swelling. C1-2 articulation appears intact. No focal bone lesion or bone destruction. Vascular calcifications. Soft tissues are unremarkable. Calcified nodule in the right supraclavicular region probably represents a calcified lymph node. IMPRESSION: No acute intracranial abnormalities. Nonspecific straightening of usual cervical lordosis. Mild degenerative changes in the cervical spine. No acute displaced fractures identified. Electronically Signed   By: Lucienne Capers M.D.   On: 04/24/2015 03:09   Ct Femur Right Wo Contrast  04/24/2015  CLINICAL DATA:  Fall. Distal femoral fracture on right knee radiographs. EXAM: CT OF THE RIGHT FEMUR WITHOUT CONTRAST TECHNIQUE: Multidetector CT imaging was performed according to the standard protocol. Multiplanar CT image reconstructions were also generated. COMPARISON:  Right knee 04/24/2015 FINDINGS: There is a right total knee arthroplasty. Components appear grossly well-seated allowing for streak artifact. There is a mostly transverse comminuted fracture of the distal right femur at the meta diaphysis. There is about 13 mm posterior displacement of the distal fracture fragment with about 16 mm impaction of the distal fracture fragment into the proximal fragment. The fracture line itself does not definitely extend to the femoral prosthesis, however the anterior fractured margin of the proximal fracture fragment impacts  on to the superior aspect of the femoral prosthesis. Visualized portion of the proximal tibia and fibula appear intact. Right hip and proximal femur appear intact. Soft tissue infiltration around the area of the fracture consistent with hematoma. Vascular calcifications. IMPRESSION: Mostly transverse comminuted fracture of the right femoral meta diaphysis with impaction and posterior displacement of the distal fracture fragment. Electronically Signed   By: Lucienne Capers M.D.   On: 04/24/2015 03:29   Dg Knee Complete 4 Views Right  04/24/2015  CLINICAL DATA:  Fall off a treadmill yesterday, pain above the right knee. EXAM: RIGHT KNEE - COMPLETE 4+ VIEW COMPARISON:  None. FINDINGS: Right knee arthroplasty in expected alignment. There is a periprosthetic lucency of the distal femur are  with apex anterior angulation. Fracture extends to the anterior aspect of the femoral component. Ossific densities about the medial joint line are likely chronic. Ossific density projecting over Hoffa's fat pad on the lateral view, etiology uncertain, and may reflect these medial densities. Small joint effusion. IMPRESSION: Displaced periprosthetic fracture of the distal femur extending to the superior aspect of the femoral component of right knee arthroplasty. Electronically Signed   By: Jeb Levering M.D.   On: 04/24/2015 03:04   Dg Hip Unilat  With Pelvis 2-3 Views Right  04/24/2015  CLINICAL DATA:  Patient fell of the treadmill. Anterior hip pain radiating to the knee. EXAM: DG HIP (WITH OR WITHOUT PELVIS) 2-3V RIGHT COMPARISON:  05/29/2014 FINDINGS: Degenerative changes in both hips. No evidence of acute fracture or dislocation. Pelvis appears intact. SI joints and symphysis pubis are not displaced. Postoperative changes in the lower lumbar spine and anterior pelvis. IMPRESSION: Mild degenerative changes in the right hip. No acute bony abnormalities. Electronically Signed   By: Lucienne Capers M.D.   On: 04/24/2015  01:23   Dg Femur, Min 2 Views Right  04/24/2015  CLINICAL DATA:  Fall off a treadmill with right distal femur pain. EXAM: RIGHT FEMUR 2 VIEWS COMPARISON:  None. FINDINGS: Mildly displaced distal femur fracture with apex anterior angulation about the femoral component of total knee arthroplasty. The proximal femur is intact. Hip alignment is maintained. IMPRESSION: Displaced periprosthetic fracture of the distal femur extending to the femoral component of total knee arthroplasty. Proximal femur is intact. Electronically Signed   By: Jeb Levering M.D.   On: 04/24/2015 03:06    Review of Systems  Constitutional: Negative.   HENT: Negative.   Eyes: Negative.   Respiratory: Positive for shortness of breath. Negative for cough, hemoptysis, sputum production and wheezing.        SOB with exertion  Cardiovascular: Negative.   Gastrointestinal: Positive for heartburn. Negative for nausea, vomiting, abdominal pain, diarrhea, constipation, blood in stool and melena.  Genitourinary: Negative.   Musculoskeletal: Positive for myalgias, back pain, joint pain and falls.       Right knee pain  Skin: Negative.   Neurological: Negative.   Endo/Heme/Allergies: Negative for environmental allergies and polydipsia. Bruises/bleeds easily.  Psychiatric/Behavioral: Negative.    Blood pressure 145/57, pulse 88, temperature 98.9 F (37.2 C), temperature source Oral, resp. rate 18, height _0  (1.626 m), weight 117.8 kg (259 lb 11.2 oz), SpO2 100 %. Physical Exam  Constitutional: She is oriented to person, place, and time. She appears well-developed. No distress.  Morbidly obese  HENT:  Head: Normocephalic and atraumatic.  Right Ear: External ear normal.  Left Ear: External ear normal.  Nose: Nose normal.  Mouth/Throat: Oropharynx is clear and moist.  Eyes: Conjunctivae and EOM are normal.  Neck: Normal range of motion. Neck supple.  Cardiovascular: Normal rate, regular rhythm, normal heart sounds and  intact distal pulses.   No murmur heard. Respiratory: Effort normal and breath sounds normal. No respiratory distress. She has no wheezes.  GI: Soft. Bowel sounds are normal. She exhibits no distension. There is no tenderness.  Musculoskeletal:       Left hip: Normal.       Left knee: Normal.       Right ankle: Normal.       Left ankle: Normal.  Right leg internally rotated and shortened due to fracture at distal femur. Unable to examine right hip due to right knee. Patient in knee immobilizer.  Neurological: She  is alert and oriented to person, place, and time. She has normal strength. No sensory deficit.  Skin: No rash noted. She is not diaphoretic. No erythema.  Psychiatric: She has a normal mood and affect. Her behavior is normal.    Assessment/Plan: Displaced distal periprosthetic femur fracture, right She has a fracture surrounding the femoral component of her right total knee. She will remain NPO until we decide when her surgery will be. SCDs for VTE prophylaxis now. Hold aggrenox. Patient to remain in knee immobilizer and NWB on the right LE. Will remain on bed rest. Foley catheter already in place. Will discuss with Dr. Lyla Glassing to determine who will assuming her care.   The risks, benefits, and alternatives were discussed with the patient. There are risks associated with the surgery including, but not limited to, problems with anesthesia (death), infection, differences in leg length/angulation/rotation, fracture of bones, loosening or failure of implants, malunion, nonunion, hematoma (blood accumulation) which may require surgical drainage, blood clots, pulmonary embolism, nerve injury (foot drop), and blood vessel injury. The patient understands these risks and elects to proceed.   CONSTABLE, AMBER LAUREN 04/24/2015, 7:23 AM

## 2015-04-25 NOTE — Transfer of Care (Signed)
Immediate Anesthesia Transfer of Care Note  Patient: [name]  Procedure(s) Performed: Procedure(s): INTRAMEDULLARY (IM) RIGHT  RETROGRADE FEMORAL NAILING (Right)  Patient Location: PACU  Anesthesia Type:General  Level of Consciousness: awake and alert   Airway & Oxygen Therapy: Patient Spontanous Breathing and Patient connected to face mask oxygen  Post-op Assessment: Report given to RN and Post -op Vital signs reviewed and stable  Post vital signs: Reviewed and stable  Last Vitals:  Filed Vitals:   04/24/15 2055 04/25/15 0538  BP: 103/46 123/48  Pulse: 85 83  Temp: 36.9 C 37.9 C  Resp: 20 20    Complications: No apparent anesthesia complications

## 2015-04-25 NOTE — Discharge Instructions (Signed)
Dr. Rod Can [facility] K[facility] Specialist [facility] [address], [place], [place] [address] [phone]   POSTOPERATIVE DIRECTIONS    Rehabilitation, Guidelines Following Surgery   WEIGHT BEARING Partial weight bearing with assist device as directed.  Touch Down Weight Bearing   HOME CARE INSTRUCTIONS  Remove items at home which could result in a fall. This includes throw rugs or furniture in walking pathways.  Continue medications as instructed at time of discharge.  You may have some home medications which will be placed on hold until you complete the course of blood thinner medication.  4 days after discharge, you may start showering. No tub baths or soaking your incisions. Do not put on socks or shoes without following the instructions of your caregivers.   Sit on chairs with arms. Use the chair arms to help push yourself up when arising.  Arrange for the use of a toilet seat elevator so you are not sitting low.  You may resume a sexual relationship in one month or when given the OK by your caregiver.  Use walker as long as suggested by your caregivers.  Avoid periods of inactivity such as sitting longer than an hour when not asleep. This helps prevent blood clots.  You may return to work once you are cleared by [employer], production.  Do not drive a car for 6 weeks or until released by your surgeon.  Do not drive while taking narcotics.  Wear elastic stockings for two weeks following surgery during the day but you may remove then at night.  Make sure you keep all of your appointments after your operation with all of your doctors and caregivers. You should call the office at the above phone number and make an appointment for approximately two weeks after the date of your surgery. Please pick up a stool softener and laxative for home use as long as you are requiring pain medications.  ICE to the affected hip every three hours for 30 minutes at a time  and then as needed for pain and swelling. Continue to use ice on the hip for pain and swelling from surgery. You may notice swelling that will progress down to the foot and ankle.  This is normal after surgery.  Elevate the leg when you are not up walking on it.   It is important for you to complete the blood thinner medication as prescribed by your doctor.  Continue to use the breathing machine which will help keep your temperature down.  It is common for your temperature to cycle up and down following surgery, especially at night when you are not up moving around and exerting yourself.  The breathing machine keeps your lungs expanded and your temperature down.  RANGE OF MOTION AND STRENGTHENING EXERCISES  These exercises are designed to help you keep full movement of your hip joint. Follow your caregiver's or physical therapist's instructions. Perform all exercises about fifteen times, three times per day or as directed. Exercise both hips, even if you have had only one joint replacement. These exercises can be done on a training (exercise) mat, on the floor, on a table or on a bed. Use whatever works the best and is most comfortable for you. Use music or television while you are exercising so that the exercises are a pleasant break in your day. This will make your life better with the exercises acting as a break in routine you can look forward to.  Lying on your back,  slowly slide your foot toward your buttocks, raising your knee up off the floor. Then slowly slide your foot back down until your leg is straight again.  Lying on your back spread your legs as far apart as you can without causing discomfort.  Lying on your side, raise your upper leg and foot straight up from the floor as far as is comfortable. Slowly lower the leg and repeat.  Lying on your back, tighten up the muscle in the front of your thigh (quadriceps muscles). You can do this by keeping your leg straight and trying to raise your heel  off the floor. This helps strengthen the largest muscle supporting your knee.  Lying on your back, tighten up the muscles of your buttocks both with the legs straight and with the knee bent at a comfortable angle while keeping your heel on the floor.   SKILLED REHAB INSTRUCTIONS: If the patient is transferred to a skilled rehab facility following release from the hospital, a list of the current medications will be sent to the facility for the patient to continue.  When discharged from the skilled rehab facility, please have the facility set up the patient's [place] prior to being released. Also, the skilled facility will be responsible for providing the patient with their medications at time of release from the facility to include their pain medication and their blood thinner medication. If the patient is still at the rehab facility at time of the two week follow up appointment, the skilled rehab facility will also need to assist the patient in arranging follow up appointment in our office and any transportation needs.  MAKE SURE YOU:  Understand these instructions.  Will watch your condition.  Will get help right away if you are not doing well or get worse.  Pick up stool softner and laxative for home use following surgery while on pain medications. Daily dry dressing changes as needed for the small dressings. Continue to use ice for pain and swelling after surgery. Do not use any lotions or creams on the incision until instructed by your surgeon.

## 2015-04-25 NOTE — Anesthesia Postprocedure Evaluation (Signed)
Anesthesia Post Note  Patient: Halsey J Lajeunesse  Procedure(s) Performed: Procedure(s) (LRB): INTRAMEDULLARY (IM) RIGHT  RETROGRADE FEMORAL NAILING (Right)  Patient location during evaluation: PACU Anesthesia Type: General Level of consciousness: awake and alert Pain management: pain level controlled Vital Signs Assessment: post-procedure vital signs reviewed and stable Respiratory status: spontaneous breathing, nonlabored ventilation, respiratory function stable and patient connected to nasal cannula oxygen Cardiovascular status: blood pressure returned to baseline and stable Postop Assessment: no signs of nausea or vomiting Anesthetic complications: no    Last Vitals:  Filed Vitals:   04/25/15 1416 04/25/15 1524  BP: 124/59 118/59  Pulse: 101 93  Temp: 37.1 C 37 C  Resp: 13 13    Last Pain:  Filed Vitals:   04/25/15 1611  PainSc: Asleep                 Bow Buntyn S

## 2015-04-25 NOTE — Interval H&P Note (Signed)
History and Physical Interval Note:  04/25/2015 7:34 AM  [name]  has presented today for surgery, with the diagnosis of right periprosthetic femur fracture  The various methods of treatment have been discussed with the patient and family. After consideration of risks, benefits and other options for treatment, the patient has consented to  Procedure(s): INTRAMEDULLARY (IM) RIGHT  RETROGRADE FEMORAL NAILING (Right) as a surgical intervention .  The patient's history has been reviewed, patient examined, no change in status, stable for surgery.  I have reviewed the patient's chart and labs.  Questions were answered to the patient's satisfaction.     Franck Vinal, Horald Pollen

## 2015-04-25 NOTE — Brief Op Note (Signed)
04/24/2015 - 04/25/2015  11:27 AM  PATIENT:  [name]  75 y.o. female  PRE-OPERATIVE DIAGNOSIS:  right periprosthetic femur fracture  POST-OPERATIVE DIAGNOSIS:  right periprosthetic femur fracture  PROCEDURE:  Procedure(s): INTRAMEDULLARY (IM) RIGHT  RETROGRADE FEMORAL NAILING (Right)  SURGEON:  Surgeon(s) and Role:    * Rod Can, MD - Primary  PHYSICIAN ASSISTANT: none  ASSISTANTS: none   ANESTHESIA:   general  EBL:  Total I/O In: 2035 [I.V.:1700; Blood:335] Out: 630 [Urine:330; Blood:300]  BLOOD ADMINISTERED: 1 unit PRBCs  DRAINS: none   LOCAL MEDICATIONS USED:  NONE  SPECIMEN:  No Specimen  DISPOSITION OF SPECIMEN:  N/A  COUNTS:  YES  TOURNIQUET:  * No tourniquets in log *  DICTATION: .Other Dictation: Dictation Number [id number]  PLAN OF CARE: Admit to inpatient   PATIENT DISPOSITION:  PACU - hemodynamically stable.   Delay start of Pharmacological VTE agent (>24hrs) due to surgical blood loss or risk of bleeding: no

## 2015-04-26 DIAGNOSIS — N183 Chronic kidney disease, stage 3 (moderate): Secondary | ICD-10-CM

## 2015-04-26 DIAGNOSIS — E119 Type 2 diabetes mellitus without complications: Secondary | ICD-10-CM

## 2015-04-26 DIAGNOSIS — I10 Essential (primary) hypertension: Secondary | ICD-10-CM

## 2015-04-26 LAB — CBC
HEMATOCRIT: 24.8 % — AB (ref 36.0–46.0)
HEMOGLOBIN: 8.2 g/dL — AB (ref 12.0–15.0)
MCH: 30.1 pg (ref 26.0–34.0)
MCHC: 33.1 g/dL (ref 30.0–36.0)
MCV: 91.2 fL (ref 78.0–100.0)
Platelets: 184 10*3/uL (ref 150–400)
RBC: 2.72 MIL/uL — AB (ref 3.87–5.11)
RDW: 15.1 % (ref 11.5–15.5)
WBC: 20 10*3/uL — ABNORMAL HIGH (ref 4.0–10.5)

## 2015-04-26 LAB — GLUCOSE, CAPILLARY
GLUCOSE-CAPILLARY: 155 mg/dL — AB (ref 65–99)
GLUCOSE-CAPILLARY: 172 mg/dL — AB (ref 65–99)
Glucose-Capillary: 151 mg/dL — ABNORMAL HIGH (ref 65–99)
Glucose-Capillary: 152 mg/dL — ABNORMAL HIGH (ref 65–99)
Glucose-Capillary: 161 mg/dL — ABNORMAL HIGH (ref 65–99)

## 2015-04-26 LAB — BASIC METABOLIC PANEL
Anion gap: 10 (ref 5–15)
BUN: 16 mg/dL (ref 6–20)
CHLORIDE: 105 mmol/L (ref 101–111)
CO2: 24 mmol/L (ref 22–32)
Calcium: 6.8 mg/dL — ABNORMAL LOW (ref 8.9–10.3)
Creatinine, Ser: 1.29 mg/dL — ABNORMAL HIGH (ref 0.44–1.00)
GFR calc Af Amer: 45 mL/min — ABNORMAL LOW (ref >60)
GFR calc non Af Amer: 39 mL/min — ABNORMAL LOW (ref >60)
GLUCOSE: 158 mg/dL — AB (ref 65–99)
POTASSIUM: 4.1 mmol/L (ref 3.5–5.1)
Sodium: 139 mmol/L (ref 135–145)

## 2015-04-26 MED ORDER — INSULIN ASPART 100 UNIT/ML ~~LOC~~ SOLN
0.0000 [IU] | Freq: Three times a day (TID) | SUBCUTANEOUS | Status: DC
  Administered 2015-04-26 (×2): 2 [IU] via SUBCUTANEOUS
  Administered 2015-04-27 (×2): 1 [IU] via SUBCUTANEOUS
  Administered 2015-04-27: 2 [IU] via SUBCUTANEOUS
  Administered 2015-04-28: 1 [IU] via SUBCUTANEOUS
  Administered 2015-04-28: 2 [IU] via SUBCUTANEOUS
  Administered 2015-04-28: 1 [IU] via SUBCUTANEOUS
  Administered 2015-04-29: 2 [IU] via SUBCUTANEOUS
  Administered 2015-04-29: 1 [IU] via SUBCUTANEOUS

## 2015-04-26 MED ORDER — ONDANSETRON HCL 4 MG/2ML IJ SOLN: 4.0000 mg | Freq: Four times a day (QID) | INTRAMUSCULAR | Status: DC | PRN

## 2015-04-26 MED ORDER — OXYCODONE HCL 5 MG PO TABS
5.0000 mg | ORAL_TABLET | ORAL | Status: DC | PRN
  Administered 2015-04-26 – 2015-04-27 (×6): 10 mg via ORAL
  Administered 2015-04-28: 5 mg via ORAL
  Administered 2015-04-28: 10 mg via ORAL
  Administered 2015-04-28: 5 mg via ORAL
  Filled 2015-04-26 (×8): qty 2
  Filled 2015-04-26: qty 1

## 2015-04-26 MED ORDER — HYDROCODONE-ACETAMINOPHEN 5-325 MG PO TABS: 1.0000 | ORAL_TABLET | ORAL | Status: DC | PRN

## 2015-04-26 NOTE — Care Management Important Message (Signed)
Important Message  Patient Details  Name: [name] MRN: [id number] Date of Birth: 1940-10-31   Medicare Important Message Given:  Yes    Erenest Rasher, RN 04/26/2015, 5:40 PM

## 2015-04-26 NOTE — Progress Notes (Signed)
Did not remove foley this am, patients progressing slowly. Will follow up with day shift RN.

## 2015-04-26 NOTE — Progress Notes (Signed)
    Subjective: 1 Day Post-Op Procedure(s) (LRB): INTRAMEDULLARY (IM) RIGHT  RETROGRADE FEMORAL NAILING (Right) Patient reports pain as 8 on 0-10 scale.   Denies CP or SOB.  Voiding without difficulty. Positive flatus. Objective: Vital signs in last 24 hours: Temp:  [97.7 F (36.5 C)-100.5 F (38.1 C)] 99.5 F (37.5 C) (04/22 0300) Pulse Rate:  [92-103] 92 (04/22 0300) Resp:  [12-23] 16 (04/22 0300) BP: (103-142)/(40-98) 108/47 mmHg (04/22 0300) SpO2:  [91 %-100 %] 98 % (04/22 0300)  Intake/Output from previous day: 04/21 0701 - 04/22 0700 In: 2915 [P.O.:480; I.V.:2100; Blood:335] Out: 1620 [Urine:1320; Blood:300] Intake/Output this shift:    Labs:  Recent Labs  04/24/15 0036 04/26/15 0415  HGB 9.5* 8.2*    Recent Labs  04/24/15 0036 04/26/15 0415  WBC 13.6* 20.0*  RBC 3.22* 2.72*  HCT 29.4* 24.8*  PLT 291 184    Recent Labs  04/24/15 0036 04/26/15 0415  NA 142 139  K 4.2 4.1  CL 108 105  CO2 26 24  BUN 27* 16  CREATININE 1.47* 1.29*  GLUCOSE 160* 158*  CALCIUM 8.0* 6.8*    Recent Labs  04/24/15 0036  INR 0.95    Physical Exam: Neurologically intact ABD soft Sensation intact distally Incision: dressing C/D/I Compartment soft ACE wrap and ICE on surgical wound   Assessment/Plan: 1 Day Post-Op Procedure(s) (LRB): INTRAMEDULLARY (IM) RIGHT  RETROGRADE FEMORAL NAILING (Right) Advance diet Up with therapy  Foley still in place - will DC later today TDWB RLE Continue Aggrenox PT/OT SNF placement   Ellis Mehaffey, Darla Lesches for Dr. Melina Schools [facility] [phone] 04/26/2015, 7:08 AM

## 2015-04-26 NOTE — Care Management Note (Signed)
Case Management Note  Patient Details  Name: [name] MRN: [id number] Date of Birth: 11-Jun-1940  Subjective/Objective:        R distal femur peri-prothetic fx s/p IM fixation            Action/Plan: Discharge Planning: NCM spoke to Leta Speller # [phone] at bedside. States she lives at [facility]. She is requesting [facility] for rehab. CSW referral for SNF.   Expected Discharge Date:  04/28/2015              Expected Discharge Plan:  Skilled Nursing Facility  In-House Referral:  Clinical Social Work  Discharge planning Services  CM Consult  Post Acute Care Choice:  NA Choice offered to:  NA  DME Arranged:  N/A DME Agency:  NA  HH Arranged:  NA HH Agency:  NA  Status of Service:  Completed, signed off  Medicare Important Message Given:    Date Medicare IM Given:    Medicare IM give by:    Date Additional Medicare IM Given:    Additional Medicare Important Message give by:     If discussed at [place] of Stay Meetings, dates discussed:    Additional Comments:  Erenest Rasher, RN 04/26/2015, 5:37 PM

## 2015-04-26 NOTE — Progress Notes (Signed)
OT Cancellation Note  Patient Details Name: [name] MRN: [id number] DOB: 1940/09/03   Cancelled Treatment:    Reason Eval/Treat Not Completed: Other (comment) Spoke with PT and pt with difficulty attempting standing today so will hold off on OT eval and check back.  Jules Schick  [id number] 04/26/2015, 2:50 PM

## 2015-04-26 NOTE — Progress Notes (Addendum)
PROGRESS NOTE    [name]  [id number] DOB: 12/11/1940 DOA: 04/24/2015  PCP: Leamon Arnt, MD    Brief Narrative:   [name] is a 75 y.o. female with pmh of DM, HLD, HTN. Patient presents to the ED after she fell while walking on a treadmill which was speeding up. She suffered immediate severe R knee pain, deformity of the leg with shortening and internal rotation, and inability to walk.  Assessment & Plan:   Principal Problem:   Fracture, femur, distal, right, closed, initial encounter - s/p fixation on 4/21 by ortho, touch down weight bearing and ASA 81 mg BID and Aggrenox for DVT prophylaxis - remove foley today and get OOB - getting a great deal of Dilaudid, Morphine and vicodin and appears groggy- will transition to Oxycodone only  Active Problems:   DM2 (diabetes mellitus, type 2) - on ISS-hold Metformin    HTN (hypertension)  - hold Norvasc and Lisinopril/ HCTZ as BP low  Low grade fever 100.2 last night and leukocytosis - may just be surgery and stress related- leukocytosis may be due to blood transfusion yesterday - she received Vanc and Clinda prior to surgery yesterday - no cough, nausea, or vomiting- has foley which will be removed today - follow for now - change Hydrocodone/ Acetaminophen to Oxycodone so fevers are not masked  Anemia - Hb 9.5 on 4/21 >> 8.2 today after 1 U PRBC on 4/21  CKD 3 - stable  Morbid obesity Body mass index is 44.56 kg/(m^2).  DVT prophylaxis: ASA 81 mg  + Aggrenox (per ortho) Code Status: full code Family Communication: daughter [name]  Disposition Plan: probably SNF- awaiting PT eval   Consultants:   ortho  Procedures:   Intramedullary fixation of right periprosthetic distal femur fracture.  Antimicrobials:  Anti-infectives    Start     Dose/Rate Route Frequency Ordered Stop   04/25/15 1430  clindamycin (CLEOCIN) IVPB 600 mg     600 mg 100 mL/hr over 30 Minutes Intravenous Every 6 hours  04/25/15 1314 04/25/15 2101   04/25/15 0815  vancomycin (VANCOCIN) 1,500 mg in sodium chloride 0.9 % 500 mL IVPB     1,500 mg 250 mL/hr over 120 Minutes Intravenous On call to O.R. 04/25/15 0804 04/25/15 0900   04/25/15 0815  clindamycin (CLEOCIN) IVPB 900 mg     900 mg 100 mL/hr over 30 Minutes Intravenous On call to O.R. 04/25/15 0804 04/25/15 0900      Subjective: No nausea, vomiting, cough, dyspnea or sore throat. Ate a good breakfast this AM.   Objective: Filed Vitals:   04/25/15 1745 04/25/15 2145 04/26/15 0156 04/26/15 0300  BP: 119/62 114/45 113/58 108/47  Pulse: 92 92 93 92  Temp: 97.8 F (36.6 C) 100.5 F (38.1 C) 100.2 F (37.9 C) 99.5 F (37.5 C)  TempSrc: Oral Oral Oral Oral  Resp: 12 16 16 16   Height:      Weight:      SpO2: 96% 97% 96% 98%    Intake/Output Summary (Last 24 hours) at 04/26/15 0926 Last data filed at 04/26/15 0448  Gross per 24 hour  Intake   2915 ml  Output   1620 ml  Net   1295 ml   Filed Weights   04/24/15 0614  Weight: 117.8 kg (259 lb 11.2 oz)    Examination: General exam: Appears sleepy, dazed- took some time to answer my questions- comfortable Respiratory system: Clear to auscultation. Respiratory effort normal. Cardiovascular system: S1 &  S2 heard, RRR. No JVD, murmurs, rubs, gallops or clicks. No pedal edema. Gastrointestinal system: Abdomen is nondistended, soft and nontender. No organomegaly or masses felt. Normal bowel sounds heard. Central nervous system: Alert and oriented. No focal neurological deficits. Extremities: left hand slightly swollen- right leg in ace bandage Skin: No rashes, lesions or ulcers   Data Reviewed: I have personally reviewed following labs and imaging studies  CBC:  Recent Labs Lab 04/24/15 0036 04/26/15 0415  WBC 13.6* 20.0*  NEUTROABS 11.0*  --   HGB 9.5* 8.2*  HCT 29.4* 24.8*  MCV 91.3 91.2  PLT 291 [id number]   Basic Metabolic Panel:  Recent Labs Lab 04/24/15 0036 04/26/15 0415  NA  142 139  K 4.2 4.1  CL 108 105  CO2 26 24  GLUCOSE 160* 158*  BUN 27* 16  CREATININE 1.47* 1.29*  CALCIUM 8.0* 6.8*   GFR: Estimated Creatinine Clearance: 46.8 mL/min (by C-G formula based on Cr of 1.29). Liver Function Tests: No results for input(s): AST, ALT, ALKPHOS, BILITOT, PROT, ALBUMIN in the last 168 hours. No results for input(s): LIPASE, AMYLASE in the last 168 hours. No results for input(s): AMMONIA in the last 168 hours. Coagulation Profile:  Recent Labs Lab 04/24/15 0036  INR 0.95   Cardiac Enzymes: No results for input(s): CKTOTAL, CKMB, CKMBINDEX, TROPONINI in the last 168 hours. BNP (last 3 results) No results for input(s): PROBNP in the last 8760 hours. HbA1C: No results for input(s): HGBA1C in the last 72 hours. CBG:  Recent Labs Lab 04/25/15 1600 04/25/15 2232 04/26/15 0031 04/26/15 0445 04/26/15 0726  GLUCAP 186* 170* 151* 152* 155*   Lipid Profile: No results for input(s): CHOL, HDL, LDLCALC, TRIG, CHOLHDL, LDLDIRECT in the last 72 hours. Thyroid Function Tests: No results for input(s): TSH, T4TOTAL, FREET4, T3FREE, THYROIDAB in the last 72 hours. Anemia Panel: No results for input(s): VITAMINB12, FOLATE, FERRITIN, TIBC, IRON, RETICCTPCT in the last 72 hours. Urine analysis:    Component Value Date/Time   COLORURINE YELLOW 06/24/2014 1243   APPEARANCEUR CLEAR 06/24/2014 1243   LABSPEC 1.009 06/24/2014 1243   PHURINE 5.0 06/24/2014 1243   GLUCOSEU NEGATIVE 06/24/2014 1243   HGBUR TRACE* 06/24/2014 1243   [place] 06/24/2014 1243   KETONESUR NEGATIVE 06/24/2014 1243   PROTEINUR NEGATIVE 06/24/2014 1243   UROBILINOGEN 0.2 06/24/2014 1243   NITRITE NEGATIVE 06/24/2014 1243   LEUKOCYTESUR NEGATIVE 06/24/2014 1243   Sepsis Labs: @LABRCNTIP (procalcitonin:4,lacticidven:4)  ) Recent Results (from the past 240 hour(s))  MRSA PCR Screening     Status: None   Collection Time: 04/24/15  5:34 PM  Result Value Ref Range Status    MRSA by PCR NEGATIVE NEGATIVE Final    Comment:        The GeneXpert MRSA Assay (FDA approved for NASAL specimens only), is one component of a comprehensive MRSA colonization surveillance program. It is not intended to diagnose MRSA infection nor to guide or monitor treatment for MRSA infections.          Radiology Studies: Dg C-arm 1-60 Min-no Report  04/25/2015  CLINICAL DATA: surgery C-ARM 1-60 MINUTES Fluoroscopy was utilized by the requesting physician.  No radiographic interpretation.   Dg Femur, Min 2 Views Right  04/25/2015  CLINICAL DATA:  IM nail EXAM: RIGHT FEMUR 2 VIEWS; DG C-ARM 1-60 MIN-NO REPORT COMPARISON:  04/24/2015 FINDINGS: Multiple intraoperative spot images demonstrate placement of right femoral IM nail across the distal femoral metaphyseal fracture. Near anatomic alignment. Changes from remote right knee  replacement. IMPRESSION: Internal fixation across the distal right femoral fracture. Near anatomic alignment. No visible complicating feature. Electronically Signed   By: Rolm Baptise M.D.   On: 04/25/2015 11:15   Dg Femur Port, Min 2 Views Right  04/25/2015  CLINICAL DATA:  Screw and nail fixation for distal femur fracture EXAM: RIGHT FEMUR PORTABLE 1 VIEW COMPARISON:  April 24, 2015 FINDINGS: Frontal and lateral views were obtained. There is screw and nail fixation through a comminuted fracture of the distal fibular diaphysis and metaphysis with alignment of the fracture fragments in essentially anatomic alignment. There is also a total knee replacement on the left with prosthetic components appearing well-seated. No new fracture. No dislocation. Air in the soft tissues is an expected postoperative finding. IMPRESSION: Alignment essentially anatomic at the distal femoral fracture site with screw and nail fixation. The total knee prosthetic components appear well seated. No new fracture. No dislocation. Electronically Signed   By: Lowella Grip III M.D.   On:  04/25/2015 12:46        Scheduled Meds: . amLODipine  10 mg Oral Daily  . aspirin  81 mg Oral BID  . atorvastatin  20 mg Oral Daily  . calcitRIOL  0.5 mcg Oral Daily  . dipyridamole-aspirin  1 capsule Oral BID  . docusate sodium  100 mg Oral BID  . insulin aspart  0-9 Units Subcutaneous Q4H  . levothyroxine  137 mcg Oral QAC breakfast  . pregabalin  150 mg Oral BID  . senna  1 tablet Oral BID   Continuous Infusions:    LOS: 2 days    Time spent in minutes: 4    [place], MD [facility] Pager: [email or web address] Password [facility] 04/26/2015, 9:26 AM

## 2015-04-26 NOTE — Evaluation (Signed)
Physical Therapy Evaluation Patient Details Name: [name] MRN: [id number] DOB: 01-31-1940 Today's Date: 04/26/2015   History of Present Illness  R distal femur peri-prothetic fx s/p IM fixation.  Pt with hx of DM, TIA, R TKR, lumbar fusion and morbid obesity  Clinical Impression  Pt s/p R femur fx presents with functional mobility limitations 2* decreased R LE strength/ROM, post op pain, TWB R LE, premorbid deconditioning and obesity.  Pt would greatly benefit from follow up rehab at SNF level to maximize IND and safety.    Follow Up Recommendations SNF    Equipment Recommendations  None recommended by PT    Recommendations for Other Services OT consult     Precautions / Restrictions Precautions Precautions: Fall Restrictions Weight Bearing Restrictions: Yes RLE Weight Bearing: Touchdown weight bearing RLE Partial Weight Bearing Percentage or Pounds: 30%      Mobility  Bed Mobility Overal bed mobility: +2 for physical assistance;+ 2 for safety/equipment;Needs Assistance Bed Mobility: Supine to Sit;Sit to Supine     Supine to sit: Mod assist;+2 for physical assistance;+2 for safety/equipment Sit to supine: Max assist;+2 for physical assistance;+2 for safety/equipment   General bed mobility comments: Increased time and cues for sequence and use of L LE to self assist  Transfers Overall transfer level: Needs assistance Equipment used: Rolling walker (2 wheeled) Transfers: Sit to/from Stand Sit to Stand: Mod assist;Max assist;+2 physical assistance;From elevated surface;+2 safety/equipment         General transfer comment: Increased time and cues for LE management and use of UEs to self assist.  Pt stood x 2 with RW.  Ambulation/Gait             General Gait Details: Pt unable to initiate step either LE.  Bed pulled behind pt to allow safe transition to sitting  Stairs            Wheelchair Mobility    Modified Rankin (Stroke Patients Only)        Balance                                             Pertinent Vitals/Pain Pain Assessment: 0-10 Pain Score: 4  Pain Location: R LE Pain Descriptors / Indicators: Aching;Sore Pain Intervention(s): Limited activity within patient's tolerance;Monitored during session;Premedicated before session;Ice applied    Home Living Family/patient expects to be discharged to:: Skilled nursing facility Living Arrangements: Alone                    Prior Function Level of Independence: Independent with assistive device(s)         Comments: Pt used RW     Hand Dominance        Extremity/Trunk Assessment   Upper Extremity Assessment: Generalized weakness           Lower Extremity Assessment: Generalized weakness;RLE deficits/detail RLE Deficits / Details: R knee flex tolerated to 20 degrees pain ltd with muscle guardign    Cervical / Trunk Assessment: Kyphotic  Communication   Communication: No difficulties  Cognition Arousal/Alertness: Awake/alert Behavior During Therapy: WFL for tasks assessed/performed Overall Cognitive Status: Within Functional Limits for tasks assessed                      General Comments      Exercises General Exercises - Lower Extremity Ankle Circles/Pumps: AAROM;Both;10  reps;Supine Heel Slides: AAROM;Right;10 reps;Supine Hip ABduction/ADduction: AAROM;Right;15 reps;Supine      Assessment/Plan    PT Assessment Patient needs continued PT services  PT Diagnosis Difficulty walking   PT Problem List Decreased strength;Decreased range of motion;Decreased activity tolerance;Decreased balance;Decreased mobility;Decreased knowledge of use of DME;Obesity;Pain  PT Treatment Interventions DME instruction;Gait training;Functional mobility training;Therapeutic activities;Therapeutic exercise;Patient/family education   PT Goals (Current goals can be found in the Care Plan section) Acute Rehab PT Goals Patient  Stated Goal: [facility] for rehab PT Goal Formulation: With patient Time For Goal Achievement: 04/30/15 Potential to Achieve Goals: Fair    Frequency Min 3X/week   Barriers to discharge        Co-evaluation               End of Session Equipment Utilized During Treatment: Gait belt Activity Tolerance: Patient limited by fatigue;Patient limited by pain Patient left: in bed;with call bell/phone within reach;with nursing/sitter in room Nurse Communication: Mobility status         Time: [id number] PT Time Calculation (min) (ACUTE ONLY): 43 min   Charges:   PT Evaluation $PT Eval Low Complexity: 1 Procedure PT Treatments $Therapeutic Exercise: 8-22 mins $Therapeutic Activity: 8-22 mins   PT G Codes:        Naiah Donahoe May 03, 2015, 3:26 PM

## 2015-04-27 ENCOUNTER — Inpatient Hospital Stay (HOSPITAL_COMMUNITY): Payer: Medicare Other

## 2015-04-27 DIAGNOSIS — E109 Type 1 diabetes mellitus without complications: Secondary | ICD-10-CM

## 2015-04-27 DIAGNOSIS — D72829 Elevated white blood cell count, unspecified: Secondary | ICD-10-CM

## 2015-04-27 LAB — URINALYSIS, ROUTINE W REFLEX MICROSCOPIC
Bilirubin Urine: NEGATIVE
Glucose, UA: NEGATIVE mg/dL
Hgb urine dipstick: NEGATIVE
KETONES UR: NEGATIVE mg/dL
LEUKOCYTES UA: NEGATIVE
NITRITE: NEGATIVE
PROTEIN: 30 mg/dL — AB
Specific Gravity, Urine: 1.019 (ref 1.005–1.030)
pH: 5 (ref 5.0–8.0)

## 2015-04-27 LAB — URINE MICROSCOPIC-ADD ON
RBC / HPF: NONE SEEN RBC/hpf (ref 0–5)
WBC UA: NONE SEEN WBC/hpf (ref 0–5)

## 2015-04-27 LAB — BASIC METABOLIC PANEL
ANION GAP: 8 (ref 5–15)
BUN: 22 mg/dL — ABNORMAL HIGH (ref 6–20)
CALCIUM: 6.4 mg/dL — AB (ref 8.9–10.3)
CHLORIDE: 109 mmol/L (ref 101–111)
CO2: 24 mmol/L (ref 22–32)
Creatinine, Ser: 1.72 mg/dL — ABNORMAL HIGH (ref 0.44–1.00)
GFR calc non Af Amer: 28 mL/min — ABNORMAL LOW (ref >60)
GFR, EST AFRICAN AMERICAN: 32 mL/min — AB (ref >60)
GLUCOSE: 162 mg/dL — AB (ref 65–99)
Potassium: 4.6 mmol/L (ref 3.5–5.1)
Sodium: 141 mmol/L (ref 135–145)

## 2015-04-27 LAB — CBC WITH DIFFERENTIAL/PLATELET
Basophils Absolute: 0 10*3/uL (ref 0.0–0.1)
Basophils Relative: 0 %
Eosinophils Absolute: 0.3 10*3/uL (ref 0.0–0.7)
Eosinophils Relative: 1 %
HEMATOCRIT: 23.9 % — AB (ref 36.0–46.0)
HEMOGLOBIN: 7.8 g/dL — AB (ref 12.0–15.0)
LYMPHS ABS: 1.4 10*3/uL (ref 0.7–4.0)
Lymphocytes Relative: 6 %
MCH: 29 pg (ref 26.0–34.0)
MCHC: 32.6 g/dL (ref 30.0–36.0)
MCV: 88.8 fL (ref 78.0–100.0)
MONO ABS: 1.4 10*3/uL — AB (ref 0.1–1.0)
MONOS PCT: 7 %
NEUTROS ABS: 18.8 10*3/uL — AB (ref 1.7–7.7)
NEUTROS PCT: 86 %
Platelets: 199 10*3/uL (ref 150–400)
RBC: 2.69 MIL/uL — ABNORMAL LOW (ref 3.87–5.11)
RDW: 14.9 % (ref 11.5–15.5)
WBC: 21.9 10*3/uL — ABNORMAL HIGH (ref 4.0–10.5)

## 2015-04-27 LAB — CBC
HEMATOCRIT: 23.4 % — AB (ref 36.0–46.0)
HEMOGLOBIN: 7.8 g/dL — AB (ref 12.0–15.0)
MCH: 30.4 pg (ref 26.0–34.0)
MCHC: 33.3 g/dL (ref 30.0–36.0)
MCV: 91.1 fL (ref 78.0–100.0)
Platelets: 186 10*3/uL (ref 150–400)
RBC: 2.57 MIL/uL — ABNORMAL LOW (ref 3.87–5.11)
RDW: 15 % (ref 11.5–15.5)
WBC: 22.2 10*3/uL — ABNORMAL HIGH (ref 4.0–10.5)

## 2015-04-27 LAB — GLUCOSE, CAPILLARY
GLUCOSE-CAPILLARY: 174 mg/dL — AB (ref 65–99)
GLUCOSE-CAPILLARY: 139 mg/dL — AB (ref 65–99)
GLUCOSE-CAPILLARY: 198 mg/dL — AB (ref 65–99)
Glucose-Capillary: 148 mg/dL — ABNORMAL HIGH (ref 65–99)
Glucose-Capillary: 145 mg/dL — ABNORMAL HIGH (ref 65–99)

## 2015-04-27 NOTE — NC FL2 (Signed)
[place] LEVEL OF CARE SCREENING TOOL     IDENTIFICATION  Patient Name: [name] Birthdate: [birth year] Sex: female Admission Date (Current Location): 04/24/2015  [facility] and [place] Number:  Herbalist and Address:  [facility],  [place] [address], [place]      Provider Number: [id number]  Attending Physician Name and Address:  Debbe Odea, MD  Relative Name and Phone Number:       Current Level of Care: Hospital Recommended Level of Care: [place] Prior Approval Number:    Date Approved/Denied:   PASRR Number: [id number]  Discharge Plan: SNF    Current Diagnoses: Patient Active Problem List   Diagnosis Date Noted  . Periprosthetic fracture around internal prosthetic right knee joint ([place]) 04/25/2015  . Fracture, femur, distal, right, closed, initial encounter 04/24/2015  . Acute gout 10/17/2013  . Acute gout of left hand 10/17/2013  . OSTEOARTHRITIS, KNEES, BILATERAL 08/07/2009  . CHEST PAIN, ATYPICAL 02/10/2009  . THYROID CANCER, HX OF 02/10/2009  . BACK PAIN, ACUTE 10/02/2008  . NAUSEA 10/02/2008  . CVA WITH RIGHT HEMIPARESIS 08/15/2008  . HTN (hypertension) 08/15/2008  . CHEST PAIN UNSPECIFIED 12/18/2007  . LEUKOCYTOSIS UNSPECIFIED 08/14/2007  . HYPOTHYROIDISM 07/03/2007  . DM2 (diabetes mellitus, type 2) ([place]) 07/03/2007  . Spinal epidural abscess 07/03/2007  . Benign essential HTN 07/03/2007  . PSOAS MUSCLE ABSCESS 06/09/2007    Orientation RESPIRATION BLADDER Height & Weight     Self, Time, Situation, Place  O2 Continent Weight: 259 lb 11.2 oz (117.8 kg) Height:  5\' 4"  (162.6 cm)  BEHAVIORAL SYMPTOMS/MOOD NEUROLOGICAL BOWEL NUTRITION STATUS      Continent Diet (Please see discharge summary.)  AMBULATORY STATUS COMMUNICATION OF NEEDS Skin   Extensive Assist Verbally Surgical wounds                       Personal Care Assistance Level of Assistance  Bathing, Feeding,  Dressing Bathing Assistance: Limited assistance Feeding assistance: Limited assistance Dressing Assistance: Limited assistance     Functional Limitations Info             SPECIAL CARE FACTORS FREQUENCY  PT (By licensed PT), OT (By licensed OT)     PT Frequency: 5 OT Frequency: 5            Contractures      Additional Factors Info  Code Status, Allergies Code Status Info: FULL Allergies Info: Actos, Ativan, Cephalexin, Oxycodone, Rosiglitazone, Tramadol           Current Medications (04/27/2015):  This is the current hospital active medication list Current Facility-Administered Medications  Medication Dose Route Frequency Provider Last Rate Last Dose  . acetaminophen (TYLENOL) tablet 650 mg  650 mg Oral Q6H PRN Rod Can, MD   650 mg at 04/27/15 1458   Or  . acetaminophen (TYLENOL) suppository 650 mg  650 mg Rectal Q6H PRN Rod Can, MD      . aspirin chewable tablet 81 mg  81 mg Oral BID Rod Can, MD   81 mg at 04/27/15 1051  . atorvastatin (LIPITOR) tablet 20 mg  20 mg Oral Daily [name], DO   20 mg at 04/26/15 1742  . calcitRIOL (ROCALTROL) capsule 0.5 mcg  0.5 mcg Oral Daily [name], DO   0.5 mcg at 04/27/15 1051  . dipyridamole-aspirin (AGGRENOX) 200-25 MG per 12 hr capsule 1 capsule  1 capsule Oral BID Rod Can, MD  1 capsule at 04/27/15 1051  . docusate sodium (COLACE) capsule 100 mg  100 mg Oral BID Rod Can, MD   100 mg at 04/27/15 1051  . fluticasone (FLONASE) 50 MCG/ACT nasal spray 1 spray  1 spray Each Nare Daily PRN [name], DO      . insulin aspart (novoLOG) injection 0-9 Units  0-9 Units Subcutaneous TID WC Debbe Odea, MD   2 Units at 04/27/15 1217  . levothyroxine (SYNTHROID, LEVOTHROID) tablet 137 mcg  137 mcg Oral QAC breakfast [name], DO   137 mcg at 04/27/15 [id number]  . menthol-cetylpyridinium (CEPACOL) lozenge 3 mg  1 lozenge Oral PRN Rod Can, MD       Or  . phenol (CHLORASEPTIC)  mouth spray 1 spray  1 spray Mouth/Throat PRN Rod Can, MD      . methocarbamol (ROBAXIN) tablet 500 mg  500 mg Oral Q6H PRN Rod Can, MD   500 mg at 04/27/15 1051   Or  . methocarbamol (ROBAXIN) 500 mg in dextrose 5 % 50 mL IVPB  500 mg Intravenous Q6H PRN Rod Can, MD   500 mg at 04/25/15 1244  . ondansetron (ZOFRAN) tablet 4 mg  4 mg Oral Q6H PRN Rod Can, MD       Or  . ondansetron [facility]) injection 4 mg  4 mg Intravenous Q6H PRN Rod Can, MD      . ondansetron [facility]) injection 4 mg  4 mg Intravenous Q6H PRN Debbe Odea, MD      . oxyCODONE (Oxy IR/ROXICODONE) immediate release tablet 5-10 mg  5-10 mg Oral Q4H PRN Debbe Odea, MD   10 mg at 04/27/15 1051  . pregabalin (LYRICA) capsule 150 mg  150 mg Oral BID [name], DO   150 mg at 04/27/15 1458  . senna (SENOKOT) tablet 8.6 mg  1 tablet Oral BID Rod Can, MD   8.6 mg at 04/27/15 1051     Discharge Medications: Please see discharge summary for a list of discharge medications.  Relevant Imaging Results:  Relevant Lab Results:   Additional Information SSN: [id number]  Caroline Sauger, LCSW

## 2015-04-27 NOTE — Clinical Social Work Placement (Signed)
   CLINICAL SOCIAL WORK PLACEMENT  NOTE  Date:  04/27/2015  Patient Details  Name: [name] MRN: [id number] Date of Birth: 01-Jul-1940  Clinical Social Work is seeking post-discharge placement for this patient at the [place] level of care (*CSW will initial, date and re-position this form in  chart as items are completed):  Yes   Patient/family provided with [place] Work Department's list of facilities offering this level of care within the geographic area requested by the patient (or if unable, by the patient's family).  Yes   Patient/family informed of their freedom to choose among providers that offer the needed level of care, that participate in Medicare, Medicaid or managed care program needed by the patient, have an available bed and are willing to accept the patient.  Yes   Patient/family informed of [facility]'s ownership interest in [facility] and [facility], as well as of the fact that they are under no obligation to receive care at these facilities.  PASRR submitted to EDS on       PASRR number received on       Existing PASRR number confirmed on 04/27/15     FL2 transmitted to all facilities in geographic area requested by pt/family on 04/27/15     FL2 transmitted to all facilities within larger geographic area on       Patient informed that his/her managed care company has contracts with or will negotiate with certain facilities, including the following:            Patient/family informed of bed offers received.  Patient chooses bed at       Physician recommends and patient chooses bed at      Patient to be transferred to   on  .  Patient to be transferred to facility by       Patient family notified on   of transfer.  Name of family member notified:        PHYSICIAN Please sign FL2     Additional Comment:    _______________________________________________ Caroline Sauger, LCSW 04/27/2015, 3:49  PM

## 2015-04-27 NOTE — Progress Notes (Signed)
Lab called with critical value calcium 6.3, paged lab value to [facility] on-call K. Schorr. Will continue to monitor patient closely.    Christen Bame RN

## 2015-04-27 NOTE — Clinical Social Work Note (Signed)
Clinical Social Work Assessment  Patient Details  Name: [name] MRN: [id number] Date of Birth: 1940/06/26  Date of referral:  04/27/15               Reason for consult:  Facility Placement, Discharge Planning                Permission sought to share information with:  Facility Sport and exercise psychologist, Family Supports Permission granted to share information::  Yes, Verbal Permission Granted  Name::     [place]  Agency::  [facility] [place] Ritta Slot first choice)  Relationship::  Daughter  Contact Information:  [phone]  Housing/Transportation Living arrangements for the past 2 months:  [place] of Information:  Adult Children Patient Interpreter Needed:  None Criminal Activity/Legal Involvement Pertinent to Current Situation/Hospitalization:  No - Comment as needed Significant Relationships:  Adult Children Lives with:  Self Do you feel safe going back to the place where you live?  No Need for family participation in patient care:  Yes (Comment) (Patient's daughter active in patient's care.)  Care giving concerns:  Patient's daughter expressed no concern at this time.   Social Worker assessment / plan:  CSW received referral for possible SNF placement at time of discharge. CSW spoke with patient's daughter regarding discharge disposition. Per patient's daughter, family would prefer for patient to be discharged to [facility] once patient medically stable for discharge. CSW to continue to follow and assist with discharge planning needs.  Employment status:  Retired Science writer) PT Recommendations:  [place] / Referral to community resources:  [place]  Patient/Family's Response to care:  Patient's family understanding and agreeable to CSW plan of care.  Patient/Family's Understanding of and Emotional Response to Diagnosis, Current Treatment, and  Prognosis:  Patient's family understanding and agreeable to CSW plan of care.  Emotional Assessment Appearance:  Other (Comment Required (CSW spoke with patient's daughter.) Attitude/Demeanor/Rapport:   (CSW spoke with patient's daughter.) Affect (typically observed):   (CSW spoke with patient's daughter.) Orientation:  Oriented to Self, Oriented to Place, Oriented to Situation, Oriented to  Time Alcohol / Substance use:  Not Applicable Psych involvement (Current and /or in the community):  No (Comment) (Not appropriate on this admission.)  Discharge Needs  Concerns to be addressed:  No discharge needs identified Readmission within the last 30 days:  No Current discharge risk:  None Barriers to Discharge:  No Barriers Identified   Caroline Sauger, LCSW 04/27/2015, 3:45 PM

## 2015-04-27 NOTE — Progress Notes (Signed)
PROGRESS NOTE    [name]  [id number] DOB: November 13, 1940 DOA: 04/24/2015  PCP: Leamon Arnt, MD    Brief Narrative:   [name] is a 75 y.o. female with pmh of DM, HLD, HTN. Patient presents to the ED after she fell while walking on a treadmill which was speeding up. She suffered immediate severe R knee pain, deformity of the leg with shortening and internal rotation, and inability to walk.  Assessment & Plan:   Principal Problem:   Fracture, femur, distal, right, closed, initial encounter - s/p fixation on 4/21 by ortho, touch down weight bearing and ASA 81 mg BID and Aggrenox for DVT prophylaxis - remove foley today and get OOB - Was getting a great deal of Dilaudid, Morphine and vicodin and appeared groggy-   transitioned to Oxycodone only  Active Problems: AKI on CKD 3 - did not receive contrast or diuretics -no severe hypotension noted-  HCTZ held on admission - last dose of Norvasc 4/20 and Lisinopril on 4/21 - does not appear to be prerenal  Low grade fever 100.2 on 4/21 night and leukocytosis - may just be surgery and stress related- - CXR only shows mild atelectasis vs pneumonia in LLL- f/u UA- hold off on antibiotics- start IS - she received Vanc and Clinda prior to surgery  - no cough, nausea, or vomiting-    - changed Hydrocodone/ Acetaminophen to Oxycodone so fevers are not masked  Anemia - Hb 9.5 on 4/21 >> 8.2 after 1 U PRBC on 4/21>>mild drop to 7.8 today- may just be lab discrepancy     DM2 (diabetes mellitus, type 2) - on ISS-hold Metformin    HTN (hypertension)  - holding Norvasc and Lisinopril/ HCTZ as BP low  Morbid obesity Body mass index is 44.56 kg/(m^2).  DVT prophylaxis: ASA 81 mg BID  + Aggrenox (per ortho) Code Status: full code Family Communication: daughter Sharman Crate  Disposition Plan: probably SNF- awaiting PT eval   Consultants:   ortho  Procedures:   Intramedullary fixation of right periprosthetic distal femur  fracture.  Antimicrobials:  Anti-infectives    Start     Dose/Rate Route Frequency Ordered Stop   04/25/15 1430  clindamycin (CLEOCIN) IVPB 600 mg     600 mg 100 mL/hr over 30 Minutes Intravenous Every 6 hours 04/25/15 1314 04/25/15 2101   04/25/15 0815  vancomycin (VANCOCIN) 1,500 mg in sodium chloride 0.9 % 500 mL IVPB     1,500 mg 250 mL/hr over 120 Minutes Intravenous On call to O.R. 04/25/15 0804 04/25/15 0900   04/25/15 0815  clindamycin (CLEOCIN) IVPB 900 mg     900 mg 100 mL/hr over 30 Minutes Intravenous On call to O.R. 04/25/15 0804 04/25/15 0900      Subjective: No nausea, vomiting, cough, dyspnea or sore throat. Ate a good breakfast this AM.   Objective: Filed Vitals:   04/26/15 2150 04/27/15 0510 04/27/15 0512 04/27/15 0600  BP:  85/21 96/24 116/62  Pulse:  92    Temp: 99.2 F (37.3 C) 99.3 F (37.4 C)    TempSrc:  Oral    Resp:  16    Height:      Weight:      SpO2:  98%      Intake/Output Summary (Last 24 hours) at 04/27/15 0832 Last data filed at 04/27/15 0512  Gross per 24 hour  Intake   1080 ml  Output    845 ml  Net    235 ml  Filed Weights   04/24/15 0614  Weight: 117.8 kg (259 lb 11.2 oz)    Examination: General exam: Appears sleepy, dazed- took some time to answer my questions- comfortable Respiratory system: Clear to auscultation. Respiratory effort normal. Cardiovascular system: S1 & S2 heard, RRR. No JVD, murmurs, rubs, gallops or clicks. No pedal edema. Gastrointestinal system: Abdomen is nondistended, soft and nontender. No organomegaly or masses felt. Normal bowel sounds heard. Central nervous system: Alert and oriented. No focal neurological deficits. Extremities: left hand slightly swollen- right leg in ace bandage Skin: No rashes, lesions or ulcers   Data Reviewed: I have personally reviewed following labs and imaging studies  CBC:  Recent Labs Lab 04/24/15 0036 04/26/15 0415 04/27/15 0430  WBC 13.6* 20.0* 22.2*    NEUTROABS 11.0*  --   --   HGB 9.5* 8.2* 7.8*  HCT 29.4* 24.8* 23.4*  MCV 91.3 91.2 91.1  PLT 291 184 [id number]   Basic Metabolic Panel:  Recent Labs Lab 04/24/15 0036 04/26/15 0415 04/27/15 0430  NA 142 139 141  K 4.2 4.1 4.6  CL 108 105 109  CO2 26 24 24   GLUCOSE 160* 158* 162*  BUN 27* 16 22*  CREATININE 1.47* 1.29* 1.72*  CALCIUM 8.0* 6.8* 6.4*   GFR: Estimated Creatinine Clearance: 35.1 mL/min (by C-G formula based on Cr of 1.72). Liver Function Tests: No results for input(s): AST, ALT, ALKPHOS, BILITOT, PROT, ALBUMIN in the last 168 hours. No results for input(s): LIPASE, AMYLASE in the last 168 hours. No results for input(s): AMMONIA in the last 168 hours. Coagulation Profile:  Recent Labs Lab 04/24/15 0036  INR 0.95   Cardiac Enzymes: No results for input(s): CKTOTAL, CKMB, CKMBINDEX, TROPONINI in the last 168 hours. BNP (last 3 results) No results for input(s): PROBNP in the last 8760 hours. HbA1C: No results for input(s): HGBA1C in the last 72 hours. CBG:  Recent Labs Lab 04/26/15 0031 04/26/15 0445 04/26/15 0726 04/26/15 1214 04/26/15 1658  GLUCAP 151* 152* 155* 161* 172*   Lipid Profile: No results for input(s): CHOL, HDL, LDLCALC, TRIG, CHOLHDL, LDLDIRECT in the last 72 hours. Thyroid Function Tests: No results for input(s): TSH, T4TOTAL, FREET4, T3FREE, THYROIDAB in the last 72 hours. Anemia Panel: No results for input(s): VITAMINB12, FOLATE, FERRITIN, TIBC, IRON, RETICCTPCT in the last 72 hours. Urine analysis:    Component Value Date/Time   COLORURINE YELLOW 06/24/2014 1243   APPEARANCEUR CLEAR 06/24/2014 1243   LABSPEC 1.009 06/24/2014 1243   PHURINE 5.0 06/24/2014 1243   GLUCOSEU NEGATIVE 06/24/2014 1243   HGBUR TRACE* 06/24/2014 1243   [place] 06/24/2014 1243   KETONESUR NEGATIVE 06/24/2014 1243   PROTEINUR NEGATIVE 06/24/2014 1243   UROBILINOGEN 0.2 06/24/2014 1243   NITRITE NEGATIVE 06/24/2014 1243   LEUKOCYTESUR  NEGATIVE 06/24/2014 1243   Sepsis Labs: @LABRCNTIP (procalcitonin:4,lacticidven:4)  ) Recent Results (from the past 240 hour(s))  MRSA PCR Screening     Status: None   Collection Time: 04/24/15  5:34 PM  Result Value Ref Range Status   MRSA by PCR NEGATIVE NEGATIVE Final    Comment:        The GeneXpert MRSA Assay (FDA approved for NASAL specimens only), is one component of a comprehensive MRSA colonization surveillance program. It is not intended to diagnose MRSA infection nor to guide or monitor treatment for MRSA infections.          Radiology Studies: Dg C-arm 1-60 Min-no Report  04/25/2015  CLINICAL DATA: surgery C-ARM 1-60 MINUTES Fluoroscopy was  utilized by the requesting physician.  No radiographic interpretation.   Dg Femur, Min 2 Views Right  04/25/2015  CLINICAL DATA:  IM nail EXAM: RIGHT FEMUR 2 VIEWS; DG C-ARM 1-60 MIN-NO REPORT COMPARISON:  04/24/2015 FINDINGS: Multiple intraoperative spot images demonstrate placement of right femoral IM nail across the distal femoral metaphyseal fracture. Near anatomic alignment. Changes from remote right knee replacement. IMPRESSION: Internal fixation across the distal right femoral fracture. Near anatomic alignment. No visible complicating feature. Electronically Signed   By: Rolm Baptise M.D.   On: 04/25/2015 11:15   Dg Femur Port, Min 2 Views Right  04/25/2015  CLINICAL DATA:  Screw and nail fixation for distal femur fracture EXAM: RIGHT FEMUR PORTABLE 1 VIEW COMPARISON:  April 24, 2015 FINDINGS: Frontal and lateral views were obtained. There is screw and nail fixation through a comminuted fracture of the distal fibular diaphysis and metaphysis with alignment of the fracture fragments in essentially anatomic alignment. There is also a total knee replacement on the left with prosthetic components appearing well-seated. No new fracture. No dislocation. Air in the soft tissues is an expected postoperative finding. IMPRESSION:  Alignment essentially anatomic at the distal femoral fracture site with screw and nail fixation. The total knee prosthetic components appear well seated. No new fracture. No dislocation. Electronically Signed   By: Lowella Grip III M.D.   On: 04/25/2015 12:46        Scheduled Meds: . aspirin  81 mg Oral BID  . atorvastatin  20 mg Oral Daily  . calcitRIOL  0.5 mcg Oral Daily  . dipyridamole-aspirin  1 capsule Oral BID  . docusate sodium  100 mg Oral BID  . insulin aspart  0-9 Units Subcutaneous TID WC  . levothyroxine  137 mcg Oral QAC breakfast  . pregabalin  150 mg Oral BID  . senna  1 tablet Oral BID   Continuous Infusions:    LOS: 3 days    Time spent in minutes: 23    [place], MD [facility] Pager: [email or web address] Password [facility] 04/27/2015, 8:32 AM

## 2015-04-27 NOTE — Progress Notes (Signed)
   Subjective: 2 Days Post-Op Procedure(s) (LRB): INTRAMEDULLARY (IM) RIGHT  RETROGRADE FEMORAL NAILING (Right)  Pt resting but awakened by voice C/o moderate pain to right knee/lower leg this morning Therapy has been difficult  Denies any new symptoms or issues Patient reports pain as moderate.  Objective:   VITALS:   Filed Vitals:   04/27/15 0512 04/27/15 0600  BP: 96/24 116/62  Pulse:    Temp:    Resp:      Right knee dressing intact and knee immobilizer nv intact distally No rashes   LABS  Recent Labs  04/26/15 0415 04/27/15 0430  HGB 8.2* 7.8*  HCT 24.8* 23.4*  WBC 20.0* 22.2*  PLT 184 186     Recent Labs  04/26/15 0415 04/27/15 0430  NA 139 141  K 4.1 4.6  BUN 16 22*  CREATININE 1.29* 1.72*  GLUCOSE 158* 162*     Assessment/Plan: 2 Days Post-Op Procedure(s) (LRB): INTRAMEDULLARY (IM) RIGHT  RETROGRADE FEMORAL NAILING (Right) Acute blood loss anemia - pt has drifted down to 7.8, will defer to medical team for management and transfusion PT/OT as able D/c planning for SNF in 1-2 days Pain management Pulmonary toilet - remains on oxygen currently   Merla Riches, MPAS, PA-C  04/27/2015, 7:43 AM

## 2015-04-28 ENCOUNTER — Inpatient Hospital Stay (HOSPITAL_COMMUNITY): Payer: Medicare Other

## 2015-04-28 DIAGNOSIS — M9711XS Periprosthetic fracture around internal prosthetic right knee joint, sequela: Secondary | ICD-10-CM

## 2015-04-28 DIAGNOSIS — N179 Acute kidney failure, unspecified: Secondary | ICD-10-CM

## 2015-04-28 DIAGNOSIS — D62 Acute posthemorrhagic anemia: Secondary | ICD-10-CM

## 2015-04-28 DIAGNOSIS — J9601 Acute respiratory failure with hypoxia: Secondary | ICD-10-CM

## 2015-04-28 LAB — CBC
HCT: 18.9 % — ABNORMAL LOW (ref 36.0–46.0)
Hemoglobin: 6.3 g/dL — CL (ref 12.0–15.0)
MCH: 29.3 pg (ref 26.0–34.0)
MCHC: 33.3 g/dL (ref 30.0–36.0)
MCV: 87.9 fL (ref 78.0–100.0)
PLATELETS: 198 10*3/uL (ref 150–400)
RBC: 2.15 MIL/uL — AB (ref 3.87–5.11)
RDW: 14.8 % (ref 11.5–15.5)
WBC: 17 10*3/uL — AB (ref 4.0–10.5)

## 2015-04-28 LAB — TYPE AND SCREEN
ABO/RH(D): AB POS
ANTIBODY SCREEN: NEGATIVE
UNIT DIVISION: 0
UNIT DIVISION: 0

## 2015-04-28 LAB — BASIC METABOLIC PANEL
ANION GAP: 7 (ref 5–15)
BUN: 30 mg/dL — ABNORMAL HIGH (ref 6–20)
CO2: 23 mmol/L (ref 22–32)
Calcium: 6 mg/dL — CL (ref 8.9–10.3)
Chloride: 106 mmol/L (ref 101–111)
Creatinine, Ser: 1.89 mg/dL — ABNORMAL HIGH (ref 0.44–1.00)
GFR calc Af Amer: 29 mL/min — ABNORMAL LOW (ref >60)
GFR, EST NON AFRICAN AMERICAN: 25 mL/min — AB (ref >60)
Glucose, Bld: 173 mg/dL — ABNORMAL HIGH (ref 65–99)
POTASSIUM: 4.5 mmol/L (ref 3.5–5.1)
SODIUM: 136 mmol/L (ref 135–145)

## 2015-04-28 LAB — GLUCOSE, CAPILLARY
GLUCOSE-CAPILLARY: 137 mg/dL — AB (ref 65–99)
GLUCOSE-CAPILLARY: 145 mg/dL — AB (ref 65–99)
GLUCOSE-CAPILLARY: 161 mg/dL — AB (ref 65–99)
GLUCOSE-CAPILLARY: 161 mg/dL — AB (ref 65–99)

## 2015-04-28 LAB — PREPARE RBC (CROSSMATCH)

## 2015-04-28 MED ORDER — MENTHOL 3 MG MT LOZG: 1.0000 | LOZENGE | OROMUCOSAL | Status: DC | PRN

## 2015-04-28 MED ORDER — SODIUM CHLORIDE 0.9 % IV SOLN
INTRAVENOUS | Status: DC
  Administered 2015-04-28 – 2015-04-29 (×2): via INTRAVENOUS

## 2015-04-28 MED ORDER — ALUM & MAG HYDROXIDE-SIMETH 200-200-20 MG/5ML PO SUSP
30.0000 mL | Freq: Four times a day (QID) | ORAL | Status: DC | PRN
  Administered 2015-04-29: 30 mL via ORAL
  Filled 2015-04-28: qty 30

## 2015-04-28 MED ORDER — PHENOL 1.4 % MT LIQD: 1.0000 | OROMUCOSAL | Status: DC | PRN

## 2015-04-28 MED ORDER — OXYCODONE HCL 5 MG PO TABS
5.0000 mg | ORAL_TABLET | ORAL | Status: DC | PRN
  Administered 2015-04-28 – 2015-04-29 (×4): 5 mg via ORAL
  Filled 2015-04-28 (×4): qty 1

## 2015-04-28 MED ORDER — PRAMOXINE-ZINC OXIDE IN MO 1-12.5 % RE OINT
1.0000 "application " | TOPICAL_OINTMENT | Freq: Three times a day (TID) | RECTAL | Status: DC | PRN
  Filled 2015-04-28: qty 28.3

## 2015-04-28 MED ORDER — LEVOFLOXACIN 750 MG PO TABS: 750.0000 mg | ORAL_TABLET | ORAL | Status: DC

## 2015-04-28 MED ORDER — LEVOFLOXACIN IN D5W 750 MG/150ML IV SOLN
750.0000 mg | INTRAVENOUS | Status: DC
  Administered 2015-04-28: 750 mg via INTRAVENOUS
  Filled 2015-04-28: qty 150

## 2015-04-28 MED ORDER — GUAIFENESIN-DM 100-10 MG/5ML PO SYRP: 10.0000 mL | ORAL_SOLUTION | ORAL | Status: DC | PRN

## 2015-04-28 MED ORDER — SODIUM CHLORIDE 0.9 % IV BOLUS (SEPSIS)
500.0000 mL | Freq: Once | INTRAVENOUS | Status: AC
  Administered 2015-04-28: 500 mL via INTRAVENOUS

## 2015-04-28 MED ORDER — SODIUM CHLORIDE 0.9 % IV SOLN: Freq: Once | INTRAVENOUS | Status: DC

## 2015-04-28 NOTE — Progress Notes (Signed)
Pharmacy Antibiotic Note  [name] is a 75 y.o. female admitted on 04/24/2015 with HCAP.  Pharmacy has been consulted for levofloxacin dosing. Pt has had elevated temp and WBC. Pt was admitted after fall and was diagnosed with distal right femur fracture. Pt s/p procedure on 4/21.   Plan: Levofloxacin 750 mg IV q48h.  Height: 5\' 4"  (162.6 cm) Weight: 259 lb 11.2 oz (117.8 kg) IBW/kg (Calculated) : 54.7  Temp (24hrs), Avg:100.3 F (37.9 C), Min:98.2 F (36.8 C), Max:101.5 F (38.6 C)   Recent Labs Lab 04/24/15 0036 04/26/15 0415 04/27/15 0430 04/27/15 0836 04/28/15 0415  WBC 13.6* 20.0* 22.2* 21.9* 17.0*  CREATININE 1.47* 1.29* 1.72*  --  1.89*    Estimated Creatinine Clearance: 31.9 mL/min (by C-G formula based on Cr of 1.89).    Allergies  Allergen Reactions  . Actos [Pioglitazone Hydrochloride] Swelling  . Ativan [Lorazepam]     swelling  . Cephalexin Other (See Comments)    Doesn't remember   . Oxycodone     This medication makes her feel sick  . Rosiglitazone     unknown  . Tramadol Other (See Comments)    hallucinations    Antimicrobials this admission: 4/21 clindamycin >> 4/21 (pre-op and post-op doses) 4/24 levofloxacin >>   Dose adjustments this admission: ---  Microbiology results: 4/20 MRSA PCR: negative  Thank you for allowing pharmacy to be a part of this patient's care.   Royetta Asal, PharmD, BCPS Pager [phone] 04/28/2015 9:36 AM

## 2015-04-28 NOTE — Progress Notes (Addendum)
PROGRESS NOTE    [name]  [id number] DOB: March 17, 1940 DOA: 04/24/2015  PCP: Leamon Arnt, MD    Brief Narrative:   [name] is a 75 y.o. female with pmh of DM, HLD, HTN. Patient presents to the ED after she fell while walking on a treadmill which was speeding up. She suffered immediate severe R knee pain, deformity of the leg with shortening and internal rotation, and inability to walk.  Assessment & Plan:   Principal Problem:   Fracture, femur, distal, right, closed, initial encounter - s/p fixation on 4/21 by ortho, touch down weight bearing and ASA 81 mg BID and Aggrenox for DVT prophylaxis - remove foley today and get OOB - Was getting a great deal of Dilaudid, Morphine and vicodin and appeared groggy-   transitioned to Oxycodone only- states pain well controlled today  Active Problems: AKI on CKD 3 - did not receive contrast or diuretics -no severe hypotension noted-  HCTZ held on admission - last dose of Norvasc 4/20 and Lisinopril on 4/21 - no urine output since foley removed last night- not much urine in bladder - has received 500 cc bolus overnight and now receiving 2 U PRBC- follow closely   Low grade fever 100.2 on 4/21 night and leukocytosis - may just be surgery and stress related- - 4/22 CXR only shows mild atelectasis vs pneumonia in LLL- f/u UA- hold off on antibiotics- start IS - she received Vanc and Clinda prior to surgery  - changed Hydrocodone/ Acetaminophen to Oxycodone so fevers are not masked - fevers now up to 101- per RN pulse ox drops to 70s on room air- CXR shows mild opacities in b/l bases- will start Levaquin for possible pneumonia- doing incentive spirometry- UA negative- have spoken with Ortho who states that knee does not appear infected  Anemia due to acute blood loss - Hb 9.5 on 4/21 >> 8.2 after 1 U PRBC on 4/21>> drop to 6.3 today- transfusing 2 U PRBC today    DM2 (diabetes mellitus, type 2) - on ISS-hold Metformin   HTN (hypertension)  - holding Norvasc and Lisinopril/ HCTZ as BP low  Morbid obesity Body mass index is 44.56 kg/(m^2).  DVT prophylaxis: ASA 81 mg BID  + Aggrenox (per ortho) Code Status: full code Family Communication: daughter Sharman Crate  Disposition Plan: probably SNF- awaiting PT eval   Consultants:   ortho  Procedures:   Intramedullary fixation of right periprosthetic distal femur fracture.  Antimicrobials:  Anti-infectives    Start     Dose/Rate Route Frequency Ordered Stop   04/28/15 1000  levofloxacin (LEVAQUIN) IVPB 750 mg     750 mg 100 mL/hr over 90 Minutes Intravenous Every 48 hours 04/28/15 0859     04/25/15 1430  clindamycin (CLEOCIN) IVPB 600 mg     600 mg 100 mL/hr over 30 Minutes Intravenous Every 6 hours 04/25/15 1314 04/25/15 2101   04/25/15 0815  vancomycin (VANCOCIN) 1,500 mg in sodium chloride 0.9 % 500 mL IVPB     1,500 mg 250 mL/hr over 120 Minutes Intravenous On call to O.R. 04/25/15 0804 04/25/15 0900   04/25/15 0815  clindamycin (CLEOCIN) IVPB 900 mg     900 mg 100 mL/hr over 30 Minutes Intravenous On call to O.R. 04/25/15 0804 04/25/15 0900      Subjective: No complaints today. Not much pain in her right knee. Has not gotten out of bed in 2 days.   Objective: Filed Vitals:   04/28/15 0932  04/28/15 1052 04/28/15 1232 04/28/15 1330  BP: 119/42 117/56 115/51 124/58  Pulse: 89 79 79 82  Temp: 101.2 F (38.4 C) 99.2 F (37.3 C) 99 F (37.2 C) 99.2 F (37.3 C)  TempSrc: Oral Oral Axillary Oral  Resp: 20 18 18 16   Height:      Weight:      SpO2: 97% 95% 99% 95%    Intake/Output Summary (Last 24 hours) at 04/28/15 1603 Last data filed at 04/28/15 1318  Gross per 24 hour  Intake   2140 ml  Output     40 ml  Net   2100 ml   Filed Weights   04/24/15 0614  Weight: 117.8 kg (259 lb 11.2 oz)    Examination: General exam: alert, comfortable Respiratory system: Clear to auscultation. Respiratory effort normal. Cardiovascular system: S1  & S2 heard, RRR. No JVD, murmurs, rubs, gallops or clicks. No pedal edema. Gastrointestinal system: Abdomen is nondistended, soft and nontender. No organomegaly or masses felt. Normal bowel sounds heard. Central nervous system: Alert and oriented. No focal neurological deficits. Extremities: left hand slightly swollen- right leg in ace bandage Skin: No rashes, lesions or ulcers   Data Reviewed: I have personally reviewed following labs and imaging studies  CBC:  Recent Labs Lab 04/24/15 0036 04/26/15 0415 04/27/15 0430 04/27/15 0836 04/28/15 0415  WBC 13.6* 20.0* 22.2* 21.9* 17.0*  NEUTROABS 11.0*  --   --  18.8*  --   HGB 9.5* 8.2* 7.8* 7.8* 6.3*  HCT 29.4* 24.8* 23.4* 23.9* 18.9*  MCV 91.3 91.2 91.1 88.8 87.9  PLT 291 184 186 199 [id number]   Basic Metabolic Panel:  Recent Labs Lab 04/24/15 0036 04/26/15 0415 04/27/15 0430 04/28/15 0415  NA 142 139 141 136  K 4.2 4.1 4.6 4.5  CL 108 105 109 106  CO2 26 24 24 23   GLUCOSE 160* 158* 162* 173*  BUN 27* 16 22* 30*  CREATININE 1.47* 1.29* 1.72* 1.89*  CALCIUM 8.0* 6.8* 6.4* 6.0*   GFR: Estimated Creatinine Clearance: 31.9 mL/min (by C-G formula based on Cr of 1.89). Liver Function Tests: No results for input(s): AST, ALT, ALKPHOS, BILITOT, PROT, ALBUMIN in the last 168 hours. No results for input(s): LIPASE, AMYLASE in the last 168 hours. No results for input(s): AMMONIA in the last 168 hours. Coagulation Profile:  Recent Labs Lab 04/24/15 0036  INR 0.95   Cardiac Enzymes: No results for input(s): CKTOTAL, CKMB, CKMBINDEX, TROPONINI in the last 168 hours. BNP (last 3 results) No results for input(s): PROBNP in the last 8760 hours. HbA1C: No results for input(s): HGBA1C in the last 72 hours. CBG:  Recent Labs Lab 04/27/15 1153 04/27/15 1703 04/27/15 2221 04/28/15 0729 04/28/15 1211  GLUCAP 198* 148* 145* 137* 145*   Lipid Profile: No results for input(s): CHOL, HDL, LDLCALC, TRIG, CHOLHDL, LDLDIRECT in the  last 72 hours. Thyroid Function Tests: No results for input(s): TSH, T4TOTAL, FREET4, T3FREE, THYROIDAB in the last 72 hours. Anemia Panel: No results for input(s): VITAMINB12, FOLATE, FERRITIN, TIBC, IRON, RETICCTPCT in the last 72 hours. Urine analysis:    Component Value Date/Time   COLORURINE YELLOW 04/27/2015 1530   APPEARANCEUR TURBID* 04/27/2015 1530   LABSPEC 1.019 04/27/2015 1530   PHURINE 5.0 04/27/2015 1530   GLUCOSEU NEGATIVE 04/27/2015 1530   HGBUR NEGATIVE 04/27/2015 1530   BILIRUBINUR NEGATIVE 04/27/2015 1530   KETONESUR NEGATIVE 04/27/2015 1530   PROTEINUR 30* 04/27/2015 1530   UROBILINOGEN 0.2 06/24/2014 1243   NITRITE NEGATIVE 04/27/2015  [place] 04/27/2015 1530   Sepsis Labs: @LABRCNTIP (procalcitonin:4,lacticidven:4)  ) Recent Results (from the past 240 hour(s))  MRSA PCR Screening     Status: None   Collection Time: 04/24/15  5:34 PM  Result Value Ref Range Status   MRSA by PCR NEGATIVE NEGATIVE Final    Comment:        The GeneXpert MRSA Assay (FDA approved for NASAL specimens only), is one component of a comprehensive MRSA colonization surveillance program. It is not intended to diagnose MRSA infection nor to guide or monitor treatment for MRSA infections.          Radiology Studies: Dg Chest Port 1 View  04/28/2015  CLINICAL DATA:  75 year old female with a history of postop fever EXAM: PORTABLE CHEST 1 VIEW COMPARISON:  04/27/2015, 04/24/2015 FINDINGS: Cardiomediastinal silhouette unchanged. Atherosclerotic calcifications of the aortic arch. Low lung volumes with mixed linear and nodular opacities at the bases. Retrocardiac region not well visualized. Blunting of the bilateral costophrenic angles. No displaced fracture.  Degenerative changes of the shoulders. IMPRESSION: Low lung volumes with mixed opacities at the bilateral lung bases, potentially atelectasis and/ or consolidation. Trace left pleural effusion not excluded.  A formal PA and lateral chest x-ray may be useful if there is concern for left basilar process. Signed, Dulcy Fanny. Earleen Newport, DO [facility] Electronically Signed   By: Corrie Mckusick D.O.   On: 04/28/2015 09:30   Dg Chest Port 1 View  04/27/2015  CLINICAL DATA:  Hypoxia EXAM: PORTABLE CHEST 1 VIEW COMPARISON:  04/24/2015 FINDINGS: Mild patchy left lower lobe opacity, atelectasis versus pneumonia. No pleural effusion or pneumothorax. Mild cardiomegaly. IMPRESSION: Mild patchy left lower lobe opacity, atelectasis versus pneumonia. Electronically Signed   By: Julian Hy M.D.   On: 04/27/2015 11:43        Scheduled Meds: . sodium chloride   Intravenous Once  . aspirin  81 mg Oral BID  . atorvastatin  20 mg Oral Daily  . calcitRIOL  0.5 mcg Oral Daily  . dipyridamole-aspirin  1 capsule Oral BID  . docusate sodium  100 mg Oral BID  . insulin aspart  0-9 Units Subcutaneous TID WC  . levofloxacin (LEVAQUIN) IV  750 mg Intravenous Q48H  . levothyroxine  137 mcg Oral QAC breakfast  . pregabalin  150 mg Oral BID  . senna  1 tablet Oral BID   Continuous Infusions:    LOS: 4 days    Time spent in minutes: 16    [place], MD [facility] Pager: [email or web address] Password [facility] 04/28/2015, 4:03 PM

## 2015-04-28 NOTE — Progress Notes (Addendum)
   Subjective:  Patient reports pain as mild to moderate.  Received NS bolus o/n for low UOP.  Objective:   VITALS:   Filed Vitals:   04/27/15 0600 04/27/15 1452 04/27/15 1955 04/28/15 0646  BP: 116/62 123/95 106/53 100/37  Pulse:  98 90 92  Temp:  101.5 F (38.6 C) 98.2 F (36.8 C) 99.9 F (37.7 C)  TempSrc:  Oral Oral Oral  Resp:  16 17 18   Height:      Weight:      SpO2:  100% 100% 100%    ABD soft Sensation intact distally Intact pulses distally Dorsiflexion/Plantar flexion intact Incision: dressing C/D/I Compartment soft   Lab Results  Component Value Date   WBC 17.0* 04/28/2015   HGB 6.3* 04/28/2015   HCT 18.9* 04/28/2015   MCV 87.9 04/28/2015   PLT 198 04/28/2015   BMET    Component Value Date/Time   NA 136 04/28/2015 0415   K 4.5 04/28/2015 0415   CL 106 04/28/2015 0415   CO2 23 04/28/2015 0415   GLUCOSE 173* 04/28/2015 0415   BUN 30* 04/28/2015 0415   CREATININE 1.89* 04/28/2015 0415   CREATININE 1.39* 09/05/2013 1652   CALCIUM 6.0* 04/28/2015 0415   GFRNONAA 25* 04/28/2015 0415   GFRNONAA 37* 09/05/2013 1652   GFRAA 29* 04/28/2015 0415   GFRAA 43* 09/05/2013 1652     Assessment/Plan: 3 Days Post-Op   Principal Problem:   Fracture, femur, distal, right, closed, initial encounter Active Problems:   DM2 (diabetes mellitus, type 2) ([place])   HTN (hypertension)   Periprosthetic fracture around internal prosthetic right knee joint ([place])   TDWB RLE DVT ppx: aggrenox plus ASA 81 mg PO BIDPO pain control PT/OT ABLA: hgb 6.3 - hospitalist ordered 2 units PRBCs SNF placement    [name], Horald Pollen 04/28/2015, 7:32 AM   Rod Can, MD Cell [phone]

## 2015-04-28 NOTE — Progress Notes (Signed)
Patient's foley d/c'd at [id number], no void yet. Bladder scan done, result 14 cc's urine. On call hospitalist notified and order r'cvd for 500cc NS bolus. Will continue to monitor.  Christen Bame RN

## 2015-04-28 NOTE — Progress Notes (Signed)
Pt and pt daughter updated regarding bed offer for [facility] and accepting of bed offer.   Pt not yet medically ready for discharge.  CSW to continue to follow for pt discharge to [facility] when pt medically ready.  Alison Murray, MSW, LCSW Clinical Social Work Coverage for [employer], [place]

## 2015-04-28 NOTE — Clinical Social Work Placement (Signed)
Patient has a bed at [facility]. CSW has completed FL2 & will continue to follow and assist with discharge when ready.    Raynaldo Opitz, [facility] Clinical Social Worker cell #: [phone]     CLINICAL SOCIAL WORK PLACEMENT  NOTE  Date:  04/28/2015  Patient Details  Name: HESSIE ELTING MRN: [id number] Date of Birth: 1940/07/17  Clinical Social Work is seeking post-discharge placement for this patient at the [place] level of care (*CSW will initial, date and re-position this form in  chart as items are completed):  Yes   Patient/family provided with [place] Work Department's list of facilities offering this level of care within the geographic area requested by the patient (or if unable, by the patient's family).  Yes   Patient/family informed of their freedom to choose among providers that offer the needed level of care, that participate in Medicare, Medicaid or managed care program needed by the patient, have an available bed and are willing to accept the patient.  Yes   Patient/family informed of [place]'s ownership interest in [facility] and [facility], as well as of the fact that they are under no obligation to receive care at these facilities.  PASRR submitted to EDS on       PASRR number received on       Existing PASRR number confirmed on 04/27/15     FL2 transmitted to all facilities in geographic area requested by pt/family on 04/27/15     FL2 transmitted to all facilities within larger geographic area on       Patient informed that his/her managed care company has contracts with or will negotiate with certain facilities, including the following:        Yes   Patient/family informed of bed offers received.  Patient chooses bed at [facility]     Physician recommends and patient chooses bed at      Patient to be transferred to [facility] on   .  Patient to be transferred to facility by       Patient family notified on   of transfer.  Name of family member notified:        PHYSICIAN Please sign FL2     Additional Comment:    _______________________________________________ Standley Brooking, LCSW 04/28/2015, 4:37 PM

## 2015-04-28 NOTE — Progress Notes (Signed)
Physical Therapy Treatment Patient Details Name: [name] MRN: [id number] DOB: [birth year] Today's Date: 04/28/2015    History of Present Illness R distal femur peri-prothetic fx s/p IM fixation.  Pt with hx of DM, TIA, R TKR, lumbar fusion and morbid obesity    PT Comments    POD # 3 HgB 6.3 this am  Seen this afternoon pt had had only one of 2 units of blood to used Maxi Move to assist OOB to [facility] to void then to recliner.    Follow Up Recommendations  SNF     Equipment Recommendations       Recommendations for Other Services       Precautions / Restrictions Precautions Precautions: Fall Restrictions Weight Bearing Restrictions: Yes RLE Weight Bearing: Touchdown weight bearing RLE Partial Weight Bearing Percentage or Pounds: 30%    Mobility  Bed Mobility   Bed Mobility: Supine to Sit       Sit to supine: Max assist;+2 for physical assistance;+2 for safety/equipment   General bed mobility comments: long sit to place [employer] under  Transfers Overall transfer level: Needs assistance               General transfer comment: used Maxi Move to assist pt OOB to [facility] to void then to recliner.    Ambulation/Gait                 Stairs            Wheelchair Mobility    Modified Rankin (Stroke Patients Only)       Balance                                    Cognition Arousal/Alertness: Awake/alert Behavior During Therapy: WFL for tasks assessed/performed Overall Cognitive Status: Within Functional Limits for tasks assessed                      Exercises  B AP x 20 reps    General Comments        Pertinent Vitals/Pain Pain Assessment: 0-10 Pain Score: 6  Pain Location: R LE above knee Pain Descriptors / Indicators: Grimacing;Discomfort;Aching;Sore;Tender Pain Intervention(s): Monitored during session;Repositioned;Ice applied    Home Living                      Prior Function             PT Goals (current goals can now be found in the care plan section) Progress towards PT goals: Progressing toward goals    Frequency  Min 3X/week    PT Plan Current plan remains appropriate    Co-evaluation             End of Session Equipment Utilized During Treatment: Gait belt Activity Tolerance: Patient limited by fatigue;Patient limited by pain Patient left: in chair;with call bell/phone within reach     Time: 1630-1700 PT Time Calculation (min) (ACUTE ONLY): 30 min  Charges:  $Therapeutic Activity: 23-37 mins                    G Codes:      Rica Koyanagi  PTA [facility]  Acute  Rehab Pager      [phone]

## 2015-04-28 NOTE — Progress Notes (Signed)
OT Cancellation Note  Patient Details Name: [name] MRN: [id number] DOB: November 03, 1940   Cancelled Treatment:    Noted plan is SNF- will defer OT eval to SNF [facility] [place] Payton Mccallum D 04/28/2015, 9:01 AM

## 2015-04-29 DIAGNOSIS — D62 Acute posthemorrhagic anemia: Secondary | ICD-10-CM

## 2015-04-29 DIAGNOSIS — N179 Acute kidney failure, unspecified: Secondary | ICD-10-CM

## 2015-04-29 LAB — TYPE AND SCREEN
ABO/RH(D): AB POS
Antibody Screen: NEGATIVE
UNIT DIVISION: 0
UNIT DIVISION: 0

## 2015-04-29 LAB — BASIC METABOLIC PANEL
ANION GAP: 12 (ref 5–15)
BUN: 29 mg/dL — AB (ref 6–20)
CHLORIDE: 103 mmol/L (ref 101–111)
CO2: 21 mmol/L — ABNORMAL LOW (ref 22–32)
Calcium: 6.1 mg/dL — CL (ref 8.9–10.3)
Creatinine, Ser: 1.58 mg/dL — ABNORMAL HIGH (ref 0.44–1.00)
GFR calc Af Amer: 36 mL/min — ABNORMAL LOW (ref >60)
GFR calc non Af Amer: 31 mL/min — ABNORMAL LOW (ref >60)
GLUCOSE: 144 mg/dL — AB (ref 65–99)
POTASSIUM: 4.9 mmol/L (ref 3.5–5.1)
Sodium: 136 mmol/L (ref 135–145)

## 2015-04-29 LAB — CBC
HCT: 25.7 % — ABNORMAL LOW (ref 36.0–46.0)
HEMOGLOBIN: 8.6 g/dL — AB (ref 12.0–15.0)
MCH: 28.1 pg (ref 26.0–34.0)
MCHC: 33.5 g/dL (ref 30.0–36.0)
MCV: 84 fL (ref 78.0–100.0)
Platelets: 236 10*3/uL (ref 150–400)
RBC: 3.06 MIL/uL — AB (ref 3.87–5.11)
RDW: 16.5 % — ABNORMAL HIGH (ref 11.5–15.5)
WBC: 16.8 10*3/uL — AB (ref 4.0–10.5)

## 2015-04-29 LAB — GLUCOSE, CAPILLARY
GLUCOSE-CAPILLARY: 147 mg/dL — AB (ref 65–99)
GLUCOSE-CAPILLARY: 163 mg/dL — AB (ref 65–99)

## 2015-04-29 MED ORDER — BISACODYL 10 MG RE SUPP
10.0000 mg | Freq: Once | RECTAL | Status: AC
  Administered 2015-04-29: 10 mg via RECTAL
  Filled 2015-04-29: qty 1

## 2015-04-29 MED ORDER — LEVOFLOXACIN 750 MG PO TABS: 750.0000 mg | ORAL_TABLET | ORAL | Status: DC

## 2015-04-29 MED ORDER — BISACODYL 10 MG RE SUPP: 10.0000 mg | Freq: Every day | RECTAL | Status: DC | PRN

## 2015-04-29 MED ORDER — ASPIRIN 81 MG PO CHEW: 81.0000 mg | CHEWABLE_TABLET | Freq: Two times a day (BID) | ORAL | Status: DC

## 2015-04-29 MED ORDER — SENNA 8.6 MG PO TABS: 1.0000 | ORAL_TABLET | Freq: Two times a day (BID) | ORAL | Status: DC

## 2015-04-29 MED ORDER — ACETAMINOPHEN 325 MG PO TABS: 650.0000 mg | ORAL_TABLET | Freq: Four times a day (QID) | ORAL | Status: DC | PRN

## 2015-04-29 MED ORDER — METHOCARBAMOL 500 MG PO TABS: 500.0000 mg | ORAL_TABLET | Freq: Four times a day (QID) | ORAL | Status: DC | PRN

## 2015-04-29 MED ORDER — OXYCODONE HCL 5 MG PO TABS: 5.0000 mg | ORAL_TABLET | Freq: Three times a day (TID) | ORAL | Status: DC | PRN

## 2015-04-29 MED ORDER — DOCUSATE SODIUM 100 MG PO CAPS: 100.0000 mg | ORAL_CAPSULE | Freq: Two times a day (BID) | ORAL | Status: DC

## 2015-04-29 NOTE — Discharge Summary (Signed)
Physician Discharge Summary  [name] [id number] DOB: 30-Jan-1940 DOA: 04/24/2015  PCP: Leamon Arnt, MD  Admit date: 04/24/2015 Discharge date: 04/29/2015  Time spent: 60 minutes  Recommendations for Outpatient Follow-up:  1. Bmet in 3 days- resume Losartan if Cr  < 1.2 and if BP high 2. CBC in 3 days to follow WBC count 3. Give Levaquin tomorrow and again on 4/28 and then stop 4. BP daily- resume antihypertensives if high  Discharge Condition: stable    Discharge Diagnoses:  Principal Problem:   Fracture, femur, distal, right, closed, initial encounter Active Problems:   DM2 (diabetes mellitus, type 2) ([place])   HTN (hypertension)   Periprosthetic fracture around internal prosthetic right knee joint ([place])   AKI (acute kidney injury)- CKD 3   Acute blood loss anemia   History of present illness:  [name] is a 75 y.o. female with pmh of DM, HLD, HTN. Patient presents to the ED after she fell while walking on a treadmill which was speeding up. She suffered immediate severe R knee pain, deformity of the leg with shortening and internal rotation, and inability to walk.  Hospital Course:  Principal Problem:  Fracture, femur, distal, right, closed  - s/p fixation on 4/21 by ortho, touch down weight bearing and ASA 81 mg BID and Aggrenox for DVT prophylaxis - remove foley today and get OOB - Was getting a great deal of Dilaudid, Morphine and vicodin and appeared groggy- transitioned to Oxycodone only- states pain well controlled now  Active Problems: AKI on CKD 3 Cr 1.29 on 4/22  Lab Results  Component Value Date   CREATININE 1.58* 04/29/2015   CREATININE 1.89* 04/28/2015   CREATININE 1.72* 04/27/2015   - did not receive contrast or diuretics -no severe hypotension noted- HCTZ held on admission - last dose of Norvasc 4/20 and Lisinopril on 4/21 - 4/24- no urine output since foley removed last night- not much urine in bladder - has received 500 cc  bolus overnight and now receiving 2 U PRBC- Hb improve, urinating "too much" today - Cr 1.58 today- recheck in 3 days  Low grade fever 100.2 on 4/2,  Leukocytosis, hypoxia, ? Pneumonia - aspiration vs HCAP- sepsis - may just be surgery and stress related- - 4/22 CXR only shows mild atelectasis vs pneumonia in LLL- f/u UA- hold off on antibiotics- start IS - she received Vanc and Clinda prior to surgery  - changed Hydrocodone/ Acetaminophen to Oxycodone so fevers are not masked - 4/24 - fevers now up to 101- per RN pulse ox drops to 70s on room air- CXR shows mild opacities in b/l bases- will start Levaquin for possible pneumonia- doing incentive spirometry- UA negative- have spoken with Ortho who states that knee does not appear infected - 4/25- temps now down to 99, WBC 16.8-- will d/c with Levaquin for 5 days (QOD dosing)- CBC in 3 days  Anemia due to acute blood loss - Hb 9.5 on 4/21 >> 8.2 after 1 U PRBC on 4/21>> drop to 6.3 4/24- transfused 2 U PRBC- Hb up to 8.6 today   DM2 (diabetes mellitus, type 2) - on ISS-holding Metformin    HTN (hypertension) - holding Norvasc and Lisinopril/ HCTZ as BP was low  Morbid obesity Body mass index is 44.56 kg/(m^2).  Consultants:   ortho  Procedures:  Intramedullary fixation of right periprosthetic distal femur fracture.  Discharge Exam: Filed Weights   04/24/15 0614  Weight: 117.8 kg (259 lb 11.2 oz)  Filed Vitals:   04/28/15 2202 04/29/15 0510  BP: 135/54 122/58  Pulse: 87 84  Temp: 99.5 F (37.5 C) 99 F (37.2 C)  Resp: 20 20    General: AAO x 3, no distress Cardiovascular: RRR, no murmurs  Respiratory: clear to auscultation bilaterally GI: soft, non-tender, non-distended, bowel sound positive Extremities: right leg wound bandaged, 2+ edema of leg and foot  Discharge Instructions You were cared for by a hospitalist during your hospital stay. If you have any questions about your discharge medications or the care  you received while you were in the hospital after you are discharged, you can call the unit and asked to speak with the hospitalist on call if the hospitalist that took care of you is not available. Once you are discharged, your primary care physician will handle any further medical issues. Please note that NO REFILLS for any discharge medications will be authorized once you are discharged, as it is imperative that you return to your primary care physician (or establish a relationship with a primary care physician if you do not have one) for your aftercare needs so that they can reassess your need for medications and monitor your lab values.      Discharge Instructions    Discharge instructions    Complete by:  As directed   Diabetic, low sodium, low fat, high protein     Increase activity slowly    Complete by:  As directed             Medication List    STOP taking these medications        amLODipine 10 MG tablet  Commonly known as:  NORVASC     HYDROcodone-acetaminophen 5-325 MG tablet  Commonly known as:  NORCO/VICODIN     lisinopril 40 MG tablet  Commonly known as:  PRINIVIL,ZESTRIL     metFORMIN 1000 MG tablet  Commonly known as:  GLUCOPHAGE      TAKE these medications        acetaminophen 325 MG tablet  Commonly known as:  TYLENOL  Take 2 tablets (650 mg total) by mouth every 6 (six) hours as needed for mild pain, fever or headache (or Fever >/= 101).     alendronate 70 MG tablet  Commonly known as:  FOSAMAX  Take 70 mg by mouth every 7 (seven) days. Monday. Take with a full glass of water on an empty stomach.     aspirin 81 MG chewable tablet  Chew 1 tablet (81 mg total) by mouth 2 (two) times daily.     bisacodyl 10 MG suppository  Commonly known as:  DULCOLAX  Place 1 suppository (10 mg total) rectally daily as needed for moderate constipation.     calcitRIOL 0.25 MCG capsule  Commonly known as:  ROCALTROL  Take 0.5 mcg by mouth daily.     calcium-vitamin  D 500-200 MG-UNIT tablet  Commonly known as:  OSCAL WITH D  Take 1 tablet by mouth daily with breakfast.     dipyridamole-aspirin 200-25 MG 12hr capsule  Commonly known as:  AGGRENOX  Take 1 capsule by mouth 2 (two) times daily.     docusate sodium 100 MG capsule  Commonly known as:  COLACE  Take 1 capsule (100 mg total) by mouth 2 (two) times daily.     fluticasone 50 MCG/ACT nasal spray  Commonly known as:  FLONASE  Place 1 spray into the nose daily as needed for allergies.     hydrochlorothiazide 25  MG tablet  Commonly known as:  HYDRODIURIL  Take 25 mg by mouth daily.     levofloxacin 750 MG tablet  Commonly known as:  LEVAQUIN  Take 1 tablet (750 mg total) by mouth every other day.  Start taking on:  04/30/2015     levothyroxine 137 MCG tablet  Commonly known as:  SYNTHROID, LEVOTHROID  Take 137 mcg by mouth daily before breakfast.     LIPITOR 20 MG tablet  Generic drug:  atorvastatin  Take 20 mg by mouth daily.     LYRICA 150 MG capsule  Generic drug:  pregabalin  Take 150 mg by mouth twice daily     methocarbamol 500 MG tablet  Commonly known as:  ROBAXIN  Take 1 tablet (500 mg total) by mouth every 6 (six) hours as needed for muscle spasms.     oxyCODONE 5 MG immediate release tablet  Commonly known as:  Oxy IR/ROXICODONE  Take 1 tablet (5 mg total) by mouth every 8 (eight) hours as needed for severe pain.     senna 8.6 MG Tabs tablet  Commonly known as:  SENOKOT  Take 1 tablet (8.6 mg total) by mouth 2 (two) times daily.     valACYclovir 1000 MG tablet  Commonly known as:  VALTREX  TAKE 1000 MG BY MOUTH EVERY 12 HOURS AS NEEDED FOR OUTBREAK ON DAY 1       Allergies  Allergen Reactions  . Actos [Pioglitazone Hydrochloride] Swelling  . Ativan [Lorazepam]     swelling  . Cephalexin Other (See Comments)    Doesn't remember   . Oxycodone     This medication makes her feel sick  . Rosiglitazone     unknown  . Tramadol Other (See Comments)     hallucinations   Follow-up Information    Follow up with Swinteck, Horald Pollen, MD. Schedule an appointment as soon as possible for a visit in 2 weeks.   Specialty:  Orthopedic Surgery   Why:  For suture removal, For wound re-check   Contact information:   [address] [address] [phone]        The results of significant diagnostics from this hospitalization (including imaging, microbiology, ancillary and laboratory) are listed below for reference.    Significant Diagnostic Studies: Dg Chest 2 View  04/24/2015  CLINICAL DATA:  Patient fell off a treadmill with right hip pain. EXAM: CHEST  2 VIEW COMPARISON:  06/24/2014 FINDINGS: Mild cardiac enlargement without vascular congestion. No focal airspace disease or consolidation in the lungs. No blunting of costophrenic angles. No pneumothorax. Calcified and tortuous aorta. Degenerative changes in the spine and shoulders. IMPRESSION: Mild cardiac enlargement.  No evidence of active pulmonary disease. Electronically Signed   By: Lucienne Capers M.D.   On: 04/24/2015 01:25   Ct Head Wo Contrast  04/24/2015  CLINICAL DATA:  Patient fell while walking on a treadmill. EXAM: CT HEAD WITHOUT CONTRAST CT CERVICAL SPINE WITHOUT CONTRAST TECHNIQUE: Multidetector CT imaging of the head and cervical spine was performed following the standard protocol without intravenous contrast. Multiplanar CT image reconstructions of the cervical spine were also generated. COMPARISON:  CT head 06/24/2014.  MRI brain 07/05/2012. FINDINGS: CT HEAD FINDINGS Ventricles and sulci appear symmetrical. No ventricular dilatation. No significant atrophy. Mild patchy low-attenuation changes in the deep white matter suggesting mild small vessel ischemic change. No mass effect or midline shift. No abnormal extra-axial fluid collections. Gray-white matter junctions are distinct. Basal cisterns are not  effaced. No evidence of acute intracranial hemorrhage.  No depressed skull fractures. Mild mucosal thickening in the paranasal sinuses. Mastoid air cells are not opacified. Vascular calcifications. CT CERVICAL SPINE FINDINGS Straightening of the usual cervical lordosis without anterior subluxation. This is likely due to degenerative change or patient positioning but ligamentous injury or muscle spasm could also have this appearance and are not excluded. Degenerative changes in the cervical spine with narrowed cervical interspaces and endplate hypertrophic changes most prominent at C5-6. Mild degenerative changes in the facet joints. Normal alignment of the facet joints. No vertebral compression deformities. No prevertebral soft tissue swelling. C1-2 articulation appears intact. No focal bone lesion or bone destruction. Vascular calcifications. Soft tissues are unremarkable. Calcified nodule in the right supraclavicular region probably represents a calcified lymph node. IMPRESSION: No acute intracranial abnormalities. Nonspecific straightening of usual cervical lordosis. Mild degenerative changes in the cervical spine. No acute displaced fractures identified. Electronically Signed   By: Lucienne Capers M.D.   On: 04/24/2015 03:09   Ct Cervical Spine Wo Contrast  04/24/2015  CLINICAL DATA:  Patient fell while walking on a treadmill. EXAM: CT HEAD WITHOUT CONTRAST CT CERVICAL SPINE WITHOUT CONTRAST TECHNIQUE: Multidetector CT imaging of the head and cervical spine was performed following the standard protocol without intravenous contrast. Multiplanar CT image reconstructions of the cervical spine were also generated. COMPARISON:  CT head 06/24/2014.  MRI brain 07/05/2012. FINDINGS: CT HEAD FINDINGS Ventricles and sulci appear symmetrical. No ventricular dilatation. No significant atrophy. Mild patchy low-attenuation changes in the deep white matter suggesting mild small vessel ischemic change. No mass effect or midline shift. No abnormal extra-axial fluid collections.  Gray-white matter junctions are distinct. Basal cisterns are not effaced. No evidence of acute intracranial hemorrhage. No depressed skull fractures. Mild mucosal thickening in the paranasal sinuses. Mastoid air cells are not opacified. Vascular calcifications. CT CERVICAL SPINE FINDINGS Straightening of the usual cervical lordosis without anterior subluxation. This is likely due to degenerative change or patient positioning but ligamentous injury or muscle spasm could also have this appearance and are not excluded. Degenerative changes in the cervical spine with narrowed cervical interspaces and endplate hypertrophic changes most prominent at C5-6. Mild degenerative changes in the facet joints. Normal alignment of the facet joints. No vertebral compression deformities. No prevertebral soft tissue swelling. C1-2 articulation appears intact. No focal bone lesion or bone destruction. Vascular calcifications. Soft tissues are unremarkable. Calcified nodule in the right supraclavicular region probably represents a calcified lymph node. IMPRESSION: No acute intracranial abnormalities. Nonspecific straightening of usual cervical lordosis. Mild degenerative changes in the cervical spine. No acute displaced fractures identified. Electronically Signed   By: Lucienne Capers M.D.   On: 04/24/2015 03:09   Ct Femur Right Wo Contrast  04/24/2015  CLINICAL DATA:  Fall. Distal femoral fracture on right knee radiographs. EXAM: CT OF THE RIGHT FEMUR WITHOUT CONTRAST TECHNIQUE: Multidetector CT imaging was performed according to the standard protocol. Multiplanar CT image reconstructions were also generated. COMPARISON:  Right knee 04/24/2015 FINDINGS: There is a right total knee arthroplasty. Components appear grossly well-seated allowing for streak artifact. There is a mostly transverse comminuted fracture of the distal right femur at the meta diaphysis. There is about 13 mm posterior displacement of the distal fracture fragment  with about 16 mm impaction of the distal fracture fragment into the proximal fragment. The fracture line itself does not definitely extend to the femoral prosthesis, however the anterior fractured margin of the proximal fracture fragment impacts on to the  superior aspect of the femoral prosthesis. Visualized portion of the proximal tibia and fibula appear intact. Right hip and proximal femur appear intact. Soft tissue infiltration around the area of the fracture consistent with hematoma. Vascular calcifications. IMPRESSION: Mostly transverse comminuted fracture of the right femoral meta diaphysis with impaction and posterior displacement of the distal fracture fragment. Electronically Signed   By: Lucienne Capers M.D.   On: 04/24/2015 03:29   Dg Chest Port 1 View  04/28/2015  CLINICAL DATA:  75 year old female with a history of postop fever EXAM: PORTABLE CHEST 1 VIEW COMPARISON:  04/27/2015, 04/24/2015 FINDINGS: Cardiomediastinal silhouette unchanged. Atherosclerotic calcifications of the aortic arch. Low lung volumes with mixed linear and nodular opacities at the bases. Retrocardiac region not well visualized. Blunting of the bilateral costophrenic angles. No displaced fracture.  Degenerative changes of the shoulders. IMPRESSION: Low lung volumes with mixed opacities at the bilateral lung bases, potentially atelectasis and/ or consolidation. Trace left pleural effusion not excluded. A formal PA and lateral chest x-ray may be useful if there is concern for left basilar process. Signed, Dulcy Fanny. Earleen Newport, DO [facility] Electronically Signed   By: Corrie Mckusick D.O.   On: 04/28/2015 09:30   Dg Chest Port 1 View  04/27/2015  CLINICAL DATA:  Hypoxia EXAM: PORTABLE CHEST 1 VIEW COMPARISON:  04/24/2015 FINDINGS: Mild patchy left lower lobe opacity, atelectasis versus pneumonia. No pleural effusion or pneumothorax. Mild cardiomegaly. IMPRESSION: Mild patchy  left lower lobe opacity, atelectasis versus pneumonia. Electronically Signed   By: Julian Hy M.D.   On: 04/27/2015 11:43   Dg Knee Complete 4 Views Right  04/24/2015  CLINICAL DATA:  Fall off a treadmill yesterday, pain above the right knee. EXAM: RIGHT KNEE - COMPLETE 4+ VIEW COMPARISON:  None. FINDINGS: Right knee arthroplasty in expected alignment. There is a periprosthetic lucency of the distal femur are with apex anterior angulation. Fracture extends to the anterior aspect of the femoral component. Ossific densities about the medial joint line are likely chronic. Ossific density projecting over Hoffa's fat pad on the lateral view, etiology uncertain, and may reflect these medial densities. Small joint effusion. IMPRESSION: Displaced periprosthetic fracture of the distal femur extending to the superior aspect of the femoral component of right knee arthroplasty. Electronically Signed   By: Jeb Levering M.D.   On: 04/24/2015 03:04   Dg C-arm 1-60 Min-no Report  04/25/2015  CLINICAL DATA: surgery C-ARM 1-60 MINUTES Fluoroscopy was utilized by the requesting physician.  No radiographic interpretation.   Dg Hip Unilat  With Pelvis 2-3 Views Right  04/24/2015  CLINICAL DATA:  Patient fell of the treadmill. Anterior hip pain radiating to the knee. EXAM: DG HIP (WITH OR WITHOUT PELVIS) 2-3V RIGHT COMPARISON:  05/29/2014 FINDINGS: Degenerative changes in both hips. No evidence of acute fracture or dislocation. Pelvis appears intact. SI joints and symphysis pubis are not displaced. Postoperative changes in the lower lumbar spine and anterior pelvis. IMPRESSION: Mild degenerative changes in the right hip. No acute bony abnormalities. Electronically Signed   By: Lucienne Capers M.D.   On: 04/24/2015 01:23   Dg Femur, Min 2 Views Right  04/25/2015  CLINICAL DATA:  IM nail EXAM: RIGHT FEMUR 2 VIEWS; DG C-ARM 1-60 MIN-NO REPORT COMPARISON:  04/24/2015 FINDINGS: Multiple intraoperative spot images  demonstrate placement of right femoral IM nail across the distal femoral metaphyseal fracture. Near anatomic alignment. Changes from remote right knee replacement. IMPRESSION: Internal fixation across the distal right femoral  fracture. Near anatomic alignment. No visible complicating feature. Electronically Signed   By: Rolm Baptise M.D.   On: 04/25/2015 11:15   Dg Femur, Min 2 Views Right  04/24/2015  CLINICAL DATA:  Fall off a treadmill with right distal femur pain. EXAM: RIGHT FEMUR 2 VIEWS COMPARISON:  None. FINDINGS: Mildly displaced distal femur fracture with apex anterior angulation about the femoral component of total knee arthroplasty. The proximal femur is intact. Hip alignment is maintained. IMPRESSION: Displaced periprosthetic fracture of the distal femur extending to the femoral component of total knee arthroplasty. Proximal femur is intact. Electronically Signed   By: Jeb Levering M.D.   On: 04/24/2015 03:06   Dg Femur Port, Min 2 Views Right  04/25/2015  CLINICAL DATA:  Screw and nail fixation for distal femur fracture EXAM: RIGHT FEMUR PORTABLE 1 VIEW COMPARISON:  April 24, 2015 FINDINGS: Frontal and lateral views were obtained. There is screw and nail fixation through a comminuted fracture of the distal fibular diaphysis and metaphysis with alignment of the fracture fragments in essentially anatomic alignment. There is also a total knee replacement on the left with prosthetic components appearing well-seated. No new fracture. No dislocation. Air in the soft tissues is an expected postoperative finding. IMPRESSION: Alignment essentially anatomic at the distal femoral fracture site with screw and nail fixation. The total knee prosthetic components appear well seated. No new fracture. No dislocation. Electronically Signed   By: Lowella Grip III M.D.   On: 04/25/2015 12:46    Microbiology: Recent Results (from the past 240 hour(s))  MRSA PCR Screening     Status: None   Collection  Time: 04/24/15  5:34 PM  Result Value Ref Range Status   MRSA by PCR NEGATIVE NEGATIVE Final    Comment:        The GeneXpert MRSA Assay (FDA approved for NASAL specimens only), is one component of a comprehensive MRSA colonization surveillance program. It is not intended to diagnose MRSA infection nor to guide or monitor treatment for MRSA infections.      Labs: Basic Metabolic Panel:  Recent Labs Lab 04/24/15 0036 04/26/15 0415 04/27/15 0430 04/28/15 0415 04/29/15 0422  NA 142 139 141 136 136  K 4.2 4.1 4.6 4.5 4.9  CL 108 105 109 106 103  CO2 26 24 24 23  21*  GLUCOSE 160* 158* 162* 173* 144*  BUN 27* 16 22* 30* 29*  CREATININE 1.47* 1.29* 1.72* 1.89* 1.58*  CALCIUM 8.0* 6.8* 6.4* 6.0* 6.1*   Liver Function Tests: No results for input(s): AST, ALT, ALKPHOS, BILITOT, PROT, ALBUMIN in the last 168 hours. No results for input(s): LIPASE, AMYLASE in the last 168 hours. No results for input(s): AMMONIA in the last 168 hours. CBC:  Recent Labs Lab 04/24/15 0036 04/26/15 0415 04/27/15 0430 04/27/15 0836 04/28/15 0415 04/29/15 0422  WBC 13.6* 20.0* 22.2* 21.9* 17.0* 16.8*  NEUTROABS 11.0*  --   --  18.8*  --   --   HGB 9.5* 8.2* 7.8* 7.8* 6.3* 8.6*  HCT 29.4* 24.8* 23.4* 23.9* 18.9* 25.7*  MCV 91.3 91.2 91.1 88.8 87.9 84.0  PLT 291 184 186 199 198 236   Cardiac Enzymes: No results for input(s): CKTOTAL, CKMB, CKMBINDEX, TROPONINI in the last 168 hours. BNP: BNP (last 3 results) No results for input(s): BNP in the last 8760 hours.  ProBNP (last 3 results) No results for input(s): PROBNP in the last 8760 hours.  CBG:  Recent Labs Lab 04/28/15 1211 04/28/15 1812 04/28/15 2201  04/29/15 0728 04/29/15 1203  GLUCAP 145* 161* 161* 147* 163*       SignedDebbe Odea, MD [facility] 04/29/2015, 12:51 PM

## 2015-04-29 NOTE — Progress Notes (Signed)
Nurse called report to Mardene Celeste at [facility]. Mardene Celeste voices understanding and has no further questions at this time.

## 2015-04-29 NOTE — Progress Notes (Addendum)
Pt for d/c to [facility] today. CSW met with pt and pt's dtr who both report agreeable to d/c today. CSW confirmed with Abigail Butts at [facility] they are able to accept pt today. PTAR arranged for transport and RN to call report. CSW signing off at d/c.   Wandra Feinstein, MSW, LCSW

## 2015-04-29 NOTE — Progress Notes (Signed)
   Subjective:  Patient reports pain as mild to moderate.  No c/o.  Objective:   VITALS:   Filed Vitals:   04/28/15 1754 04/28/15 2045 04/28/15 2202 04/29/15 0510  BP: 130/80 123/57 135/54 122/58  Pulse: 86 85 87 84  Temp: 99.5 F (37.5 C) 98.4 F (36.9 C) 99.5 F (37.5 C) 99 F (37.2 C)  TempSrc: Oral Axillary Oral Oral  Resp: 18 18 20 20   Height:      Weight:      SpO2: 99% 96% 99% 95%    ABD soft Sensation intact distally Intact pulses distally Dorsiflexion/Plantar flexion intact Incision: dressing C/D/I Compartment soft   Lab Results  Component Value Date   WBC 16.8* 04/29/2015   HGB 8.6* 04/29/2015   HCT 25.7* 04/29/2015   MCV 84.0 04/29/2015   PLT 236 04/29/2015   BMET    Component Value Date/Time   NA 136 04/29/2015 0422   K 4.9 04/29/2015 0422   CL 103 04/29/2015 0422   CO2 21* 04/29/2015 0422   GLUCOSE 144* 04/29/2015 0422   BUN 29* 04/29/2015 0422   CREATININE 1.58* 04/29/2015 0422   CREATININE 1.39* 09/05/2013 1652   CALCIUM 6.1* 04/29/2015 0422   GFRNONAA 31* 04/29/2015 0422   GFRNONAA 37* 09/05/2013 1652   GFRAA 36* 04/29/2015 0422   GFRAA 43* 09/05/2013 1652     Assessment/Plan: 4 Days Post-Op   Principal Problem:   Fracture, femur, distal, right, closed, initial encounter Active Problems:   DM2 (diabetes mellitus, type 2) ([place])   HTN (hypertension)   Periprosthetic fracture around internal prosthetic right knee joint ([place])   TDWB RLE DVT ppx: aggrenox plus ASA 81 mg PO BID PO pain control PT/OT Fever: had Tmax 101.2 yesterday, WBC trending down, RLE benign, likely atelectasis - pulmonary toilet ABLA: received 2 units PRBCs yesterday with good response SNF placement    Josanna Hefel, Horald Pollen 04/29/2015, 7:13 AM   Rod Can, MD Cell [phone]

## 2015-05-13 ENCOUNTER — Ambulatory Visit: Payer: Medicare Other | Admitting: Podiatry

## 2015-07-24 ENCOUNTER — Encounter (HOSPITAL_COMMUNITY): Payer: Self-pay | Admitting: Emergency Medicine

## 2015-07-24 ENCOUNTER — Emergency Department (HOSPITAL_COMMUNITY)
Admission: EM | Admit: 2015-07-24 | Discharge: 2015-07-24 | Disposition: A | Discharge status: Home / Self Care | Payer: Medicare Other | Attending: Emergency Medicine | Admitting: Emergency Medicine

## 2015-07-24 DIAGNOSIS — Z87891 Personal history of nicotine dependence: Secondary | ICD-10-CM | POA: Diagnosis not present

## 2015-07-24 DIAGNOSIS — I10 Essential (primary) hypertension: Secondary | ICD-10-CM | POA: Diagnosis not present

## 2015-07-24 DIAGNOSIS — Z7982 Long term (current) use of aspirin: Secondary | ICD-10-CM | POA: Insufficient documentation

## 2015-07-24 DIAGNOSIS — Z7984 Long term (current) use of oral hypoglycemic drugs: Secondary | ICD-10-CM | POA: Insufficient documentation

## 2015-07-24 DIAGNOSIS — Z85850 Personal history of malignant neoplasm of thyroid: Secondary | ICD-10-CM | POA: Diagnosis not present

## 2015-07-24 DIAGNOSIS — Z8673 Personal history of transient ischemic attack (TIA), and cerebral infarction without residual deficits: Secondary | ICD-10-CM | POA: Insufficient documentation

## 2015-07-24 DIAGNOSIS — Z96651 Presence of right artificial knee joint: Secondary | ICD-10-CM | POA: Insufficient documentation

## 2015-07-24 DIAGNOSIS — E119 Type 2 diabetes mellitus without complications: Secondary | ICD-10-CM | POA: Diagnosis not present

## 2015-07-24 DIAGNOSIS — R300 Dysuria: Secondary | ICD-10-CM | POA: Diagnosis not present

## 2015-07-24 DIAGNOSIS — J45909 Unspecified asthma, uncomplicated: Secondary | ICD-10-CM | POA: Diagnosis not present

## 2015-07-24 LAB — I-STAT CHEM 8, ED
BUN: 29 mg/dL — AB (ref 6–20)
CREATININE: 1.4 mg/dL — AB (ref 0.44–1.00)
Calcium, Ion: 1.27 mmol/L — ABNORMAL HIGH (ref 1.12–1.23)
Chloride: 106 mmol/L (ref 101–111)
Glucose, Bld: 115 mg/dL — ABNORMAL HIGH (ref 65–99)
HEMATOCRIT: 31 % — AB (ref 36.0–46.0)
HEMOGLOBIN: 10.5 g/dL — AB (ref 12.0–15.0)
POTASSIUM: 4.9 mmol/L (ref 3.5–5.1)
SODIUM: 142 mmol/L (ref 135–145)
TCO2: 25 mmol/L (ref 0–100)

## 2015-07-24 LAB — URINE MICROSCOPIC-ADD ON

## 2015-07-24 LAB — URINALYSIS, ROUTINE W REFLEX MICROSCOPIC
Bilirubin Urine: NEGATIVE
Glucose, UA: NEGATIVE mg/dL
Ketones, ur: NEGATIVE mg/dL
LEUKOCYTES UA: NEGATIVE
Nitrite: NEGATIVE
Protein, ur: NEGATIVE mg/dL
SPECIFIC GRAVITY, URINE: 1.017 (ref 1.005–1.030)
pH: 5 (ref 5.0–8.0)

## 2015-07-24 NOTE — ED Notes (Signed)
To ED via [facility] from home with c/o ongoing uti symptoms- states has burning with urination, foul odor to urine, brown vaginal discharge. Denies any sexual activity, denies vag itching.

## 2015-07-24 NOTE — ED Provider Notes (Signed)
Emergency Department Provider Note  Time seen: Approximately 11:23 AM  I have reviewed the triage vital signs and the nursing notes.   HISTORY  Chief Complaint Recurrent UTI   HPI [name] is a 75 y.o. female with PMH of DM, HLD, HTN, TIA, GERD, and asthma presents to the emergency department for evaluation of one month of continued dysuria. Patient states she's been on multiple rounds of antibiotics primary care physician but symptoms do not improve even transiently. She denies associated fever, shaking chills. She denies any lower abdominal discomfort. Occasionally she'll have some mild right sided pain but reports this is made worse when urinating. She's had no associated vomiting, diarrhea. She does note some vaginal discharge but denies any gross blood. Patient denies being sexually active. She is currently living in an assisted living facility and reports doing well there.   Past Medical History  Diagnosis Date  . Diabetes mellitus   . Hyperlipidemia   . Hypertension   . History of transient ischemic attack (TIA)   . Thyroid carcinoma ([place])   . GERD (gastroesophageal reflux disease)   . OA (osteoarthritis)   . Asthma   . Anemia   . Chest pain, atypical     Patient Active Problem List   Diagnosis Date Noted  . AKI (acute kidney injury) ([place]) 04/29/2015  . Acute blood loss anemia 04/29/2015  . Periprosthetic fracture around internal prosthetic right knee joint ([place]) 04/25/2015  . Fracture, femur, distal, right, closed, initial encounter 04/24/2015  . Acute gout 10/17/2013  . Acute gout of left hand 10/17/2013  . OSTEOARTHRITIS, KNEES, BILATERAL 08/07/2009  . CHEST PAIN, ATYPICAL 02/10/2009  . THYROID CANCER, HX OF 02/10/2009  . BACK PAIN, ACUTE 10/02/2008  . NAUSEA 10/02/2008  . CVA WITH RIGHT HEMIPARESIS 08/15/2008  . HTN (hypertension) 08/15/2008  . CHEST PAIN UNSPECIFIED 12/18/2007  . LEUKOCYTOSIS UNSPECIFIED 08/14/2007  . HYPOTHYROIDISM 07/03/2007  .  DM2 (diabetes mellitus, type 2) ([place]) 07/03/2007  . Spinal epidural abscess 07/03/2007  . Benign essential HTN 07/03/2007  . PSOAS MUSCLE ABSCESS 06/09/2007    Past Surgical History  Procedure Laterality Date  . Cardiac catheterization  03/18/2009    NORMAL LEFT VENTRICULAR SIZE AND CONTRACTILITY WITH NORMAL SYSTOLIC  FUNCTION. EF 60%  . Cardiolite study      SHOWED A SIGNIFICANT REVERSIBLE ANTERIOR WALL DEFECT CONSISTENT WITH ISCHEMIA. EF 55%  . Thyroidectomy    . Back surgery    . Lumbar fusion    . Total knee arthroplasty      right  . Femur im nail Right 04/25/2015    Procedure: INTRAMEDULLARY (IM) RIGHT  RETROGRADE FEMORAL NAILING;  Surgeon: Rod Can, MD;  Location: [facility];  Service: Orthopedics;  Laterality: Right;    Current Outpatient Rx  Name  Route  Sig  Dispense  Refill  . alendronate (FOSAMAX) 70 MG tablet   Oral   Take 70 mg by mouth every 7 (seven) days. Monday. Take with a full glass of water on an empty stomach.         Marland Kitchen amLODipine (NORVASC) 10 MG tablet   Oral   Take 10 mg by mouth daily.         Marland Kitchen atorvastatin (LIPITOR) 20 MG tablet   Oral   Take 20 mg by mouth daily.         . calcitRIOL (ROCALTROL) 0.25 MCG capsule   Oral   Take 0.5 mcg by mouth daily.         Marland Kitchen  calcium-vitamin D (OSCAL WITH D) 500-200 MG-UNIT per tablet   Oral   Take 1 tablet by mouth daily with breakfast.         . dipyridamole-aspirin (AGGRENOX) 25-200 MG per 12 hr capsule   Oral   Take 1 capsule by mouth 2 (two) times daily.          . fluticasone (FLONASE) 50 MCG/ACT nasal spray   Nasal   Place 1 spray into the nose daily as needed for allergies.          . hydrochlorothiazide 25 MG tablet   Oral   Take 25 mg by mouth daily.           Marland Kitchen HYDROcodone-acetaminophen (NORCO/VICODIN) 5-325 MG tablet   Oral   Take 1 tablet by mouth every 6 (six) hours as needed for moderate pain.         Marland Kitchen levothyroxine (SYNTHROID, LEVOTHROID) 137 MCG tablet   Oral    Take 137 mcg by mouth daily before breakfast.         . lisinopril (PRINIVIL,ZESTRIL) 40 MG tablet   Oral   Take 40 mg by mouth daily.         Marland Kitchen LYRICA 150 MG capsule      Take 150 mg by mouth twice daily      5     Dispense as written.   . metFORMIN (GLUCOPHAGE) 1000 MG tablet   Oral   Take 1,000 mg by mouth 2 (two) times daily with a meal.         . valACYclovir (VALTREX) 1000 MG tablet      TAKE 1000 MG BY MOUTH EVERY 12 HOURS AS NEEDED FOR OUTBREAK ON DAY 1         . acetaminophen (TYLENOL) 325 MG tablet   Oral   Take 2 tablets (650 mg total) by mouth every 6 (six) hours as needed for mild pain, fever or headache (or Fever >/= 101). Patient not taking: Reported on 07/24/2015         . aspirin 81 MG chewable tablet   Oral   Chew 1 tablet (81 mg total) by mouth 2 (two) times daily. Patient not taking: Reported on 07/24/2015         . bisacodyl (DULCOLAX) 10 MG suppository   Rectal   Place 1 suppository (10 mg total) rectally daily as needed for moderate constipation. Patient not taking: Reported on 07/24/2015   12 suppository   0   . docusate sodium (COLACE) 100 MG capsule   Oral   Take 1 capsule (100 mg total) by mouth 2 (two) times daily. Patient not taking: Reported on 07/24/2015   10 capsule   0   . levofloxacin (LEVAQUIN) 750 MG tablet   Oral   Take 1 tablet (750 mg total) by mouth every other day. Patient not taking: Reported on 07/24/2015   2 tablet      . methocarbamol (ROBAXIN) 500 MG tablet   Oral   Take 1 tablet (500 mg total) by mouth every 6 (six) hours as needed for muscle spasms. Patient not taking: Reported on 07/24/2015         . oxyCODONE (OXY IR/ROXICODONE) 5 MG immediate release tablet   Oral   Take 1 tablet (5 mg total) by mouth every 8 (eight) hours as needed for severe pain. Patient not taking: Reported on 07/24/2015   30 tablet   0   . senna (SENOKOT) 8.6 MG TABS tablet  Oral   Take 1 tablet (8.6 mg total) by mouth  2 (two) times daily. Patient not taking: Reported on 07/24/2015   120 each   0     Allergies Actos; Ativan; Cephalexin; Oxycodone; Rosiglitazone; and Tramadol  Family History  Problem Relation Age of Onset  . Pneumonia Mother   . Heart attack Father     Social History Social History  Substance Use Topics  . Smoking status: Former Research scientist (life sciences)  . Smokeless tobacco: Never Used  . Alcohol Use: No    Review of Systems  Constitutional: No fever/chills Eyes: No visual changes. ENT: No sore throat. Cardiovascular: Denies chest pain. Respiratory: Denies shortness of breath. Gastrointestinal: No abdominal pain.  No nausea, no vomiting.  No diarrhea.  No constipation. Genitourinary: Positive for dysuria and vaginal discharge.  Musculoskeletal: Negative for back pain. Skin: Negative for rash. Neurological: Negative for headaches, focal weakness or numbness.  10-point ROS otherwise negative.  ____________________________________________   PHYSICAL EXAM:  VITAL SIGNS: ED Triage Vitals  Enc Vitals Group     BP 07/24/15 1058 122/52 mmHg     Pulse Rate 07/24/15 1058 82     Resp 07/24/15 1058 18     Temp 07/24/15 1058 97.7 F (36.5 C)     Temp Source 07/24/15 1058 Oral     SpO2 07/24/15 1058 100 %     Weight 07/24/15 1058 244 lb (110.678 kg)     Height 07/24/15 1058 5\' 4"  (1.626 m)     Pain Score 07/24/15 1059 0   Constitutional: Alert and oriented. Well appearing and in no acute distress. Eyes: Conjunctivae are normal. PERRL. EOMI. Head: Atraumatic. Nose: No congestion/rhinnorhea. Mouth/Throat: Mucous membranes are moist.  Oropharynx non-erythematous. Neck: No stridor.  Cardiovascular: Normal rate, regular rhythm. Good peripheral circulation. Grossly normal heart sounds.   Respiratory: Normal respiratory effort.  No retractions. Lungs CTAB. Gastrointestinal: Soft and nontender. No distention.  Genitourinary: Normal external genitalia. No obvious urethral irritation,  ulceration, or discharge. No gross blood. Normal introitus.  Musculoskeletal: No lower extremity tenderness nor edema. No gross deformities of extremities. Neurologic:  Normal speech and language. No gross focal neurologic deficits are appreciated.  Skin:  Skin is warm, dry and intact. No rash noted. Psychiatric: Mood and affect are normal. Speech and behavior are normal.  ____________________________________________   LABS (all labs ordered are listed, but only abnormal results are displayed)  Labs Reviewed  URINALYSIS, ROUTINE W REFLEX MICROSCOPIC (NOT AT [facility]) - Abnormal; Notable for the following:    Hgb urine dipstick TRACE (*)    All other components within normal limits  URINE MICROSCOPIC-ADD ON - Abnormal; Notable for the following:    Squamous Epithelial / LPF 6-30 (*)    Bacteria, UA RARE (*)    All other components within normal limits  I-STAT CHEM 8, ED - Abnormal; Notable for the following:    BUN 29 (*)    Creatinine, Ser 1.40 (*)    Glucose, Bld 115 (*)    Calcium, Ion 1.27 (*)    Hemoglobin 10.5 (*)    HCT 31.0 (*)    All other components within normal limits   ____________________________________________   PROCEDURES  Procedure(s) performed:   Procedures  None ____________________________________________   INITIAL IMPRESSION / ASSESSMENT AND PLAN / ED COURSE  Pertinent labs & imaging results that were available during my care of the patient were reviewed by me and considered in my medical decision making (see chart for details).  Patient resents to  the emergency department for evaluation of dysuria for the last month in the setting of urinary tract infection symptoms. No fevers, altered mental status, or abdominal pain. The patient has been compliant with antibiotics and recently finished her last course. He didn't use to have dysuria. She does have mild flank discomfort but no other symptoms to suggest pyelonephritis. My exam she is awake and alert. I  cannot find any clear reason why she would have significant dysuria. Plan for repeat labs and urinalysis.  On repeat evaluation the patient does not have a urinary tract infection. Creatinine is at baseline. Plan for discharge with conservative management and PCP follow up. Discussed my impression with the patient and answered questions.    ____________________________________________  FINAL CLINICAL IMPRESSION(S) / ED DIAGNOSES  Final diagnoses:  Dysuria     MEDICATIONS GIVEN DURING THIS VISIT:  None  NEW OUTPATIENT MEDICATIONS STARTED DURING THIS VISIT:  None   Note:  This document was prepared using Dragon voice recognition software and may include unintentional dictation errors.  Nanda Quinton, MD Emergency Medicine  Margette Fast, MD 07/24/15 [phone]

## 2015-07-24 NOTE — Discharge Instructions (Signed)
You were seen in the ED today with pain when urinating. We did not find evidence of a urine infection. Your other labs were also normal. We are unsure why you continue to have pain and would like for you to return to the PCP office for discussion.   Return to the ED with any fever, shaking chills, or sudden worsening abdominal pain.  Dysuria Dysuria is pain or discomfort while urinating. The pain or discomfort may be felt in the tube that carries urine out of the bladder (urethra) or in the surrounding tissue of the genitals. The pain may also be felt in the groin area, lower abdomen, and lower back. You may have to urinate frequently or have the sudden feeling that you have to urinate (urgency). Dysuria can affect both men and women, but is more common in women. Dysuria can be caused by many different things, including:  Urinary tract infection in women.  Infection of the kidney or bladder.  Kidney stones or bladder stones.  Certain sexually transmitted infections (STIs), such as chlamydia.  Dehydration.  Inflammation of the vagina.  Use of certain medicines.  Use of certain soaps or scented products that cause irritation. HOME CARE INSTRUCTIONS Watch your dysuria for any changes. The following actions may help to reduce any discomfort you are feeling:  Drink enough fluid to keep your urine clear or pale yellow.  Empty your bladder often. Avoid holding urine for Nyle Limb periods of time.  After a bowel movement or urination, women should cleanse from front to back, using each tissue only once.  Empty your bladder after sexual intercourse.  Take medicines only as directed by your health care provider.  If you were prescribed an antibiotic medicine, finish it all even if you start to feel better.  Avoid caffeine, tea, and alcohol. They can irritate the bladder and make dysuria worse. In men, alcohol may irritate the prostate.  Keep all follow-up visits as directed by your health  care provider. This is important.  If you had any tests done to find the cause of dysuria, it is your responsibility to obtain your test results. Ask the lab or department performing the test when and how you will get your results. Talk with your health care provider if you have any questions about your results. SEEK MEDICAL CARE IF:  You develop pain in your back or sides.  You have a fever.  You have nausea or vomiting.  You have blood in your urine.  You are not urinating as often as you usually do. SEEK IMMEDIATE MEDICAL CARE IF:  You pain is severe and not relieved with medicines.  You are unable to hold down any fluids.  You or someone else notices a change in your mental function.  You have a rapid heartbeat at rest.  You have shaking or chills.  You feel extremely weak.   This information is not intended to replace advice given to you by your health care provider. Make sure you discuss any questions you have with your health care provider.   Document Released: 09/19/2003 Document Revised: 01/11/2014 Document Reviewed: 08/16/2013 Elsevier Interactive Patient Education [employer].

## 2015-07-24 NOTE — ED Notes (Signed)
Pt requests to have [facility] transport back to [facility].

## 2015-10-24 DIAGNOSIS — F1120 Opioid dependence, uncomplicated: Secondary | ICD-10-CM | POA: Insufficient documentation

## 2016-01-07 ENCOUNTER — Ambulatory Visit (INDEPENDENT_AMBULATORY_CARE_PROVIDER_SITE_OTHER): Payer: Medicare Other | Admitting: Podiatry

## 2016-01-07 ENCOUNTER — Encounter: Payer: Self-pay | Admitting: Podiatry

## 2016-01-07 VITALS — BP 148/66 | HR 69 | Resp 18

## 2016-01-07 DIAGNOSIS — B351 Tinea unguium: Secondary | ICD-10-CM

## 2016-01-07 DIAGNOSIS — M79676 Pain in unspecified toe(s): Secondary | ICD-10-CM

## 2016-01-07 NOTE — Progress Notes (Signed)
Patient ID: [name], female   DOB: [birth year], 77 y.o.   MRN: [id number]   Subjective: Patient presents today complaining that her toenails are related and left feet are elongated and thickened and request toenail debridement. She also requested general diabetic foot exam. History of right femoral fracture Diabetic and denies history of foot ulceration, claudication or amputation  Objective: Orientated 3 Bilateral peripheral pitting edema DP ulcers 2/4 bilaterally PT pulses 1/4 bilaterally Capillary reflex within normal limits bilaterally Sensation to 10 g monofilament wire intact 5/5 bilaterally Vibratory sensation nonreactive bilaterally Ankle reflex reactive bilaterally No open skin lesions bilaterally Absent hallux toenails bilaterally Surgical scar fifth toe left Toenails 2-5 are elongated, incurvated, deformed and tender with direct palpation Patient walks slowly with a walker Manual motor testing dorsi flexion, plantar flexion 5/5 bilaterally  Assessment: Diabetic with mild decrease in peripheral pulses without history of open lesions Diabetic with mild peripheral neuropathy Mycotic toenails with symptoms  Plan: Debridement of toenails 6-10 mechanical he an electrical without anybleeding  Reappoint when necessary or yearly

## 2016-01-07 NOTE — Progress Notes (Signed)
   Subjective:    Patient ID: [name], female    DOB: 10-21-1940, 76 y.o.   MRN: [id number]  HPI   I need my toenails trimmed up and I fell off of a treadmill last April 2017 and broke my right fermur bone and was in therapy until about the middle of September of last year and my toes are hurting under both feet   Review of Systems  All other systems reviewed and are negative.      Objective:   Physical Exam        Assessment & Plan:

## 2016-01-07 NOTE — Progress Notes (Signed)
Patient ID: [name], female   DOB: [birth year], 76 y.o.   MRN: [id number]

## 2016-01-07 NOTE — Patient Instructions (Signed)

## 2016-01-29 ENCOUNTER — Other Ambulatory Visit: Payer: Self-pay | Admitting: Family Medicine

## 2016-01-29 DIAGNOSIS — Z1231 Encounter for screening mammogram for malignant neoplasm of breast: Secondary | ICD-10-CM

## 2016-01-29 DIAGNOSIS — E2839 Other primary ovarian failure: Secondary | ICD-10-CM

## 2016-02-11 ENCOUNTER — Ambulatory Visit
Admission: RE | Admit: 2016-02-11 | Discharge: 2016-02-11 | Disposition: A | Discharge status: Home / Self Care | Payer: Medicare Other | Source: Ambulatory Visit | Attending: Family Medicine | Admitting: Family Medicine

## 2016-02-11 DIAGNOSIS — Z1231 Encounter for screening mammogram for malignant neoplasm of breast: Secondary | ICD-10-CM

## 2016-02-11 DIAGNOSIS — E2839 Other primary ovarian failure: Secondary | ICD-10-CM

## 2016-05-11 ENCOUNTER — Ambulatory Visit (INDEPENDENT_AMBULATORY_CARE_PROVIDER_SITE_OTHER): Payer: Medicare Other | Admitting: Podiatry

## 2016-05-11 DIAGNOSIS — M79676 Pain in unspecified toe(s): Secondary | ICD-10-CM | POA: Diagnosis not present

## 2016-05-11 DIAGNOSIS — E1142 Type 2 diabetes mellitus with diabetic polyneuropathy: Secondary | ICD-10-CM

## 2016-05-11 DIAGNOSIS — B351 Tinea unguium: Secondary | ICD-10-CM | POA: Diagnosis not present

## 2016-05-11 DIAGNOSIS — R609 Edema, unspecified: Secondary | ICD-10-CM

## 2016-05-11 DIAGNOSIS — M2010 Hallux valgus (acquired), unspecified foot: Secondary | ICD-10-CM

## 2016-05-11 NOTE — Progress Notes (Signed)
Complaint:  Visit Type: Patient returns to my office for continued preventative foot care services. Complaint: Patient states" my nails have grown long and thick and become painful to walk and wear shoes" Patient has been diagnosed with DM with neuropathy.. The patient presents for preventative foot care services. She also requests diabetic shoes.  Podiatric Exam: Vascular: dorsalis pedis and posterior tibial pulses are palpable bilateral. Capillary return is immediate. Temperature gradient is WNL. Skin turgor WNL  Sensorium: Diminished  Semmes Weinstein monofilament test. Normal tactile sensation bilaterally. Nail Exam: Pt has thick disfigured discolored nails with subungual debris noted bilateral entire nail hallux through fifth toenails Ulcer Exam: There is no evidence of ulcer or pre-ulcerative changes or infection. Orthopedic Exam: Muscle tone and strength are WNL. No limitations in general ROM. No crepitus or effusions noted. Dorsal exostosis  B/l.  HAV  B/L with hammer toes second  B/l Skin: No Porokeratosis. No infection or ulcers  Diagnosis:  Onychomycosis, , Pain in right toe, pain in left toes,  DPN  HAV  Hammer toes  Treatment & Plan Procedures and Treatment: Consent by patient was obtained for treatment procedures. The patient understood the discussion of treatment and procedures well. All questions were answered thoroughly reviewed. Debridement of mycotic and hypertrophic toenails, 1 through 5 bilateral and clearing of subungual debris. No ulceration, no infection noted.  Initiate diabetic paperwork for diabetic shoes for DPN and HAV and HT. Return Visit-Office Procedure: Patient instructed to return to the office for a follow up visit 3 months for continued evaluation and treatment. To schedule appointment for shoe measurement.    Gardiner Barefoot DPM

## 2016-07-09 ENCOUNTER — Encounter (HOSPITAL_COMMUNITY): Payer: Self-pay | Admitting: Family Medicine

## 2016-07-09 ENCOUNTER — Ambulatory Visit (HOSPITAL_COMMUNITY)
Admission: EM | Admit: 2016-07-09 | Discharge: 2016-07-09 | Disposition: A | Discharge status: Home / Self Care | Payer: Medicare Other | Attending: Internal Medicine | Admitting: Internal Medicine

## 2016-07-09 ENCOUNTER — Ambulatory Visit (INDEPENDENT_AMBULATORY_CARE_PROVIDER_SITE_OTHER): Payer: Medicare Other

## 2016-07-09 DIAGNOSIS — S300XXA Contusion of lower back and pelvis, initial encounter: Secondary | ICD-10-CM

## 2016-07-09 DIAGNOSIS — W19XXXA Unspecified fall, initial encounter: Secondary | ICD-10-CM | POA: Diagnosis not present

## 2016-07-09 MED ORDER — KETOROLAC TROMETHAMINE 30 MG/ML IJ SOLN
30.0000 mg | Freq: Once | INTRAMUSCULAR | Status: AC
  Administered 2016-07-09: 30 mg via INTRAMUSCULAR

## 2016-07-09 MED ORDER — KETOROLAC TROMETHAMINE 30 MG/ML IJ SOLN
INTRAMUSCULAR | Status: AC
  Filled 2016-07-09: qty 1

## 2016-07-09 NOTE — ED Provider Notes (Signed)
CSN: [id number]     Arrival date & time 07/09/16  1014 History   First MD Initiated Contact with Patient 07/09/16 1108     Chief Complaint  Patient presents with  . Tailbone Pain   (Consider location/radiation/quality/duration/timing/severity/associated sxs/prior Treatment) Patient c/o tail bone pain after falling onto concrete on her bottom.  She is having moderate pain and is having difficulty walking due to pain.   The history is provided by the patient.  Back Pain  Pain location: coccyx. Quality:  Aching Radiates to:  Does not radiate Pain severity:  Moderate Pain is:  Same all the time Onset quality:  Sudden Duration:  2 hours Timing:  Constant Progression:  Unchanged Chronicity:  New Relieved by:  Nothing Worsened by:  Nothing Ineffective treatments:  None tried   Past Medical History:  Diagnosis Date  . Anemia   . Asthma   . Chest pain, atypical   . Diabetes mellitus   . GERD (gastroesophageal reflux disease)   . History of transient ischemic attack (TIA)   . Hyperlipidemia   . Hypertension   . OA (osteoarthritis)   . Thyroid carcinoma [facility])    Past Surgical History:  Procedure Laterality Date  . BACK SURGERY    . BREAST BIOPSY    . CARDIAC CATHETERIZATION  03/18/2009   NORMAL LEFT VENTRICULAR SIZE AND CONTRACTILITY WITH NORMAL SYSTOLIC  FUNCTION. EF 60%  . CARDIOLITE STUDY     SHOWED A SIGNIFICANT REVERSIBLE ANTERIOR WALL DEFECT CONSISTENT WITH ISCHEMIA. EF 55%  . FEMUR IM NAIL Right 04/25/2015   Procedure: INTRAMEDULLARY (IM) RIGHT  RETROGRADE FEMORAL NAILING;  Surgeon: Rod Can, MD;  Location: [facility];  Service: Orthopedics;  Laterality: Right;  . LUMBAR FUSION    . THYROIDECTOMY    . TOTAL KNEE ARTHROPLASTY     right   Family History  Problem Relation Age of Onset  . Pneumonia Mother   . Heart attack Father    Social History  Substance Use Topics  . Smoking status: Former Research scientist (life sciences)  . Smokeless tobacco: Never Used  . Alcohol use No   OB  History    No data available     Review of Systems  Constitutional: Negative.   HENT: Negative.   Eyes: Negative.   Respiratory: Negative.   Cardiovascular: Negative.   Gastrointestinal: Negative.   Endocrine: Negative.   Genitourinary: Negative.   Musculoskeletal: Positive for back pain.  Allergic/Immunologic: Negative.   Neurological: Negative.   Psychiatric/Behavioral: Negative.     Allergies  Actos [pioglitazone hydrochloride]; Ativan [lorazepam]; Cephalexin; Oxycodone; Rosiglitazone; and Tramadol  Home Medications   Prior to Admission medications   Medication Sig Start Date End Date Taking? Authorizing Provider  acetaminophen (TYLENOL) 325 MG tablet Take 2 tablets (650 mg total) by mouth every 6 (six) hours as needed for mild pain, fever or headache (or Fever >/= 101). Patient not taking: Reported on 07/24/2015 04/29/15   Debbe Odea, MD  alendronate (FOSAMAX) 70 MG tablet Take 70 mg by mouth every 7 (seven) days. Monday. Take with a full glass of water on an empty stomach.    [provider]  amLODipine (NORVASC) 10 MG tablet Take 10 mg by mouth daily.    [provider]  aspirin 81 MG chewable tablet Chew 1 tablet (81 mg total) by mouth 2 (two) times daily. Patient not taking: Reported on 07/24/2015 04/29/15   Debbe Odea, MD  atorvastatin (LIPITOR) 20 MG tablet Take 20 mg by mouth daily.  [provider]  bisacodyl (DULCOLAX) 10 MG suppository Place 1 suppository (10 mg total) rectally daily as needed for moderate constipation. Patient not taking: Reported on 07/24/2015 04/29/15   Debbe Odea, MD  calcitRIOL (ROCALTROL) 0.25 MCG capsule Take 0.5 mcg by mouth daily.    [provider]  calcium-vitamin D (OSCAL WITH D) 500-200 MG-UNIT per tablet Take 1 tablet by mouth daily with breakfast.    [provider]  dipyridamole-aspirin (AGGRENOX) 25-200 MG per 12 hr capsule Take 1 capsule by mouth 2 (two) times daily.     [provider]  docusate sodium (COLACE) 100 MG capsule Take 1 capsule (100 mg total) by mouth 2 (two) times daily. Patient not taking: Reported on 07/24/2015 04/29/15   Debbe Odea, MD  fluticasone [facility]) 50 MCG/ACT nasal spray Place 1 spray into the nose daily as needed for allergies.  12/20/12   [provider]  hydrochlorothiazide 25 MG tablet Take 25 mg by mouth daily.      [provider]  HYDROcodone-acetaminophen (NORCO/VICODIN) 5-325 MG tablet Take 1 tablet by mouth every 6 (six) hours as needed for moderate pain.    [provider]  levofloxacin (LEVAQUIN) 750 MG tablet Take 1 tablet (750 mg total) by mouth every other day. Patient not taking: Reported on 07/24/2015 04/30/15   Debbe Odea, MD  levothyroxine (SYNTHROID, LEVOTHROID) 137 MCG tablet Take 137 mcg by mouth daily before breakfast.    [provider]  lisinopril (PRINIVIL,ZESTRIL) 40 MG tablet Take 40 mg by mouth daily.    [provider]  LYRICA 150 MG capsule Take 150 mg by mouth twice daily 09/26/14   [provider]  metFORMIN (GLUCOPHAGE) 1000 MG tablet Take 1,000 mg by mouth 2 (two) times daily with a meal.    [provider]  methocarbamol (ROBAXIN) 500 MG tablet Take 1 tablet (500 mg total) by mouth every 6 (six) hours as needed for muscle spasms. Patient not taking: Reported on 07/24/2015 04/29/15   Debbe Odea, MD  oxyCODONE (OXY IR/ROXICODONE) 5 MG immediate release tablet Take 1 tablet (5 mg total) by mouth every 8 (eight) hours as needed for severe pain. Patient not taking: Reported on 07/24/2015 04/29/15   Debbe Odea, MD  senna (SENOKOT) 8.6 MG TABS tablet Take 1 tablet (8.6 mg total) by mouth 2 (two) times daily. Patient not taking: Reported on 07/24/2015 04/29/15   Debbe Odea, MD  valACYclovir (VALTREX) 1000 MG tablet TAKE 1000 MG BY MOUTH EVERY 12 HOURS AS NEEDED FOR OUTBREAK ON DAY 1 07/05/14   [provider]   Meds Ordered and  Administered this Visit   Medications  ketorolac (TORADOL) 30 MG/ML injection 30 mg (30 mg Intramuscular Given 07/09/16 1218)    BP (!) 158/63   Pulse 72   Temp 98.5 F (36.9 C)   Resp 18   SpO2 100%  No data found.   Physical Exam  Constitutional: She appears well-developed and well-nourished.  HENT:  Head: Normocephalic and atraumatic.  Eyes: Conjunctivae and EOM are normal. Pupils are equal, round, and reactive to light.  Neck: Normal range of motion. Neck supple.  Cardiovascular: Normal rate, regular rhythm and normal heart sounds.   Pulmonary/Chest: Effort normal and breath sounds normal.  Musculoskeletal: She exhibits tenderness.  TTP coccyx  Nursing note and vitals reviewed.   Urgent Care Course     Procedures (including critical care time)  Labs Review Labs Reviewed - No data to display  Imaging Review  Dg Sacrum/coccyx  Result Date: 07/09/2016 CLINICAL DATA:  Fall. EXAM: SACRUM AND COCCYX - 2+ VIEW COMPARISON:  MRI 09/21/2013 . FINDINGS: Lower lumbar spine fusion. Diffuse osteopenia degenerative change. No acute abnormality identified. No evidence of fracture. Pelvic sutures are noted . IMPRESSION: 1.  No acute abnormality identified. 2. Prior lower lumbar spine fusion. Degenerative changes lumbar spine and both hips . Electronically Signed   By: Marcello Moores  Register   On: 07/09/2016 11:57     Visual Acuity Review  Right Eye Distance:   Left Eye Distance:   Bilateral Distance:    Right Eye Near:   Left Eye Near:    Bilateral Near:         MDM   1. Contusion of coccyx, initial encounter    Toradol 30mg  IM  Take hydrocodone from home for pain And also take tylenol otc as directed for pain      Lysbeth Penner, FNP 07/09/16 1249

## 2016-07-09 NOTE — Discharge Instructions (Signed)
Take hydrocodone from home and tylenol otc as directed for pain.

## 2016-07-09 NOTE — ED Triage Notes (Signed)
Pt here for sacral pain after falling on her tailbone Tuesday. sts that she slipped getting into the car and landed on pavement.

## 2016-08-11 ENCOUNTER — Ambulatory Visit (INDEPENDENT_AMBULATORY_CARE_PROVIDER_SITE_OTHER): Payer: Medicare Other | Admitting: Podiatry

## 2016-08-11 ENCOUNTER — Ambulatory Visit: Payer: Medicare Other | Admitting: Podiatry

## 2016-08-11 ENCOUNTER — Encounter: Payer: Self-pay | Admitting: Podiatry

## 2016-08-11 VITALS — BP 133/62 | HR 63 | Temp 97.4°F | Resp 18

## 2016-08-11 DIAGNOSIS — L089 Local infection of the skin and subcutaneous tissue, unspecified: Secondary | ICD-10-CM

## 2016-08-11 DIAGNOSIS — E1142 Type 2 diabetes mellitus with diabetic polyneuropathy: Secondary | ICD-10-CM

## 2016-08-11 DIAGNOSIS — S90425A Blister (nonthermal), left lesser toe(s), initial encounter: Secondary | ICD-10-CM | POA: Diagnosis not present

## 2016-08-11 DIAGNOSIS — B351 Tinea unguium: Secondary | ICD-10-CM

## 2016-08-11 MED ORDER — CEPHALEXIN 500 MG PO CAPS: 500.0000 mg | ORAL_CAPSULE | Freq: Two times a day (BID) | ORAL | 1 refills | Status: DC

## 2016-08-11 MED ORDER — CEPHALEXIN 500 MG PO CAPS
500.0000 mg | ORAL_CAPSULE | Freq: Four times a day (QID) | ORAL | 1 refills | Status: DC
Start: 2016-08-11 — End: 2017-04-09

## 2016-08-11 NOTE — Progress Notes (Signed)
   Subjective:    Patient ID: [name], female    DOB: 1940/01/05, 76 y.o.   MRN: [id number]  HPI This patient presents today for scheduled visit complaining of uncomfortable toenails walking wearing shoes and requests toenail debridement. Patient presents at approximately three-month intervals for this service Today, patient is also complaining of discomfort in the fifth and fourth left toes when walking wearing shoes for approximately a week. Patient states and when she has less pressure and enclosed shoe the symptoms seem to reduce, however still persist. She would like to have this evaluated as well  Patient is diabetic and denies history of foot ulceration, amputation or claudication   Review of Systems  Skin:       I have a spot on my left foot on the arch area  All other systems reviewed and are negative.      Objective:   Physical Exam  DP of 133/62 Pulse 63 Respiration 18 Temperature 97.4 Fahrenheit  Orientated 3  Vascular: Peripheral pitting edema bilaterally No calf tenderness or calf pain bilaterally DP pulses 2/4 bilaterally PT pulses 1/4 bilaterally Capillary reflex within normal limits bilaterally  Neurological: Sensation to 10 g monofilament wire intact 8/9 bilaterally Vibratory sensation nonreactive bilaterally Ankle reflex reactive bilaterally  Dermatological: Fourth left webspace has macerated blister reaction with low-grade edema fifth left toe. Debridement of blister releases no obvious drainage. Underlying fourth left webspace is inflamed and moist. There is no malodor or active drainage The toenails are elongated, brittle, deformed, hypertrophic 6-10 Well-healed surgical scar fifth toe left foot  Musculoskeletal: Dorsi flexion, plantar flexion, inversion, eversion 5/5 bilaterally Hammertoe second left      Assessment & Plan:   Assessment: Diabetic peripheral neuropathy Infected blister fourth left webspace Symptomatic mycotic  toenails 6-10  Plan: Debride blister and apply topical antibiotic ointment to fourth left webspace Patient instructed to apply triple antibiotic ointment and gauze pad to the webspace daily Instructed to drive wet thoroughly after soaking in soft soap daily Rx cephalexin 500 mg by mouth twice a day 7 days Patient notices any sudden increase in pain, swelling, redness, fever present to the emergency department  Debridement of toenails 6-10 mechanically an electrical without any bleeding  Reappoint 3 months for nail debridement or sooner if patient has concern

## 2016-08-11 NOTE — Patient Instructions (Signed)
Apply triple antibiotic ointment to the blister on the fifth left toe daily, cover with a small piece of gauze and attach to skin with paper tape. Okay to rinse toes with a soft soap , placing a tablespoon in a quart a tap water and soaking toe 1-2 minutes daily Dry skin well and then apply antibiotic ointment and gauze  Begin taking antibiotics by mouth 7 days If you notice any sudden increase in pain, swelling, redness, fever presents emergency department  Diabetes and Foot Care Diabetes may cause you to have problems because of poor blood supply (circulation) to your feet and legs. This may cause the skin on your feet to become thinner, break easier, and heal more slowly. Your skin may become dry, and the skin may peel and crack. You may also have nerve damage in your legs and feet causing decreased feeling in them. You may not notice minor injuries to your feet that could lead to infections or more serious problems. Taking care of your feet is one of the most important things you can do for yourself. Follow these instructions at home:  Wear shoes at all times, even in the house. Do not go barefoot. Bare feet are easily injured.  Check your feet daily for blisters, cuts, and redness. If you cannot see the bottom of your feet, use a mirror or ask someone for help.  Wash your feet with warm water (do not use hot water) and mild soap. Then pat your feet and the areas between your toes until they are completely dry. Do not soak your feet as this can dry your skin.  Apply a moisturizing lotion or petroleum jelly (that does not contain alcohol and is unscented) to the skin on your feet and to dry, brittle toenails. Do not apply lotion between your toes.  Trim your toenails straight across. Do not dig under them or around the cuticle. File the edges of your nails with an emery board or nail file.  Do not cut corns or calluses or try to remove them with medicine.  Wear clean socks or stockings  every day. Make sure they are not too tight. Do not wear knee-high stockings since they may decrease blood flow to your legs.  Wear shoes that fit properly and have enough cushioning. To break in new shoes, wear them for just a few hours a day. This prevents you from injuring your feet. Always look in your shoes before you put them on to be sure there are no objects inside.  Do not cross your legs. This may decrease the blood flow to your feet.  If you find a minor scrape, cut, or break in the skin on your feet, keep it and the skin around it clean and dry. These areas may be cleansed with mild soap and water. Do not cleanse the area with peroxide, alcohol, or iodine.  When you remove an adhesive bandage, be sure not to damage the skin around it.  If you have a wound, look at it several times a day to make sure it is healing.  Do not use heating pads or hot water bottles. They may burn your skin. If you have lost feeling in your feet or legs, you may not know it is happening until it is too late.  Make sure your health care provider performs a complete foot exam at least annually or more often if you have foot problems. Report any cuts, sores, or bruises to your health care provider  immediately. Contact a health care provider if:  You have an injury that is not healing.  You have cuts or breaks in the skin.  You have an ingrown nail.  You notice redness on your legs or feet.  You feel burning or tingling in your legs or feet.  You have pain or cramps in your legs and feet.  Your legs or feet are numb.  Your feet always feel cold. Get help right away if:  There is increasing redness, swelling, or pain in or around a wound.  There is a red line that goes up your leg.  Pus is coming from a wound.  You develop a fever or as directed by your health care provider.  You notice a bad smell coming from an ulcer or wound. This information is not intended to replace advice given to  you by your health care provider. Make sure you discuss any questions you have with your health care provider. Document Released: 12/19/1999 Document Revised: 05/29/2015 Document Reviewed: 05/30/2012 Elsevier Interactive Patient Education  2017 [employer].

## 2016-08-23 ENCOUNTER — Ambulatory Visit: Payer: Medicare Other | Admitting: Orthotics

## 2016-09-08 ENCOUNTER — Ambulatory Visit: Payer: Medicare Other

## 2016-11-16 ENCOUNTER — Encounter (INDEPENDENT_AMBULATORY_CARE_PROVIDER_SITE_OTHER): Payer: Medicare Other | Admitting: Podiatry

## 2016-11-16 NOTE — Progress Notes (Signed)
This encounter was created in error - please disregard.

## 2016-12-31 DIAGNOSIS — M51369 Other intervertebral disc degeneration, lumbar region without mention of lumbar back pain or lower extremity pain: Secondary | ICD-10-CM | POA: Insufficient documentation

## 2016-12-31 DIAGNOSIS — M5136 Other intervertebral disc degeneration, lumbar region: Secondary | ICD-10-CM | POA: Insufficient documentation

## 2017-02-08 ENCOUNTER — Emergency Department (HOSPITAL_COMMUNITY)
Admission: EM | Admit: 2017-02-08 | Discharge: 2017-02-09 | Disposition: A | Discharge status: Home / Self Care | Payer: Medicare Other | Attending: Emergency Medicine | Admitting: Emergency Medicine

## 2017-02-08 ENCOUNTER — Encounter (HOSPITAL_COMMUNITY): Payer: Self-pay

## 2017-02-08 ENCOUNTER — Emergency Department (HOSPITAL_COMMUNITY): Payer: Medicare Other

## 2017-02-08 ENCOUNTER — Other Ambulatory Visit: Payer: Self-pay

## 2017-02-08 DIAGNOSIS — R110 Nausea: Secondary | ICD-10-CM | POA: Diagnosis not present

## 2017-02-08 DIAGNOSIS — I10 Essential (primary) hypertension: Secondary | ICD-10-CM | POA: Insufficient documentation

## 2017-02-08 DIAGNOSIS — Z794 Long term (current) use of insulin: Secondary | ICD-10-CM | POA: Diagnosis not present

## 2017-02-08 DIAGNOSIS — Z96651 Presence of right artificial knee joint: Secondary | ICD-10-CM | POA: Insufficient documentation

## 2017-02-08 DIAGNOSIS — Z87891 Personal history of nicotine dependence: Secondary | ICD-10-CM | POA: Diagnosis not present

## 2017-02-08 DIAGNOSIS — C73 Malignant neoplasm of thyroid gland: Secondary | ICD-10-CM | POA: Diagnosis not present

## 2017-02-08 DIAGNOSIS — E119 Type 2 diabetes mellitus without complications: Secondary | ICD-10-CM | POA: Insufficient documentation

## 2017-02-08 DIAGNOSIS — Z79899 Other long term (current) drug therapy: Secondary | ICD-10-CM | POA: Diagnosis not present

## 2017-02-08 DIAGNOSIS — Z8673 Personal history of transient ischemic attack (TIA), and cerebral infarction without residual deficits: Secondary | ICD-10-CM | POA: Diagnosis not present

## 2017-02-08 DIAGNOSIS — Z7982 Long term (current) use of aspirin: Secondary | ICD-10-CM | POA: Diagnosis not present

## 2017-02-08 DIAGNOSIS — E039 Hypothyroidism, unspecified: Secondary | ICD-10-CM | POA: Diagnosis not present

## 2017-02-08 DIAGNOSIS — R42 Dizziness and giddiness: Secondary | ICD-10-CM

## 2017-02-08 LAB — CBG MONITORING, ED
GLUCOSE-CAPILLARY: 152 mg/dL — AB (ref 65–99)
Glucose-Capillary: 163 mg/dL — ABNORMAL HIGH (ref 65–99)

## 2017-02-08 LAB — DIFFERENTIAL
BASOS ABS: 0 10*3/uL (ref 0.0–0.1)
Basophils Relative: 0 %
Eosinophils Absolute: 0 10*3/uL (ref 0.0–0.7)
Eosinophils Relative: 0 %
LYMPHS ABS: 1.4 10*3/uL (ref 0.7–4.0)
LYMPHS PCT: 12 %
MONO ABS: 0.6 10*3/uL (ref 0.1–1.0)
MONOS PCT: 5 %
NEUTROS ABS: 9.7 10*3/uL — AB (ref 1.7–7.7)
Neutrophils Relative %: 83 %

## 2017-02-08 LAB — I-STAT CHEM 8, ED
BUN: 21 mg/dL — AB (ref 6–20)
CREATININE: 1.8 mg/dL — AB (ref 0.44–1.00)
Calcium, Ion: 1.11 mmol/L — ABNORMAL LOW (ref 1.15–1.40)
Chloride: 102 mmol/L (ref 101–111)
GLUCOSE: 154 mg/dL — AB (ref 65–99)
HEMATOCRIT: 31 % — AB (ref 36.0–46.0)
HEMOGLOBIN: 10.5 g/dL — AB (ref 12.0–15.0)
Potassium: 4.3 mmol/L (ref 3.5–5.1)
Sodium: 139 mmol/L (ref 135–145)
TCO2: 25 mmol/L (ref 22–32)

## 2017-02-08 LAB — COMPREHENSIVE METABOLIC PANEL
ALK PHOS: 52 U/L (ref 38–126)
ALT: 15 U/L (ref 14–54)
AST: 21 U/L (ref 15–41)
Albumin: 4 g/dL (ref 3.5–5.0)
Anion gap: 14 (ref 5–15)
BILIRUBIN TOTAL: 0.7 mg/dL (ref 0.3–1.2)
BUN: 21 mg/dL — AB (ref 6–20)
CALCIUM: 9 mg/dL (ref 8.9–10.3)
CHLORIDE: 102 mmol/L (ref 101–111)
CO2: 21 mmol/L — ABNORMAL LOW (ref 22–32)
Creatinine, Ser: 1.8 mg/dL — ABNORMAL HIGH (ref 0.44–1.00)
GFR, EST AFRICAN AMERICAN: 30 mL/min — AB (ref >60)
GFR, EST NON AFRICAN AMERICAN: 26 mL/min — AB (ref >60)
Glucose, Bld: 157 mg/dL — ABNORMAL HIGH (ref 65–99)
Potassium: 4.2 mmol/L (ref 3.5–5.1)
Sodium: 137 mmol/L (ref 135–145)
Total Protein: 7.3 g/dL (ref 6.5–8.1)

## 2017-02-08 LAB — CBC
HCT: 32.1 % — ABNORMAL LOW (ref 36.0–46.0)
Hemoglobin: 10.2 g/dL — ABNORMAL LOW (ref 12.0–15.0)
MCH: 29.7 pg (ref 26.0–34.0)
MCHC: 31.8 g/dL (ref 30.0–36.0)
MCV: 93.6 fL (ref 78.0–100.0)
Platelets: 348 10*3/uL (ref 150–400)
RBC: 3.43 MIL/uL — ABNORMAL LOW (ref 3.87–5.11)
RDW: 12.9 % (ref 11.5–15.5)
WBC: 11.8 10*3/uL — ABNORMAL HIGH (ref 4.0–10.5)

## 2017-02-08 LAB — APTT: aPTT: 33 seconds (ref 24–36)

## 2017-02-08 LAB — PROTIME-INR
INR: 1.04
Prothrombin Time: 13.5 seconds (ref 11.4–15.2)

## 2017-02-08 NOTE — ED Provider Notes (Signed)
Medical screening examination/treatment/procedure(s) were conducted as a shared visit with non-physician practitioner(s) and myself.  I personally evaluated the patient during the encounter.   EKG Interpretation None       Results for orders placed or performed during the hospital encounter of 02/08/17  Protime-INR  Result Value Ref Range   Prothrombin Time 13.5 11.4 - 15.2 seconds   INR 1.04   APTT  Result Value Ref Range   aPTT 33 24 - 36 seconds  CBC  Result Value Ref Range   WBC 11.8 (H) 4.0 - 10.5 K/uL   RBC 3.43 (L) 3.87 - 5.11 MIL/uL   Hemoglobin 10.2 (L) 12.0 - 15.0 g/dL   HCT 32.1 (L) 36.0 - 46.0 %   MCV 93.6 78.0 - 100.0 fL   MCH 29.7 26.0 - 34.0 pg   MCHC 31.8 30.0 - 36.0 g/dL   RDW 12.9 11.5 - 15.5 %   Platelets 348 150 - 400 K/uL  Differential  Result Value Ref Range   Neutrophils Relative % 83 %   Neutro Abs 9.7 (H) 1.7 - 7.7 K/uL   Lymphocytes Relative 12 %   Lymphs Abs 1.4 0.7 - 4.0 K/uL   Monocytes Relative 5 %   Monocytes Absolute 0.6 0.1 - 1.0 K/uL   Eosinophils Relative 0 %   Eosinophils Absolute 0.0 0.0 - 0.7 K/uL   Basophils Relative 0 %   Basophils Absolute 0.0 0.0 - 0.1 K/uL  Comprehensive metabolic panel  Result Value Ref Range   Sodium 137 135 - 145 mmol/L   Potassium 4.2 3.5 - 5.1 mmol/L   Chloride 102 101 - 111 mmol/L   CO2 21 (L) 22 - 32 mmol/L   Glucose, Bld 157 (H) 65 - 99 mg/dL   BUN 21 (H) 6 - 20 mg/dL   Creatinine, Ser 1.80 (H) 0.44 - 1.00 mg/dL   Calcium 9.0 8.9 - 10.3 mg/dL   Total Protein 7.3 6.5 - 8.1 g/dL   Albumin 4.0 3.5 - 5.0 g/dL   AST 21 15 - 41 U/L   ALT 15 14 - 54 U/L   Alkaline Phosphatase 52 38 - 126 U/L   Total Bilirubin 0.7 0.3 - 1.2 mg/dL   GFR calc non Af Amer 26 (L) >60 mL/min   GFR calc Af Amer 30 (L) >60 mL/min   Anion gap 14 5 - 15  CBG monitoring, ED  Result Value Ref Range   Glucose-Capillary 152 (H) 65 - 99 mg/dL   Comment 1 Notify RN    Comment 2 Document in Chart   I-Stat Chem 8, ED  Result  Value Ref Range   Sodium 139 135 - 145 mmol/L   Potassium 4.3 3.5 - 5.1 mmol/L   Chloride 102 101 - 111 mmol/L   BUN 21 (H) 6 - 20 mg/dL   Creatinine, Ser 1.80 (H) 0.44 - 1.00 mg/dL   Glucose, Bld 154 (H) 65 - 99 mg/dL   Calcium, Ion 1.11 (L) 1.15 - 1.40 mmol/L   TCO2 25 22 - 32 mmol/L   Hemoglobin 10.5 (L) 12.0 - 15.0 g/dL   HCT 31.0 (L) 36.0 - 46.0 %  CBG monitoring, ED  Result Value Ref Range   Glucose-Capillary 163 (H) 65 - 99 mg/dL   Ct Head Wo Contrast  Result Date: 02/08/2017 CLINICAL DATA:  Dizziness.  Focal neurologic deficit. EXAM: CT HEAD WITHOUT CONTRAST TECHNIQUE: Contiguous axial images were obtained from the base of the skull through the vertex without intravenous contrast.  COMPARISON:  04/24/2015 FINDINGS: Brain: The brainstem, cerebellum, cerebral peduncles, thalami, basal ganglia, basilar cisterns, and ventricular system appear within normal limits. Periventricular white matter and corona radiata hypodensities favor chronic ischemic microvascular white matter disease. No intracranial hemorrhage, mass lesion, or acute CVA. Vascular: There is atherosclerotic calcification of the cavernous carotid arteries bilaterally. Dural calcification along the lateral margin of the right cavernous sinus unchanged from prior. Skull: Hyperostosis frontalis interna. Sinuses/Orbits: Unremarkable Other: No supplemental non-categorized findings. IMPRESSION: 1. No acute intracranial findings. 2. Periventricular white matter and corona radiata hypodensities favor chronic ischemic microvascular white matter disease. 3. There is atherosclerotic calcification of the cavernous carotid arteries bilaterally. 4. There is chronic dural calcification medially along the right middle cranial fossa, along with hyperostosis frontalis interna, both unchanged. Electronically Signed   By: Van Clines M.D.   On: 02/08/2017 13:18     Patient seen by me along with the physician assistant.  Patient with onset of  vertigo symptoms last night around 2200.  Had some this morning but not as severe.  Patient's status post previous CVA.  Patient many many years ago had some vertigo but has not had any recently.  Due to her recent history of stroke some concern to rule out that the vertigo could be secondary to a stroke.  Head CT without any acute findings.  But there is definitely atherosclerotic disease.  Patient with some bilateral upper extremity and hand tingling but that is been present for a long period of time.  Otherwise no significant neuro deficits.  Patient refuses the MRI states she is extremely claustrophobic she is been sedated before she can get through it.  She has to have an open MRI which unfortunately we do not have available.  Patient has an appointment with her primary care doctor on Thursday which is not ideal for that today concern for worsening strokes but patient understands the risk wants to follow-up with her doctor.   Fredia Sorrow, MD 02/08/17 [phone]

## 2017-02-08 NOTE — ED Triage Notes (Signed)
Pt endorses dizziness that began last night around 2200. Pt states that this is the way she felt when she had her previous stroke. No neuro deficits. Pt at baseline. Denies any other sx. Hypertensive 188/69

## 2017-02-08 NOTE — ED Provider Notes (Addendum)
[facility] EMERGENCY DEPARTMENT Provider Note   CSN: [id number] Arrival date & time: 02/08/17  1234     History   Chief Complaint Chief Complaint  Patient presents with  . Dizziness    HPI [name] is a 77 y.o. female w PMHx HTN, HLD, GERD, CVA, DM, presenting to ED with acute onset of room-spinning dizziness that began last night around 10pm. Pt states she began feeling nauseous and very dizzy with ambulation. States she felt it hard to keep her balance. Sx made worse with movement, and relieved by rest/sitting still. Has felt intermittently nausea throughout the day, but the dizziness has improved. Denies slurred speech, facial droop, new weakness or numbness, CP, SOB, HA, vision changes, or any other complaints. Reports residual right-sided weakness since previous stroke that was about 7-8 years ago. Concerned for sx because they feel similar to her previous stroke.  Takes aggrenox daily. Pt's daughter reports she is currently at her mental baseline.  The history is provided by the patient.    Past Medical History:  Diagnosis Date  . Anemia   . Asthma   . Chest pain, atypical   . Diabetes mellitus   . GERD (gastroesophageal reflux disease)   . History of transient ischemic attack (TIA)   . Hyperlipidemia   . Hypertension   . OA (osteoarthritis)   . Thyroid carcinoma [facility])     Patient Active Problem List   Diagnosis Date Noted  . AKI (acute kidney injury) ([place]) 04/29/2015  . Acute blood loss anemia 04/29/2015  . Periprosthetic fracture around internal prosthetic right knee joint 04/25/2015  . Fracture, femur, distal, right, closed, initial encounter 04/24/2015  . Acute gout 10/17/2013  . Acute gout of left hand 10/17/2013  . OSTEOARTHRITIS, KNEES, BILATERAL 08/07/2009  . CHEST PAIN, ATYPICAL 02/10/2009  . THYROID CANCER, HX OF 02/10/2009  . BACK PAIN, ACUTE 10/02/2008  . NAUSEA 10/02/2008  . CVA WITH RIGHT HEMIPARESIS 08/15/2008  . HTN  (hypertension) 08/15/2008  . CHEST PAIN UNSPECIFIED 12/18/2007  . LEUKOCYTOSIS UNSPECIFIED 08/14/2007  . HYPOTHYROIDISM 07/03/2007  . DM2 (diabetes mellitus, type 2) ([place]) 07/03/2007  . Spinal epidural abscess 07/03/2007  . Benign essential HTN 07/03/2007  . PSOAS MUSCLE ABSCESS 06/09/2007    Past Surgical History:  Procedure Laterality Date  . BACK SURGERY    . BREAST BIOPSY    . CARDIAC CATHETERIZATION  03/18/2009   NORMAL LEFT VENTRICULAR SIZE AND CONTRACTILITY WITH NORMAL SYSTOLIC  FUNCTION. EF 60%  . CARDIOLITE STUDY     SHOWED A SIGNIFICANT REVERSIBLE ANTERIOR WALL DEFECT CONSISTENT WITH ISCHEMIA. EF 55%  . FEMUR IM NAIL Right 04/25/2015   Procedure: INTRAMEDULLARY (IM) RIGHT  RETROGRADE FEMORAL NAILING;  Surgeon: Rod Can, MD;  Location: [facility] ORS;  Service: Orthopedics;  Laterality: Right;  . LUMBAR FUSION    . THYROIDECTOMY    . TOTAL KNEE ARTHROPLASTY     right    OB History    No data available       Home Medications    Prior to Admission medications   Medication Sig Start Date End Date Taking? Authorizing Provider  alendronate (FOSAMAX) 70 MG tablet Take 70 mg by mouth every 7 (seven) days. Monday. Take with a full glass of water on an empty stomach.   Yes [provider]  amLODipine (NORVASC) 10 MG tablet Take 10 mg by mouth daily.   Yes [provider]  atorvastatin (LIPITOR) 20 MG tablet Take 20 mg by mouth  daily.   Yes [provider]  calcitRIOL (ROCALTROL) 0.25 MCG capsule Take 0.5 mcg by mouth daily.   Yes [provider]  calcium-vitamin D (OSCAL WITH D) 500-200 MG-UNIT per tablet Take 1 tablet by mouth daily with breakfast.   Yes [provider]  dipyridamole-aspirin (AGGRENOX) 25-200 MG per 12 hr capsule Take 1 capsule by mouth 2 (two) times daily.    Yes [provider]  fluticasone (FLONASE) 50 MCG/ACT nasal spray Place 1 spray into the nose daily as needed for allergies.  12/20/12  Yes [provider]  furosemide (LASIX) 20 MG tablet Take 20 mg by mouth daily. 01/27/17  Yes [provider]  HUMULIN N 100 UNIT/ML injection Inject 10 mg as directed daily. 12/02/16  Yes [provider]  hydrochlorothiazide 25 MG tablet Take 25 mg by mouth daily.     Yes [provider]  HYDROcodone-acetaminophen (NORCO/VICODIN) 5-325 MG tablet Take 1 tablet by mouth every 6 (six) hours as needed for moderate pain.   Yes [provider]  levothyroxine (SYNTHROID, LEVOTHROID) 137 MCG tablet Take 137 mcg by mouth daily before breakfast.   Yes [provider]  lisinopril (PRINIVIL,ZESTRIL) 40 MG tablet Take 40 mg by mouth daily.   Yes [provider]  montelukast (SINGULAIR) 10 MG tablet Take 10 mg by mouth daily as needed. 01/27/17  Yes [provider]  trimethoprim (TRIMPEX) 100 MG tablet Take 100 mg by mouth daily. 12/13/16  Yes [provider]  valACYclovir (VALTREX) 1000 MG tablet TAKE 1000 MG BY MOUTH EVERY 12 HOURS AS NEEDED FOR OUTBREAK ON DAY 1 07/05/14  Yes [provider]  acetaminophen (TYLENOL) 325 MG tablet Take 2 tablets (650 mg total) by mouth every 6 (six) hours as needed for mild pain, fever or headache (or Fever >/= 101). Patient not taking: Reported on 07/24/2015 04/29/15   Debbe Odea, MD  aspirin 81 MG chewable tablet Chew 1 tablet (81 mg total) by mouth 2 (two) times daily. Patient not taking: Reported on 07/24/2015 04/29/15   Debbe Odea, MD  bisacodyl (DULCOLAX) 10 MG suppository Place 1 suppository (10 mg total) rectally daily as needed for moderate constipation. Patient not taking: Reported on 07/24/2015 04/29/15   Debbe Odea, MD  cephALEXin (KEFLEX) 500 MG capsule Take 1 capsule (500 mg total) by mouth 2 (two) times daily. Patient not taking: Reported on 02/08/2017 08/11/16   Gean Birchwood, DPM  cephALEXin (KEFLEX) 500 MG capsule Take 1 capsule (500 mg total) by mouth 4 (four) times daily. Patient  not taking: Reported on 02/08/2017 08/11/16   Gean Birchwood, DPM  docusate sodium (COLACE) 100 MG capsule Take 1 capsule (100 mg total) by mouth 2 (two) times daily. Patient not taking: Reported on 07/24/2015 04/29/15   Debbe Odea, MD  levofloxacin (LEVAQUIN) 750 MG tablet Take 1 tablet (750 mg total) by mouth every other day. Patient not taking: Reported on 07/24/2015 04/30/15   Debbe Odea, MD  meclizine (ANTIVERT) 12.5 MG tablet Take 1 tablet (12.5 mg total) by mouth 3 (three) times daily as needed for dizziness. 02/09/17   Robinson, [place] N, PA-C  methocarbamol (ROBAXIN) 500 MG tablet Take 1 tablet (500 mg total) by mouth every 6 (six) hours as needed for muscle spasms. Patient not taking: Reported on 07/24/2015 04/29/15   Debbe Odea, MD  oxyCODONE (OXY IR/ROXICODONE) 5 MG immediate release tablet Take 1 tablet (5 mg total) by mouth every 8 (eight) hours as needed for severe  pain. Patient not taking: Reported on 07/24/2015 04/29/15   Debbe Odea, MD  senna (SENOKOT) 8.6 MG TABS tablet Take 1 tablet (8.6 mg total) by mouth 2 (two) times daily. Patient not taking: Reported on 07/24/2015 04/29/15   Debbe Odea, MD  escitalopram (LEXAPRO) 10 MG tablet Take 10 mg by mouth daily.    06/28/10  [provider]    Family History Family History  Problem Relation Age of Onset  . Pneumonia Mother   . Heart attack Father     Social History Social History   Tobacco Use  . Smoking status: Former Research scientist (life sciences)  . Smokeless tobacco: Never Used  Substance Use Topics  . Alcohol use: No  . Drug use: No     Allergies   Actos [pioglitazone hydrochloride]; Ativan [lorazepam]; Cephalexin; Oxycodone; Rosiglitazone; and Tramadol   Review of Systems Review of Systems  Constitutional: Negative for fever.  HENT: Negative for congestion, ear discharge and ear pain.   Eyes: Negative for photophobia and visual disturbance.  Respiratory: Negative for shortness of breath.   Cardiovascular: Negative  for chest pain.  Gastrointestinal: Positive for nausea.  Neurological: Positive for dizziness. Negative for facial asymmetry, speech difficulty, weakness, numbness and headaches.  Psychiatric/Behavioral: Negative for confusion.  All other systems reviewed and are negative.    Physical Exam Updated Vital Signs BP (!) 154/54   Pulse 80   Temp 98.4 F (36.9 C)   Resp 19   Ht 5\' 4"  (1.626 m)   Wt 110.7 kg (244 lb)   SpO2 100%   BMI 41.88 kg/m   Physical Exam  Constitutional: She is oriented to person, place, and time. She appears well-developed and well-nourished. No distress.  HENT:  Head: Normocephalic and atraumatic.  Eyes: Conjunctivae and EOM are normal. Pupils are equal, round, and reactive to light.  Neck: Normal range of motion. Neck supple.  Cardiovascular: Normal rate, regular rhythm, normal heart sounds and intact distal pulses. Exam reveals no gallop and no friction rub.  No murmur heard. Pulmonary/Chest: Effort normal and breath sounds normal. No stridor. No respiratory distress. She has no wheezes. She has no rales.  Abdominal: Soft. Bowel sounds are normal. She exhibits no distension. There is no rebound and no guarding.  Musculoskeletal:  1+ pitting pretibial edema R>L (chronic)  Neurological: She is alert and oriented to person, place, and time.  Mental Status:  Alert, oriented, thought content appropriate, able to give a coherent history. Speech fluent without evidence of aphasia. Able to follow 2 step commands without difficulty.  Cranial Nerves:  II:  Peripheral visual fields grossly normal, pupils equal, round, reactive to light III,IV, VI: ptosis not present, extra-ocular motions intact bilaterally  V,VII: smile symmetric, facial light touch sensation equal VIII: hearing grossly normal to voice  X: uvula elevates symmetrically  XI: bilateral shoulder shrug symmetric and strong XII: midline tongue extension without fassiculations Motor:  Normal tone.  Slightly decreased R upper and lower extremities strength compared to left (residual weakness s/p CVA) Sensory: Pinprick and light touch normal in all extremities.  Deep Tendon Reflexes: 2+ and symmetric in the biceps and patella Cerebellar: normal finger-to-nose with bilateral upper extremities; mild intention tremor noted Gait: normal gait and balance CV: distal pulses palpable throughout    Skin: Skin is warm.  Psychiatric: She has a normal mood and affect. Her behavior is normal.  Nursing note and vitals reviewed.    ED Treatments / Results  Labs (all labs ordered are listed, but only abnormal  results are displayed) Labs Reviewed  CBC - Abnormal; Notable for the following components:      Result Value   WBC 11.8 (*)    RBC 3.43 (*)    Hemoglobin 10.2 (*)    HCT 32.1 (*)    All other components within normal limits  DIFFERENTIAL - Abnormal; Notable for the following components:   Neutro Abs 9.7 (*)    All other components within normal limits  COMPREHENSIVE METABOLIC PANEL - Abnormal; Notable for the following components:   CO2 21 (*)    Glucose, Bld 157 (*)    BUN 21 (*)    Creatinine, Ser 1.80 (*)    GFR calc non Af Amer 26 (*)    GFR calc Af Amer 30 (*)    All other components within normal limits  CBG MONITORING, ED - Abnormal; Notable for the following components:   Glucose-Capillary 152 (*)    All other components within normal limits  I-STAT CHEM 8, ED - Abnormal; Notable for the following components:   BUN 21 (*)    Creatinine, Ser 1.80 (*)    Glucose, Bld 154 (*)    Calcium, Ion 1.11 (*)    Hemoglobin 10.5 (*)    HCT 31.0 (*)    All other components within normal limits  CBG MONITORING, ED - Abnormal; Notable for the following components:   Glucose-Capillary 163 (*)    All other components within normal limits  PROTIME-INR  APTT  I-STAT TROPONIN, ED    EKG  EKG Interpretation None     ED ECG REPORT   Date: 02/08/2017  Rate: 82  Rhythm: normal  sinus rhythm  QRS Axis: left  Intervals: normal  ST/T Wave abnormalities: normal  Conduction Disutrbances:none  Narrative Interpretation:   Old EKG Reviewed: unchanged  I have personally reviewed the EKG tracing and agree with the computerized printout as noted.   Radiology Ct Head Wo Contrast  Result Date: 02/08/2017 CLINICAL DATA:  Dizziness.  Focal neurologic deficit. EXAM: CT HEAD WITHOUT CONTRAST TECHNIQUE: Contiguous axial images were obtained from the base of the skull through the vertex without intravenous contrast. COMPARISON:  04/24/2015 FINDINGS: Brain: The brainstem, cerebellum, cerebral peduncles, thalami, basal ganglia, basilar cisterns, and ventricular system appear within normal limits. Periventricular white matter and corona radiata hypodensities favor chronic ischemic microvascular white matter disease. No intracranial hemorrhage, mass lesion, or acute CVA. Vascular: There is atherosclerotic calcification of the cavernous carotid arteries bilaterally. Dural calcification along the lateral margin of the right cavernous sinus unchanged from prior. Skull: Hyperostosis frontalis interna. Sinuses/Orbits: Unremarkable Other: No supplemental non-categorized findings. IMPRESSION: 1. No acute intracranial findings. 2. Periventricular white matter and corona radiata hypodensities favor chronic ischemic microvascular white matter disease. 3. There is atherosclerotic calcification of the cavernous carotid arteries bilaterally. 4. There is chronic dural calcification medially along the right middle cranial fossa, along with hyperostosis frontalis interna, both unchanged. Electronically Signed   By: Van Clines M.D.   On: 02/08/2017 13:18    Procedures Procedures (including critical care time)  Medications Ordered in ED Medications  ondansetron (ZOFRAN-ODT) disintegrating tablet 4 mg (4 mg Oral Given 02/09/17 0010)  meclizine (ANTIVERT) tablet 25 mg (25 mg Oral Given 02/09/17 0010)      Initial Impression / Assessment and Plan / ED Course  I have reviewed the triage vital signs and the nursing notes.  Pertinent labs & imaging results that were available during my care of the patient were reviewed by me and  considered in my medical decision making (see chart for details).     Patient presenting with acute onset of nausea and room spinning dizziness since last night at 10 PM.  Dizziness is worse with position changes, improved with sitting still.  History of CVA with residual right-sided weakness, however no new focal deficits on exam today.  CT head done in triage, negative for acute pathology. Labs appear to be at baseline.  Given patient's symptoms, questionable remote hx of vertigo without repetition, as well as similarity in symptoms compared to previous CVA, MRI was ordered.  Patient decided she did not want MRI, and concerned for claustrophobia. Offered PRN medication to help cope with claustrophobia. However, she states she has PCP appointment on Thursday, and would prefer to get outpatient MRI done.  Patient evaluated by Dr. Rogene Houston, who also discussed risks of delaying MRI.  Patient seems reliable for follow-up.  Discharge with meclizine for dizziness.  Strict return precautions discussed.  Discussed results, findings, treatment and follow up. Patient advised of return precautions. Patient verbalized understanding and agreed with plan.   Final Clinical Impressions(s) / ED Diagnoses   Final diagnoses:  Dizziness  Nausea    ED Discharge Orders        Ordered    meclizine (ANTIVERT) 12.5 MG tablet  3 times daily PRN     02/09/17 0007       Robinson, [place] N, PA-C 02/09/17 0017    Fredia Sorrow, MD 02/10/17 1615    Robinson, [place] N, PA-C 02/23/17 2761    Fredia Sorrow, MD 03/04/17 [phone]

## 2017-02-08 NOTE — ED Notes (Signed)
ED Provider at bedside. 

## 2017-02-08 NOTE — ED Triage Notes (Signed)
Caregiver leaving, sister Maretta Bees, [phone] to be called.

## 2017-02-08 NOTE — ED Notes (Signed)
Results reviewed

## 2017-02-09 MED ORDER — ONDANSETRON 4 MG PO TBDP
4.0000 mg | ORAL_TABLET | Freq: Once | ORAL | Status: AC
Start: 2017-02-09 — End: 2017-02-09
  Administered 2017-02-09: 4 mg via ORAL
  Filled 2017-02-09: qty 1

## 2017-02-09 MED ORDER — MECLIZINE HCL 25 MG PO TABS
25.0000 mg | ORAL_TABLET | Freq: Once | ORAL | Status: AC
  Administered 2017-02-09: 25 mg via ORAL
  Filled 2017-02-09: qty 1

## 2017-02-09 MED ORDER — MECLIZINE HCL 12.5 MG PO TABS: 12.5000 mg | ORAL_TABLET | Freq: Three times a day (TID) | ORAL | 0 refills | Status: DC | PRN

## 2017-02-09 NOTE — ED Notes (Signed)
ED Provider at bedside. 

## 2017-02-09 NOTE — Discharge Instructions (Signed)
It is important that you follow-up with your primary care provider regarding your visit today. Return to the ER for difficulty speaking, facial droop, severe headache, vision changes, weakness in your extremities, or new or concerning symptoms.

## 2017-03-18 ENCOUNTER — Other Ambulatory Visit: Payer: Self-pay | Admitting: Family Medicine

## 2017-03-18 DIAGNOSIS — M4805 Spinal stenosis, thoracolumbar region: Secondary | ICD-10-CM

## 2017-04-01 ENCOUNTER — Ambulatory Visit
Admission: RE | Admit: 2017-04-01 | Discharge: 2017-04-01 | Disposition: A | Discharge status: Home / Self Care | Payer: Medicare Other | Source: Ambulatory Visit | Attending: Family Medicine | Admitting: Family Medicine

## 2017-04-01 DIAGNOSIS — M4805 Spinal stenosis, thoracolumbar region: Secondary | ICD-10-CM

## 2017-04-02 ENCOUNTER — Encounter (HOSPITAL_COMMUNITY): Payer: Self-pay | Admitting: Emergency Medicine

## 2017-04-02 ENCOUNTER — Inpatient Hospital Stay (HOSPITAL_COMMUNITY)
Admission: EM | Admit: 2017-04-02 | Discharge: 2017-04-09 | DRG: 552 | Disposition: A | Discharge status: Home Health | Payer: Medicare Other | Attending: Internal Medicine | Admitting: Internal Medicine

## 2017-04-02 ENCOUNTER — Emergency Department (HOSPITAL_COMMUNITY): Payer: Medicare Other

## 2017-04-02 ENCOUNTER — Other Ambulatory Visit: Payer: Self-pay

## 2017-04-02 DIAGNOSIS — M4624 Osteomyelitis of vertebra, thoracic region: Secondary | ICD-10-CM | POA: Diagnosis present

## 2017-04-02 DIAGNOSIS — Z8673 Personal history of transient ischemic attack (TIA), and cerebral infarction without residual deficits: Secondary | ICD-10-CM

## 2017-04-02 DIAGNOSIS — Z5181 Encounter for therapeutic drug level monitoring: Secondary | ICD-10-CM | POA: Diagnosis not present

## 2017-04-02 DIAGNOSIS — Z79899 Other long term (current) drug therapy: Secondary | ICD-10-CM | POA: Diagnosis not present

## 2017-04-02 DIAGNOSIS — Z794 Long term (current) use of insulin: Secondary | ICD-10-CM

## 2017-04-02 DIAGNOSIS — I5022 Chronic systolic (congestive) heart failure: Secondary | ICD-10-CM | POA: Diagnosis present

## 2017-04-02 DIAGNOSIS — Z885 Allergy status to narcotic agent status: Secondary | ICD-10-CM

## 2017-04-02 DIAGNOSIS — I130 Hypertensive heart and chronic kidney disease with heart failure and stage 1 through stage 4 chronic kidney disease, or unspecified chronic kidney disease: Secondary | ICD-10-CM | POA: Diagnosis present

## 2017-04-02 DIAGNOSIS — Z981 Arthrodesis status: Secondary | ICD-10-CM

## 2017-04-02 DIAGNOSIS — Z792 Long term (current) use of antibiotics: Secondary | ICD-10-CM | POA: Diagnosis not present

## 2017-04-02 DIAGNOSIS — G629 Polyneuropathy, unspecified: Secondary | ICD-10-CM | POA: Diagnosis present

## 2017-04-02 DIAGNOSIS — Z87891 Personal history of nicotine dependence: Secondary | ICD-10-CM | POA: Diagnosis not present

## 2017-04-02 DIAGNOSIS — E890 Postprocedural hypothyroidism: Secondary | ICD-10-CM | POA: Diagnosis not present

## 2017-04-02 DIAGNOSIS — Z881 Allergy status to other antibiotic agents status: Secondary | ICD-10-CM | POA: Diagnosis not present

## 2017-04-02 DIAGNOSIS — N183 Chronic kidney disease, stage 3 (moderate): Secondary | ICD-10-CM | POA: Diagnosis present

## 2017-04-02 DIAGNOSIS — Z7983 Long term (current) use of bisphosphonates: Secondary | ICD-10-CM | POA: Diagnosis not present

## 2017-04-02 DIAGNOSIS — N189 Chronic kidney disease, unspecified: Secondary | ICD-10-CM | POA: Diagnosis present

## 2017-04-02 DIAGNOSIS — E119 Type 2 diabetes mellitus without complications: Secondary | ICD-10-CM | POA: Diagnosis not present

## 2017-04-02 DIAGNOSIS — E039 Hypothyroidism, unspecified: Secondary | ICD-10-CM | POA: Diagnosis present

## 2017-04-02 DIAGNOSIS — I10 Essential (primary) hypertension: Secondary | ICD-10-CM | POA: Diagnosis present

## 2017-04-02 DIAGNOSIS — E785 Hyperlipidemia, unspecified: Secondary | ICD-10-CM | POA: Diagnosis present

## 2017-04-02 DIAGNOSIS — D649 Anemia, unspecified: Secondary | ICD-10-CM | POA: Diagnosis not present

## 2017-04-02 DIAGNOSIS — N390 Urinary tract infection, site not specified: Secondary | ICD-10-CM | POA: Diagnosis present

## 2017-04-02 DIAGNOSIS — N179 Acute kidney failure, unspecified: Secondary | ICD-10-CM | POA: Diagnosis not present

## 2017-04-02 DIAGNOSIS — K5900 Constipation, unspecified: Secondary | ICD-10-CM | POA: Diagnosis present

## 2017-04-02 DIAGNOSIS — Z87440 Personal history of urinary (tract) infections: Secondary | ICD-10-CM | POA: Diagnosis not present

## 2017-04-02 DIAGNOSIS — M4644 Discitis, unspecified, thoracic region: Secondary | ICD-10-CM | POA: Diagnosis present

## 2017-04-02 DIAGNOSIS — M4640 Discitis, unspecified, site unspecified: Secondary | ICD-10-CM

## 2017-04-02 DIAGNOSIS — M7989 Other specified soft tissue disorders: Secondary | ICD-10-CM | POA: Diagnosis present

## 2017-04-02 DIAGNOSIS — Z85850 Personal history of malignant neoplasm of thyroid: Secondary | ICD-10-CM

## 2017-04-02 DIAGNOSIS — Z888 Allergy status to other drugs, medicaments and biological substances status: Secondary | ICD-10-CM | POA: Diagnosis not present

## 2017-04-02 DIAGNOSIS — M545 Low back pain: Secondary | ICD-10-CM | POA: Diagnosis not present

## 2017-04-02 DIAGNOSIS — G8929 Other chronic pain: Secondary | ICD-10-CM | POA: Diagnosis not present

## 2017-04-02 DIAGNOSIS — Z96651 Presence of right artificial knee joint: Secondary | ICD-10-CM | POA: Diagnosis present

## 2017-04-02 DIAGNOSIS — E1122 Type 2 diabetes mellitus with diabetic chronic kidney disease: Secondary | ICD-10-CM | POA: Diagnosis present

## 2017-04-02 DIAGNOSIS — K5909 Other constipation: Secondary | ICD-10-CM | POA: Diagnosis present

## 2017-04-02 DIAGNOSIS — Z79890 Hormone replacement therapy (postmenopausal): Secondary | ICD-10-CM

## 2017-04-02 DIAGNOSIS — M549 Dorsalgia, unspecified: Secondary | ICD-10-CM

## 2017-04-02 DIAGNOSIS — R634 Abnormal weight loss: Secondary | ICD-10-CM | POA: Diagnosis not present

## 2017-04-02 DIAGNOSIS — Z6841 Body Mass Index (BMI) 40.0 and over, adult: Secondary | ICD-10-CM | POA: Diagnosis not present

## 2017-04-02 DIAGNOSIS — D631 Anemia in chronic kidney disease: Secondary | ICD-10-CM | POA: Diagnosis present

## 2017-04-02 DIAGNOSIS — K219 Gastro-esophageal reflux disease without esophagitis: Secondary | ICD-10-CM | POA: Diagnosis present

## 2017-04-02 DIAGNOSIS — Z7982 Long term (current) use of aspirin: Secondary | ICD-10-CM

## 2017-04-02 LAB — CBC WITH DIFFERENTIAL/PLATELET
BASOS ABS: 0 10*3/uL (ref 0.0–0.1)
BASOS PCT: 0 %
EOS ABS: 0.1 10*3/uL (ref 0.0–0.7)
EOS PCT: 1 %
HCT: 29 % — ABNORMAL LOW (ref 36.0–46.0)
HEMOGLOBIN: 9.2 g/dL — AB (ref 12.0–15.0)
LYMPHS ABS: 1.6 10*3/uL (ref 0.7–4.0)
Lymphocytes Relative: 16 %
MCH: 31 pg (ref 26.0–34.0)
MCHC: 31.7 g/dL (ref 30.0–36.0)
MCV: 97.6 fL (ref 78.0–100.0)
Monocytes Absolute: 0.6 10*3/uL (ref 0.1–1.0)
Monocytes Relative: 6 %
NEUTROS PCT: 77 %
Neutro Abs: 8 10*3/uL — ABNORMAL HIGH (ref 1.7–7.7)
Platelets: 339 10*3/uL (ref 150–400)
RBC: 2.97 MIL/uL — AB (ref 3.87–5.11)
RDW: 13.9 % (ref 11.5–15.5)
WBC: 10.4 10*3/uL (ref 4.0–10.5)

## 2017-04-02 LAB — URINALYSIS, ROUTINE W REFLEX MICROSCOPIC
Bilirubin Urine: NEGATIVE
GLUCOSE, UA: NEGATIVE mg/dL
HGB URINE DIPSTICK: NEGATIVE
Ketones, ur: NEGATIVE mg/dL
Leukocytes, UA: NEGATIVE
Nitrite: NEGATIVE
PH: 7 (ref 5.0–8.0)
PROTEIN: NEGATIVE mg/dL
Specific Gravity, Urine: 1.005 (ref 1.005–1.030)

## 2017-04-02 LAB — COMPREHENSIVE METABOLIC PANEL
ALT: 11 U/L — AB (ref 14–54)
AST: 14 U/L — ABNORMAL LOW (ref 15–41)
Albumin: 3.9 g/dL (ref 3.5–5.0)
Alkaline Phosphatase: 48 U/L (ref 38–126)
Anion gap: 10 (ref 5–15)
BILIRUBIN TOTAL: 0.4 mg/dL (ref 0.3–1.2)
BUN: 27 mg/dL — AB (ref 6–20)
CALCIUM: 9.1 mg/dL (ref 8.9–10.3)
CHLORIDE: 104 mmol/L (ref 101–111)
CO2: 28 mmol/L (ref 22–32)
CREATININE: 1.62 mg/dL — AB (ref 0.44–1.00)
GFR, EST AFRICAN AMERICAN: 34 mL/min — AB (ref >60)
GFR, EST NON AFRICAN AMERICAN: 29 mL/min — AB (ref >60)
Glucose, Bld: 149 mg/dL — ABNORMAL HIGH (ref 65–99)
Potassium: 4 mmol/L (ref 3.5–5.1)
Sodium: 142 mmol/L (ref 135–145)
TOTAL PROTEIN: 7.1 g/dL (ref 6.5–8.1)

## 2017-04-02 LAB — GLUCOSE, CAPILLARY
GLUCOSE-CAPILLARY: 138 mg/dL — AB (ref 65–99)
GLUCOSE-CAPILLARY: 125 mg/dL — AB (ref 65–99)

## 2017-04-02 LAB — I-STAT CG4 LACTIC ACID, ED: Lactic Acid, Venous: 0.73 mmol/L (ref 0.5–1.9)

## 2017-04-02 LAB — C-REACTIVE PROTEIN: CRP: <0.8 mg/dL (ref <1.0)

## 2017-04-02 LAB — SEDIMENTATION RATE: Sed Rate: 45 mm/hr — ABNORMAL HIGH (ref 0–22)

## 2017-04-02 MED ORDER — FLUTICASONE PROPIONATE 50 MCG/ACT NA SUSP
1.0000 | Freq: Every day | NASAL | Status: DC | PRN
  Filled 2017-04-02: qty 16

## 2017-04-02 MED ORDER — HEPARIN SODIUM (PORCINE) 5000 UNIT/ML IJ SOLN
5000.0000 [IU] | Freq: Three times a day (TID) | INTRAMUSCULAR | Status: DC
  Administered 2017-04-02 – 2017-04-09 (×15): 5000 [IU] via SUBCUTANEOUS
  Filled 2017-04-02 (×15): qty 1

## 2017-04-02 MED ORDER — ONDANSETRON HCL 4 MG/2ML IJ SOLN
4.0000 mg | Freq: Once | INTRAMUSCULAR | Status: AC
  Administered 2017-04-02: 4 mg via INTRAVENOUS
  Filled 2017-04-02: qty 2

## 2017-04-02 MED ORDER — CALCITRIOL 0.5 MCG PO CAPS
0.5000 ug | ORAL_CAPSULE | Freq: Every day | ORAL | Status: DC
  Administered 2017-04-02 – 2017-04-09 (×8): 0.5 ug via ORAL
  Filled 2017-04-02 (×9): qty 1

## 2017-04-02 MED ORDER — MORPHINE SULFATE (PF) 4 MG/ML IV SOLN
4.0000 mg | INTRAVENOUS | Status: AC | PRN
  Administered 2017-04-02 – 2017-04-03 (×2): 4 mg via INTRAVENOUS
  Filled 2017-04-02 (×2): qty 1

## 2017-04-02 MED ORDER — ENOXAPARIN SODIUM 60 MG/0.6ML ~~LOC~~ SOLN: 55.0000 mg | SUBCUTANEOUS | Status: DC

## 2017-04-02 MED ORDER — VANCOMYCIN HCL 10 G IV SOLR
2000.0000 mg | Freq: Once | INTRAVENOUS | Status: AC
  Administered 2017-04-02: 2000 mg via INTRAVENOUS
  Filled 2017-04-02: qty 2000

## 2017-04-02 MED ORDER — INSULIN ASPART 100 UNIT/ML ~~LOC~~ SOLN
0.0000 [IU] | Freq: Three times a day (TID) | SUBCUTANEOUS | Status: DC
  Administered 2017-04-03 – 2017-04-04 (×3): 1 [IU] via SUBCUTANEOUS
  Administered 2017-04-04 (×2): 2 [IU] via SUBCUTANEOUS
  Administered 2017-04-05 (×2): 1 [IU] via SUBCUTANEOUS
  Administered 2017-04-05: 2 [IU] via SUBCUTANEOUS
  Administered 2017-04-06 – 2017-04-07 (×4): 1 [IU] via SUBCUTANEOUS
  Administered 2017-04-07: 2 [IU] via SUBCUTANEOUS
  Administered 2017-04-07 – 2017-04-09 (×6): 1 [IU] via SUBCUTANEOUS

## 2017-04-02 MED ORDER — MORPHINE SULFATE (PF) 4 MG/ML IV SOLN
4.0000 mg | INTRAVENOUS | Status: AC | PRN
  Administered 2017-04-02 (×2): 4 mg via INTRAVENOUS
  Filled 2017-04-02 (×3): qty 1

## 2017-04-02 MED ORDER — ASPIRIN-DIPYRIDAMOLE ER 25-200 MG PO CP12
1.0000 | ORAL_CAPSULE | Freq: Two times a day (BID) | ORAL | Status: DC
  Administered 2017-04-02 – 2017-04-09 (×12): 1 via ORAL
  Filled 2017-04-02 (×15): qty 1

## 2017-04-02 MED ORDER — CALCIUM CARBONATE-VITAMIN D 500-200 MG-UNIT PO TABS
1.0000 | ORAL_TABLET | Freq: Every day | ORAL | Status: DC
  Administered 2017-04-02 – 2017-04-09 (×8): 1 via ORAL
  Filled 2017-04-02 (×7): qty 1

## 2017-04-02 MED ORDER — ATORVASTATIN CALCIUM 10 MG PO TABS
20.0000 mg | ORAL_TABLET | Freq: Every day | ORAL | Status: DC
  Administered 2017-04-02 – 2017-04-06 (×5): 20 mg via ORAL
  Filled 2017-04-02 (×5): qty 2

## 2017-04-02 MED ORDER — AMLODIPINE BESYLATE 10 MG PO TABS
10.0000 mg | ORAL_TABLET | Freq: Every day | ORAL | Status: DC
  Administered 2017-04-02 – 2017-04-09 (×8): 10 mg via ORAL
  Filled 2017-04-02 (×8): qty 1

## 2017-04-02 MED ORDER — SODIUM CHLORIDE 0.9 % IV SOLN
INTRAVENOUS | Status: AC
  Administered 2017-04-02: 13:00:00 via INTRAVENOUS

## 2017-04-02 MED ORDER — SODIUM CHLORIDE 0.9 % IV SOLN
2.0000 g | Freq: Once | INTRAVENOUS | Status: AC
  Administered 2017-04-02: 2 g via INTRAVENOUS
  Filled 2017-04-02: qty 2

## 2017-04-02 NOTE — ED Notes (Signed)
Notified Dr Zigmund Daniel that pt is unable to fit in MRI machine. Dr Zigmund Daniel sts to send pt up to assigned room

## 2017-04-02 NOTE — ED Notes (Signed)
Patient transported to MRI 

## 2017-04-02 NOTE — ED Provider Notes (Signed)
[facility] DEPT Provider Note   CSN: [id number] Arrival date & time: 04/02/17  3086     History   Chief Complaint Chief Complaint  Patient presents with  . Back Pain    HPI [name] is a 77 y.o. female.  HPI  Pt was seen at 0810. Per pt and her family, c/o gradual onset and worsening of constant mid and low back "pain" for the past 6 weeks. Pt states she had been taking hydrocodone with "some" relief. Pt describes the pain as located in her TS and LS, and wraps around the right side of her torso. Pain worsens with palpation of the area and body position changes. Pt was evaluated by her PMD yesterday, had an outpt MRI "that showed I might have an infection in my back" and was told to come to the ED for further evaluation. Denies incont/retention of bowel or bladder, no saddle anesthesia, no focal motor weakness, no tingling/numbness in extremities, no fevers, no injury, no abd pain, no CP/SOB, no rash.    Past Medical History:  Diagnosis Date  . Anemia   . Asthma   . Chest pain, atypical   . Diabetes mellitus   . GERD (gastroesophageal reflux disease)   . History of transient ischemic attack (TIA)   . Hyperlipidemia   . Hypertension   . OA (osteoarthritis)   . Thyroid carcinoma [facility])     Patient Active Problem List   Diagnosis Date Noted  . AKI (acute kidney injury) ([place]) 04/29/2015  . Acute blood loss anemia 04/29/2015  . Periprosthetic fracture around internal prosthetic right knee joint 04/25/2015  . Fracture, femur, distal, right, closed, initial encounter 04/24/2015  . Acute gout 10/17/2013  . Acute gout of left hand 10/17/2013  . OSTEOARTHRITIS, KNEES, BILATERAL 08/07/2009  . CHEST PAIN, ATYPICAL 02/10/2009  . THYROID CANCER, HX OF 02/10/2009  . BACK PAIN, ACUTE 10/02/2008  . NAUSEA 10/02/2008  . CVA WITH RIGHT HEMIPARESIS 08/15/2008  . HTN (hypertension) 08/15/2008  . CHEST PAIN UNSPECIFIED 12/18/2007  . LEUKOCYTOSIS  UNSPECIFIED 08/14/2007  . HYPOTHYROIDISM 07/03/2007  . DM2 (diabetes mellitus, type 2) ([place]) 07/03/2007  . Spinal epidural abscess 07/03/2007  . Benign essential HTN 07/03/2007  . PSOAS MUSCLE ABSCESS 06/09/2007    Past Surgical History:  Procedure Laterality Date  . BACK SURGERY    . BREAST BIOPSY    . CARDIAC CATHETERIZATION  03/18/2009   NORMAL LEFT VENTRICULAR SIZE AND CONTRACTILITY WITH NORMAL SYSTOLIC  FUNCTION. EF 60%  . CARDIOLITE STUDY     SHOWED A SIGNIFICANT REVERSIBLE ANTERIOR WALL DEFECT CONSISTENT WITH ISCHEMIA. EF 55%  . FEMUR IM NAIL Right 04/25/2015   Procedure: INTRAMEDULLARY (IM) RIGHT  RETROGRADE FEMORAL NAILING;  Surgeon: Rod Can, MD;  Location: [facility] ORS;  Service: Orthopedics;  Laterality: Right;  . LUMBAR FUSION    . THYROIDECTOMY    . TOTAL KNEE ARTHROPLASTY     right     OB History   None      Home Medications    Prior to Admission medications   Medication Sig Start Date End Date Taking? Authorizing Provider  acetaminophen (TYLENOL) 325 MG tablet Take 2 tablets (650 mg total) by mouth every 6 (six) hours as needed for mild pain, fever or headache (or Fever >/= 101). Patient not taking: Reported on 07/24/2015 04/29/15   Debbe Odea, MD  alendronate (FOSAMAX) 70 MG tablet Take 70 mg by mouth every 7 (seven) days. Monday. Take with a full  glass of water on an empty stomach.    [provider]  amLODipine (NORVASC) 10 MG tablet Take 10 mg by mouth daily.    [provider]  aspirin 81 MG chewable tablet Chew 1 tablet (81 mg total) by mouth 2 (two) times daily. Patient not taking: Reported on 07/24/2015 04/29/15   Debbe Odea, MD  atorvastatin (LIPITOR) 20 MG tablet Take 20 mg by mouth daily.    [provider]  bisacodyl (DULCOLAX) 10 MG suppository Place 1 suppository (10 mg total) rectally daily as needed for moderate constipation. Patient not taking: Reported on 07/24/2015 04/29/15   Debbe Odea, MD  calcitRIOL  (ROCALTROL) 0.25 MCG capsule Take 0.5 mcg by mouth daily.    [provider]  calcium-vitamin D (OSCAL WITH D) 500-200 MG-UNIT per tablet Take 1 tablet by mouth daily with breakfast.    [provider]  cephALEXin (KEFLEX) 500 MG capsule Take 1 capsule (500 mg total) by mouth 2 (two) times daily. Patient not taking: Reported on 02/08/2017 08/11/16   Gean Birchwood, DPM  cephALEXin (KEFLEX) 500 MG capsule Take 1 capsule (500 mg total) by mouth 4 (four) times daily. Patient not taking: Reported on 02/08/2017 08/11/16   Gean Birchwood, DPM  dipyridamole-aspirin (AGGRENOX) 25-200 MG per 12 hr capsule Take 1 capsule by mouth 2 (two) times daily.     [provider]  docusate sodium (COLACE) 100 MG capsule Take 1 capsule (100 mg total) by mouth 2 (two) times daily. Patient not taking: Reported on 07/24/2015 04/29/15   Debbe Odea, MD  fluticasone [facility]) 50 MCG/ACT nasal spray Place 1 spray into the nose daily as needed for allergies.  12/20/12   [provider]  furosemide (LASIX) 20 MG tablet Take 20 mg by mouth daily. 01/27/17   [provider]  HUMULIN N 100 UNIT/ML injection Inject 10 mg as directed daily. 12/02/16   [provider]  hydrochlorothiazide 25 MG tablet Take 25 mg by mouth daily.      [provider]  HYDROcodone-acetaminophen (NORCO/VICODIN) 5-325 MG tablet Take 1 tablet by mouth every 6 (six) hours as needed for moderate pain.    [provider]  levofloxacin (LEVAQUIN) 750 MG tablet Take 1 tablet (750 mg total) by mouth every other day. Patient not taking: Reported on 07/24/2015 04/30/15   Debbe Odea, MD  levothyroxine (SYNTHROID, LEVOTHROID) 137 MCG tablet Take 137 mcg by mouth daily before breakfast.    [provider]  lisinopril (PRINIVIL,ZESTRIL) 40 MG tablet Take 40 mg by mouth daily.    [provider]  meclizine (ANTIVERT) 12.5 MG tablet Take 1 tablet (12.5 mg total) by mouth 3 (three)  times daily as needed for dizziness. 02/09/17   Robinson, [place] N, PA-C  methocarbamol (ROBAXIN) 500 MG tablet Take 1 tablet (500 mg total) by mouth every 6 (six) hours as needed for muscle spasms. Patient not taking: Reported on 07/24/2015 04/29/15   Debbe Odea, MD  montelukast (SINGULAIR) 10 MG tablet Take 10 mg by mouth daily as needed. 01/27/17   [provider]  oxyCODONE (OXY IR/ROXICODONE) 5 MG immediate release tablet Take 1 tablet (5 mg total) by mouth every 8 (eight) hours as needed for severe pain. Patient not taking: Reported on 07/24/2015 04/29/15   Debbe Odea, MD  senna (SENOKOT) 8.6 MG TABS tablet Take 1 tablet (8.6 mg total) by mouth 2 (two) times daily. Patient not taking: Reported on 07/24/2015 04/29/15   Debbe Odea, MD  trimethoprim (TRIMPEX) 100 MG tablet Take 100 mg by mouth daily. 12/13/16   [provider]  valACYclovir (VALTREX) 1000 MG tablet TAKE 1000 MG BY MOUTH EVERY 12 HOURS AS NEEDED FOR OUTBREAK ON DAY 1 07/05/14   [provider]  escitalopram (LEXAPRO) 10 MG tablet Take 10 mg by mouth daily.    06/28/10  [provider]    Family History Family History  Problem Relation Age of Onset  . Pneumonia Mother   . Heart attack Father     Social History Social History   Tobacco Use  . Smoking status: Former Research scientist (life sciences)  . Smokeless tobacco: Never Used  Substance Use Topics  . Alcohol use: No  . Drug use: No     Allergies   Actos [pioglitazone hydrochloride]; Ativan [lorazepam]; Cephalexin; Oxycodone; Rosiglitazone; and Tramadol   Review of Systems Review of Systems ROS: Statement: All systems negative except as marked or noted in the HPI; Constitutional: Negative for fever and chills. ; ; Eyes: Negative for eye pain, redness and discharge. ; ; ENMT: Negative for ear pain, hoarseness, nasal congestion, sinus pressure and sore throat. ; ; Cardiovascular: Negative for chest pain, palpitations, diaphoresis, dyspnea and peripheral  edema. ; ; Respiratory: Negative for cough, wheezing and stridor. ; ; Gastrointestinal: Negative for nausea, vomiting, diarrhea, abdominal pain, blood in stool, hematemesis, jaundice and rectal bleeding. . ; ; Genitourinary: Negative for dysuria, flank pain and hematuria. ; ; Musculoskeletal: +back pain. Negative for neck pain. Negative for swelling and trauma.; ; Skin: Negative for pruritus, rash, abrasions, blisters, bruising and skin lesion.; ; Neuro: Negative for headache, lightheadedness and neck stiffness. Negative for weakness, altered level of consciousness, altered mental status, extremity weakness, paresthesias, involuntary movement, seizure and syncope.       Physical Exam Updated Vital Signs BP (!) 144/49 (BP Location: Left Arm)   Pulse 74   Temp 98.1 F (36.7 C) (Oral)   Resp 19   SpO2 99%   Physical Exam 0815: Physical examination:  Nursing notes reviewed; Vital signs and O2 SAT reviewed;  Constitutional: Well developed, Well nourished, Well hydrated, In no acute distress; Head:  Normocephalic, atraumatic; Eyes: EOMI, PERRL, No scleral icterus; ENMT: Mouth and pharynx normal, Mucous membranes moist; Neck: Supple, Full range of motion, No lymphadenopathy; Cardiovascular: Regular rate and rhythm, No gallop; Respiratory: Breath sounds clear & equal bilaterally, No wheezes.  Speaking full sentences with ease, Normal respiratory effort/excursion; Chest: Nontender, Movement normal; Abdomen: Soft, Nontender, Nondistended, Normal bowel sounds; Genitourinary: No CVA tenderness; Spine:  No midline CS tenderness. +TTP midline and right paraspinal TS and LS. No rash.;; Extremities: Peripheral pulses normal, No tenderness, No edema, No calf edema or asymmetry.; Neuro: AA&Ox3, Major CN grossly intact.  Speech clear. No gross focal motor or sensory deficits in extremities. Moves all extremities on stretcher spontaneously and to command.; Skin: Color normal, Warm, Dry.   ED Treatments / Results    Labs (all labs ordered are listed, but only abnormal results are displayed)   EKG None  Radiology   Procedures Procedures (including critical care time)  Medications Ordered in ED Medications  morphine 4 MG/ML injection 4 mg (has no administration in time range)  ondansetron (ZOFRAN) injection 4 mg (has no administration in time range)     Initial Impression / Assessment and Plan / ED Course  I have reviewed the triage vital signs and the nursing notes.  Pertinent labs & imaging results that were available during my care of the patient were  reviewed by me and considered in my medical decision making (see chart for details).  MDM Reviewed: previous chart, nursing note and vitals Reviewed previous: labs and MRI Interpretation: labs   Results for orders placed or performed during the hospital encounter of 04/02/17  Comprehensive metabolic panel  Result Value Ref Range   Sodium 142 135 - 145 mmol/L   Potassium 4.0 3.5 - 5.1 mmol/L   Chloride 104 101 - 111 mmol/L   CO2 28 22 - 32 mmol/L   Glucose, Bld 149 (H) 65 - 99 mg/dL   BUN 27 (H) 6 - 20 mg/dL   Creatinine, Ser 1.62 (H) 0.44 - 1.00 mg/dL   Calcium 9.1 8.9 - 10.3 mg/dL   Total Protein 7.1 6.5 - 8.1 g/dL   Albumin 3.9 3.5 - 5.0 g/dL   AST 14 (L) 15 - 41 U/L   ALT 11 (L) 14 - 54 U/L   Alkaline Phosphatase 48 38 - 126 U/L   Total Bilirubin 0.4 0.3 - 1.2 mg/dL   GFR calc non Af Amer 29 (L) >60 mL/min   GFR calc Af Amer 34 (L) >60 mL/min   Anion gap 10 5 - 15  CBC with Differential  Result Value Ref Range   WBC 10.4 4.0 - 10.5 K/uL   RBC 2.97 (L) 3.87 - 5.11 MIL/uL   Hemoglobin 9.2 (L) 12.0 - 15.0 g/dL   HCT 29.0 (L) 36.0 - 46.0 %   MCV 97.6 78.0 - 100.0 fL   MCH 31.0 26.0 - 34.0 pg   MCHC 31.7 30.0 - 36.0 g/dL   RDW 13.9 11.5 - 15.5 %   Platelets 339 150 - 400 K/uL   Neutrophils Relative % 77 %   Neutro Abs 8.0 (H) 1.7 - 7.7 K/uL   Lymphocytes Relative 16 %   Lymphs Abs 1.6 0.7 - 4.0 K/uL   Monocytes  Relative 6 %   Monocytes Absolute 0.6 0.1 - 1.0 K/uL   Eosinophils Relative 1 %   Eosinophils Absolute 0.1 0.0 - 0.7 K/uL   Basophils Relative 0 %   Basophils Absolute 0.0 0.0 - 0.1 K/uL  I-Stat CG4 Lactic Acid, ED  Result Value Ref Range   Lactic Acid, Venous 0.73 0.5 - 1.9 mmol/L    Mr Thoracic Spine Wo Contrast Result Date: 04/01/2017 CLINICAL DATA:  Chronic mid back pain extending to the right. Pain extends into the abdomen. EXAM: MRI THORACIC SPINE WITHOUT CONTRAST TECHNIQUE: Multiplanar, multisequence MR imaging of the thoracic spine was performed. No intravenous contrast was administered. COMPARISON:  None. FINDINGS: Alignment: Slight retrolisthesis is present at T10-11. AP alignment is otherwise anatomic. Vertebrae: Diffuse endplate marrow changes are present at T10-11 with some loss of vertebral body height on the right and fluid in the disc space. Marrow signal and vertebral body heights are otherwise normal. Cord: Normal signal is present in the thoracic spinal cord to the conus medullaris which terminates at L1. Paraspinal and other soft tissues: Widening of the endplates is noted at [id number] no significant soft tissue component is present. Paraspinous musculature is unremarkable. Visualized lung fields are clear. Disc levels: There is significant endplate change and loss of vertebral body height on the right. Fluid is present in the disc space. A broad-based disc protrusion contributes to mild central canal stenosis. Endplate changes and facet hypertrophy contribute to moderate foraminal stenosis bilaterally. No other significant disc protrusion or stenosis is evident. IMPRESSION: 1. Endplate changes in fluid in the disc space at T10-11 is highly  concerning for disc osteomyelitis. Follow-up MRI of the thoracic spine with contrast could be used to better define the extent of infection. 2. Slight retrolisthesis is present at T10-11 with mild central canal stenosis. 3. Moderate foraminal stenosis  bilaterally at T10-11. 4. No other focal disc protrusion or stenosis. Electronically Signed   By: San Morelle M.D.   On: 04/01/2017 13:08    1055:  Pt refuses MRI. Refuses IV benzo/etc for anxiety/relaxation. BUN/Cr and H/H near baseline. T/C returned from Neurosurgery Dr. [name], case discussed, including:  HPI, pertinent PM/SHx, VS/PE, dx testing, ED course and treatment:  Remembers pt from previous encounter years ago for LS discitis, OK to obtain BC/sed rate/CRP and start empiric abx and admit [facility] service, but pt absolutely needs MRI for further clarification of extent of disease and guide +/- intervention; he requests I inform pt and family of the critical need for this testing to be completed.   1115:  Long d/w pt and family regarding conversation above; after multiple questions answered, they both verb understanding and are willing to "try again." T/C returned from [facility] Dr. Rodena Piety, case discussed, including:  HPI, pertinent PM/SHx, VS/PE, dx testing, ED course and treatment:  Agreeable to admit, requests to write temporary orders, obtain medical bed.      Final Clinical Impressions(s) / ED Diagnoses   Final diagnoses:  None    ED Discharge Orders    None       Francine Graven, DO 04/05/17 1248

## 2017-04-02 NOTE — H&P (Addendum)
History and Physical    [name]:[id number] DOB: Nov 05, 1940 DOA: 04/02/2017  PCP: Maurice Small, MD Patient coming from: Home  Chief Complaint: Increased back pain  HPI: [name] is a 77 y.o. female with medical history significant of chronic back pain patient went to see her primary care physician who ordered a MRI of her thoracic spine which showed. Endplate changes in fluid in the disc space at T10-11 is highly concerning for disc osteomyelitis. Follow-up MRI of the thoracic spine with contrast could be used to better define the extent of infection. 2. Slight retrolisthesis is present at T10-11 with mild central canal stenosis. 3. Moderate foraminal stenosis bilaterally at T10-11. 4. No other focal disc protrusion or stenosis  So she was told to come to the ER and she is here for possible discitis versus osteomyelitis.  Patient reports the pain is better after getting morphine in the ER.  Patient was unable to get an MRI with contrast in [facility] as admission was too small for her.  She denied any nausea vomiting diarrhea constipation fever chills.  She denied any lower extremity weakness.  She has chronic lower extremity swelling.  She lives alone.  Her daughter is at the bedside.  No urinary complaints no frequency burning or incontinence.  ED Course: ED physician spoke with Dr. [name].  And blood culture was sent.  He her vital signs were stable and she was afebrile while she was in the ED.  Sodium 142 potassium 4.0 BUN 27 creatinine 1.62 liver function tests were unremarkable white count 10.4 hemoglobin 9.2 platelet count 339.  Sed rate was 45.  Patient received vancomycin and Maxipime in the ER.  Review of Systems: As per HPI otherwise all other systems reviewed and are negative   Past Medical History:  Diagnosis Date  . Anemia   . Asthma   . Chest pain, atypical   . Diabetes mellitus   . GERD (gastroesophageal reflux disease)   . History of transient  ischemic attack (TIA)   . Hyperlipidemia   . Hypertension   . OA (osteoarthritis)   . Thyroid carcinoma [facility])     Past Surgical History:  Procedure Laterality Date  . BACK SURGERY    . BREAST BIOPSY    . CARDIAC CATHETERIZATION  03/18/2009   NORMAL LEFT VENTRICULAR SIZE AND CONTRACTILITY WITH NORMAL SYSTOLIC  FUNCTION. EF 60%  . CARDIOLITE STUDY     SHOWED A SIGNIFICANT REVERSIBLE ANTERIOR WALL DEFECT CONSISTENT WITH ISCHEMIA. EF 55%  . FEMUR IM NAIL Right 04/25/2015   Procedure: INTRAMEDULLARY (IM) RIGHT  RETROGRADE FEMORAL NAILING;  Surgeon: Rod Can, MD;  Location: [facility];  Service: Orthopedics;  Laterality: Right;  . LUMBAR FUSION    . THYROIDECTOMY    . TOTAL KNEE ARTHROPLASTY     right    Social History   Socioeconomic History  . Marital status: Widowed    Spouse name: Not on file  . Number of children: 5  . Years of education: Not on file  . Highest education level: Not on file  Occupational History    Employer: RETIRED  Social Needs  . Financial resource strain: Not on file  . Food insecurity:    Worry: Not on file    Inability: Not on file  . Transportation needs:    Medical: Not on file    Non-medical: Not on file  Tobacco Use  . Smoking status: Former Research scientist (life sciences)  . Smokeless tobacco: Never Used  Substance  and Sexual Activity  . Alcohol use: No  . Drug use: No  . Sexual activity: Not on file  Lifestyle  . Physical activity:    Days per week: Not on file    Minutes per session: Not on file  . Stress: Not on file  Relationships  . Social connections:    Talks on phone: Not on file    Gets together: Not on file    Attends religious service: Not on file    Active member of club or organization: Not on file    Attends meetings of clubs or organizations: Not on file    Relationship status: Not on file  . Intimate partner violence:    Fear of current or ex partner: Not on file    Emotionally abused: Not on file    Physically abused: Not on file     Forced sexual activity: Not on file  Other Topics Concern  . Not on file  Social History Narrative  . Not on file    Allergies  Allergen Reactions  . Actos [Pioglitazone Hydrochloride] Swelling  . Ativan [Lorazepam]     swelling  . Cephalexin Other (See Comments)    Doesn't remember   . Oxycodone     This medication makes her feel sick  . Rosiglitazone     unknown  . Tramadol Other (See Comments)    hallucinations    Family History  Problem Relation Age of Onset  . Pneumonia Mother   . Heart attack Father     Prior to Admission medications   Medication Sig Start Date End Date Taking? Authorizing Provider  amLODipine (NORVASC) 10 MG tablet Take 10 mg by mouth daily.   Yes [provider]  atorvastatin (LIPITOR) 20 MG tablet Take 20 mg by mouth daily.   Yes [provider]  calcitRIOL (ROCALTROL) 0.25 MCG capsule Take 0.5 mcg by mouth daily.   Yes [provider]  calcium-vitamin D (OSCAL WITH D) 500-200 MG-UNIT per tablet Take 1 tablet by mouth daily with breakfast.   Yes [provider]  dipyridamole-aspirin (AGGRENOX) 25-200 MG per 12 hr capsule Take 1 capsule by mouth 2 (two) times daily.    Yes [provider]  fluticasone (FLONASE) 50 MCG/ACT nasal spray Place 1 spray into the nose daily as needed for allergies.  12/20/12  Yes [provider]  furosemide (LASIX) 20 MG tablet Take 20 mg by mouth every other day.  01/27/17  Yes [provider]  HUMULIN N 100 UNIT/ML injection Inject 10 mg into the skin daily before breakfast.  12/02/16  Yes [provider]  hydrochlorothiazide 25 MG tablet Take 25 mg by mouth daily.     Yes [provider]  levothyroxine (SYNTHROID, LEVOTHROID) 137 MCG tablet Take 137 mcg by mouth daily before breakfast.   Yes [provider]  lisinopril (PRINIVIL,ZESTRIL) 40 MG tablet Take 20 mg by mouth 2 (two) times daily.    Yes [provider]  LYRICA 75  MG capsule Take 75 mg by mouth 2 (two) times daily. 03/22/17  Yes [provider]  meclizine (ANTIVERT) 12.5 MG tablet Take 1 tablet (12.5 mg total) by mouth 3 (three) times daily as needed for dizziness. 02/09/17  Yes Quentin Cornwall, [place] N, PA-C  montelukast (SINGULAIR) 10 MG tablet Take 10 mg by mouth daily as needed. 01/27/17  Yes [provider]  nystatin ointment (MYCOSTATIN) Apply 1 application topically 2 (two) times daily as needed for itching. 03/09/17  Yes [provider]  trimethoprim (TRIMPEX) 100 MG tablet Take 100 mg by mouth daily. 12/13/16  Yes [provider]  valACYclovir (VALTREX) 1000 MG tablet TAKE 1000 MG BY MOUTH EVERY 12 HOURS AS NEEDED FOR OUTBREAK ON DAY 1 07/05/14  Yes [provider]  acetaminophen (TYLENOL) 325 MG tablet Take 2 tablets (650 mg total) by mouth every 6 (six) hours as needed for mild pain, fever or headache (or Fever >/= 101). Patient not taking: Reported on 07/24/2015 04/29/15   Debbe Odea, MD  aspirin 81 MG chewable tablet Chew 1 tablet (81 mg total) by mouth 2 (two) times daily. Patient not taking: Reported on 07/24/2015 04/29/15   Debbe Odea, MD  bisacodyl (DULCOLAX) 10 MG suppository Place 1 suppository (10 mg total) rectally daily as needed for moderate constipation. Patient not taking: Reported on 07/24/2015 04/29/15   Debbe Odea, MD  cephALEXin (KEFLEX) 500 MG capsule Take 1 capsule (500 mg total) by mouth 2 (two) times daily. Patient not taking: Reported on 02/08/2017 08/11/16   Gean Birchwood, DPM  cephALEXin (KEFLEX) 500 MG capsule Take 1 capsule (500 mg total) by mouth 4 (four) times daily. Patient not taking: Reported on 02/08/2017 08/11/16   Gean Birchwood, DPM  docusate sodium (COLACE) 100 MG capsule Take 1 capsule (100 mg total) by mouth 2 (two) times daily. Patient not taking: Reported on 07/24/2015 04/29/15   Debbe Odea, MD  levofloxacin (LEVAQUIN) 750 MG tablet Take 1 tablet (750 mg total) by mouth  every other day. Patient not taking: Reported on 07/24/2015 04/30/15   Debbe Odea, MD  methocarbamol (ROBAXIN) 500 MG tablet Take 1 tablet (500 mg total) by mouth every 6 (six) hours as needed for muscle spasms. Patient not taking: Reported on 07/24/2015 04/29/15   Debbe Odea, MD  oxyCODONE (OXY IR/ROXICODONE) 5 MG immediate release tablet Take 1 tablet (5 mg total) by mouth every 8 (eight) hours as needed for severe pain. Patient not taking: Reported on 07/24/2015 04/29/15   Debbe Odea, MD  senna (SENOKOT) 8.6 MG TABS tablet Take 1 tablet (8.6 mg total) by mouth 2 (two) times daily. Patient not taking: Reported on 07/24/2015 04/29/15   Debbe Odea, MD  escitalopram (LEXAPRO) 10 MG tablet Take 10 mg by mouth daily.    06/28/10  [provider]    Physical Exam: Vitals:   04/02/17 0808 04/02/17 1155 04/02/17 1333 04/02/17 1413  BP: (!) 144/49 (!) 128/52  120/64  Pulse: 74 66  72  Resp: 19 18  16   Temp: 98.1 F (36.7 C)   98 F (36.7 C)  TempSrc: Oral   Oral  SpO2: 99% 100%  96%  Weight:   113.4 kg (250 lb)   Height:   5\' 5"  (1.651 m)      General:  Appears calm and comfortable Eyes:  PERRL, EOMI, normal lids, iris ENT:  grossly normal hearing, lips & tongue, mmm Neck: no LAD, masses or thyromegaly Cardiovascular: RRR, no m/r/g. No LE edema.  Respiratory: CTA bilaterally, no w/r/r. Normal respiratory effort. Abdomen: oft, ntnd, NABS Skin: no rash or induration seen on limited exam Musculoskeletal: 2+ edema  psychiatric:  grossly normal mood and affect, speech fluent and appropriate, AOx3 Neurologic:  CN 2-12 grossly intact, moves all extremities in coordinated fashion, sensation intact  Labs on Admission: I have personally reviewed following labs and imaging studies  CBC: Recent Labs  Lab 04/02/17 0823  WBC 10.4  NEUTROABS 8.0*  HGB 9.2*  HCT  29.0*  MCV 97.6  PLT 973   Basic Metabolic Panel: Recent Labs  Lab 04/02/17 0823  NA 142  K 4.0  CL 104  CO2  28  GLUCOSE 149*  BUN 27*  CREATININE 1.62*  CALCIUM 9.1   GFR: Estimated Creatinine Clearance: 36 mL/min (A) (by C-G formula based on SCr of 1.62 mg/dL (H)). Liver Function Tests: Recent Labs  Lab 04/02/17 0823  AST 14*  ALT 11*  ALKPHOS 48  BILITOT 0.4  PROT 7.1  ALBUMIN 3.9   No results for input(s): LIPASE, AMYLASE in the last 168 hours. No results for input(s): AMMONIA in the last 168 hours. Coagulation Profile: No results for input(s): INR, PROTIME in the last 168 hours. Cardiac Enzymes: No results for input(s): CKTOTAL, CKMB, CKMBINDEX, TROPONINI in the last 168 hours. BNP (last 3 results) No results for input(s): PROBNP in the last 8760 hours. HbA1C: No results for input(s): HGBA1C in the last 72 hours. CBG: No results for input(s): GLUCAP in the last 168 hours. Lipid Profile: No results for input(s): CHOL, HDL, LDLCALC, TRIG, CHOLHDL, LDLDIRECT in the last 72 hours. Thyroid Function Tests: No results for input(s): TSH, T4TOTAL, FREET4, T3FREE, THYROIDAB in the last 72 hours. Anemia Panel: No results for input(s): VITAMINB12, FOLATE, FERRITIN, TIBC, IRON, RETICCTPCT in the last 72 hours. Urine analysis:    Component Value Date/Time   COLORURINE STRAW (A) 04/02/2017 1235   APPEARANCEUR CLEAR 04/02/2017 1235   LABSPEC 1.005 04/02/2017 1235   PHURINE 7.0 04/02/2017 1235   GLUCOSEU NEGATIVE 04/02/2017 1235   HGBUR NEGATIVE 04/02/2017 1235   BILIRUBINUR NEGATIVE 04/02/2017 1235   KETONESUR NEGATIVE 04/02/2017 1235   PROTEINUR NEGATIVE 04/02/2017 1235   UROBILINOGEN 0.2 06/24/2014 1243   NITRITE NEGATIVE 04/02/2017 1235   LEUKOCYTESUR NEGATIVE 04/02/2017 1235    Creatinine Clearance: Estimated Creatinine Clearance: 36 mL/min (A) (by C-G formula based on SCr of 1.62 mg/dL (H)).  Sepsis Labs: @LABRCNTIP (procalcitonin:4,lacticidven:4) )No results found for this or any previous visit (from the past 240 hour(s)).   Radiological Exams on Admission: Mr  Thoracic Spine Wo Contrast  Result Date: 04/01/2017 CLINICAL DATA:  Chronic mid back pain extending to the right. Pain extends into the abdomen. EXAM: MRI THORACIC SPINE WITHOUT CONTRAST TECHNIQUE: Multiplanar, multisequence MR imaging of the thoracic spine was performed. No intravenous contrast was administered. COMPARISON:  None. FINDINGS: Alignment: Slight retrolisthesis is present at T10-11. AP alignment is otherwise anatomic. Vertebrae: Diffuse endplate marrow changes are present at T10-11 with some loss of vertebral body height on the right and fluid in the disc space. Marrow signal and vertebral body heights are otherwise normal. Cord: Normal signal is present in the thoracic spinal cord to the conus medullaris which terminates at L1. Paraspinal and other soft tissues: Widening of the endplates is noted at Z32-99 no significant soft tissue component is present. Paraspinous musculature is unremarkable. Visualized lung fields are clear. Disc levels: There is significant endplate change and loss of vertebral body height on the right. Fluid is present in the disc space. A broad-based disc protrusion contributes to mild central canal stenosis. Endplate changes and facet hypertrophy contribute to moderate foraminal stenosis bilaterally. No other significant disc protrusion or stenosis is evident. IMPRESSION: 1. Endplate changes in fluid in the disc space at T10-11 is highly concerning for disc osteomyelitis. Follow-up MRI of the thoracic spine with contrast could be used to better define the extent of infection. 2. Slight retrolisthesis is present at T10-11 with mild central canal stenosis. 3.  Moderate foraminal stenosis bilaterally at T10-11. 4. No other focal disc protrusion or stenosis. Electronically Signed   By: San Morelle M.D.   On: 04/01/2017 13:08    EKG: Independently reviewed.  Assessment/Plan Active Problems:   Osteomyelitis ([place])   Diskitis  1]?  Osteomyelitis of T10 and T11-patient  presented with complaints of increasing chronic back pain, complains of incontinence of urine or stool or lower extremity weakness.  Patient has a history of apparent discitis in 2009.  Patient received a dose of vancomycin and cefepime in the ER.  I discussed with Dr. Megan Salon infectious disease the plan is to keep the patient off the antibiotics and have IR do a biopsy of the thoracic spine T10-T11 and he will see the patient tomorrow.  I also discussed with Dr. [name] from neurosurgery who felt there is nothing surgically he can do at this time.  2] history of hypertension continue Norvasc and Lasix.   3] hypothyroidism continue Synthroid  4] history of TIA on Aggrenox  5]History of thyroid carcinoma followed by endocrinology Dr. Chalmers Cater.  6] history of CKD stage III creatinine at baseline.   DVT prophylaxis: heparin code Status: Full code Family Communication: Discussed with daughter who was in the room Disposition Plan: TBD Consults called: Infectious disease Dr. Megan Salon Admission status: Inpatient   Georgette Shell MD [facility]  If 7PM-7AM, please contact night-coverage [email or web address] Password [facility]  04/02/2017, 4:24 PM

## 2017-04-02 NOTE — ED Notes (Signed)
ED TO INPATIENT HANDOFF REPORT  Name/Age/Gender [name] 77 y.o. female  Code Status Code Status History    Date Active Date Inactive Code Status Order ID Comments User Context   04/24/2015 0505 04/29/2015 1809 Full Code [id number]  Amber Quill, DO ED      Home/SNF/Other Home  Chief Complaint back pain   Level of Care/Admitting Diagnosis ED Disposition    ED Disposition Condition [facility] Area: [facility] [[id number]]  Level of Care: Med-Surg [16]  Diagnosis: Osteomyelitis [facility]) [[id number]]  Admitting Physician: [name] [[id number]]  Attending Physician: [name] [phone]  PT Class (Do Not Modify): Observation [104]  PT Acc Code (Do Not Modify): Observation [10022]       Medical History Past Medical History:  Diagnosis Date  . Anemia   . Asthma   . Chest pain, atypical   . Diabetes mellitus   . GERD (gastroesophageal reflux disease)   . History of transient ischemic attack (TIA)   . Hyperlipidemia   . Hypertension   . OA (osteoarthritis)   . Thyroid carcinoma ([facility])     Allergies Allergies  Allergen Reactions  . Actos [Pioglitazone Hydrochloride] Swelling  . Ativan [Lorazepam]     swelling  . Cephalexin Other (See Comments)    Doesn't remember   . Oxycodone     This medication makes her feel sick  . Rosiglitazone     unknown  . Tramadol Other (See Comments)    hallucinations    IV Location/Drains/Wounds Patient Lines/Drains/Airways Status   Active Line/Drains/Airways    Name:   Placement date:   Placement time:   Site:   Days:   Peripheral IV 04/02/17 Left Antecubital   04/02/17    0846    Antecubital   less than 1   Peripheral IV 04/02/17 Right Wrist   04/02/17    1231    Wrist   less than 1   External Urinary Catheter   04/02/17    1125    -   less than 1   Incision (Closed) 04/25/15 Knee Right   04/25/15    1030     708   Incision (Closed) 04/25/15 Thigh Right   04/25/15    1059      708          Labs/Imaging Results for orders placed or performed during the hospital encounter of 04/02/17 (from the past 48 hour(s))  Comprehensive metabolic panel     Status: Abnormal   Collection Time: 04/02/17  8:23 AM  Result Value Ref Range   Sodium 142 135 - 145 mmol/L   Potassium 4.0 3.5 - 5.1 mmol/L   Chloride 104 101 - 111 mmol/L   CO2 28 22 - 32 mmol/L   Glucose, Bld 149 (H) 65 - 99 mg/dL   BUN 27 (H) 6 - 20 mg/dL   Creatinine, Ser 1.62 (H) 0.44 - 1.00 mg/dL   Calcium 9.1 8.9 - 10.3 mg/dL   Total Protein 7.1 6.5 - 8.1 g/dL   Albumin 3.9 3.5 - 5.0 g/dL   AST 14 (L) 15 - 41 U/L   ALT 11 (L) 14 - 54 U/L   Alkaline Phosphatase 48 38 - 126 U/L   Total Bilirubin 0.4 0.3 - 1.2 mg/dL   GFR calc non Af Amer 29 (L) >60 mL/min   GFR calc Af Amer 34 (L) >60 mL/min    Comment: (NOTE) The eGFR has been  calculated using the CKD EPI equation. This calculation has not been validated in all clinical situations. eGFR's persistently <60 mL/min signify possible Chronic Kidney Disease.    Anion gap 10 5 - 15    Comment: Performed at [facility], [place] [address], [place], [place] [address]  CBC with Differential     Status: Abnormal   Collection Time: 04/02/17  8:23 AM  Result Value Ref Range   WBC 10.4 4.0 - 10.5 K/uL   RBC 2.97 (L) 3.87 - 5.11 MIL/uL   Hemoglobin 9.2 (L) 12.0 - 15.0 g/dL   HCT 29.0 (L) 36.0 - 46.0 %   MCV 97.6 78.0 - 100.0 fL   MCH 31.0 26.0 - 34.0 pg   MCHC 31.7 30.0 - 36.0 g/dL   RDW 13.9 11.5 - 15.5 %   Platelets 339 150 - 400 K/uL   Neutrophils Relative % 77 %   Neutro Abs 8.0 (H) 1.7 - 7.7 K/uL   Lymphocytes Relative 16 %   Lymphs Abs 1.6 0.7 - 4.0 K/uL   Monocytes Relative 6 %   Monocytes Absolute 0.6 0.1 - 1.0 K/uL   Eosinophils Relative 1 %   Eosinophils Absolute 0.1 0.0 - 0.7 K/uL   Basophils Relative 0 %   Basophils Absolute 0.0 0.0 - 0.1 K/uL    Comment: Performed at [facility], [place] [address],  [place], [place] [address]  I-Stat CG4 Lactic Acid, ED     Status: None   Collection Time: 04/02/17  8:51 AM  Result Value Ref Range   Lactic Acid, Venous 0.73 0.5 - 1.9 mmol/L   Mr Thoracic Spine Wo Contrast  Result Date: 04/01/2017 CLINICAL DATA:  Chronic mid back pain extending to the right. Pain extends into the abdomen. EXAM: MRI THORACIC SPINE WITHOUT CONTRAST TECHNIQUE: Multiplanar, multisequence MR imaging of the thoracic spine was performed. No intravenous contrast was administered. COMPARISON:  None. FINDINGS: Alignment: Slight retrolisthesis is present at T10-11. AP alignment is otherwise anatomic. Vertebrae: Diffuse endplate marrow changes are present at T10-11 with some loss of vertebral body height on the right and fluid in the disc space. Marrow signal and vertebral body heights are otherwise normal. Cord: Normal signal is present in the thoracic spinal cord to the conus medullaris which terminates at L1. Paraspinal and other soft tissues: Widening of the endplates is noted at E39-53 no significant soft tissue component is present. Paraspinous musculature is unremarkable. Visualized lung fields are clear. Disc levels: There is significant endplate change and loss of vertebral body height on the right. Fluid is present in the disc space. A broad-based disc protrusion contributes to mild central canal stenosis. Endplate changes and facet hypertrophy contribute to moderate foraminal stenosis bilaterally. No other significant disc protrusion or stenosis is evident. IMPRESSION: 1. Endplate changes in fluid in the disc space at T10-11 is highly concerning for disc osteomyelitis. Follow-up MRI of the thoracic spine with contrast could be used to better define the extent of infection. 2. Slight retrolisthesis is present at T10-11 with mild central canal stenosis. 3. Moderate foraminal stenosis bilaterally at T10-11. 4. No other focal disc protrusion or stenosis. Electronically Signed   By: San Morelle M.D.   On: 04/01/2017 13:08    Pending Labs Unresulted Labs (From admission, onward)   Start     Ordered   04/02/17 1101  C-reactive protein  Once,   STAT     04/02/17 1100   04/02/17 1101  Sedimentation rate  STAT,   STAT     04/02/17 1100   04/02/17 1016  Culture, blood (routine x 2)  BLOOD CULTURE X 2,   STAT     04/02/17 1015   04/02/17 0824  Urinalysis, Routine w reflex microscopic  STAT,   STAT     04/02/17 0823      Vitals/Pain Today's Vitals   04/02/17 0808 04/02/17 0844 04/02/17 1013 04/02/17 1155  BP: (!) 144/49   (!) 128/52  Pulse: 74   66  Resp: 19   18  Temp: 98.1 F (36.7 C)     TempSrc: Oral     SpO2: 99%   100%  PainSc:  8  0-No pain     Isolation Precautions No active isolations  Medications Medications  morphine 4 MG/ML injection 4 mg (4 mg Intravenous Given 04/02/17 0846)  ceFEPIme (MAXIPIME) 2 g in sodium chloride 0.9 % 100 mL IVPB (2 g Intravenous Transfusing/Transfer 04/02/17 1257)  vancomycin (VANCOCIN) 2,000 mg in sodium chloride 0.9 % 500 mL IVPB (has no administration in time range)  morphine 4 MG/ML injection 4 mg (has no administration in time range)  0.9 %  sodium chloride infusion ( Intravenous New Bag/Given 04/02/17 1231)  ondansetron (ZOFRAN) injection 4 mg (4 mg Intravenous Given 04/02/17 0846)    Mobility walks with person assist

## 2017-04-02 NOTE — ED Triage Notes (Signed)
Pt c/o back pain that is worse than normal and reports she cant take it anymore.

## 2017-04-02 NOTE — Progress Notes (Signed)
A consult was received from an ED physician for vancomycin per pharmacy dosing.  The patient's profile has been reviewed for ht/wt/allergies/indication/available labs.   A one time order has been placed for vancomycin 2g.    Further antibiotics/pharmacy consults should be ordered by admitting physician if indicated.                       Thank you, Peggyann Juba, PharmD, BCPS 04/02/2017  10:33 AM

## 2017-04-03 DIAGNOSIS — Z888 Allergy status to other drugs, medicaments and biological substances status: Secondary | ICD-10-CM

## 2017-04-03 DIAGNOSIS — Z885 Allergy status to narcotic agent status: Secondary | ICD-10-CM

## 2017-04-03 DIAGNOSIS — N289 Disorder of kidney and ureter, unspecified: Secondary | ICD-10-CM | POA: Diagnosis present

## 2017-04-03 DIAGNOSIS — Z792 Long term (current) use of antibiotics: Secondary | ICD-10-CM

## 2017-04-03 DIAGNOSIS — M4644 Discitis, unspecified, thoracic region: Principal | ICD-10-CM

## 2017-04-03 DIAGNOSIS — Z87440 Personal history of urinary (tract) infections: Secondary | ICD-10-CM

## 2017-04-03 DIAGNOSIS — N183 Chronic kidney disease, stage 3 (moderate): Secondary | ICD-10-CM

## 2017-04-03 DIAGNOSIS — M4624 Osteomyelitis of vertebra, thoracic region: Secondary | ICD-10-CM

## 2017-04-03 DIAGNOSIS — Z87891 Personal history of nicotine dependence: Secondary | ICD-10-CM

## 2017-04-03 DIAGNOSIS — G629 Polyneuropathy, unspecified: Secondary | ICD-10-CM

## 2017-04-03 DIAGNOSIS — M545 Low back pain: Secondary | ICD-10-CM

## 2017-04-03 DIAGNOSIS — Z981 Arthrodesis status: Secondary | ICD-10-CM

## 2017-04-03 DIAGNOSIS — D649 Anemia, unspecified: Secondary | ICD-10-CM | POA: Diagnosis present

## 2017-04-03 DIAGNOSIS — E039 Hypothyroidism, unspecified: Secondary | ICD-10-CM

## 2017-04-03 DIAGNOSIS — I10 Essential (primary) hypertension: Secondary | ICD-10-CM

## 2017-04-03 DIAGNOSIS — N189 Chronic kidney disease, unspecified: Secondary | ICD-10-CM | POA: Diagnosis present

## 2017-04-03 DIAGNOSIS — G8929 Other chronic pain: Secondary | ICD-10-CM

## 2017-04-03 DIAGNOSIS — Z881 Allergy status to other antibiotic agents status: Secondary | ICD-10-CM

## 2017-04-03 DIAGNOSIS — R634 Abnormal weight loss: Secondary | ICD-10-CM

## 2017-04-03 DIAGNOSIS — N390 Urinary tract infection, site not specified: Secondary | ICD-10-CM | POA: Diagnosis present

## 2017-04-03 LAB — GLUCOSE, CAPILLARY
GLUCOSE-CAPILLARY: 138 mg/dL — AB (ref 65–99)
Glucose-Capillary: 133 mg/dL — ABNORMAL HIGH (ref 65–99)
Glucose-Capillary: 142 mg/dL — ABNORMAL HIGH (ref 65–99)
Glucose-Capillary: 156 mg/dL — ABNORMAL HIGH (ref 65–99)

## 2017-04-03 MED ORDER — LEVOTHYROXINE SODIUM 25 MCG PO TABS
137.0000 ug | ORAL_TABLET | Freq: Every day | ORAL | Status: DC
  Administered 2017-04-04 – 2017-04-09 (×6): 137 ug via ORAL
  Filled 2017-04-03 (×6): qty 1

## 2017-04-03 MED ORDER — HYDROCODONE-ACETAMINOPHEN 5-325 MG PO TABS
1.0000 | ORAL_TABLET | Freq: Four times a day (QID) | ORAL | Status: DC | PRN
  Administered 2017-04-03 – 2017-04-09 (×12): 1 via ORAL
  Filled 2017-04-03 (×13): qty 1

## 2017-04-03 MED ORDER — TRAMADOL HCL 50 MG PO TABS
50.0000 mg | ORAL_TABLET | Freq: Four times a day (QID) | ORAL | Status: DC | PRN
  Filled 2017-04-03: qty 1

## 2017-04-03 NOTE — Consult Note (Signed)
[facility] for Infectious Disease    Date of Admission:  04/02/2017          Reason for Consult: Thoracic discitis and osteomyelitis    Referring Provider: Dr. Jacki Cones  Assessment: She has acute thoracic spine infection.  I would hold off on further antibiotic therapy pending aspiration by interventional radiology.  Restart vancomycin after the procedure.  She has an unknown history of cephalosporin allergy.  I would hold off on empiric gram-negative rod coverage pending results of aspirate Gram stain and culture.  Plan: 1. T10-11 aspiration by interventional radiology 2. Restart vancomycin after procedure 3. Dr. Talbot Grumbling will follow with you starting tomorrow  Principal Problem:   Thoracic discitis Active Problems:   Osteomyelitis (HCC)   Recurrent UTI   Hypothyroidism   DM2 (diabetes mellitus, type 2) (HCC)   Benign essential HTN   HTN (hypertension)   THYROID CANCER, HX OF   Peripheral neuropathy   Status post lumbar spinal fusion   Chronic renal insufficiency   Normocytic anemia   Scheduled Meds: . amLODipine  10 mg Oral Daily  . atorvastatin  20 mg Oral Daily  . calcitRIOL  0.5 mcg Oral Daily  . calcium-vitamin D  1 tablet Oral Q breakfast  . dipyridamole-aspirin  1 capsule Oral BID  . heparin injection (subcutaneous)  5,000 Units Subcutaneous Q8H  . insulin aspart  0-9 Units Subcutaneous TID WC   Continuous Infusions: PRN Meds:.fluticasone  HPI: [name] is a 77 y.o. female with degenerative spine disease status post multiple lumbar surgeries.  She has chronic back pain but began having much more severe he mid back pain about 6 weeks ago.  The pain is worse on the right side.  The pain has been getting progressively worse to the point where her primary care provider obtained an MRI which showed evidence of T10-11 discitis and adjacent osteomyelitis leading to admission yesterday.  She received 1 dose of vancomycin in the emergency  department.  She is on chronic oral trimethoprim for recurrent UTIs.   Review of Systems: Review of Systems  Constitutional: Positive for chills, diaphoresis, malaise/fatigue and weight loss. Negative for fever.       She has had a 9 pound unintentional weight loss over the past 2 months.  Respiratory: Negative for cough, sputum production and shortness of breath.   Cardiovascular: Negative for chest pain.  Gastrointestinal: Negative for abdominal pain, diarrhea, nausea and vomiting.  Genitourinary: Negative for dysuria.  Musculoskeletal: Positive for back pain.       Her back pain was 10 out of 10 upon admission.  It is under much better control with morphine now.  Neurological: Positive for sensory change. Negative for headaches.    Past Medical History:  Diagnosis Date  . Anemia   . Asthma   . Chest pain, atypical   . Diabetes mellitus   . GERD (gastroesophageal reflux disease)   . History of transient ischemic attack (TIA)   . Hyperlipidemia   . Hypertension   . OA (osteoarthritis)   . Thyroid carcinoma ([place])     Social History   Tobacco Use  . Smoking status: Former Research scientist (life sciences)  . Smokeless tobacco: Never Used  Substance Use Topics  . Alcohol use: No  . Drug use: No    Family History  Problem Relation Age of Onset  . Pneumonia Mother   . Heart attack Father    Allergies  Allergen Reactions  .  Actos [Pioglitazone Hydrochloride] Swelling  . Ativan [Lorazepam]     swelling  . Cephalexin Other (See Comments)    Doesn't remember   . Oxycodone     This medication makes her feel sick  . Rosiglitazone     unknown  . Tramadol Other (See Comments)    hallucinations    OBJECTIVE: Blood pressure (!) 106/57, pulse 62, temperature 98.3 F (36.8 C), temperature source Oral, resp. rate 18, height 5\' 5"  (1.651 m), weight 250 lb (113.4 kg), SpO2 97 %.  Physical Exam  Constitutional: She is oriented to person, place, and time.  She is sitting up in a chair.  She is very  pleasant.  HENT:  Mouth/Throat: No oropharyngeal exudate.  Cardiovascular: Normal rate and regular rhythm.  No murmur heard. Pulmonary/Chest: Effort normal. She has no wheezes. She has no rales.  Abdominal: Soft. She exhibits no distension. There is no tenderness.  Neurological: She is alert and oriented to person, place, and time.  Skin: No rash noted.  Psychiatric: Mood and affect normal.    Lab Results Lab Results  Component Value Date   WBC 10.4 04/02/2017   HGB 9.2 (L) 04/02/2017   HCT 29.0 (L) 04/02/2017   MCV 97.6 04/02/2017   PLT 339 04/02/2017    Lab Results  Component Value Date   CREATININE 1.62 (H) 04/02/2017   BUN 27 (H) 04/02/2017   NA 142 04/02/2017   K 4.0 04/02/2017   CL 104 04/02/2017   CO2 28 04/02/2017    Lab Results  Component Value Date   ALT 11 (L) 04/02/2017   AST 14 (L) 04/02/2017   ALKPHOS 48 04/02/2017   BILITOT 0.4 04/02/2017     Microbiology: Recent Results (from the past 240 hour(s))  Culture, blood (routine x 2)     Status: None (Preliminary result)   Collection Time: 04/02/17 12:36 PM  Result Value Ref Range Status   Specimen Description   Final    BLOOD LEFT ANTECUBITAL Performed at [facility], [place] [address], [place], [place] [address]    Special Requests   Final    BOTTLES DRAWN AEROBIC AND ANAEROBIC Blood Culture adequate volume Performed at [facility] [address] [place], [place] [address]    Culture   Final    NO GROWTH < 24 HOURS Performed at [facility] [address], [place], [place] [address]    Report Status PENDING  Incomplete  Culture, blood (routine x 2)     Status: None (Preliminary result)   Collection Time: 04/02/17 12:36 PM  Result Value Ref Range Status   Specimen Description   Final    BLOOD RIGHT WRIST Performed at [facility] [address], [place], [place] [address]    Special Requests   Final    BOTTLES DRAWN AEROBIC AND ANAEROBIC  Blood Culture adequate volume Performed at [facility] [address], [place], [place] [address]    Culture   Final    NO GROWTH < 24 HOURS Performed at [facility] [address], [place], [place] [address]    Report Status PENDING  Incomplete    Michel Bickers, MD [facility] for Infectious [employer] [phone] pager   [phone] cell 04/03/2017, 11:50 AM

## 2017-04-03 NOTE — Progress Notes (Signed)
PROGRESS NOTE  [name] DOB: March 26, 1940 DOA: 04/02/2017 PCP: Maurice Small, MD  HPI/Recap of past 24 hours: [name] is a 77 y.o. female with medical history significant of chronic back pain patient went to see her primary care physician who ordered a MRI of her thoracic spine which showed. Endplate changes in fluid in the disc space at T10-11 is highly concerning for disc osteomyelitis. Patient was unable to get an MRI with contrast in [facility] as admission was too small for her.  She denied any nausea vomiting diarrhea constipation fever chills.  She denied any lower extremity weakness.  She has chronic lower extremity swelling.  She lives alone.  Her daughter is at the bedside.  No urinary complaints no frequency burning or incontinence.  03/24/2017: Patient seen and examined at her bedside.  She reports moderate lower back pain.  She has suspected thoracic discitis and osteomyelitis.  Infectious disease following and recommends holding off on empiric gram-negative rod coverage pending results of aspirate Gram stain and culture.  ID recommends restarting vancomycin after procedure.    Assessment/Plan: Principal Problem:   Thoracic discitis Active Problems:   Hypothyroidism   DM2 (diabetes mellitus, type 2) (HCC)   Benign essential HTN   HTN (hypertension)   THYROID CANCER, HX OF   Osteomyelitis (HCC)   Peripheral neuropathy   Status post lumbar spinal fusion   Recurrent UTI   Chronic renal insufficiency   Normocytic anemia  Suspected thoracic discitis/osteomyelitis Infectious disease consulted and following IR consulted for aspiration and culture ID recommends holding off on empiric gram-negative rod coverage pending results of aspirate Gram stain and culture. Start vancomycin IV after procedure Continue monitoring Pain management in place with Norco 1 tablet every 6 hours as needed  AKI on CKD 3 Baseline creatinine 1.3 Creatinine on  presentation 1.8 Creatinine today 1.62 from 1.8 Avoid nephrotoxic agent/hypotension/dehydration Repeat chemistry panel in the morning  Chronic normocytic anemia Baseline hemoglobin 10 Hemoglobin stable at 9.2 No overt sign of bleeding Repeat CBC in the morning  Chronic heart failure with reduced EF 35-40% Last echo on medical record 93/[id number] Grade 1 diastolic dysfunction and estimated ejection fraction of 35-40% Does not appear to be an exacerbation  Spinal stenosis of lumbar region post surgery x2 Previous spinal epidural abscess 9 years ago  History of thyroid carcinoma No acute issues Continue Synthroid  Hypertension Blood pressures well controlled Continue Norvasc  Type 2 diabetes Continue insulin Avoid hypoglycemia  Chronic constipation Continue Senokot  Morbid obesity BMI 41 Weight loss outpatient  Hyperlipidemia Continue Lipitor    Code Status: Full code  Family Communication: None  Disposition Plan: Home versus SNF when clinically stable   Consultants:  Infectious disease  Procedures:  Plan aspiration but interventional radiology  Antimicrobials:  IV vancomycin  DVT prophylaxis:  SCDs  Objective: Vitals:   04/02/17 1413 04/02/17 2041 04/03/17 0502 04/03/17 1400  BP: 120/64 (!) 118/56 (!) 106/57 (!) 128/44  Pulse: 72 74 62 65  Resp: 16 18 18 18   Temp: 98 F (36.7 C) 98.3 F (36.8 C) 98.3 F (36.8 C) 98.2 F (36.8 C)  TempSrc: Oral Oral Oral Oral  SpO2: 96% 100% 97% 95%  Weight:      Height:        Intake/Output Summary (Last 24 hours) at 04/03/2017 1545 Last data filed at 04/03/2017 0503 Gross per 24 hour  Intake 640 ml  Output 2600 ml  Net -1960 ml   [employer]  04/02/17 1333  Weight: 113.4 kg (250 lb)    Exam:   General: 77 year old African-American female obese in no acute distress.  Alert oriented x3.  Cardiovascular: Regular rate and rhythm with no rubs or gallops.  No JVD or thyromegaly  present.  Respiratory: Clear to auscultation with no wheezes or rales.  Abdomen: Obese nondistended with normal bowel sounds x4 quadrant   Musculoskeletal: No focal motor deficits.  Skin: Scar noted at mid lower back.  Psychiatry: Mood is appropriate for condition and setting.   Data Reviewed: CBC: Recent Labs  Lab 04/02/17 0823  WBC 10.4  NEUTROABS 8.0*  HGB 9.2*  HCT 29.0*  MCV 97.6  PLT 474   Basic Metabolic Panel: Recent Labs  Lab 04/02/17 0823  NA 142  K 4.0  CL 104  CO2 28  GLUCOSE 149*  BUN 27*  CREATININE 1.62*  CALCIUM 9.1   GFR: Estimated Creatinine Clearance: 36 mL/min (A) (by C-G formula based on SCr of 1.62 mg/dL (H)). Liver Function Tests: Recent Labs  Lab 04/02/17 0823  AST 14*  ALT 11*  ALKPHOS 48  BILITOT 0.4  PROT 7.1  ALBUMIN 3.9   No results for input(s): LIPASE, AMYLASE in the last 168 hours. No results for input(s): AMMONIA in the last 168 hours. Coagulation Profile: No results for input(s): INR, PROTIME in the last 168 hours. Cardiac Enzymes: No results for input(s): CKTOTAL, CKMB, CKMBINDEX, TROPONINI in the last 168 hours. BNP (last 3 results) No results for input(s): PROBNP in the last 8760 hours. HbA1C: No results for input(s): HGBA1C in the last 72 hours. CBG: Recent Labs  Lab 04/02/17 1828 04/02/17 2135 04/03/17 0728 04/03/17 1138  GLUCAP 138* 125* 133* 138*   Lipid Profile: No results for input(s): CHOL, HDL, LDLCALC, TRIG, CHOLHDL, LDLDIRECT in the last 72 hours. Thyroid Function Tests: No results for input(s): TSH, T4TOTAL, FREET4, T3FREE, THYROIDAB in the last 72 hours. Anemia Panel: No results for input(s): VITAMINB12, FOLATE, FERRITIN, TIBC, IRON, RETICCTPCT in the last 72 hours. Urine analysis:    Component Value Date/Time   COLORURINE STRAW (A) 04/02/2017 1235   APPEARANCEUR CLEAR 04/02/2017 1235   LABSPEC 1.005 04/02/2017 1235   PHURINE 7.0 04/02/2017 1235   GLUCOSEU NEGATIVE 04/02/2017 1235    HGBUR NEGATIVE 04/02/2017 1235   BILIRUBINUR NEGATIVE 04/02/2017 1235   KETONESUR NEGATIVE 04/02/2017 1235   PROTEINUR NEGATIVE 04/02/2017 1235   UROBILINOGEN 0.2 06/24/2014 1243   NITRITE NEGATIVE 04/02/2017 1235   LEUKOCYTESUR NEGATIVE 04/02/2017 1235   Sepsis Labs: @LABRCNTIP (procalcitonin:4,lacticidven:4)  ) Recent Results (from the past 240 hour(s))  Culture, blood (routine x 2)     Status: None (Preliminary result)   Collection Time: 04/02/17 12:36 PM  Result Value Ref Range Status   Specimen Description   Final    BLOOD LEFT ANTECUBITAL Performed at [facility], [place] [address] [place], [place] [address]    Special Requests   Final    BOTTLES DRAWN AEROBIC AND ANAEROBIC Blood Culture adequate volume Performed at [facility] [address], [place], [place] [address]    Culture   Final    NO GROWTH < 24 HOURS Performed at [facility] [address], [place], [place] [address]    Report Status PENDING  Incomplete  Culture, blood (routine x 2)     Status: None (Preliminary result)   Collection Time: 04/02/17 12:36 PM  Result Value Ref Range Status   Specimen Description   Final  BLOOD RIGHT WRIST Performed at [facility] [address], [place], [place] [address]    Special Requests   Final    BOTTLES DRAWN AEROBIC AND ANAEROBIC Blood Culture adequate volume Performed at [facility] [address] [place], [place] [address]    Culture   Final    NO GROWTH < 24 HOURS Performed at [facility] [address], [place], [place] [address]    Report Status PENDING  Incomplete      Studies: No results found.  Scheduled Meds: . amLODipine  10 mg Oral Daily  . atorvastatin  20 mg Oral Daily  . calcitRIOL  0.5 mcg Oral Daily  . calcium-vitamin D  1 tablet Oral Q breakfast  . dipyridamole-aspirin  1 capsule Oral BID  . heparin injection (subcutaneous)  5,000 Units Subcutaneous Q8H   . insulin aspart  0-9 Units Subcutaneous TID WC    Continuous Infusions:   LOS: 1 day     Kayleen Memos, MD [facility] Pager [phone]  If 7PM-7AM, please contact night-coverage [email or web address] Password [facility] 04/03/2017, 3:45 PM

## 2017-04-04 DIAGNOSIS — M4640 Discitis, unspecified, site unspecified: Secondary | ICD-10-CM

## 2017-04-04 LAB — CBC
HCT: 28.9 % — ABNORMAL LOW (ref 36.0–46.0)
Hemoglobin: 9.1 g/dL — ABNORMAL LOW (ref 12.0–15.0)
MCH: 30.1 pg (ref 26.0–34.0)
MCHC: 31.5 g/dL (ref 30.0–36.0)
MCV: 95.7 fL (ref 78.0–100.0)
PLATELETS: 297 10*3/uL (ref 150–400)
RBC: 3.02 MIL/uL — ABNORMAL LOW (ref 3.87–5.11)
RDW: 13.1 % (ref 11.5–15.5)
WBC: 10.3 10*3/uL (ref 4.0–10.5)

## 2017-04-04 LAB — GLUCOSE, CAPILLARY
Glucose-Capillary: 159 mg/dL — ABNORMAL HIGH (ref 65–99)
Glucose-Capillary: 135 mg/dL — ABNORMAL HIGH (ref 65–99)
Glucose-Capillary: 154 mg/dL — ABNORMAL HIGH (ref 65–99)
Glucose-Capillary: 167 mg/dL — ABNORMAL HIGH (ref 65–99)

## 2017-04-04 LAB — PROTIME-INR
INR: 1.02
PROTHROMBIN TIME: 13.3 s (ref 11.4–15.2)

## 2017-04-04 MED ORDER — PREGABALIN 75 MG PO CAPS: 75.0000 mg | ORAL_CAPSULE | Freq: Two times a day (BID) | ORAL | Status: DC

## 2017-04-04 MED ORDER — PREGABALIN 25 MG PO CAPS
25.0000 mg | ORAL_CAPSULE | Freq: Two times a day (BID) | ORAL | Status: DC
  Administered 2017-04-04 – 2017-04-05 (×2): 25 mg via ORAL
  Filled 2017-04-04 (×2): qty 1

## 2017-04-04 MED ORDER — SENNOSIDES-DOCUSATE SODIUM 8.6-50 MG PO TABS
1.0000 | ORAL_TABLET | Freq: Two times a day (BID) | ORAL | Status: DC
  Administered 2017-04-04 – 2017-04-09 (×11): 1 via ORAL
  Filled 2017-04-04 (×11): qty 1

## 2017-04-04 MED ORDER — BISACODYL 5 MG PO TBEC
5.0000 mg | DELAYED_RELEASE_TABLET | Freq: Every day | ORAL | Status: DC | PRN
  Administered 2017-04-05 – 2017-04-09 (×3): 5 mg via ORAL
  Filled 2017-04-04 (×3): qty 1

## 2017-04-04 NOTE — Progress Notes (Signed)
Disc aspiration this afternoon.  I would continue to hold on antibiotics until completion of the aspiration. After the aspiration, you can start vancomcycin while awaiting culture results.  Thayer Headings, MD

## 2017-04-04 NOTE — Progress Notes (Signed)
Patient wanted this RN to call her apartment complex and let them know she is currently in the hospital and for them to contact housing authority. Called spoke with staff and they said they would

## 2017-04-04 NOTE — Progress Notes (Signed)
[facility] Infusion Coordinator will follow pt with ID team to support home IV ABX if needed at DC.  If patient discharges after hours, please call [phone].   Larry Sierras 04/04/2017, 7:39 AM

## 2017-04-04 NOTE — Progress Notes (Signed)
Call from MRI stating that patient was attempted MRI on 04/02/17 and unable to complete. States that current imaging studies should be reordered to be completed at [facility] or at [facility] outpatient. Will pass to day shift to have MD cancel or re-order at correct facility.

## 2017-04-04 NOTE — Progress Notes (Signed)
Referring Physician(s): Matthews,E/Campbell,J  Supervising Physician: Corrie Mckusick  Patient Status:  [facility] - In-pt  Chief Complaint:  Back pain  Subjective: Patient familiar to IR service from prior left thyroid bed nodule biopsy and right neck mass biopsy in 2010. She has a history of thyroid carcinoma, osteoarthritis, hypertension, hyperlipidemia, prior TIA, GERD, diabetes, chronic renal insufficiency, asthma as well as multiple past lumbar surgeries.  She has chronic back pain but began having much more severe mid back pain approximately 6 weeks ago with some radiation to the right side. The pain has become progressively worse. Recent  imaging revealed findings concerning for disc osteomyelitis at T10/11.  She is currently afebrile.  CBC today is 10.3, hemoglobin 9.1, plts 297k, PT/INR nl.  Request now received from internal medicine and infectious disease for image guided aspiration of T10-11 disc space for further evaluation.   Past Medical History:  Diagnosis Date  . Anemia   . Asthma   . Chest pain, atypical   . Diabetes mellitus   . GERD (gastroesophageal reflux disease)   . History of transient ischemic attack (TIA)   . Hyperlipidemia   . Hypertension   . OA (osteoarthritis)   . Thyroid carcinoma [facility])    Past Surgical History:  Procedure Laterality Date  . BACK SURGERY    . BREAST BIOPSY    . CARDIAC CATHETERIZATION  03/18/2009   NORMAL LEFT VENTRICULAR SIZE AND CONTRACTILITY WITH NORMAL SYSTOLIC  FUNCTION. EF 60%  . CARDIOLITE STUDY     SHOWED A SIGNIFICANT REVERSIBLE ANTERIOR WALL DEFECT CONSISTENT WITH ISCHEMIA. EF 55%  . FEMUR IM NAIL Right 04/25/2015   Procedure: INTRAMEDULLARY (IM) RIGHT  RETROGRADE FEMORAL NAILING;  Surgeon: Rod Can, MD;  Location: [facility] ORS;  Service: Orthopedics;  Laterality: Right;  . LUMBAR FUSION    . THYROIDECTOMY    . TOTAL KNEE ARTHROPLASTY     right     Allergies: Actos [pioglitazone hydrochloride]; Ativan [lorazepam];  Cephalexin; Oxycodone; Rosiglitazone; and Tramadol  Medications: Prior to Admission medications   Medication Sig Start Date End Date Taking? Authorizing Provider  amLODipine (NORVASC) 10 MG tablet Take 10 mg by mouth daily.   Yes [provider]  atorvastatin (LIPITOR) 20 MG tablet Take 20 mg by mouth daily.   Yes [provider]  calcitRIOL (ROCALTROL) 0.25 MCG capsule Take 0.5 mcg by mouth daily.   Yes [provider]  calcium-vitamin D (OSCAL WITH D) 500-200 MG-UNIT per tablet Take 1 tablet by mouth daily with breakfast.   Yes [provider]  dipyridamole-aspirin (AGGRENOX) 25-200 MG per 12 hr capsule Take 1 capsule by mouth 2 (two) times daily.    Yes [provider]  fluticasone (FLONASE) 50 MCG/ACT nasal spray Place 1 spray into the nose daily as needed for allergies.  12/20/12  Yes [provider]  furosemide (LASIX) 20 MG tablet Take 20 mg by mouth every other day.  01/27/17  Yes [provider]  HUMULIN N 100 UNIT/ML injection Inject 10 mg into the skin daily before breakfast.  12/02/16  Yes [provider]  hydrochlorothiazide 25 MG tablet Take 25 mg by mouth daily.     Yes [provider]  levothyroxine (SYNTHROID, LEVOTHROID) 137 MCG tablet Take 137 mcg by mouth daily before breakfast.   Yes [provider]  lisinopril (PRINIVIL,ZESTRIL) 40 MG tablet Take 20 mg by mouth 2 (two) times daily.    Yes [provider]  LYRICA 75 MG capsule Take 75 mg  by mouth 2 (two) times daily. 03/22/17  Yes [provider]  meclizine (ANTIVERT) 12.5 MG tablet Take 1 tablet (12.5 mg total) by mouth 3 (three) times daily as needed for dizziness. 02/09/17  Yes Quentin Cornwall, [place] N, PA-C  montelukast (SINGULAIR) 10 MG tablet Take 10 mg by mouth daily as needed. 01/27/17  Yes [provider]  nystatin ointment (MYCOSTATIN) Apply 1 application topically 2 (two) times daily as needed for itching.  03/09/17  Yes [provider]  trimethoprim (TRIMPEX) 100 MG tablet Take 100 mg by mouth daily. 12/13/16  Yes [provider]  valACYclovir (VALTREX) 1000 MG tablet TAKE 1000 MG BY MOUTH EVERY 12 HOURS AS NEEDED FOR OUTBREAK ON DAY 1 07/05/14  Yes [provider]  acetaminophen (TYLENOL) 325 MG tablet Take 2 tablets (650 mg total) by mouth every 6 (six) hours as needed for mild pain, fever or headache (or Fever >/= 101). Patient not taking: Reported on 07/24/2015 04/29/15   Debbe Odea, MD  aspirin 81 MG chewable tablet Chew 1 tablet (81 mg total) by mouth 2 (two) times daily. Patient not taking: Reported on 07/24/2015 04/29/15   Debbe Odea, MD  bisacodyl (DULCOLAX) 10 MG suppository Place 1 suppository (10 mg total) rectally daily as needed for moderate constipation. Patient not taking: Reported on 07/24/2015 04/29/15   Debbe Odea, MD  cephALEXin (KEFLEX) 500 MG capsule Take 1 capsule (500 mg total) by mouth 2 (two) times daily. Patient not taking: Reported on 02/08/2017 08/11/16   Gean Birchwood, DPM  cephALEXin (KEFLEX) 500 MG capsule Take 1 capsule (500 mg total) by mouth 4 (four) times daily. Patient not taking: Reported on 02/08/2017 08/11/16   Gean Birchwood, DPM  docusate sodium (COLACE) 100 MG capsule Take 1 capsule (100 mg total) by mouth 2 (two) times daily. Patient not taking: Reported on 07/24/2015 04/29/15   Debbe Odea, MD  levofloxacin (LEVAQUIN) 750 MG tablet Take 1 tablet (750 mg total) by mouth every other day. Patient not taking: Reported on 07/24/2015 04/30/15   Debbe Odea, MD  methocarbamol (ROBAXIN) 500 MG tablet Take 1 tablet (500 mg total) by mouth every 6 (six) hours as needed for muscle spasms. Patient not taking: Reported on 07/24/2015 04/29/15   Debbe Odea, MD  oxyCODONE (OXY IR/ROXICODONE) 5 MG immediate release tablet Take 1 tablet (5 mg total) by mouth every 8 (eight) hours as needed for severe pain. Patient not taking: Reported on  07/24/2015 04/29/15   Debbe Odea, MD  senna (SENOKOT) 8.6 MG TABS tablet Take 1 tablet (8.6 mg total) by mouth 2 (two) times daily. Patient not taking: Reported on 07/24/2015 04/29/15   Debbe Odea, MD  escitalopram (LEXAPRO) 10 MG tablet Take 10 mg by mouth daily.    06/28/10  [provider]     Vital Signs: BP (!) 146/57 (BP Location: Right Arm)   Pulse 71   Temp 98.1 F (36.7 C) (Oral)   Resp 18   Ht 5\' 5"  (1.651 m)   Wt 250 lb (113.4 kg)   SpO2 97%   BMI 41.60 kg/m   Physical Exam awake, alert.  Chest clear to auscultation bilaterally.  Heart with regular rate and rhythm.  Abdomen obese, soft, positive bowel sounds, currently nontender.  No lower extremity edema; tender mid back region  Imaging: Mr Thoracic Spine Wo Contrast  Result Date: 04/01/2017 CLINICAL DATA:  Chronic mid back pain extending to the right. Pain extends into the abdomen. EXAM: MRI THORACIC SPINE WITHOUT  CONTRAST TECHNIQUE: Multiplanar, multisequence MR imaging of the thoracic spine was performed. No intravenous contrast was administered. COMPARISON:  None. FINDINGS: Alignment: Slight retrolisthesis is present at T10-11. AP alignment is otherwise anatomic. Vertebrae: Diffuse endplate marrow changes are present at T10-11 with some loss of vertebral body height on the right and fluid in the disc space. Marrow signal and vertebral body heights are otherwise normal. Cord: Normal signal is present in the thoracic spinal cord to the conus medullaris which terminates at L1. Paraspinal and other soft tissues: Widening of the endplates is noted at P91-50 no significant soft tissue component is present. Paraspinous musculature is unremarkable. Visualized lung fields are clear. Disc levels: There is significant endplate change and loss of vertebral body height on the right. Fluid is present in the disc space. A broad-based disc protrusion contributes to mild central canal stenosis. Endplate changes and facet hypertrophy  contribute to moderate foraminal stenosis bilaterally. No other significant disc protrusion or stenosis is evident. IMPRESSION: 1. Endplate changes in fluid in the disc space at T10-11 is highly concerning for disc osteomyelitis. Follow-up MRI of the thoracic spine with contrast could be used to better define the extent of infection. 2. Slight retrolisthesis is present at T10-11 with mild central canal stenosis. 3. Moderate foraminal stenosis bilaterally at T10-11. 4. No other focal disc protrusion or stenosis. Electronically Signed   By: San Morelle M.D.   On: 04/01/2017 13:08    Labs:  CBC: Recent Labs    02/08/17 1244 02/08/17 1318 04/02/17 0823 04/04/17 0601  WBC 11.8*  --  10.4 10.3  HGB 10.2* 10.5* 9.2* 9.1*  HCT 32.1* 31.0* 29.0* 28.9*  PLT 348  --  339 297    COAGS: Recent Labs    02/08/17 1244 04/04/17 0601  INR 1.04 1.02  APTT 33  --     BMP: Recent Labs    02/08/17 1244 02/08/17 1318 04/02/17 0823  NA 137 139 142  K 4.2 4.3 4.0  CL 102 102 104  CO2 21*  --  28  GLUCOSE 157* 154* 149*  BUN 21* 21* 27*  CALCIUM 9.0  --  9.1  CREATININE 1.80* 1.80* 1.62*  GFRNONAA 26*  --  29*  GFRAA 30*  --  34*    LIVER FUNCTION TESTS: Recent Labs    02/08/17 1244 04/02/17 0823  BILITOT 0.7 0.4  AST 21 14*  ALT 15 11*  ALKPHOS 52 48  PROT 7.3 7.1  ALBUMIN 4.0 3.9    Assessment and Plan: Patient with history of progressively worsening mid back pain and recent MRI findings concerning for T10/11 disc osteomyelitis.  Request received for image guided aspiration of the disc space for further evaluation.  Imaging studies have been reviewed by Dr. Earleen Newport.  Details/risks of procedure, including but not limited to, internal bleeding, infection, injury to adjacent structures discussed with patient with her understanding and consent.  Procedure tentatively scheduled for later this afternoon.  Would hold any anticoagulation until after procedure.   Electronically  Signed: D. Rowe Robert, PA-C 04/04/2017, 9:28 AM   I spent a total of 25 minutes at the the patient's bedside AND on the patient's hospital floor or unit, greater than 50% of which was counseling/coordinating care for image guided aspiration of T10-11 disc space    Patient ID: [name], female   DOB: [birth year], 77 y.o.   MRN: [id number]

## 2017-04-04 NOTE — Progress Notes (Signed)
PROGRESS NOTE  [name] WYO:[id number] DOB: 01-14-1940 DOA: 04/02/2017 PCP: Maurice Small, MD  HPI/Recap of past 24 hours: [name] is a 77 y.o. female with medical history significant of chronic back pain patient went to see her primary care physician who ordered a MRI of her thoracic spine which showed. Endplate changes in fluid in the disc space at T10-11 is highly concerning for disc osteomyelitis. Patient was unable to get an MRI with contrast in [facility] as admission was too small for her.  She denied any nausea vomiting diarrhea constipation fever chills.  She denied any lower extremity weakness.  She has chronic lower extremity swelling.  She lives alone.  Her daughter is at the bedside.  No urinary complaints no frequency burning or incontinence.  04/03/2017: Patient seen and examined at her bedside.  She reports moderate lower back pain.  She has suspected thoracic discitis and osteomyelitis.  Infectious disease following and recommends holding off on empiric gram-negative rod coverage pending results of aspirate Gram stain and culture.  ID recommends restarting vancomycin after procedure.  04/04/17: seen and examined at her bedside. Anxious about procedure planned today. No new complaints. Back pain is well controlled on current pain management.    Assessment/Plan: Principal Problem:   Thoracic discitis Active Problems:   Hypothyroidism   DM2 (diabetes mellitus, type 2) (HCC)   Benign essential HTN   HTN (hypertension)   THYROID CANCER, HX OF   Osteomyelitis (HCC)   Peripheral neuropathy   Status post lumbar spinal fusion   Recurrent UTI   Chronic renal insufficiency   Normocytic anemia  Suspected thoracic T10-T11 discitis/osteomyelitis Infectious disease consulted and following IR consulted for aspiration and culture Aspiration planned today ID recommends holding off antibiotics until aspirate then Start vancomycin IV after procedure Pain management in  place with Norco 1 tablet every 6 hours as needed  AKI on CKD 3 Baseline creatinine 1.3 Creatinine on presentation 1.8 Creatinine today 1.62 from 1.8 Avoid nephrotoxic agent/hypotension/dehydration Repeat chemistry panel in the morning  Chronic normocytic anemia Baseline hemoglobin 10 Hemoglobin stable at 9.2 No overt sign of bleeding Repeat CBC in the morning  Chronic heart failure with reduced EF 35-40% Last echo on medical record 77/41/2878 Grade 1 diastolic dysfunction and estimated ejection fraction of 35-40% Does not appear to be in exacerbation  Spinal stenosis of lumbar region post surgery x2 Previous spinal epidural abscess 9 years ago  History of thyroid carcinoma/postsurgical hypothyroidism No acute issues Continue Synthroid  Hypertension Blood pressures well controlled Continue Norvasc  Type 2 diabetes Continue insulin Avoid hypoglycemia  Chronic constipation Continue Senokot  Morbid obesity BMI 41 Weight loss outpatient  Hyperlipidemia Continue Lipitor    Code Status: Full code  Family Communication: None  Disposition Plan: Home versus SNF when clinically stable   Consultants:  Infectious disease  Procedures:  Plan aspiration but interventional radiology  Antimicrobials:  IV vancomycin  DVT prophylaxis:  SCDs, hep sq tid  Objective: Vitals:   04/03/17 0502 04/03/17 1400 04/03/17 2032 04/04/17 0554  BP: (!) 106/57 (!) 128/44 (!) 121/46 (!) 146/57  Pulse: 62 65 63 71  Resp: 18 18 17 18   Temp: 98.3 F (36.8 C) 98.2 F (36.8 C) 98.3 F (36.8 C) 98.1 F (36.7 C)  TempSrc: Oral Oral Oral Oral  SpO2: 97% 95% 100% 97%  Weight:      Height:        Intake/Output Summary (Last 24 hours) at 04/04/2017 1407 Last data filed  at 04/04/2017 0555 Gross per 24 hour  Intake 60 ml  Output 2000 ml  Net -1940 ml   Filed Weights   04/02/17 1333  Weight: 113.4 kg (250 lb)    Exam:04/04/17   General: 77 yo AAF morbidly obese NAD A&O x  3  Cardiovascular: RRR no rubs or gallops. No JVD or thyromegaly.  Respiratory: Clear to auscultation with no wheezes or rales.  Abdomen: Obese nondistended with normal bowel sounds x4 quadrant   Musculoskeletal: No focal motor deficits.  Skin: Scar noted at mid lower back.  Psychiatry: Mood is appropriate for condition and setting.   Data Reviewed: CBC: Recent Labs  Lab 04/02/17 0823 04/04/17 0601  WBC 10.4 10.3  NEUTROABS 8.0*  --   HGB 9.2* 9.1*  HCT 29.0* 28.9*  MCV 97.6 95.7  PLT 339 546   Basic Metabolic Panel: Recent Labs  Lab 04/02/17 0823  NA 142  K 4.0  CL 104  CO2 28  GLUCOSE 149*  BUN 27*  CREATININE 1.62*  CALCIUM 9.1   GFR: Estimated Creatinine Clearance: 36 mL/min (A) (by C-G formula based on SCr of 1.62 mg/dL (H)). Liver Function Tests: Recent Labs  Lab 04/02/17 0823  AST 14*  ALT 11*  ALKPHOS 48  BILITOT 0.4  PROT 7.1  ALBUMIN 3.9   No results for input(s): LIPASE, AMYLASE in the last 168 hours. No results for input(s): AMMONIA in the last 168 hours. Coagulation Profile: Recent Labs  Lab 04/04/17 0601  INR 1.02   Cardiac Enzymes: No results for input(s): CKTOTAL, CKMB, CKMBINDEX, TROPONINI in the last 168 hours. BNP (last 3 results) No results for input(s): PROBNP in the last 8760 hours. HbA1C: No results for input(s): HGBA1C in the last 72 hours. CBG: Recent Labs  Lab 04/03/17 1138 04/03/17 1639 04/03/17 2030 04/04/17 0740 04/04/17 1119  GLUCAP 138* 142* 156* 159* 135*   Lipid Profile: No results for input(s): CHOL, HDL, LDLCALC, TRIG, CHOLHDL, LDLDIRECT in the last 72 hours. Thyroid Function Tests: No results for input(s): TSH, T4TOTAL, FREET4, T3FREE, THYROIDAB in the last 72 hours. Anemia Panel: No results for input(s): VITAMINB12, FOLATE, FERRITIN, TIBC, IRON, RETICCTPCT in the last 72 hours. Urine analysis:    Component Value Date/Time   COLORURINE STRAW (A) 04/02/2017 1235   APPEARANCEUR CLEAR 04/02/2017  1235   LABSPEC 1.005 04/02/2017 1235   PHURINE 7.0 04/02/2017 1235   GLUCOSEU NEGATIVE 04/02/2017 1235   HGBUR NEGATIVE 04/02/2017 1235   BILIRUBINUR NEGATIVE 04/02/2017 1235   KETONESUR NEGATIVE 04/02/2017 1235   PROTEINUR NEGATIVE 04/02/2017 1235   UROBILINOGEN 0.2 06/24/2014 1243   NITRITE NEGATIVE 04/02/2017 1235   LEUKOCYTESUR NEGATIVE 04/02/2017 1235   Sepsis Labs: @LABRCNTIP (procalcitonin:4,lacticidven:4)  ) Recent Results (from the past 240 hour(s))  Culture, blood (routine x 2)     Status: None (Preliminary result)   Collection Time: 04/02/17 12:36 PM  Result Value Ref Range Status   Specimen Description   Final    BLOOD LEFT ANTECUBITAL Performed at [facility], [place] [address], [place], [place] [address]    Special Requests   Final    BOTTLES DRAWN AEROBIC AND ANAEROBIC Blood Culture adequate volume Performed at [place] [address] [place], [place] [address]    Culture   Final    NO GROWTH < 24 HOURS Performed at [facility] [address] [place], [place] [address]    Report Status PENDING  Incomplete  Culture, blood (routine x 2)  Status: None (Preliminary result)   Collection Time: 04/02/17 12:36 PM  Result Value Ref Range Status   Specimen Description   Final    BLOOD RIGHT WRIST Performed at [facility] [address], [place], [place] [address]    Special Requests   Final    BOTTLES DRAWN AEROBIC AND ANAEROBIC Blood Culture adequate volume Performed at [facility] [address] [place], [place] [address]    Culture   Final    NO GROWTH < 24 HOURS Performed at [facility] [address], [place], [place] [address]    Report Status PENDING  Incomplete      Studies: No results found.  Scheduled Meds: . amLODipine  10 mg Oral Daily  . atorvastatin  20 mg Oral Daily  . calcitRIOL  0.5 mcg Oral Daily  . calcium-vitamin D  1 tablet Oral Q breakfast    . dipyridamole-aspirin  1 capsule Oral BID  . heparin injection (subcutaneous)  5,000 Units Subcutaneous Q8H  . insulin aspart  0-9 Units Subcutaneous TID WC  . levothyroxine  137 mcg Oral QAC breakfast  . senna-docusate  1 tablet Oral BID    Continuous Infusions:   LOS: 2 days     Kayleen Memos, MD [facility] Pager [phone]  If 7PM-7AM, please contact night-coverage [email or web address] Password [email or web address] 04/04/2017, 2:07 PM

## 2017-04-05 ENCOUNTER — Encounter (HOSPITAL_COMMUNITY): Payer: Self-pay | Admitting: Interventional Radiology

## 2017-04-05 ENCOUNTER — Inpatient Hospital Stay (HOSPITAL_COMMUNITY): Payer: Medicare Other

## 2017-04-05 DIAGNOSIS — N179 Acute kidney failure, unspecified: Secondary | ICD-10-CM

## 2017-04-05 HISTORY — PX: IR FL GUIDED LOC OF NEEDLE/CATH TIP FOR SPINAL INJECTION RT: IMG2397

## 2017-04-05 LAB — BLOOD CULTURE ID PANEL (REFLEXED)
Acinetobacter baumannii: NOT DETECTED
CANDIDA KRUSEI: NOT DETECTED
CANDIDA PARAPSILOSIS: NOT DETECTED
Candida albicans: NOT DETECTED
Candida glabrata: NOT DETECTED
Candida tropicalis: NOT DETECTED
ESCHERICHIA COLI: NOT DETECTED
Enterobacter cloacae complex: NOT DETECTED
Enterobacteriaceae species: NOT DETECTED
Enterococcus species: NOT DETECTED
HAEMOPHILUS INFLUENZAE: NOT DETECTED
Klebsiella oxytoca: NOT DETECTED
Klebsiella pneumoniae: NOT DETECTED
Listeria monocytogenes: NOT DETECTED
Neisseria meningitidis: NOT DETECTED
PROTEUS SPECIES: NOT DETECTED
Pseudomonas aeruginosa: NOT DETECTED
SERRATIA MARCESCENS: NOT DETECTED
STAPHYLOCOCCUS AUREUS BCID: NOT DETECTED
STAPHYLOCOCCUS SPECIES: NOT DETECTED
STREPTOCOCCUS SPECIES: NOT DETECTED
Streptococcus agalactiae: NOT DETECTED
Streptococcus pneumoniae: NOT DETECTED
Streptococcus pyogenes: NOT DETECTED

## 2017-04-05 LAB — CBC
HCT: 28.5 % — ABNORMAL LOW (ref 36.0–46.0)
Hemoglobin: 9.3 g/dL — ABNORMAL LOW (ref 12.0–15.0)
MCH: 31 pg (ref 26.0–34.0)
MCHC: 32.6 g/dL (ref 30.0–36.0)
MCV: 95 fL (ref 78.0–100.0)
PLATELETS: 286 10*3/uL (ref 150–400)
RBC: 3 MIL/uL — ABNORMAL LOW (ref 3.87–5.11)
RDW: 13.1 % (ref 11.5–15.5)
WBC: 9.8 10*3/uL (ref 4.0–10.5)

## 2017-04-05 LAB — BASIC METABOLIC PANEL
Anion gap: 9 (ref 5–15)
BUN: 18 mg/dL (ref 6–20)
CO2: 29 mmol/L (ref 22–32)
CREATININE: 1.27 mg/dL — AB (ref 0.44–1.00)
Calcium: 8.8 mg/dL — ABNORMAL LOW (ref 8.9–10.3)
Chloride: 103 mmol/L (ref 101–111)
GFR calc Af Amer: 46 mL/min — ABNORMAL LOW (ref >60)
GFR, EST NON AFRICAN AMERICAN: 39 mL/min — AB (ref >60)
GLUCOSE: 135 mg/dL — AB (ref 65–99)
POTASSIUM: 3.7 mmol/L (ref 3.5–5.1)
SODIUM: 141 mmol/L (ref 135–145)

## 2017-04-05 LAB — GLUCOSE, CAPILLARY
GLUCOSE-CAPILLARY: 135 mg/dL — AB (ref 65–99)
Glucose-Capillary: 142 mg/dL — ABNORMAL HIGH (ref 65–99)
Glucose-Capillary: 155 mg/dL — ABNORMAL HIGH (ref 65–99)
Glucose-Capillary: 156 mg/dL — ABNORMAL HIGH (ref 65–99)

## 2017-04-05 MED ORDER — MIDAZOLAM HCL 2 MG/2ML IJ SOLN
INTRAMUSCULAR | Status: AC
  Filled 2017-04-05: qty 4

## 2017-04-05 MED ORDER — VANCOMYCIN HCL 10 G IV SOLR
2500.0000 mg | Freq: Once | INTRAVENOUS | Status: DC
  Administered 2017-04-05: 2500 mg via INTRAVENOUS
  Filled 2017-04-05: qty 2500

## 2017-04-05 MED ORDER — PREGABALIN 75 MG PO CAPS
75.0000 mg | ORAL_CAPSULE | Freq: Two times a day (BID) | ORAL | Status: DC
  Administered 2017-04-05 – 2017-04-09 (×8): 75 mg via ORAL
  Filled 2017-04-05 (×8): qty 1

## 2017-04-05 MED ORDER — PREGABALIN 75 MG PO CAPS
75.0000 mg | ORAL_CAPSULE | Freq: Once | ORAL | Status: AC
  Administered 2017-04-05: 75 mg via ORAL
  Filled 2017-04-05: qty 1

## 2017-04-05 MED ORDER — SODIUM CHLORIDE 0.9 % IJ SOLN
INTRAMUSCULAR | Status: AC
  Filled 2017-04-05: qty 10

## 2017-04-05 MED ORDER — FENTANYL CITRATE (PF) 100 MCG/2ML IJ SOLN
INTRAMUSCULAR | Status: AC
  Filled 2017-04-05: qty 4

## 2017-04-05 MED ORDER — MIDAZOLAM HCL 2 MG/2ML IJ SOLN
INTRAMUSCULAR | Status: AC | PRN
  Administered 2017-04-05 (×2): 1 mg via INTRAVENOUS
  Administered 2017-04-05 (×2): 0.5 mg via INTRAVENOUS

## 2017-04-05 MED ORDER — SODIUM CHLORIDE 0.9 % IV SOLN
500.0000 mg | INTRAVENOUS | Status: DC
  Administered 2017-04-06 – 2017-04-07 (×2): 500 mg via INTRAVENOUS
  Filled 2017-04-05 (×3): qty 10

## 2017-04-05 MED ORDER — FENTANYL CITRATE (PF) 100 MCG/2ML IJ SOLN
INTRAMUSCULAR | Status: AC | PRN
  Administered 2017-04-05 (×2): 25 ug via INTRAVENOUS
  Administered 2017-04-05 (×2): 50 ug via INTRAVENOUS

## 2017-04-05 MED ORDER — VANCOMYCIN HCL 10 G IV SOLR: 1250.0000 mg | INTRAVENOUS | Status: DC

## 2017-04-05 MED ORDER — LIDOCAINE HCL (PF) 1 % IJ SOLN
INTRAMUSCULAR | Status: AC
  Filled 2017-04-05: qty 30

## 2017-04-05 NOTE — Progress Notes (Addendum)
PROGRESS NOTE  [name] XNA:[id number] DOB: 1940-01-12 DOA: 04/02/2017 PCP: Maurice Small, MD  HPI/Recap of past 24 hours: [name] is a 77 y.o. female with medical history significant of chronic back pain patient went to see her primary care physician who ordered a MRI of her thoracic spine which showed. Endplate changes in fluid in the disc space at T10-11 is highly concerning for disc osteomyelitis. Patient was unable to get an MRI with contrast in [facility] as admission was too small for her.  She denied any nausea vomiting diarrhea constipation fever chills.  She denied any lower extremity weakness.  She has chronic lower extremity swelling.  She lives alone.  Her daughter is at the bedside.  No urinary complaints no frequency burning or incontinence.  04/03/2017: Patient seen and examined at her bedside.  She reports moderate lower back pain.  She has suspected thoracic discitis and osteomyelitis.  Infectious disease following and recommends holding off on empiric gram-negative rod coverage pending results of aspirate Gram stain and culture.  ID recommends restarting vancomycin after procedure.  04/04/17: seen and examined at her bedside. Anxious about procedure planned today. No new complaints. Back pain is well controlled on current pain management.  04/05/2017: Seen and examined at her bedside.  She has no new complaints.  Plan for aspiration of T10-T11 disc space by IR.    Assessment/Plan: Principal Problem:   Thoracic discitis Active Problems:   Hypothyroidism   DM2 (diabetes mellitus, type 2) (HCC)   Benign essential HTN   HTN (hypertension)   THYROID CANCER, HX OF   Osteomyelitis (HCC)   Peripheral neuropathy   Status post lumbar spinal fusion   Recurrent UTI   Chronic renal insufficiency   Normocytic anemia  Suspected thoracic T10-T11 discitis/osteomyelitis Infectious disease consulted and following IR consulted for aspiration.  Fluoroscopy guided needle  aspiration done today and culture was sent for analysis 04/05/2017. ID recommends holding off antibiotics until aspirate then Start vancomycin IV after procedure Pain management in place with Norco 1 tablet every 6 hours as needed 04/05/2017 restart IV vancomycin as recommended by ID.  Positive blood culture 1/2 Possible contaminate Culture from 04/02/17 positive 1/2 bottles- gram positive cocci  AKI on CKD 3: Improving Creatinine today on 04/05/2017 1.27 from 1.62 from 1.8 on admission Baseline creatinine 1.3 Avoid nephrotoxic agent/hypotension/dehydration Repeat chemistry panel in the morning  Chronic normocytic anemia Baseline hemoglobin 10 Hemoglobin stable at 9.2 No overt sign of bleeding Repeat CBC in the morning  Chronic heart failure with reduced EF 35-40% Last echo on medical record 54/27/0623 Grade 1 diastolic dysfunction and estimated ejection fraction of 35-40% Does not appear to be in exacerbation  Spinal stenosis of lumbar region post surgery x2 Previous spinal epidural abscess 9 years ago  History of thyroid carcinoma/postsurgical hypothyroidism No acute issues Continue Synthroid  Hypertension Blood pressures well controlled Continue Norvasc  Type 2 diabetes Continue insulin Avoid hypoglycemia  Chronic constipation Continue Senokot  Morbid obesity BMI 41 Weight loss outpatient  Hyperlipidemia Continue Lipitor    Code Status: Full code  Family Communication: None  Disposition Plan: Home versus SNF when clinically stable   Consultants:  Infectious disease  Procedures:  Disc aspiration by IR 04/05/17  Antimicrobials:  IV vancomycin  DVT prophylaxis:  SCDs, hep sq tid  Objective: Vitals:   04/05/17 1310 04/05/17 1315 04/05/17 1320 04/05/17 1322  BP: (!) 155/67 130/66 (!) 117/56 (!) 117/56  Pulse: 74 72 62 67  Resp: 15  13 12 11   Temp:      TempSrc:      SpO2: 97% 97% 95% 96%  Weight:      Height:        Intake/Output Summary  (Last 24 hours) at 04/05/2017 1339 Last data filed at 04/05/2017 0932 Gross per 24 hour  Intake 120 ml  Output 1500 ml  Net -1380 ml   Filed Weights   04/02/17 1333  Weight: 113.4 kg (250 lb)    Exam: 04/05/17   General: 77 yo AAF WD WN NAD A&O x 3  Cardiovascular: RRR no rubs or gallops. No JVD or thyromegaly.  Respiratory: Clear to auscultation with no wheezes or rales.  Abdomen: Obese nondistended with normal bowel sounds x4 quadrant   Musculoskeletal: No focal motor deficits.  Skin: Scar noted at mid lower back.  Psychiatry: Mood is appropriate for condition and setting.   Data Reviewed: CBC: Recent Labs  Lab 04/02/17 0823 04/04/17 0601 04/05/17 0603  WBC 10.4 10.3 9.8  NEUTROABS 8.0*  --   --   HGB 9.2* 9.1* 9.3*  HCT 29.0* 28.9* 28.5*  MCV 97.6 95.7 95.0  PLT 339 297 562   Basic Metabolic Panel: Recent Labs  Lab 04/02/17 0823 04/05/17 0603  NA 142 141  K 4.0 3.7  CL 104 103  CO2 28 29  GLUCOSE 149* 135*  BUN 27* 18  CREATININE 1.62* 1.27*  CALCIUM 9.1 8.8*   GFR: Estimated Creatinine Clearance: 45.9 mL/min (A) (by C-G formula based on SCr of 1.27 mg/dL (H)). Liver Function Tests: Recent Labs  Lab 04/02/17 0823  AST 14*  ALT 11*  ALKPHOS 48  BILITOT 0.4  PROT 7.1  ALBUMIN 3.9   No results for input(s): LIPASE, AMYLASE in the last 168 hours. No results for input(s): AMMONIA in the last 168 hours. Coagulation Profile: Recent Labs  Lab 04/04/17 0601  INR 1.02   Cardiac Enzymes: No results for input(s): CKTOTAL, CKMB, CKMBINDEX, TROPONINI in the last 168 hours. BNP (last 3 results) No results for input(s): PROBNP in the last 8760 hours. HbA1C: No results for input(s): HGBA1C in the last 72 hours. CBG: Recent Labs  Lab 04/04/17 1119 04/04/17 1714 04/04/17 2129 04/05/17 0754 04/05/17 1107  GLUCAP 135* 154* 167* 142* 135*   Lipid Profile: No results for input(s): CHOL, HDL, LDLCALC, TRIG, CHOLHDL, LDLDIRECT in the last 72  hours. Thyroid Function Tests: No results for input(s): TSH, T4TOTAL, FREET4, T3FREE, THYROIDAB in the last 72 hours. Anemia Panel: No results for input(s): VITAMINB12, FOLATE, FERRITIN, TIBC, IRON, RETICCTPCT in the last 72 hours. Urine analysis:    Component Value Date/Time   COLORURINE STRAW (A) 04/02/2017 1235   APPEARANCEUR CLEAR 04/02/2017 1235   LABSPEC 1.005 04/02/2017 1235   PHURINE 7.0 04/02/2017 1235   GLUCOSEU NEGATIVE 04/02/2017 1235   HGBUR NEGATIVE 04/02/2017 1235   BILIRUBINUR NEGATIVE 04/02/2017 1235   KETONESUR NEGATIVE 04/02/2017 1235   PROTEINUR NEGATIVE 04/02/2017 1235   UROBILINOGEN 0.2 06/24/2014 1243   NITRITE NEGATIVE 04/02/2017 1235   LEUKOCYTESUR NEGATIVE 04/02/2017 1235   Sepsis Labs: @LABRCNTIP (procalcitonin:4,lacticidven:4)  ) Recent Results (from the past 240 hour(s))  Culture, blood (routine x 2)     Status: None (Preliminary result)   Collection Time: 04/02/17 12:36 PM  Result Value Ref Range Status   Specimen Description   Final    BLOOD LEFT ANTECUBITAL Performed at [facility], [place] [address], [place], [place] [address]    Special Requests  Final    BOTTLES DRAWN AEROBIC AND ANAEROBIC Blood Culture adequate volume Performed at [facility] [address], [place], [place] [address]    Culture  Setup Time   Final    GRAM POSITIVE COCCI AEROBIC BOTTLE ONLY Organism ID to follow CRITICAL RESULT CALLED TO, READ BACK BY AND VERIFIED WITH: Sheffield Slider [facility] 04/05/17 3419 JDW Performed at [facility], [address], [place], [place] [address]    Culture GRAM POSITIVE COCCI  Final   Report Status PENDING  Incomplete  Culture, blood (routine x 2)     Status: None (Preliminary result)   Collection Time: 04/02/17 12:36 PM  Result Value Ref Range Status   Specimen Description   Final    BLOOD RIGHT WRIST Performed at [place] [address], [place], [place] [address]    Special  Requests   Final    BOTTLES DRAWN AEROBIC AND ANAEROBIC Blood Culture adequate volume Performed at [facility] [address], [place], [place] [address]    Culture   Final    NO GROWTH 3 DAYS Performed at [facility], [place] [address] [place], [place] [address]    Report Status PENDING  Incomplete  Blood Culture ID Panel (Reflexed)     Status: None   Collection Time: 04/02/17 12:36 PM  Result Value Ref Range Status   Enterococcus species NOT DETECTED NOT DETECTED Final   Listeria monocytogenes NOT DETECTED NOT DETECTED Final    Comment: CRITICAL RESULT CALLED TO, READ BACK BY AND VERIFIED WITH: Renford Dills [facility] 04/05/17 0726 JDW    Staphylococcus species NOT DETECTED NOT DETECTED Final   Staphylococcus aureus NOT DETECTED NOT DETECTED Final   Streptococcus species NOT DETECTED NOT DETECTED Final   Streptococcus agalactiae NOT DETECTED NOT DETECTED Final   Streptococcus pneumoniae NOT DETECTED NOT DETECTED Final   Streptococcus pyogenes NOT DETECTED NOT DETECTED Final   Acinetobacter baumannii NOT DETECTED NOT DETECTED Final   Enterobacteriaceae species NOT DETECTED NOT DETECTED Final   Enterobacter cloacae complex NOT DETECTED NOT DETECTED Final   Escherichia coli NOT DETECTED NOT DETECTED Final   Klebsiella oxytoca NOT DETECTED NOT DETECTED Final   Klebsiella pneumoniae NOT DETECTED NOT DETECTED Final   Proteus species NOT DETECTED NOT DETECTED Final   Serratia marcescens NOT DETECTED NOT DETECTED Final   Haemophilus influenzae NOT DETECTED NOT DETECTED Final   Neisseria meningitidis NOT DETECTED NOT DETECTED Final   Pseudomonas aeruginosa NOT DETECTED NOT DETECTED Final   Candida albicans NOT DETECTED NOT DETECTED Final   Candida glabrata NOT DETECTED NOT DETECTED Final   Candida krusei NOT DETECTED NOT DETECTED Final   Candida parapsilosis NOT DETECTED NOT DETECTED Final   Candida tropicalis NOT DETECTED NOT DETECTED Final    Comment: Performed at  [facility], [place]. [address], [place],  [address]      Studies: No results found.  Scheduled Meds: . amLODipine  10 mg Oral Daily  . atorvastatin  20 mg Oral Daily  . calcitRIOL  0.5 mcg Oral Daily  . calcium-vitamin D  1 tablet Oral Q breakfast  . dipyridamole-aspirin  1 capsule Oral BID  . fentaNYL      . heparin injection (subcutaneous)  5,000 Units Subcutaneous Q8H  . insulin aspart  0-9 Units Subcutaneous TID WC  . levothyroxine  137 mcg Oral QAC breakfast  . lidocaine (PF)      . midazolam      .  pregabalin  25 mg Oral BID  . senna-docusate  1 tablet Oral BID  . sodium chloride        Continuous Infusions:   LOS: 3 days     Kayleen Memos, MD [facility] Pager [phone]  If 7PM-7AM, please contact night-coverage [email or web address] Password [email or web address] 04/05/2017, 1:39 PM

## 2017-04-05 NOTE — Progress Notes (Signed)
Pharmacy Antibiotic Note  [name] is a 77 y.o. female admitted on 04/02/2017 with suspected discitis/osteomyelitis T10-T11 Pharmacy initially was consulted for vancomycin dosing.  Due to current renal function, ID has decided to d/c Vanc and has asked pharmacy to dose Cubicin to avoid any further potential nephrotoxicity.  Plan: - Patient received Vancomycin 2500mg  IV x 1 @ 14:41 today - Dr Linus Salmons noted in consult to begin Daptomycin on 04/05/17 - On 04/05/17, will begin Daptomycin 500mg  (6mg /kg based on adjusted body weight due to BMI = 41) - Due to potential increase of CK elevation in obese patient while on daptomycin, will monitor CK weekly while on therapy. - Should atorvastatin be held while on daptomycin?  Follow renal function, cultures and clinical course   Height: 5\' 5"  (165.1 cm) Weight: 250 lb (113.4 kg) IBW/kg (Calculated) : 57  Temp (24hrs), Avg:97.8 F (36.6 C), Min:97.7 F (36.5 C), Max:98 F (36.7 C)  Recent Labs  Lab 04/02/17 0823 04/02/17 0851 04/04/17 0601 04/05/17 0603  WBC 10.4  --  10.3 9.8  CREATININE 1.62*  --   --  1.27*  LATICACIDVEN  --  0.73  --   --     Estimated Creatinine Clearance: 45.9 mL/min (A) (by C-G formula based on SCr of 1.27 mg/dL (H)).    Allergies  Allergen Reactions  . Actos [Pioglitazone Hydrochloride] Swelling  . Ativan [Lorazepam]     swelling  . Cephalexin Other (See Comments)    Doesn't remember   . Oxycodone     This medication makes her feel sick  . Rosiglitazone     unknown  . Tramadol Other (See Comments)    hallucinations    Antimicrobials this admission: 3/30 cefepime x 1 3/30 vanc x 1>> resumed 4/2>> d/c'ed 4/2 4/3 daptomycin >> Dose adjustments this admission:   Microbiology results: 3/30 BCx: GPC 4/2 Abscess:  Thank you for allowing pharmacy to be a part of this patient's care.  Leone Haven, PharmD 04/05/2017, 4:27 PM

## 2017-04-05 NOTE — Progress Notes (Signed)
Pharmacy Antibiotic Note  [name] is a 77 y.o. female admitted on 04/02/2017 with suspected discitis/osteomyelitis T10-T11 Pharmacy has been consulted for vancomycin dosing.  Plan: Vancomycin 2500mg  IV x 1 then vanc 1250mg  IV q36h ( AUC 508.9, Scr 1.27) Follow renal function, cultures and clinical course vanc peak and trough as needed  Height: 5\' 5"  (165.1 cm) Weight: 250 lb (113.4 kg) IBW/kg (Calculated) : 57  Temp (24hrs), Avg:97.9 F (36.6 C), Min:97.7 F (36.5 C), Max:98.1 F (36.7 C)  Recent Labs  Lab 04/02/17 0823 04/02/17 0851 04/04/17 0601 04/05/17 0603  WBC 10.4  --  10.3 9.8  CREATININE 1.62*  --   --  1.27*  LATICACIDVEN  --  0.73  --   --     Estimated Creatinine Clearance: 45.9 mL/min (A) (by C-G formula based on SCr of 1.27 mg/dL (H)).    Allergies  Allergen Reactions  . Actos [Pioglitazone Hydrochloride] Swelling  . Ativan [Lorazepam]     swelling  . Cephalexin Other (See Comments)    Doesn't remember   . Oxycodone     This medication makes her feel sick  . Rosiglitazone     unknown  . Tramadol Other (See Comments)    hallucinations    Antimicrobials this admission: 3/30 cefepime x 1 3/30 vanc x 1>> resumed 4/2>> Dose adjustments this admission:   Microbiology results: 3/30 BCx: GPC 4/2 Abscess:  Thank you for allowing pharmacy to be a part of this patient's care.  [name] RPh 04/05/2017, 1:58 PM Pager [phone]

## 2017-04-05 NOTE — Progress Notes (Signed)
PHARMACY - PHYSICIAN COMMUNICATION CRITICAL VALUE ALERT - BLOOD CULTURE IDENTIFICATION (BCID)  [name] is an 77 y.o. female who presented to [facility] on [facility]/02/2017 with a history of progressively worsening mid back pain and recent MRI findings concerning for T10/11 disc osteomyelitis   Name of physician (or Provider) Contacted: [place] Crane  Current antibiotics: none currently but per ID note will start vancomycin after disc aspiration procedure completed     Results for orders placed or performed during the hospital encounter of 04/02/17  Blood Culture ID Panel (Reflexed) (Collected: 04/02/2017 12:36 PM)  Result Value Ref Range   Enterococcus species NOT DETECTED NOT DETECTED   Listeria monocytogenes NOT DETECTED NOT DETECTED   Staphylococcus species NOT DETECTED NOT DETECTED   Staphylococcus aureus NOT DETECTED NOT DETECTED   Streptococcus species NOT DETECTED NOT DETECTED   Streptococcus agalactiae NOT DETECTED NOT DETECTED   Streptococcus pneumoniae NOT DETECTED NOT DETECTED   Streptococcus pyogenes NOT DETECTED NOT DETECTED   Acinetobacter baumannii NOT DETECTED NOT DETECTED   Enterobacteriaceae species NOT DETECTED NOT DETECTED   Enterobacter cloacae complex NOT DETECTED NOT DETECTED   Escherichia coli NOT DETECTED NOT DETECTED   Klebsiella oxytoca NOT DETECTED NOT DETECTED   Klebsiella pneumoniae NOT DETECTED NOT DETECTED   Proteus species NOT DETECTED NOT DETECTED   Serratia marcescens NOT DETECTED NOT DETECTED   Haemophilus influenzae NOT DETECTED NOT DETECTED   Neisseria meningitidis NOT DETECTED NOT DETECTED   Pseudomonas aeruginosa NOT DETECTED NOT DETECTED   Candida albicans NOT DETECTED NOT DETECTED   Candida glabrata NOT DETECTED NOT DETECTED   Candida krusei NOT DETECTED NOT DETECTED   Candida parapsilosis NOT DETECTED NOT DETECTED   Candida tropicalis NOT DETECTED NOT DETECTED    [name] RPh 04/05/2017, 7:51 AM Pager [phone]

## 2017-04-05 NOTE — Progress Notes (Signed)
[place] for Infectious Disease   Reason for visit: Follow up on discitis  Interval History: continued pain; started vancomycin after procedure; no fever, no chills.  No associated diarrhea.   Physical Exam: Constitutional:  Vitals:   04/05/17 1322 04/05/17 1347  BP: (!) 117/56 (!) 112/48  Pulse: 67 61  Resp: 11 20  Temp:  97.7 F (36.5 C)  SpO2: 96% 93%   patient appears in NAD HENT: no thrush Respiratory: Normal respiratory effort; CTA B Cardiovascular: RRR GI: soft, nt, nd  Review of Systems: Constitutional: negative for fevers, chills and malaise Gastrointestinal: negative for diarrhea Musculoskeletal: negative for myalgias and arthralgias  Lab Results  Component Value Date   WBC 9.8 04/05/2017   HGB 9.3 (L) 04/05/2017   HCT 28.5 (L) 04/05/2017   MCV 95.0 04/05/2017   PLT 286 04/05/2017    Lab Results  Component Value Date   CREATININE 1.27 (H) 04/05/2017   BUN 18 04/05/2017   NA 141 04/05/2017   K 3.7 04/05/2017   CL 103 04/05/2017   CO2 29 04/05/2017    Lab Results  Component Value Date   ALT 11 (L) 04/02/2017   AST 14 (L) 04/02/2017   ALKPHOS 48 04/02/2017     Microbiology: Recent Results (from the past 240 hour(s))  Culture, blood (routine x 2)     Status: None (Preliminary result)   Collection Time: 04/02/17 12:36 PM  Result Value Ref Range Status   Specimen Description   Final    BLOOD LEFT ANTECUBITAL Performed at [facility], [address], [place], [place] [address]    Special Requests   Final    BOTTLES DRAWN AEROBIC AND ANAEROBIC Blood Culture adequate volume Performed at [facility] [address], [place], [place] [address]    Culture  Setup Time   Final    GRAM POSITIVE COCCI AEROBIC BOTTLE ONLY Organism ID to follow CRITICAL RESULT CALLED TO, READ BACK BY AND VERIFIED WITH: Sheffield Slider [facility] 04/05/17 4709 JDW Performed at [facility], [address], [place], [place] [address]    Culture GRAM POSITIVE COCCI  Final   Report Status PENDING  Incomplete  Culture, blood (routine x 2)     Status: None (Preliminary result)   Collection Time: 04/02/17 12:36 PM  Result Value Ref Range Status   Specimen Description   Final    BLOOD RIGHT WRIST Performed at [facility] [address], [place], [place] [address]    Special Requests   Final    BOTTLES DRAWN AEROBIC AND ANAEROBIC Blood Culture adequate volume Performed at [facility] [address] [place], [place] [address]    Culture   Final    NO GROWTH 3 DAYS Performed at [facility], [place] [address], [place], [place] [address]    Report Status PENDING  Incomplete  Blood Culture ID Panel (Reflexed)     Status: None   Collection Time: 04/02/17 12:36 PM  Result Value Ref Range Status   Enterococcus species NOT DETECTED NOT DETECTED Final   Listeria monocytogenes NOT DETECTED NOT DETECTED Final    Comment: CRITICAL RESULT CALLED TO, READ BACK BY AND VERIFIED WITH: Renford Dills [facility] 04/05/17 0726 JDW    Staphylococcus species NOT DETECTED NOT DETECTED Final   Staphylococcus aureus NOT DETECTED NOT DETECTED Final   Streptococcus species NOT DETECTED NOT DETECTED Final   Streptococcus agalactiae NOT DETECTED NOT DETECTED Final  Streptococcus pneumoniae NOT DETECTED NOT DETECTED Final   Streptococcus pyogenes NOT DETECTED NOT DETECTED Final   Acinetobacter baumannii NOT DETECTED NOT DETECTED Final   Enterobacteriaceae species NOT DETECTED NOT DETECTED Final   Enterobacter cloacae complex NOT DETECTED NOT DETECTED Final   Escherichia coli NOT DETECTED NOT DETECTED Final   Klebsiella oxytoca NOT DETECTED NOT DETECTED Final   Klebsiella pneumoniae NOT DETECTED NOT DETECTED Final   Proteus species NOT DETECTED NOT DETECTED Final   Serratia marcescens NOT DETECTED NOT DETECTED Final   Haemophilus influenzae NOT DETECTED NOT DETECTED Final   Neisseria meningitidis NOT DETECTED  NOT DETECTED Final   Pseudomonas aeruginosa NOT DETECTED NOT DETECTED Final   Candida albicans NOT DETECTED NOT DETECTED Final   Candida glabrata NOT DETECTED NOT DETECTED Final   Candida krusei NOT DETECTED NOT DETECTED Final   Candida parapsilosis NOT DETECTED NOT DETECTED Final   Candida tropicalis NOT DETECTED NOT DETECTED Final    Comment: Performed at [facility], [place] [address], [place], [place] [address]    Impression/Plan:  1. Discitis - endplate changes and fluid most c/w infection at T10-11.  Now s/p aspiration.  Will monitor cultures.  On vancomycin per my recommendation. Will need prolonged course.  Likely will need picc line but will wait for gram stain to see if oral therapy possible.   2.  Positive blood culture - negative on BCID, GPC. Likely contaminate.  3.  AKI - I actually will change vancomycin to daptomycin to avoid potential nephrotoxicity while waiting for cultures.

## 2017-04-05 NOTE — Procedures (Signed)
Pre procedural Dx: Concern for disciits/osteomyelitis of T10-T11 Post procedural Dx: Same  Technically successful fluoro guided aspiration of tiny amount of bloody fluid from T10-T11 disc space.   All aspirated samples sent to the laboratory for analysis.    EBL: None  Complications: None immediate  Ronny Bacon, MD Pager #: [phone]

## 2017-04-05 NOTE — Sedation Documentation (Signed)
PIv was flushed prior to entering room, but was noted to be leakign once in room.

## 2017-04-05 NOTE — Care Management Important Message (Signed)
Important Message  Patient Details  Name: KYLIEANN EAGLES MRN: [id number] Date of Birth: May 03, 1940   Medicare Important Message Given:  Yes    Kerin Salen 04/05/2017, 10:28 [place] Message  Patient Details  Name: HENRINE HAYTER MRN: [id number] Date of Birth: 12/01/1940   Medicare Important Message Given:  Yes    Kerin Salen 04/05/2017, 10:27 AM

## 2017-04-06 DIAGNOSIS — E890 Postprocedural hypothyroidism: Secondary | ICD-10-CM

## 2017-04-06 DIAGNOSIS — D649 Anemia, unspecified: Secondary | ICD-10-CM

## 2017-04-06 DIAGNOSIS — Z981 Arthrodesis status: Secondary | ICD-10-CM

## 2017-04-06 DIAGNOSIS — K5900 Constipation, unspecified: Secondary | ICD-10-CM | POA: Diagnosis present

## 2017-04-06 DIAGNOSIS — Z85850 Personal history of malignant neoplasm of thyroid: Secondary | ICD-10-CM

## 2017-04-06 DIAGNOSIS — E119 Type 2 diabetes mellitus without complications: Secondary | ICD-10-CM

## 2017-04-06 LAB — CBC
HEMATOCRIT: 27.2 % — AB (ref 36.0–46.0)
Hemoglobin: 8.8 g/dL — ABNORMAL LOW (ref 12.0–15.0)
MCH: 31.3 pg (ref 26.0–34.0)
MCHC: 32.4 g/dL (ref 30.0–36.0)
MCV: 96.8 fL (ref 78.0–100.0)
Platelets: 285 10*3/uL (ref 150–400)
RBC: 2.81 MIL/uL — AB (ref 3.87–5.11)
RDW: 13.5 % (ref 11.5–15.5)
WBC: 11.8 10*3/uL — AB (ref 4.0–10.5)

## 2017-04-06 LAB — BASIC METABOLIC PANEL
ANION GAP: 10 (ref 5–15)
BUN: 25 mg/dL — ABNORMAL HIGH (ref 6–20)
CO2: 25 mmol/L (ref 22–32)
Calcium: 8.5 mg/dL — ABNORMAL LOW (ref 8.9–10.3)
Chloride: 103 mmol/L (ref 101–111)
Creatinine, Ser: 1.36 mg/dL — ABNORMAL HIGH (ref 0.44–1.00)
GFR, EST AFRICAN AMERICAN: 42 mL/min — AB (ref >60)
GFR, EST NON AFRICAN AMERICAN: 36 mL/min — AB (ref >60)
Glucose, Bld: 140 mg/dL — ABNORMAL HIGH (ref 65–99)
POTASSIUM: 4.1 mmol/L (ref 3.5–5.1)
Sodium: 138 mmol/L (ref 135–145)

## 2017-04-06 LAB — CULTURE, BLOOD (ROUTINE X 2): SPECIAL REQUESTS: ADEQUATE

## 2017-04-06 LAB — FERRITIN: Ferritin: 60 ng/mL (ref 11–307)

## 2017-04-06 LAB — GLUCOSE, CAPILLARY
Glucose-Capillary: 137 mg/dL — ABNORMAL HIGH (ref 65–99)
Glucose-Capillary: 131 mg/dL — ABNORMAL HIGH (ref 65–99)
Glucose-Capillary: 127 mg/dL — ABNORMAL HIGH (ref 65–99)
Glucose-Capillary: 135 mg/dL — ABNORMAL HIGH (ref 65–99)

## 2017-04-06 LAB — IRON AND TIBC
IRON: 35 ug/dL (ref 28–170)
Saturation Ratios: 14 % (ref 10.4–31.8)
TIBC: 251 ug/dL (ref 250–450)
UIBC: 216 ug/dL

## 2017-04-06 LAB — VITAMIN B12: VITAMIN B 12: 469 pg/mL (ref 180–914)

## 2017-04-06 LAB — FOLATE: FOLATE: 19.5 ng/mL (ref >5.9)

## 2017-04-06 MED ORDER — LEVOFLOXACIN 750 MG PO TABS
750.0000 mg | ORAL_TABLET | ORAL | Status: DC
  Administered 2017-04-06 – 2017-04-08 (×2): 750 mg via ORAL
  Filled 2017-04-06 (×2): qty 1

## 2017-04-06 MED ORDER — SODIUM CHLORIDE 0.9 % IV SOLN: INTRAVENOUS | Status: DC

## 2017-04-06 MED ORDER — POLYETHYLENE GLYCOL 3350 17 G PO PACK
17.0000 g | PACK | Freq: Two times a day (BID) | ORAL | Status: DC
  Administered 2017-04-06 – 2017-04-09 (×7): 17 g via ORAL
  Filled 2017-04-06 (×7): qty 1

## 2017-04-06 MED ORDER — SODIUM CHLORIDE 0.9 % IV SOLN
INTRAVENOUS | Status: AC
  Administered 2017-04-06 – 2017-04-07 (×2): via INTRAVENOUS

## 2017-04-06 MED ORDER — SIMETHICONE 80 MG PO CHEW
160.0000 mg | CHEWABLE_TABLET | Freq: Three times a day (TID) | ORAL | Status: DC
  Administered 2017-04-06 – 2017-04-09 (×10): 160 mg via ORAL
  Filled 2017-04-06 (×10): qty 2

## 2017-04-06 MED ORDER — SORBITOL 70 % SOLN
960.0000 mL | TOPICAL_OIL | Freq: Once | ORAL | Status: AC
  Administered 2017-04-06: 960 mL via RECTAL
  Filled 2017-04-06: qty 473

## 2017-04-06 MED ORDER — SIMETHICONE 80 MG PO CHEW: 160.0000 mg | CHEWABLE_TABLET | Freq: Four times a day (QID) | ORAL | Status: DC

## 2017-04-06 NOTE — Progress Notes (Addendum)
    [facility] for Infectious Disease   Reason for visit: Follow up on discitis  Interval History: no growth.  No fever.   Physical Exam: Constitutional:  Vitals:   04/05/17 2222 04/06/17 0528  BP: 111/62 (!) 118/53  Pulse: 62 (!) 59  Resp: 20 20  Temp: 97.8 F (36.6 C) 98 F (36.7 C)  SpO2: 99% 97%   patient appears in NAD  Impression: Discitis  Plan: 1.  Continue daptomycin. Would be ideal to stop the statin if possible.  I will add levaquin oral.  Will need 6 weeks.  2. Ok to place picc line or central access.  I will defer to the primary team if picc line needs to be avoided with renal disease.  Will continue to follow

## 2017-04-06 NOTE — Progress Notes (Addendum)
Pharmacy Antibiotic Note  [name] is a 77 y.o. female admitted on 04/02/2017 with  suspected discitis/osteomyelitis T10-T11.   Pharmacy has been consulted for levaquin po in addition to the daptomycin.  Plan: -levaquin 750mg  po Q48h for CrCl < 50 mls/min -Continue daptomycin 500mg  (6mg /kg based on adjusted body weight due to BMI = 41) - d/w Dr. [place] Silos, due to concern for potential increase in myopathy in obese pts while on daptomycin,  atorvastatin has been held -f/u cultures, CK and clinical course    Height: 5\' 5"  (165.1 cm) Weight: 250 lb (113.4 kg) IBW/kg (Calculated) : 57  Temp (24hrs), Avg:97.9 F (36.6 C), Min:97.8 F (36.6 C), Max:98 F (36.7 C)  Recent Labs  Lab 04/02/17 0823 04/02/17 0851 04/04/17 0601 04/05/17 0603 04/06/17 0609  WBC 10.4  --  10.3 9.8 11.8*  CREATININE 1.62*  --   --  1.27* 1.36*  LATICACIDVEN  --  0.73  --   --   --     Estimated Creatinine Clearance: 42.8 mL/min (A) (by C-G formula based on SCr of 1.36 mg/dL (H)).    Allergies  Allergen Reactions  . Actos [Pioglitazone Hydrochloride] Swelling  . Ativan [Lorazepam]     swelling  . Cephalexin Other (See Comments)    Doesn't remember   . Oxycodone     This medication makes her feel sick  . Rosiglitazone     unknown  . Tramadol Other (See Comments)    hallucinations    Antimicrobials this admission: 3/30 cefepime x 1 3/30 vanc x 1>> resumed 4/2>> d/c'ed 4/2 4/3 daptomycin >> 4/3 levaquin >>  Dose adjustments this admission:   Microbiology results: 3/30 BCx: GPC 4/2 Abscess:   Thank you for allowing pharmacy to be a part of this patient's care.  [name] RPh 04/06/2017, 1:51 PM Pager [phone]

## 2017-04-06 NOTE — Progress Notes (Signed)
PROGRESS NOTE    [name]  GDJ:[id number] DOB: [birth year] DOA: 04/02/2017 PCP: Maurice Small, MD    Brief Narrative:  Amber Skolnick Gaineyis a 77 y.o.femalewith medical history significant ofchronic back pain patient went to see her primary care physician who ordered a MRI of her thoracic spine which showed. Endplate changes in fluid in the disc space at T10-11 is highly concerning for disc osteomyelitis. Patient was unable to get an MRI with contrast in [facility] as admission was too small for her. She denied any nausea vomiting diarrhea constipation fever chills. She denied any lower extremity weakness. She has chronic lower extremity swelling. She lives alone. Her daughter is at the bedside. No urinary complaints no frequency burning or incontinence. She has subsequently underwent fluoroscopic guided aspiration of the thoracic spine by interventional radiology on 04/05/2017.  Patient on IV daptomycin.  Levaquin added to patient's regimen.     Assessment & Plan:   Principal Problem:   Thoracic discitis Active Problems:   Hypothyroidism   DM2 (diabetes mellitus, type 2) (HCC)   Benign essential HTN   HTN (hypertension)   THYROID CANCER, HX OF   Osteomyelitis (HCC)   Peripheral neuropathy   Status post lumbar spinal fusion   Recurrent UTI   Chronic renal insufficiency   Normocytic anemia   Constipation  1 suspected thoracic T10-T11 discitis/osteomyelitis Questionable etiology.  Patient status post fluoroscopic guided aspiration 04/05/2017 per Dr. Pascal Lux results currently pending.  IV vancomycin was started after the procedure however this has been changed to IV daptomycin in light of renal function per ID.  Levaquin added to regimen.  Per ID patient likely require 6 weeks of IV antibiotics.  ID following.  2.  Acute chronic kidney injury on chronic kidney disease stage III Baseline creatinine 1.3.  Creatinine on admission was 1.8 and currently down to 1.3 close to  baseline.  Continue gentle hydration for the next 24 hours.  Continue calcitriol.  3.  Constipation Patient complain of significant constipation.  Will place on MiraLAX twice daily, Senokot S2 twice daily.  We will give a smog enema x1.  Follow.  4.  Chronic normocytic anemia Patient with no signs or symptoms of bleeding.  Hemoglobin currently at 8.8.  Follow H&H.  5.  Chronic systolic heart failure with reduced EF of 35-40% Currently stable.  Patient with grade 1 diastolic dysfunction EF 62-22%.  Compensated.  Monitor closely with hydration.  6.  History of spinal stenosis of the lumbar region status post surgery x2 Prior history of spinal epidural abscess approximately 9 years ago.  7.  History of thyroid carcinoma/postsurgical hypothyroidism Continue home dose Synthroid.  8.  Hypertension Stable.  Continue Norvasc.  9.  Type 2 diabetes mellitus Check a hemoglobin A1c.  CBGs ranging from 131-156.  Continue sliding scale insulin.  10.  Morbid obesity Weight loss.  11.  Hyperlipidemia Will discontinue statin due to possible interactions with daptomycin.   DVT prophylaxis: Heparin Code Status: Full Family Communication: Updated patient and family at bedside. Disposition Plan: Home once aspiration results are finalized, pain managed, and per ID.   Consultants:   Infectious disease: Dr. Michel Bickers 04/03/2017  Procedures:   Fluoroscopic guided T10-T11 disc aspiration per IR, Dr. Pascal Lux 04/05/2017  Antimicrobials:   Oral Levaquin for 03/2017  IV daptomycin 04/06/2017  IV vancomycin 04/02/2017>>>>> 04/05/2017   Subjective: Seen sitting in chair eating lunch.  Patient complaining of constipation and asking for an enema.  Patient states back pain improving.  No shortness of breath.  No chest pain.  Objective: Vitals:   04/05/17 1322 04/05/17 1347 04/05/17 2222 04/06/17 0528  BP: (!) 117/56 (!) 112/48 111/62 (!) 118/53  Pulse: 67 61 62 (!) 59  Resp: 11 20 20 20   Temp:   97.7 F (36.5 C) 97.8 F (36.6 C) 98 F (36.7 C)  TempSrc:  Oral Oral Oral  SpO2: 96% 93% 99% 97%  Weight:      Height:        Intake/Output Summary (Last 24 hours) at 04/06/2017 1204 Last data filed at 04/05/2017 1847 Gross per 24 hour  Intake 980 ml  Output -  Net 980 ml   Filed Weights   04/02/17 1333  Weight: 113.4 kg (250 lb)    Examination:  General exam: Appears calm and comfortable  Respiratory system: Clear to auscultation. Respiratory effort normal. Cardiovascular system: S1 & S2 heard, RRR. No JVD, murmurs, rubs, gallops or clicks. No pedal edema. Gastrointestinal system: Abdomen is nondistended, soft and mild tenderness to palpation in the epigastrium and right upper quadrant.  No organomegaly or masses felt. Normal bowel sounds heard. Central nervous system: Alert and oriented. No focal neurological deficits. Extremities: Symmetric 5 x 5 power. Skin: No rashes, lesions or ulcers Psychiatry: Judgement and insight appear normal. Mood & affect appropriate.     Data Reviewed: I have personally reviewed following labs and imaging studies  CBC: Recent Labs  Lab 04/02/17 0823 04/04/17 0601 04/05/17 0603 04/06/17 0609  WBC 10.4 10.3 9.8 11.8*  NEUTROABS 8.0*  --   --   --   HGB 9.2* 9.1* 9.3* 8.8*  HCT 29.0* 28.9* 28.5* 27.2*  MCV 97.6 95.7 95.0 96.8  PLT 339 297 286 945   Basic Metabolic Panel: Recent Labs  Lab 04/02/17 0823 04/05/17 0603 04/06/17 0609  NA 142 141 138  K 4.0 3.7 4.1  CL 104 103 103  CO2 28 29 25   GLUCOSE 149* 135* 140*  BUN 27* 18 25*  CREATININE 1.62* 1.27* 1.36*  CALCIUM 9.1 8.8* 8.5*   GFR: Estimated Creatinine Clearance: 42.8 mL/min (A) (by C-G formula based on SCr of 1.36 mg/dL (H)). Liver Function Tests: Recent Labs  Lab 04/02/17 0823  AST 14*  ALT 11*  ALKPHOS 48  BILITOT 0.4  PROT 7.1  ALBUMIN 3.9   No results for input(s): LIPASE, AMYLASE in the last 168 hours. No results for input(s): AMMONIA in the last 168  hours. Coagulation Profile: Recent Labs  Lab 04/04/17 0601  INR 1.02   Cardiac Enzymes: No results for input(s): CKTOTAL, CKMB, CKMBINDEX, TROPONINI in the last 168 hours. BNP (last 3 results) No results for input(s): PROBNP in the last 8760 hours. HbA1C: No results for input(s): HGBA1C in the last 72 hours. CBG: Recent Labs  Lab 04/05/17 1107 04/05/17 1635 04/05/17 2222 04/06/17 0738 04/06/17 1200  GLUCAP 135* 155* 156* 137* 131*   Lipid Profile: No results for input(s): CHOL, HDL, LDLCALC, TRIG, CHOLHDL, LDLDIRECT in the last 72 hours. Thyroid Function Tests: No results for input(s): TSH, T4TOTAL, FREET4, T3FREE, THYROIDAB in the last 72 hours. Anemia Panel: Recent Labs    04/06/17 0849  VITAMINB12 469  FOLATE 19.5  FERRITIN 60  TIBC 251  IRON 35   Sepsis Labs: Recent Labs  Lab 04/02/17 0851  LATICACIDVEN 0.73    Recent Results (from the past 240 hour(s))  Culture, blood (routine x 2)     Status: Abnormal   Collection Time: 04/02/17 12:36 PM  Result Value Ref Range Status   Specimen Description   Final    BLOOD LEFT ANTECUBITAL Performed at [facility], [place] [address] [place], [place] [address]    Special Requests   Final    BOTTLES DRAWN AEROBIC AND ANAEROBIC Blood Culture adequate volume Performed at [facility] [address] [place], [place] [address]    Culture  Setup Time   Final    GRAM POSITIVE COCCI AEROBIC BOTTLE ONLY CRITICAL RESULT CALLED TO, READ BACK BY AND VERIFIED WITH: Sheffield Slider [facility] 04/05/17 0726 JDW    Culture (A)  Final    MICROCOCCUS SPECIES THE SIGNIFICANCE OF ISOLATING THIS ORGANISM FROM A SINGLE SET OF BLOOD CULTURES WHEN MULTIPLE SETS ARE DRAWN IS UNCERTAIN. PLEASE NOTIFY THE MICROBIOLOGY DEPARTMENT WITHIN ONE WEEK IF SPECIATION AND SENSITIVITIES ARE REQUIRED. Performed at [facility], [place] [address], [place], [place] [address]    Report Status 04/06/2017 FINAL  Final  Culture, blood  (routine x 2)     Status: None (Preliminary result)   Collection Time: 04/02/17 12:36 PM  Result Value Ref Range Status   Specimen Description   Final    BLOOD RIGHT WRIST Performed at [facility] [address], [place], [place] [address]    Special Requests   Final    BOTTLES DRAWN AEROBIC AND ANAEROBIC Blood Culture adequate volume Performed at [facility] [address], [place], [place] [address]    Culture   Final    NO GROWTH 4 DAYS Performed at [facility], [place] [address], [place], [place] [address]    Report Status PENDING  Incomplete  Blood Culture ID Panel (Reflexed)     Status: None   Collection Time: 04/02/17 12:36 PM  Result Value Ref Range Status   Enterococcus species NOT DETECTED NOT DETECTED Final   Listeria monocytogenes NOT DETECTED NOT DETECTED Final    Comment: CRITICAL RESULT CALLED TO, READ BACK BY AND VERIFIED WITH: Renford Dills [facility] 04/05/17 0726 JDW    Staphylococcus species NOT DETECTED NOT DETECTED Final   Staphylococcus aureus NOT DETECTED NOT DETECTED Final   Streptococcus species NOT DETECTED NOT DETECTED Final   Streptococcus agalactiae NOT DETECTED NOT DETECTED Final   Streptococcus pneumoniae NOT DETECTED NOT DETECTED Final   Streptococcus pyogenes NOT DETECTED NOT DETECTED Final   Acinetobacter baumannii NOT DETECTED NOT DETECTED Final   Enterobacteriaceae species NOT DETECTED NOT DETECTED Final   Enterobacter cloacae complex NOT DETECTED NOT DETECTED Final   Escherichia coli NOT DETECTED NOT DETECTED Final   Klebsiella oxytoca NOT DETECTED NOT DETECTED Final   Klebsiella pneumoniae NOT DETECTED NOT DETECTED Final   Proteus species NOT DETECTED NOT DETECTED Final   Serratia marcescens NOT DETECTED NOT DETECTED Final   Haemophilus influenzae NOT DETECTED NOT DETECTED Final   Neisseria meningitidis NOT DETECTED NOT DETECTED Final   Pseudomonas aeruginosa NOT DETECTED NOT DETECTED Final   Candida  albicans NOT DETECTED NOT DETECTED Final   Candida glabrata NOT DETECTED NOT DETECTED Final   Candida krusei NOT DETECTED NOT DETECTED Final   Candida parapsilosis NOT DETECTED NOT DETECTED Final   Candida tropicalis NOT DETECTED NOT DETECTED Final    Comment: Performed at [facility], [place]. [address], [place],  [address]  Aerobic/Anaerobic Culture (surgical/deep wound)     Status: None (Preliminary result)   Collection Time: 04/05/17  1:34 PM  Result Value Ref Range Status   Specimen  Description   Final    ABSCESS POST FLUORO GUIDED ASPIRATION OF T10 T11 DISC Performed at [facility] [address], [place], [place] [address]    Special Requests   Final    Normal Performed at [facility], [place] [address] [place], [place] [address]    Gram Stain   Final    RARE WBC PRESENT,BOTH PMN AND MONONUCLEAR NO ORGANISMS SEEN    Culture   Final    NO GROWTH < 24 HOURS Performed at [facility], [place] [address] [place], [place] [address]    Report Status PENDING  Incomplete         Radiology Studies: Ir Fl Guided Loc Of Needle/cath Tip For Spinal Inject Rt  Result Date: 04/05/2017 INDICATION: Concern for discitis/osteomyelitis involving T10-T11. Please perform fluoroscopic guided disc aspiration. EXAM: FLUOROSCOPIC GUIDED T10-T11 DISC ASPIRATION COMPARISON:  Thoracic spine MRI - 04/01/2017 MEDICATIONS: The patient is currently admitted to the hospital and receiving intravenous antibiotics. The antibiotics were administered within an appropriate time frame prior to the initiation of the procedure. ANESTHESIA/SEDATION: Moderate (conscious) sedation was employed during this procedure. A total of Versed 3 mg and Fentanyl 150 mcg was administered intravenously. Moderate Sedation Time: 18 minutes. The patient's level of consciousness and vital signs were monitored continuously by radiology nursing throughout the procedure under my direct  supervision. CONTRAST:  None FLUOROSCOPY TIME:  2 minutes, 30 seconds (774.1 mGy) COMPLICATIONS: None immediate. PROCEDURE: Informed written consent was obtained from the patient after a discussion of the risks, benefits and alternatives to treatment. The patient was placed prone on the fluoroscopy table and the T10-T11 intervertebral disc space was marked fluoroscopically. The procedure was planned. A timeout was performed prior to the initiation of the procedure. The skin overlying the left flank was prepped and draped in usual sterile fashion. After the overlying soft tissues were anesthetized with 1% lidocaine, an 18 gauge trocar needle was advanced into the intervertebral disc space, anterior to the left T10-T11 pedicles. Appropriate positioning was confirmed with AP and lateral projection images. Multiple spot fluoroscopic images were saved for procedural documentation purposes. A small amount of saline was administered and subsequently flushed ultimately yielding approximately 0.5 cc of blood-tinged fluid. All aspirated fluid was capped and sent to the laboratory for analysis. The needle was removed and superficial hemostasis was achieved with manual compression. A dressing was placed. The patient tolerated the procedure well without immediate postprocedural complication. IMPRESSION: Technically successful fluoroscopic guided aspiration of the T10-T11 intervertebral disc space yielding approximately 0.5 cc of blood tinged fluid. All aspirated fluid was capped and sent to the laboratory for analysis. Electronically Signed   By: Sandi Mariscal M.D.   On: 04/05/2017 14:14        Scheduled Meds: . amLODipine  10 mg Oral Daily  . atorvastatin  20 mg Oral Daily  . calcitRIOL  0.5 mcg Oral Daily  . calcium-vitamin D  1 tablet Oral Q breakfast  . dipyridamole-aspirin  1 capsule Oral BID  . heparin injection (subcutaneous)  5,000 Units Subcutaneous Q8H  . insulin aspart  0-9 Units Subcutaneous TID WC  .  levothyroxine  137 mcg Oral QAC breakfast  . pregabalin  75 mg Oral BID  . senna-docusate  1 tablet Oral BID   Continuous Infusions: . sodium chloride 75 mL/hr at 04/06/17 0844  . DAPTOmycin (CUBICIN)  IV       LOS: 4 days    Time spent: 35 minutes  Irine Seal, MD [facility] Pager [phone]  If 7PM-7AM, please contact night-coverage [email or web address] Password [facility] 04/06/2017, 12:04 PM

## 2017-04-07 DIAGNOSIS — Z5181 Encounter for therapeutic drug level monitoring: Secondary | ICD-10-CM

## 2017-04-07 DIAGNOSIS — N189 Chronic kidney disease, unspecified: Secondary | ICD-10-CM

## 2017-04-07 LAB — BASIC METABOLIC PANEL
Anion gap: 8 (ref 5–15)
BUN: 24 mg/dL — AB (ref 6–20)
CO2: 28 mmol/L (ref 22–32)
CREATININE: 1.43 mg/dL — AB (ref 0.44–1.00)
Calcium: 8.6 mg/dL — ABNORMAL LOW (ref 8.9–10.3)
Chloride: 106 mmol/L (ref 101–111)
GFR calc Af Amer: 40 mL/min — ABNORMAL LOW (ref >60)
GFR, EST NON AFRICAN AMERICAN: 34 mL/min — AB (ref >60)
GLUCOSE: 127 mg/dL — AB (ref 65–99)
POTASSIUM: 4.3 mmol/L (ref 3.5–5.1)
Sodium: 142 mmol/L (ref 135–145)

## 2017-04-07 LAB — CBC WITH DIFFERENTIAL/PLATELET
Basophils Absolute: 0 10*3/uL (ref 0.0–0.1)
Basophils Relative: 0 %
EOS ABS: 0.2 10*3/uL (ref 0.0–0.7)
EOS PCT: 2 %
HCT: 28.3 % — ABNORMAL LOW (ref 36.0–46.0)
Hemoglobin: 9.1 g/dL — ABNORMAL LOW (ref 12.0–15.0)
LYMPHS ABS: 2 10*3/uL (ref 0.7–4.0)
LYMPHS PCT: 17 %
MCH: 30.6 pg (ref 26.0–34.0)
MCHC: 32.2 g/dL (ref 30.0–36.0)
MCV: 95.3 fL (ref 78.0–100.0)
MONO ABS: 0.7 10*3/uL (ref 0.1–1.0)
MONOS PCT: 6 %
Neutro Abs: 8.7 10*3/uL — ABNORMAL HIGH (ref 1.7–7.7)
Neutrophils Relative %: 75 %
PLATELETS: 278 10*3/uL (ref 150–400)
RBC: 2.97 MIL/uL — AB (ref 3.87–5.11)
RDW: 13.5 % (ref 11.5–15.5)
WBC: 11.6 10*3/uL — ABNORMAL HIGH (ref 4.0–10.5)

## 2017-04-07 LAB — CULTURE, BLOOD (ROUTINE X 2)
CULTURE: NO GROWTH
SPECIAL REQUESTS: ADEQUATE

## 2017-04-07 LAB — CK: Total CK: 43 U/L (ref 38–234)

## 2017-04-07 LAB — HEMOGLOBIN A1C
Hgb A1c MFr Bld: 6.6 % — ABNORMAL HIGH (ref 4.8–5.6)
Mean Plasma Glucose: 142.72 mg/dL

## 2017-04-07 LAB — GLUCOSE, CAPILLARY
GLUCOSE-CAPILLARY: 143 mg/dL — AB (ref 65–99)
Glucose-Capillary: 125 mg/dL — ABNORMAL HIGH (ref 65–99)
Glucose-Capillary: 181 mg/dL — ABNORMAL HIGH (ref 65–99)
Glucose-Capillary: 131 mg/dL — ABNORMAL HIGH (ref 65–99)

## 2017-04-07 MED ORDER — METHOCARBAMOL 500 MG PO TABS
500.0000 mg | ORAL_TABLET | Freq: Four times a day (QID) | ORAL | Status: DC | PRN
  Administered 2017-04-07 – 2017-04-09 (×4): 500 mg via ORAL
  Filled 2017-04-07 (×4): qty 1

## 2017-04-07 MED ORDER — SODIUM CHLORIDE 0.9 % IV SOLN
INTRAVENOUS | Status: AC
  Administered 2017-04-07 – 2017-04-08 (×2): via INTRAVENOUS

## 2017-04-07 MED ORDER — SORBITOL 70 % SOLN
960.0000 mL | TOPICAL_OIL | Freq: Once | ORAL | Status: AC
  Administered 2017-04-07: 960 mL via RECTAL
  Filled 2017-04-07: qty 473

## 2017-04-07 NOTE — Progress Notes (Signed)
PROGRESS NOTE    [name]  JSH:[id number] DOB: [birth year] DOA: 04/02/2017 PCP: Maurice Small, MD    Brief Narrative:  Amber Rother Gaineyis a 77 y.o.femalewith medical history significant ofchronic back pain patient went to see her primary care physician who ordered a MRI of her thoracic spine which showed. Endplate changes in fluid in the disc space at T10-11 is highly concerning for disc osteomyelitis. Patient was unable to get an MRI with contrast in [facility] as admission was too small for her. She denied any nausea vomiting diarrhea constipation fever chills. She denied any lower extremity weakness. She has chronic lower extremity swelling. She lives alone. Her daughter is at the bedside. No urinary complaints no frequency burning or incontinence. She has subsequently underwent fluoroscopic guided aspiration of the thoracic spine by interventional radiology on 04/05/2017.  Patient on IV daptomycin.  Levaquin added to patient's regimen.     Assessment & Plan:   Principal Problem:   Thoracic discitis Active Problems:   Hypothyroidism   DM2 (diabetes mellitus, type 2) (HCC)   Benign essential HTN   HTN (hypertension)   THYROID CANCER, HX OF   Osteomyelitis (HCC)   Peripheral neuropathy   Status post lumbar spinal fusion   Recurrent UTI   Chronic renal insufficiency   Normocytic anemia   Constipation  1 suspected thoracic T10-T11 discitis/osteomyelitis Questionable etiology.  Patient status post fluoroscopic guided aspiration 04/05/2017 per Dr. Pascal Lux results of cultures pending with no growth to date.  IV vancomycin was started after the procedure however this has been changed to IV daptomycin in light of renal function per ID.  Levaquin added to regimen.  Per ID patient likely require 6 weeks of IV antibiotics.  We will ask interventional radiology to place central PICC line for home IV antibiotics on discharge.  ID following.  2.  Acute chronic kidney injury on  chronic kidney disease stage III Baseline creatinine 1.3.  Creatinine on admission was 1.8 and currently down to 1.43 close to baseline.  Continue gentle hydration for the next 24 hours.  Continue calcitriol.  3.  Constipation Patient complain of significant constipation.  Patient was placed on a bowel regimen of MiraLAX twice daily as well as Senokot S2 twice daily.  Patient received a SMOG enema yesterday with some bowel movements.  Patient still with some upper abdominal discomfort.  We will give another SMOG enema.  Follow.   4.  Chronic normocytic anemia Patient with no signs or symptoms of bleeding.  Hemoglobin currently at 9.1.  Follow H&H.  5.  Chronic systolic heart failure with reduced EF of 35-40% Compensated.  Patient with grade 1 diastolic dysfunction EF 85-02%.   6.  History of spinal stenosis of the lumbar region status post surgery x2 Prior history of spinal epidural abscess approximately 9 years ago.  7.  History of thyroid carcinoma/postsurgical hypothyroidism Continue home dose Synthroid.  8.  Hypertension Norvasc.   9.  Well-controlled type 2 diabetes mellitus Hemoglobin A1c 6.6.  CBGs ranging from 125-181.  Continue sliding scale insulin.   10.  Morbid obesity Weight loss.  11.  Hyperlipidemia Statin has been discontinued secondary to possible interactions with daptomycin.   DVT prophylaxis: Heparin Code Status: Full Family Communication: Updated patient and family at bedside. Disposition Plan: Home once aspiration results are finalized, pain managed, and per ID.   Consultants:   Infectious disease: Dr. Michel Bickers 04/03/2017  Procedures:   Fluoroscopic guided T10-T11 disc aspiration per IR, Dr. Pascal Lux 04/05/2017  Antimicrobials:   Oral Levaquin for 03/2017  IV daptomycin 04/06/2017  IV vancomycin 04/02/2017>>>>> 04/05/2017   Subjective: Patient eating lunch.  Patient states some improvement with upper abdominal discomfort after having multiple  bowel movements.  Some improvement with back pain.  No chest pain.  No shortness of breath.   Objective: Vitals:   04/06/17 0528 04/06/17 1347 04/06/17 2106 04/07/17 0648  BP: (!) 118/53 (!) 109/50 (!) 145/60 (!) 146/62  Pulse: (!) 59 65 65 65  Resp: 20 20 20 19   Temp: 98 F (36.7 C) 97.9 F (36.6 C) 97.9 F (36.6 C) 98.1 F (36.7 C)  TempSrc: Oral Oral Oral Oral  SpO2: 97% 99% 100% 99%  Weight:      Height:        Intake/Output Summary (Last 24 hours) at 04/07/2017 1305 Last data filed at 04/07/2017 0752 Gross per 24 hour  Intake 1845 ml  Output 2600 ml  Net -755 ml   Filed Weights   04/02/17 1333  Weight: 113.4 kg (250 lb)    Examination:  General exam: NAD Respiratory system: Lungs clear to auscultation bilaterally.  No wheezes, no crackles, no rhonchi.  Respiratory effort normal. Cardiovascular system: Regular rate rhythm no murmurs rubs or gallops.  No JVD.  No lower extremity edema.  Gastrointestinal system: Abdomen is soft, nondistended, decreasing tenderness to palpation in the epigastrium and right upper quadrant.  No rebound.  No guarding.  Central nervous system: Alert and oriented. No focal neurological deficits. Extremities: Symmetric 5 x 5 power. Skin: No rashes, lesions or ulcers Psychiatry: Judgement and insight appear normal. Mood & affect appropriate.     Data Reviewed: I have personally reviewed following labs and imaging studies  CBC: Recent Labs  Lab 04/02/17 0823 04/04/17 0601 04/05/17 0603 04/06/17 0609 04/07/17 0600  WBC 10.4 10.3 9.8 11.8* 11.6*  NEUTROABS 8.0*  --   --   --  8.7*  HGB 9.2* 9.1* 9.3* 8.8* 9.1*  HCT 29.0* 28.9* 28.5* 27.2* 28.3*  MCV 97.6 95.7 95.0 96.8 95.3  PLT 339 297 286 285 532   Basic Metabolic Panel: Recent Labs  Lab 04/02/17 0823 04/05/17 0603 04/06/17 0609 04/07/17 0600  NA 142 141 138 142  K 4.0 3.7 4.1 4.3  CL 104 103 103 106  CO2 28 29 25 28   GLUCOSE 149* 135* 140* 127*  BUN 27* 18 25* 24*    CREATININE 1.62* 1.27* 1.36* 1.43*  CALCIUM 9.1 8.8* 8.5* 8.6*   GFR: Estimated Creatinine Clearance: 40.7 mL/min (A) (by C-G formula based on SCr of 1.43 mg/dL (H)). Liver Function Tests: Recent Labs  Lab 04/02/17 0823  AST 14*  ALT 11*  ALKPHOS 48  BILITOT 0.4  PROT 7.1  ALBUMIN 3.9   No results for input(s): LIPASE, AMYLASE in the last 168 hours. No results for input(s): AMMONIA in the last 168 hours. Coagulation Profile: Recent Labs  Lab 04/04/17 0601  INR 1.02   Cardiac Enzymes: Recent Labs  Lab 04/07/17 0600  CKTOTAL 43   BNP (last 3 results) No results for input(s): PROBNP in the last 8760 hours. HbA1C: Recent Labs    04/07/17 0600  HGBA1C 6.6*   CBG: Recent Labs  Lab 04/06/17 1200 04/06/17 1709 04/06/17 2113 04/07/17 0739 04/07/17 1137  GLUCAP 131* 127* 135* 125* 181*   Lipid Profile: No results for input(s): CHOL, HDL, LDLCALC, TRIG, CHOLHDL, LDLDIRECT in the last 72 hours. Thyroid Function Tests: No results for input(s): TSH, T4TOTAL, FREET4, T3FREE, THYROIDAB  in the last 72 hours. Anemia Panel: Recent Labs    04/06/17 0849  VITAMINB12 469  FOLATE 19.5  FERRITIN 60  TIBC 251  IRON 35   Sepsis Labs: Recent Labs  Lab 04/02/17 0851  LATICACIDVEN 0.73    Recent Results (from the past 240 hour(s))  Culture, blood (routine x 2)     Status: Abnormal   Collection Time: 04/02/17 12:36 PM  Result Value Ref Range Status   Specimen Description   Final    BLOOD LEFT ANTECUBITAL Performed at [facility], [place] [address], [place], [place] [address]    Special Requests   Final    BOTTLES DRAWN AEROBIC AND ANAEROBIC Blood Culture adequate volume Performed at [place] [address] [place], [place] [address]    Culture  Setup Time   Final    GRAM POSITIVE COCCI AEROBIC BOTTLE ONLY CRITICAL RESULT CALLED TO, READ BACK BY AND VERIFIED WITH: Sheffield Slider [facility] 04/05/17 0726 JDW    Culture (A)  Final     MICROCOCCUS SPECIES THE SIGNIFICANCE OF ISOLATING THIS ORGANISM FROM A SINGLE SET OF BLOOD CULTURES WHEN MULTIPLE SETS ARE DRAWN IS UNCERTAIN. PLEASE NOTIFY THE MICROBIOLOGY DEPARTMENT WITHIN ONE WEEK IF SPECIATION AND SENSITIVITIES ARE REQUIRED. Performed at [facility], [place] [address] [place], [place] [address]    Report Status 04/06/2017 FINAL  Final  Culture, blood (routine x 2)     Status: None   Collection Time: 04/02/17 12:36 PM  Result Value Ref Range Status   Specimen Description   Final    BLOOD RIGHT WRIST Performed at [facility] [address], [place], [place] [address]    Special Requests   Final    BOTTLES DRAWN AEROBIC AND ANAEROBIC Blood Culture adequate volume Performed at [place] [address], [place], [place] [address]    Culture   Final    NO GROWTH 5 DAYS Performed at [facility], [place] [address] [place], [place] [address]    Report Status 04/07/2017 FINAL  Final  Blood Culture ID Panel (Reflexed)     Status: None   Collection Time: 04/02/17 12:36 PM  Result Value Ref Range Status   Enterococcus species NOT DETECTED NOT DETECTED Final   Listeria monocytogenes NOT DETECTED NOT DETECTED Final    Comment: CRITICAL RESULT CALLED TO, READ BACK BY AND VERIFIED WITH: Renford Dills [facility] 04/05/17 0726 JDW    Staphylococcus species NOT DETECTED NOT DETECTED Final   Staphylococcus aureus NOT DETECTED NOT DETECTED Final   Streptococcus species NOT DETECTED NOT DETECTED Final   Streptococcus agalactiae NOT DETECTED NOT DETECTED Final   Streptococcus pneumoniae NOT DETECTED NOT DETECTED Final   Streptococcus pyogenes NOT DETECTED NOT DETECTED Final   Acinetobacter baumannii NOT DETECTED NOT DETECTED Final   Enterobacteriaceae species NOT DETECTED NOT DETECTED Final   Enterobacter cloacae complex NOT DETECTED NOT DETECTED Final   Escherichia coli NOT DETECTED NOT DETECTED Final   Klebsiella oxytoca NOT DETECTED  NOT DETECTED Final   Klebsiella pneumoniae NOT DETECTED NOT DETECTED Final   Proteus species NOT DETECTED NOT DETECTED Final   Serratia marcescens NOT DETECTED NOT DETECTED Final   Haemophilus influenzae NOT DETECTED NOT DETECTED Final   Neisseria meningitidis NOT DETECTED NOT DETECTED Final   Pseudomonas aeruginosa NOT DETECTED NOT DETECTED Final   Candida albicans NOT DETECTED NOT DETECTED Final   Candida glabrata NOT DETECTED NOT DETECTED Final   Candida  krusei NOT DETECTED NOT DETECTED Final   Candida parapsilosis NOT DETECTED NOT DETECTED Final   Candida tropicalis NOT DETECTED NOT DETECTED Final    Comment: Performed at [facility], [place] [address], [place], [place] [address]  Aerobic/Anaerobic Culture (surgical/deep wound)     Status: None (Preliminary result)   Collection Time: 04/05/17  1:34 PM  Result Value Ref Range Status   Specimen Description   Final    ABSCESS POST FLUORO GUIDED ASPIRATION OF T10 T11 DISC Performed at [facility] [address] [place], [place] [address]    Special Requests   Final    Normal Performed at [facility], [place] [address] [place], [place] [address]    Gram Stain   Final    RARE WBC PRESENT,BOTH PMN AND MONONUCLEAR NO ORGANISMS SEEN    Culture   Final    NO GROWTH 2 DAYS Performed at [facility], [place] [address], [place],  [address]    Report Status PENDING  Incomplete         Radiology Studies: Ir Fl Guided Loc Of Needle/cath Tip For Spinal Inject Rt  Result Date: 04/05/2017 INDICATION: Concern for discitis/osteomyelitis involving T10-T11. Please perform fluoroscopic guided disc aspiration. EXAM: FLUOROSCOPIC GUIDED T10-T11 DISC ASPIRATION COMPARISON:  Thoracic spine MRI - 04/01/2017 MEDICATIONS: The patient is currently admitted to the hospital and receiving intravenous antibiotics. The antibiotics were administered within an appropriate time frame prior to the initiation  of the procedure. ANESTHESIA/SEDATION: Moderate (conscious) sedation was employed during this procedure. A total of Versed 3 mg and Fentanyl 150 mcg was administered intravenously. Moderate Sedation Time: 18 minutes. The patient's level of consciousness and vital signs were monitored continuously by radiology nursing throughout the procedure under my direct supervision. CONTRAST:  None FLUOROSCOPY TIME:  2 minutes, 30 seconds (932.6 mGy) COMPLICATIONS: None immediate. PROCEDURE: Informed written consent was obtained from the patient after a discussion of the risks, benefits and alternatives to treatment. The patient was placed prone on the fluoroscopy table and the T10-T11 intervertebral disc space was marked fluoroscopically. The procedure was planned. A timeout was performed prior to the initiation of the procedure. The skin overlying the left flank was prepped and draped in usual sterile fashion. After the overlying soft tissues were anesthetized with 1% lidocaine, an 18 gauge trocar needle was advanced into the intervertebral disc space, anterior to the left T10-T11 pedicles. Appropriate positioning was confirmed with AP and lateral projection images. Multiple spot fluoroscopic images were saved for procedural documentation purposes. A small amount of saline was administered and subsequently flushed ultimately yielding approximately 0.5 cc of blood-tinged fluid. All aspirated fluid was capped and sent to the laboratory for analysis. The needle was removed and superficial hemostasis was achieved with manual compression. A dressing was placed. The patient tolerated the procedure well without immediate postprocedural complication. IMPRESSION: Technically successful fluoroscopic guided aspiration of the T10-T11 intervertebral disc space yielding approximately 0.5 cc of blood tinged fluid. All aspirated fluid was capped and sent to the laboratory for analysis. Electronically Signed   By: Sandi Mariscal M.D.   On:  04/05/2017 14:14        Scheduled Meds: . amLODipine  10 mg Oral Daily  . calcitRIOL  0.5 mcg Oral Daily  . calcium-vitamin D  1 tablet Oral Q breakfast  . dipyridamole-aspirin  1 capsule Oral BID  . heparin injection (subcutaneous)  5,000 Units Subcutaneous Q8H  . insulin aspart  0-9 Units  Subcutaneous TID WC  . levofloxacin  750 mg Oral Q48H  . levothyroxine  137 mcg Oral QAC breakfast  . polyethylene glycol  17 g Oral BID  . pregabalin  75 mg Oral BID  . senna-docusate  1 tablet Oral BID  . simethicone  160 mg Oral TID   Continuous Infusions: . DAPTOmycin (CUBICIN)  IV Stopped (04/06/17 1539)     LOS: 5 days    Time spent: 35 minutes    Irine Seal, MD [facility] Pager [phone]  If 7PM-7AM, please contact night-coverage [email or web address] Password [email or web address] 04/07/2017, 1:05 PM

## 2017-04-07 NOTE — Progress Notes (Signed)
PHARMACY CONSULT NOTE FOR:  OUTPATIENT  PARENTERAL ANTIBIOTIC THERAPY (OPAT)  Indication: disc osteomyelitis Regimen: Daptomycin 500mg  IV q24 End date: 05/16/17  IV antibiotic discharge orders are pended. To discharging provider:  please sign these orders via discharge navigator,  Select New Orders & click on the button choice - Manage This Unsigned Work.     Thank you for allowing pharmacy to be a part of this patient's care.  Kara Mead 04/07/2017, 8:08 PM

## 2017-04-07 NOTE — Consult Note (Signed)
Chief Complaint: Patient was seen in consultation today for discitis  Referring Physician(s): Dr. Scharlene Gloss  Supervising Physician: Marybelle Killings  Patient Status: [facility] - In-pt  History of Present Illness: [name] is a 77 y.o. female with past medical history of DM, GERD, HLD, HTN who presented to [facility] ED with back pain.   MR Thoracic Spine 04/01/17: 1. Endplate changes in fluid in the disc space at T10-11 is highly concerning for disc osteomyelitis. Follow-up MRI of the thoracic spine with contrast could be used to better define the extent of infection. 2. Slight retrolisthesis is present at T10-11 with mild central canal stenosis. 3. Moderate foraminal stenosis bilaterally at T10-11. 4. No other focal disc protrusion or stenosis.  She underwent aspiration by Dr. Pascal Lux which has had NGTD.  IR now consulted for tunneled central venous catheter for home antibiotics.  Patient has CKD and midline access has been requested.   Past Medical History:  Diagnosis Date  . Anemia   . Asthma   . Chest pain, atypical   . Diabetes mellitus   . GERD (gastroesophageal reflux disease)   . History of transient ischemic attack (TIA)   . Hyperlipidemia   . Hypertension   . OA (osteoarthritis)   . Thyroid carcinoma [facility])     Past Surgical History:  Procedure Laterality Date  . BACK SURGERY    . BREAST BIOPSY    . CARDIAC CATHETERIZATION  03/18/2009   NORMAL LEFT VENTRICULAR SIZE AND CONTRACTILITY WITH NORMAL SYSTOLIC  FUNCTION. EF 60%  . CARDIOLITE STUDY     SHOWED A SIGNIFICANT REVERSIBLE ANTERIOR WALL DEFECT CONSISTENT WITH ISCHEMIA. EF 55%  . FEMUR IM NAIL Right 04/25/2015   Procedure: INTRAMEDULLARY (IM) RIGHT  RETROGRADE FEMORAL NAILING;  Surgeon: Rod Can, MD;  Location: [facility] ORS;  Service: Orthopedics;  Laterality: Right;  . IR FL GUIDED LOC OF NEEDLE/CATH TIP FOR SPINAL INJECTION RT  04/05/2017  . LUMBAR FUSION    . THYROIDECTOMY    . TOTAL KNEE ARTHROPLASTY     right    Allergies: Actos [pioglitazone hydrochloride]; Ativan [lorazepam]; Cephalexin; Oxycodone; Rosiglitazone; and Tramadol  Medications: Prior to Admission medications   Medication Sig Start Date End Date Taking? Authorizing Provider  amLODipine (NORVASC) 10 MG tablet Take 10 mg by mouth daily.   Yes [provider]  atorvastatin (LIPITOR) 20 MG tablet Take 20 mg by mouth daily.   Yes [provider]  calcitRIOL (ROCALTROL) 0.25 MCG capsule Take 0.5 mcg by mouth daily.   Yes [provider]  calcium-vitamin D (OSCAL WITH D) 500-200 MG-UNIT per tablet Take 1 tablet by mouth daily with breakfast.   Yes [provider]  dipyridamole-aspirin (AGGRENOX) 25-200 MG per 12 hr capsule Take 1 capsule by mouth 2 (two) times daily.    Yes [provider]  fluticasone (FLONASE) 50 MCG/ACT nasal spray Place 1 spray into the nose daily as needed for allergies.  12/20/12  Yes [provider]  furosemide (LASIX) 20 MG tablet Take 20 mg by mouth every other day.  01/27/17  Yes [provider]  HUMULIN N 100 UNIT/ML injection Inject 10 mg into the skin daily before breakfast.  12/02/16  Yes [provider]  hydrochlorothiazide 25 MG tablet Take 25 mg by mouth daily.     Yes [provider]  levothyroxine (SYNTHROID, LEVOTHROID) 137 MCG tablet Take 137 mcg by mouth daily before breakfast.   Yes [provider]  lisinopril (PRINIVIL,ZESTRIL) 40 MG tablet  Take 20 mg by mouth 2 (two) times daily.    Yes [provider]  LYRICA 75 MG capsule Take 75 mg by mouth 2 (two) times daily. 03/22/17  Yes [provider]  meclizine (ANTIVERT) 12.5 MG tablet Take 1 tablet (12.5 mg total) by mouth 3 (three) times daily as needed for dizziness. 02/09/17  Yes Quentin Cornwall, [place] N, PA-C  montelukast (SINGULAIR) 10 MG tablet Take 10 mg by mouth daily as needed. 01/27/17  Yes [provider]  nystatin ointment  (MYCOSTATIN) Apply 1 application topically 2 (two) times daily as needed for itching. 03/09/17  Yes [provider]  trimethoprim (TRIMPEX) 100 MG tablet Take 100 mg by mouth daily. 12/13/16  Yes [provider]  valACYclovir (VALTREX) 1000 MG tablet TAKE 1000 MG BY MOUTH EVERY 12 HOURS AS NEEDED FOR OUTBREAK ON DAY 1 07/05/14  Yes [provider]  acetaminophen (TYLENOL) 325 MG tablet Take 2 tablets (650 mg total) by mouth every 6 (six) hours as needed for mild pain, fever or headache (or Fever >/= 101). Patient not taking: Reported on 07/24/2015 04/29/15   Debbe Odea, MD  aspirin 81 MG chewable tablet Chew 1 tablet (81 mg total) by mouth 2 (two) times daily. Patient not taking: Reported on 07/24/2015 04/29/15   Debbe Odea, MD  bisacodyl (DULCOLAX) 10 MG suppository Place 1 suppository (10 mg total) rectally daily as needed for moderate constipation. Patient not taking: Reported on 07/24/2015 04/29/15   Debbe Odea, MD  cephALEXin (KEFLEX) 500 MG capsule Take 1 capsule (500 mg total) by mouth 2 (two) times daily. Patient not taking: Reported on 02/08/2017 08/11/16   Gean Birchwood, DPM  cephALEXin (KEFLEX) 500 MG capsule Take 1 capsule (500 mg total) by mouth 4 (four) times daily. Patient not taking: Reported on 02/08/2017 08/11/16   Gean Birchwood, DPM  docusate sodium (COLACE) 100 MG capsule Take 1 capsule (100 mg total) by mouth 2 (two) times daily. Patient not taking: Reported on 07/24/2015 04/29/15   Debbe Odea, MD  levofloxacin (LEVAQUIN) 750 MG tablet Take 1 tablet (750 mg total) by mouth every other day. Patient not taking: Reported on 07/24/2015 04/30/15   Debbe Odea, MD  methocarbamol (ROBAXIN) 500 MG tablet Take 1 tablet (500 mg total) by mouth every 6 (six) hours as needed for muscle spasms. Patient not taking: Reported on 07/24/2015 04/29/15   Debbe Odea, MD  oxyCODONE (OXY IR/ROXICODONE) 5 MG immediate release tablet Take 1 tablet (5 mg total) by mouth  every 8 (eight) hours as needed for severe pain. Patient not taking: Reported on 07/24/2015 04/29/15   Debbe Odea, MD  senna (SENOKOT) 8.6 MG TABS tablet Take 1 tablet (8.6 mg total) by mouth 2 (two) times daily. Patient not taking: Reported on 07/24/2015 04/29/15   Debbe Odea, MD  escitalopram (LEXAPRO) 10 MG tablet Take 10 mg by mouth daily.    06/28/10  [provider]     Family History  Problem Relation Age of Onset  . Pneumonia Mother   . Heart attack Father     Social History   Socioeconomic History  . Marital status: Widowed    Spouse name: Not on file  . Number of children: 5  . Years of education: Not on file  . Highest education level: Not on file  Occupational History    Employer: RETIRED  Social Needs  . Financial resource strain: Not on file  . Food insecurity:    Worry: Not on file  Inability: Not on file  . Transportation needs:    Medical: Not on file    Non-medical: Not on file  Tobacco Use  . Smoking status: Former Research scientist (life sciences)  . Smokeless tobacco: Never Used  Substance and Sexual Activity  . Alcohol use: No  . Drug use: No  . Sexual activity: Not on file  Lifestyle  . Physical activity:    Days per week: Not on file    Minutes per session: Not on file  . Stress: Not on file  Relationships  . Social connections:    Talks on phone: Not on file    Gets together: Not on file    Attends religious service: Not on file    Active member of club or organization: Not on file    Attends meetings of clubs or organizations: Not on file    Relationship status: Not on file  Other Topics Concern  . Not on file  Social History Narrative  . Not on file     Review of Systems: A 12 point ROS discussed and pertinent positives are indicated in the HPI above.  All other systems are negative.  Review of Systems  Constitutional: Negative for fatigue and fever.  Respiratory: Negative for cough and shortness of breath.   Cardiovascular: Negative for  chest pain.  Gastrointestinal: Negative for abdominal pain.  Musculoskeletal: Positive for back pain.  Psychiatric/Behavioral: Negative for behavioral problems and confusion.    Vital Signs: BP (!) 114/52 (BP Location: Left Arm)   Pulse 70   Temp 98 F (36.7 C) (Oral)   Resp 20   Ht 5\' 5"  (1.651 m)   Wt 250 lb (113.4 kg)   SpO2 99%   BMI 41.60 kg/m   Physical Exam  Constitutional: She is oriented to person, place, and time. She appears well-developed.  Cardiovascular: Normal rate, regular rhythm and normal heart sounds.  Pulmonary/Chest: Effort normal and breath sounds normal. No respiratory distress.  Abdominal: Soft.  Neurological: She is alert and oriented to person, place, and time.  Skin: Skin is warm and dry.  Psychiatric: She has a normal mood and affect. Her behavior is normal. Judgment and thought content normal.  Nursing note and vitals reviewed.    MD Evaluation Airway: WNL Heart: WNL Abdomen: WNL Chest/ Lungs: WNL ASA  Classification: 3 Mallampati/Airway Score: Two   Imaging: Mr Thoracic Spine Wo Contrast  Result Date: 04/01/2017 CLINICAL DATA:  Chronic mid back pain extending to the right. Pain extends into the abdomen. EXAM: MRI THORACIC SPINE WITHOUT CONTRAST TECHNIQUE: Multiplanar, multisequence MR imaging of the thoracic spine was performed. No intravenous contrast was administered. COMPARISON:  None. FINDINGS: Alignment: Slight retrolisthesis is present at T10-11. AP alignment is otherwise anatomic. Vertebrae: Diffuse endplate marrow changes are present at T10-11 with some loss of vertebral body height on the right and fluid in the disc space. Marrow signal and vertebral body heights are otherwise normal. Cord: Normal signal is present in the thoracic spinal cord to the conus medullaris which terminates at L1. Paraspinal and other soft tissues: Widening of the endplates is noted at U13-24 no significant soft tissue component is present. Paraspinous  musculature is unremarkable. Visualized lung fields are clear. Disc levels: There is significant endplate change and loss of vertebral body height on the right. Fluid is present in the disc space. A broad-based disc protrusion contributes to mild central canal stenosis. Endplate changes and facet hypertrophy contribute to moderate foraminal stenosis bilaterally. No other significant disc protrusion or  stenosis is evident. IMPRESSION: 1. Endplate changes in fluid in the disc space at T10-11 is highly concerning for disc osteomyelitis. Follow-up MRI of the thoracic spine with contrast could be used to better define the extent of infection. 2. Slight retrolisthesis is present at T10-11 with mild central canal stenosis. 3. Moderate foraminal stenosis bilaterally at T10-11. 4. No other focal disc protrusion or stenosis. Electronically Signed   By: San Morelle M.D.   On: 04/01/2017 13:08   Ir Fl Guided Loc Of Needle/cath Tip For Spinal Inject Rt  Result Date: 04/05/2017 INDICATION: Concern for discitis/osteomyelitis involving T10-T11. Please perform fluoroscopic guided disc aspiration. EXAM: FLUOROSCOPIC GUIDED T10-T11 DISC ASPIRATION COMPARISON:  Thoracic spine MRI - 04/01/2017 MEDICATIONS: The patient is currently admitted to the hospital and receiving intravenous antibiotics. The antibiotics were administered within an appropriate time frame prior to the initiation of the procedure. ANESTHESIA/SEDATION: Moderate (conscious) sedation was employed during this procedure. A total of Versed 3 mg and Fentanyl 150 mcg was administered intravenously. Moderate Sedation Time: 18 minutes. The patient's level of consciousness and vital signs were monitored continuously by radiology nursing throughout the procedure under my direct supervision. CONTRAST:  None FLUOROSCOPY TIME:  2 minutes, 30 seconds (540.0 mGy) COMPLICATIONS: None immediate. PROCEDURE: Informed written consent was obtained from the patient after a  discussion of the risks, benefits and alternatives to treatment. The patient was placed prone on the fluoroscopy table and the T10-T11 intervertebral disc space was marked fluoroscopically. The procedure was planned. A timeout was performed prior to the initiation of the procedure. The skin overlying the left flank was prepped and draped in usual sterile fashion. After the overlying soft tissues were anesthetized with 1% lidocaine, an 18 gauge trocar needle was advanced into the intervertebral disc space, anterior to the left T10-T11 pedicles. Appropriate positioning was confirmed with AP and lateral projection images. Multiple spot fluoroscopic images were saved for procedural documentation purposes. A small amount of saline was administered and subsequently flushed ultimately yielding approximately 0.5 cc of blood-tinged fluid. All aspirated fluid was capped and sent to the laboratory for analysis. The needle was removed and superficial hemostasis was achieved with manual compression. A dressing was placed. The patient tolerated the procedure well without immediate postprocedural complication. IMPRESSION: Technically successful fluoroscopic guided aspiration of the T10-T11 intervertebral disc space yielding approximately 0.5 cc of blood tinged fluid. All aspirated fluid was capped and sent to the laboratory for analysis. Electronically Signed   By: Sandi Mariscal M.D.   On: 04/05/2017 14:14    Labs:  CBC: Recent Labs    04/04/17 0601 04/05/17 0603 04/06/17 0609 04/07/17 0600  WBC 10.3 9.8 11.8* 11.6*  HGB 9.1* 9.3* 8.8* 9.1*  HCT 28.9* 28.5* 27.2* 28.3*  PLT 297 286 285 278    COAGS: Recent Labs    02/08/17 1244 04/04/17 0601  INR 1.04 1.02  APTT 33  --     BMP: Recent Labs    04/02/17 0823 04/05/17 0603 04/06/17 0609 04/07/17 0600  NA 142 141 138 142  K 4.0 3.7 4.1 4.3  CL 104 103 103 106  CO2 28 29 25 28   GLUCOSE 149* 135* 140* 127*  BUN 27* 18 25* 24*  CALCIUM 9.1 8.8* 8.5*  8.6*  CREATININE 1.62* 1.27* 1.36* 1.43*  GFRNONAA 29* 39* 36* 34*  GFRAA 34* 46* 42* 40*    LIVER FUNCTION TESTS: Recent Labs    02/08/17 1244 04/02/17 0823  BILITOT 0.7 0.4  AST 21 14*  ALT 15 11*  ALKPHOS 52 48  PROT 7.3 7.1  ALBUMIN 4.0 3.9    TUMOR MARKERS: No results for input(s): AFPTM, CEA, CA199, CHROMGRNA in the last 8760 hours.  Assessment and Plan: Discitis Patient with discitis requiring IV antibiotics at discharge. Patient will need a tunneled central venous catheter as she has CKD III.  She requests sedation for procedure.  Will plan to proceed tomorrow as schedule allows.  Patient to be NPO after midnight. Risks and benefits discussed with the patient including, but not limited to bleeding, infection, vascular injury, pneumothorax which may require chest tube placement, air embolism or even death  All of the patient's questions were answered, patient is agreeable to proceed. Consent signed and in chart.  Thank you for this interesting consult.  I greatly enjoyed meeting [name] and look forward to participating in their care.  A copy of this report was sent to the requesting provider on this date.  Electronically Signed: Docia Barrier, PA 04/07/2017, 4:41 PM   I spent a total of 20 Minutes    in face to face in clinical consultation, greater than 50% of which was counseling/coordinating care for discitis.

## 2017-04-07 NOTE — Progress Notes (Addendum)
[facility] for Infectious Disease   Reason for visit: Follow up on discitis  Interval History: no growth to date on biopsy; some improvement in pain; no fever, WBC 11.6.  No new complaints.  No associated n/rash/diarrhea  Physical Exam: Constitutional:  Vitals:   04/07/17 0648 04/07/17 1459  BP: (!) 146/62 (!) 114/52  Pulse: 65 70  Resp: 19 20  Temp: 98.1 F (36.7 C) 98 F (36.7 C)  SpO2: 99% 99%   patient appears in NAD Respiratory: Normal respiratory effort; CTA B Cardiovascular: RRR GI: soft, nt, nd  Review of Systems: Constitutional: negative for fevers and chills Gastrointestinal: negative for diarrhea Integument/breast: negative for rash  Lab Results  Component Value Date   WBC 11.6 (H) 04/07/2017   HGB 9.1 (L) 04/07/2017   HCT 28.3 (L) 04/07/2017   MCV 95.3 04/07/2017   PLT 278 04/07/2017    Lab Results  Component Value Date   CREATININE 1.43 (H) 04/07/2017   BUN 24 (H) 04/07/2017   NA 142 04/07/2017   K 4.3 04/07/2017   CL 106 04/07/2017   CO2 28 04/07/2017    Lab Results  Component Value Date   ALT 11 (L) 04/02/2017   AST 14 (L) 04/02/2017   ALKPHOS 48 04/02/2017     Microbiology: Recent Results (from the past 240 hour(s))  Culture, blood (routine x 2)     Status: Abnormal   Collection Time: 04/02/17 12:36 PM  Result Value Ref Range Status   Specimen Description   Final    BLOOD LEFT ANTECUBITAL Performed at [facility], [address] [place], [place] [address]    Special Requests   Final    BOTTLES DRAWN AEROBIC AND ANAEROBIC Blood Culture adequate volume Performed at [place] [address] [place], [place] [address]    Culture  Setup Time   Final    GRAM POSITIVE COCCI AEROBIC BOTTLE ONLY CRITICAL RESULT CALLED TO, READ BACK BY AND VERIFIED WITH: Sheffield Slider [facility] 04/05/17 0726 JDW    Culture (A)  Final    MICROCOCCUS SPECIES THE SIGNIFICANCE OF ISOLATING THIS ORGANISM FROM A SINGLE SET OF  BLOOD CULTURES WHEN MULTIPLE SETS ARE DRAWN IS UNCERTAIN. PLEASE NOTIFY THE MICROBIOLOGY DEPARTMENT WITHIN ONE WEEK IF SPECIATION AND SENSITIVITIES ARE REQUIRED. Performed at [facility], [place] [address] [place], [place] [address]    Report Status 04/06/2017 FINAL  Final  Culture, blood (routine x 2)     Status: None   Collection Time: 04/02/17 12:36 PM  Result Value Ref Range Status   Specimen Description   Final    BLOOD RIGHT WRIST Performed at [place] [address], [place], [place] [address]    Special Requests   Final    BOTTLES DRAWN AEROBIC AND ANAEROBIC Blood Culture adequate volume Performed at [place] [address] [place], [place] [address]    Culture   Final    NO GROWTH 5 DAYS Performed at [facility], [place] [address] [place], [place] [address]    Report Status 04/07/2017 FINAL  Final  Blood Culture ID Panel (Reflexed)     Status: None   Collection Time: 04/02/17 12:36 PM  Result Value Ref Range Status   Enterococcus species NOT DETECTED NOT DETECTED Final   Listeria monocytogenes NOT DETECTED NOT DETECTED Final    Comment: CRITICAL RESULT CALLED TO, READ BACK BY AND VERIFIED WITH: Renford Dills [facility] 04/05/17  0726 JDW    Staphylococcus species NOT DETECTED NOT DETECTED Final   Staphylococcus aureus NOT DETECTED NOT DETECTED Final   Streptococcus species NOT DETECTED NOT DETECTED Final   Streptococcus agalactiae NOT DETECTED NOT DETECTED Final   Streptococcus pneumoniae NOT DETECTED NOT DETECTED Final   Streptococcus pyogenes NOT DETECTED NOT DETECTED Final   Acinetobacter baumannii NOT DETECTED NOT DETECTED Final   Enterobacteriaceae species NOT DETECTED NOT DETECTED Final   Enterobacter cloacae complex NOT DETECTED NOT DETECTED Final   Escherichia coli NOT DETECTED NOT DETECTED Final   Klebsiella oxytoca NOT DETECTED NOT DETECTED Final   Klebsiella pneumoniae NOT DETECTED NOT DETECTED Final    Proteus species NOT DETECTED NOT DETECTED Final   Serratia marcescens NOT DETECTED NOT DETECTED Final   Haemophilus influenzae NOT DETECTED NOT DETECTED Final   Neisseria meningitidis NOT DETECTED NOT DETECTED Final   Pseudomonas aeruginosa NOT DETECTED NOT DETECTED Final   Candida albicans NOT DETECTED NOT DETECTED Final   Candida glabrata NOT DETECTED NOT DETECTED Final   Candida krusei NOT DETECTED NOT DETECTED Final   Candida parapsilosis NOT DETECTED NOT DETECTED Final   Candida tropicalis NOT DETECTED NOT DETECTED Final    Comment: Performed at [facility], [place] [address] [place], [place] [address]  Aerobic/Anaerobic Culture (surgical/deep wound)     Status: None (Preliminary result)   Collection Time: 04/05/17  1:34 PM  Result Value Ref Range Status   Specimen Description   Final    ABSCESS POST FLUORO GUIDED ASPIRATION OF T10 T11 DISC Performed at [place] [address], [place], [place] [address]    Special Requests   Final    Normal Performed at [facility], [place] [address] [place], [place] [address]    Gram Stain   Final    RARE WBC PRESENT,BOTH PMN AND MONONUCLEAR NO ORGANISMS SEEN    Culture   Final    NO GROWTH 2 DAYS Performed at [facility], [place] [address] [place], [place] [address]    Report Status PENDING  Incomplete    Impression/Plan:  1. 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  On vancomycin and oral levaquin.  Will continue for 6 weeks through May 13th. Will follow up with me in 3-4 weeks.  Labs per home health including twice weekly bmp, vancomycin trough; weekly cbc; every other week ESR, CRP Please place OPAT consult tomorrow   2.  Medication monitoring - continue to monitor creat, vancomycin trough  3. Access - will need central line access.  Ordered per IR due to chronic renal disease.

## 2017-04-08 ENCOUNTER — Encounter (HOSPITAL_COMMUNITY): Payer: Self-pay | Admitting: Interventional Radiology

## 2017-04-08 ENCOUNTER — Inpatient Hospital Stay (HOSPITAL_COMMUNITY): Payer: Medicare Other

## 2017-04-08 HISTORY — PX: IR US GUIDE VASC ACCESS RIGHT: IMG2390

## 2017-04-08 HISTORY — PX: IR FLUORO GUIDE CV LINE RIGHT: IMG2283

## 2017-04-08 LAB — GLUCOSE, CAPILLARY
GLUCOSE-CAPILLARY: 150 mg/dL — AB (ref 65–99)
GLUCOSE-CAPILLARY: 136 mg/dL — AB (ref 65–99)
GLUCOSE-CAPILLARY: 148 mg/dL — AB (ref 65–99)
Glucose-Capillary: 124 mg/dL — ABNORMAL HIGH (ref 65–99)

## 2017-04-08 LAB — CBC WITH DIFFERENTIAL/PLATELET
Basophils Absolute: 0 10*3/uL (ref 0.0–0.1)
Basophils Relative: 0 %
Eosinophils Absolute: 0.2 10*3/uL (ref 0.0–0.7)
Eosinophils Relative: 2 %
HEMATOCRIT: 27.4 % — AB (ref 36.0–46.0)
HEMOGLOBIN: 8.9 g/dL — AB (ref 12.0–15.0)
LYMPHS ABS: 1.8 10*3/uL (ref 0.7–4.0)
Lymphocytes Relative: 16 %
MCH: 31.1 pg (ref 26.0–34.0)
MCHC: 32.5 g/dL (ref 30.0–36.0)
MCV: 95.8 fL (ref 78.0–100.0)
MONOS PCT: 7 %
Monocytes Absolute: 0.8 10*3/uL (ref 0.1–1.0)
NEUTROS ABS: 8.4 10*3/uL — AB (ref 1.7–7.7)
NEUTROS PCT: 75 %
Platelets: 278 10*3/uL (ref 150–400)
RBC: 2.86 MIL/uL — ABNORMAL LOW (ref 3.87–5.11)
RDW: 13.6 % (ref 11.5–15.5)
WBC: 11.3 10*3/uL — ABNORMAL HIGH (ref 4.0–10.5)

## 2017-04-08 LAB — BASIC METABOLIC PANEL
Anion gap: 10 (ref 5–15)
BUN: 20 mg/dL (ref 6–20)
CHLORIDE: 105 mmol/L (ref 101–111)
CO2: 24 mmol/L (ref 22–32)
CREATININE: 1.35 mg/dL — AB (ref 0.44–1.00)
Calcium: 8.1 mg/dL — ABNORMAL LOW (ref 8.9–10.3)
GFR calc Af Amer: 42 mL/min — ABNORMAL LOW (ref >60)
GFR calc non Af Amer: 37 mL/min — ABNORMAL LOW (ref >60)
GLUCOSE: 132 mg/dL — AB (ref 65–99)
Potassium: 4 mmol/L (ref 3.5–5.1)
Sodium: 139 mmol/L (ref 135–145)

## 2017-04-08 LAB — T4, FREE: Free T4: 1.04 ng/dL (ref 0.61–1.12)

## 2017-04-08 LAB — TSH: TSH: 1.011 u[IU]/mL (ref 0.350–4.500)

## 2017-04-08 MED ORDER — FENTANYL CITRATE (PF) 100 MCG/2ML IJ SOLN
INTRAMUSCULAR | Status: AC
  Filled 2017-04-08: qty 4

## 2017-04-08 MED ORDER — MIDAZOLAM HCL 2 MG/2ML IJ SOLN
INTRAMUSCULAR | Status: AC
  Filled 2017-04-08: qty 4

## 2017-04-08 MED ORDER — LIDOCAINE HCL 1 % IJ SOLN
INTRAMUSCULAR | Status: AC
  Filled 2017-04-08: qty 20

## 2017-04-08 MED ORDER — DAPTOMYCIN 500 MG IV SOLR
650.0000 mg | INTRAVENOUS | Status: DC
  Administered 2017-04-08 – 2017-04-09 (×2): 650 mg via INTRAVENOUS
  Filled 2017-04-08 (×2): qty 13

## 2017-04-08 MED ORDER — LIDOCAINE HCL 1 % IJ SOLN
INTRAMUSCULAR | Status: AC | PRN
  Administered 2017-04-08: 5 mL via INTRADERMAL

## 2017-04-08 MED ORDER — SODIUM CHLORIDE 0.9% FLUSH
10.0000 mL | INTRAVENOUS | Status: DC | PRN
  Administered 2017-04-09: 10 mL
  Filled 2017-04-08: qty 40

## 2017-04-08 NOTE — Progress Notes (Signed)
PHARMACY CONSULT NOTE FOR:  OUTPATIENT  PARENTERAL ANTIBIOTIC THERAPY (OPAT)  Indication: disc osteomyelitis Regimen: Daptomycin 650mg  IV q24 End date: 05/16/17  IV antibiotic discharge orders are pended. To discharging provider:  please sign these orders via discharge navigator,  Select New Orders & click on the button choice - Manage This Unsigned Work.     Thank you for allowing pharmacy to be a part of this patient's care.  Biagio Borg 04/08/2017, 8:03 AM

## 2017-04-08 NOTE — Progress Notes (Signed)
[facility] PROGRESS NOTE  [name] BUL:[id number] DOB: 09-Mar-1940 DOA: 04/02/2017  PCP: Lujean Amel, MD  Brief History/Interval Summary: Amber Felling Gaineyis a 77 y.o.femalewith medical history significant ofchronic back pain patient went to see her primary care physician who ordered a MRI of her thoracic spine which showed Endplate changes in fluid in the disc space at T10-11 is highly concerning for disc osteomyelitis. She subsequently underwent fluoroscopic guided aspiration of the thoracic spine by interventional radiology on 04/05/2017.  Patient on IV daptomycin.  Levaquin added to patient's regimen.  Reason for Visit: Thoracic discitis  Consultants:   Infectious disease  Interventional radiology  Procedures:   Fluoroscopic guided T10-T11 disc aspiration per IR, Dr. Pascal Lux 04/05/2017  Tunneled PICC line placed in the right IJ  Antimicrobials:   Oral Levaquin for 03/2017  IV daptomycin 04/06/2017  IV vancomycin 04/02/2017>>>>> 04/05/2017   Subjective/Interval History: Patient states that she feels well.  Denies any significant back pain at this time but she has not moved around yet.  She had a central line placed this morning for home IV antibiotics.  ROS: Denies any nausea or vomiting  Objective:  Vital Signs  Vitals:   04/07/17 0648 04/07/17 1459 04/07/17 2121 04/08/17 0552  BP: (!) 146/62 (!) 114/52 (!) 141/55 (!) 134/46  Pulse: 65 70 69 72  Resp: 19 20 20 18   Temp: 98.1 F (36.7 C) 98 F (36.7 C) 98.2 F (36.8 C) 98.2 F (36.8 C)  TempSrc: Oral Oral Oral Oral  SpO2: 99% 99% 100% 95%  Weight:    115.1 kg (253 lb 12.8 oz)  Height:        Intake/Output Summary (Last 24 hours) at 04/08/2017 1319 Last data filed at 04/08/2017 0957 Gross per 24 hour  Intake 585 ml  Output 2800 ml  Net -2215 ml   Filed Weights   04/02/17 1333 04/08/17 0552  Weight: 113.4 kg (250 lb) 115.1 kg (253 lb 12.8 oz)    General appearance: alert, cooperative,  appears stated age and no distress Head: Normocephalic, without obvious abnormality, atraumatic Resp: clear to auscultation bilaterally Cardio: regular rate and rhythm, S1, S2 normal, no murmur, click, rub or gallop GI: soft, non-tender; bowel sounds normal; no masses,  no organomegaly Extremities: extremities normal, atraumatic, no cyanosis or edema Pulses: 2+ and symmetric Neurologic: No focal neurological deficits noted.  Lab Results:  Data Reviewed: I have personally reviewed following labs and imaging studies  CBC: Recent Labs  Lab 04/02/17 0823 04/04/17 0601 04/05/17 0603 04/06/17 0609 04/07/17 0600 04/08/17 0543  WBC 10.4 10.3 9.8 11.8* 11.6* 11.3*  NEUTROABS 8.0*  --   --   --  8.7* 8.4*  HGB 9.2* 9.1* 9.3* 8.8* 9.1* 8.9*  HCT 29.0* 28.9* 28.5* 27.2* 28.3* 27.4*  MCV 97.6 95.7 95.0 96.8 95.3 95.8  PLT 339 297 286 285 278 321    Basic Metabolic Panel: Recent Labs  Lab 04/02/17 0823 04/05/17 0603 04/06/17 0609 04/07/17 0600 04/08/17 0543  NA 142 141 138 142 139  K 4.0 3.7 4.1 4.3 4.0  CL 104 103 103 106 105  CO2 28 29 25 28 24   GLUCOSE 149* 135* 140* 127* 132*  BUN 27* 18 25* 24* 20  CREATININE 1.62* 1.27* 1.36* 1.43* 1.35*  CALCIUM 9.1 8.8* 8.5* 8.6* 8.1*    GFR: Estimated Creatinine Clearance: 43.5 mL/min (A) (by C-G formula based on SCr of 1.35 mg/dL (H)).  Liver Function Tests: Recent Labs  Lab 04/02/17 0823  AST  14*  ALT 11*  ALKPHOS 48  BILITOT 0.4  PROT 7.1  ALBUMIN 3.9    Coagulation Profile: Recent Labs  Lab 04/04/17 0601  INR 1.02    Cardiac Enzymes: Recent Labs  Lab 04/07/17 0600  CKTOTAL 43    HbA1C: Recent Labs    04/07/17 0600  HGBA1C 6.6*    CBG: Recent Labs  Lab 04/07/17 1137 04/07/17 1639 04/07/17 2119 04/08/17 0734 04/08/17 1139  GLUCAP 181* 131* 143* 124* 150*    Anemia Panel: Recent Labs    04/06/17 0849  VITAMINB12 469  FOLATE 19.5  FERRITIN 60  TIBC 251  IRON 35    Recent Results  (from the past 240 hour(s))  Culture, blood (routine x 2)     Status: Abnormal   Collection Time: 04/02/17 12:36 PM  Result Value Ref Range Status   Specimen Description   Final    BLOOD LEFT ANTECUBITAL Performed at [facility], [place] [address] [place], [place] [address]    Special Requests   Final    BOTTLES DRAWN AEROBIC AND ANAEROBIC Blood Culture adequate volume Performed at [place] [address], [place], [place] [address]    Culture  Setup Time   Final    GRAM POSITIVE COCCI AEROBIC BOTTLE ONLY CRITICAL RESULT CALLED TO, READ BACK BY AND VERIFIED WITH: [place] Slider [facility] 04/05/17 0726 JDW    Culture (A)  Final    MICROCOCCUS SPECIES THE SIGNIFICANCE OF ISOLATING THIS ORGANISM FROM A SINGLE SET OF BLOOD CULTURES WHEN MULTIPLE SETS ARE DRAWN IS UNCERTAIN. PLEASE NOTIFY THE MICROBIOLOGY DEPARTMENT WITHIN ONE WEEK IF SPECIATION AND SENSITIVITIES ARE REQUIRED. Performed at [facility], [place] [address], [place], [place] [address]    Report Status 04/06/2017 FINAL  Final  Culture, blood (routine x 2)     Status: None   Collection Time: 04/02/17 12:36 PM  Result Value Ref Range Status   Specimen Description   Final    BLOOD RIGHT WRIST Performed at [place] [address], [place], [place] [address]    Special Requests   Final    BOTTLES DRAWN AEROBIC AND ANAEROBIC Blood Culture adequate volume Performed at [place] [address], [place], [place] [address]    Culture   Final    NO GROWTH 5 DAYS Performed at [facility], [place] [address], [place],  [address]    Report Status 04/07/2017 FINAL  Final  Blood Culture ID Panel (Reflexed)     Status: None   Collection Time: 04/02/17 12:36 PM  Result Value Ref Range Status   Enterococcus species NOT DETECTED NOT DETECTED Final   Listeria monocytogenes NOT DETECTED NOT DETECTED Final    Comment: CRITICAL RESULT CALLED TO, READ BACK BY  AND VERIFIED WITH: Renford Dills [facility] 04/05/17 0726 JDW    Staphylococcus species NOT DETECTED NOT DETECTED Final   Staphylococcus aureus NOT DETECTED NOT DETECTED Final   Streptococcus species NOT DETECTED NOT DETECTED Final   Streptococcus agalactiae NOT DETECTED NOT DETECTED Final   Streptococcus pneumoniae NOT DETECTED NOT DETECTED Final   Streptococcus pyogenes NOT DETECTED NOT DETECTED Final   Acinetobacter baumannii NOT DETECTED NOT DETECTED Final   Enterobacteriaceae species NOT DETECTED NOT DETECTED Final   Enterobacter cloacae complex NOT DETECTED NOT DETECTED Final   Escherichia coli NOT DETECTED NOT DETECTED Final   Klebsiella oxytoca NOT DETECTED NOT DETECTED Final   Klebsiella pneumoniae NOT DETECTED NOT  DETECTED Final   Proteus species NOT DETECTED NOT DETECTED Final   Serratia marcescens NOT DETECTED NOT DETECTED Final   Haemophilus influenzae NOT DETECTED NOT DETECTED Final   Neisseria meningitidis NOT DETECTED NOT DETECTED Final   Pseudomonas aeruginosa NOT DETECTED NOT DETECTED Final   Candida albicans NOT DETECTED NOT DETECTED Final   Candida glabrata NOT DETECTED NOT DETECTED Final   Candida krusei NOT DETECTED NOT DETECTED Final   Candida parapsilosis NOT DETECTED NOT DETECTED Final   Candida tropicalis NOT DETECTED NOT DETECTED Final    Comment: Performed at [facility], [place] [address] [place], [place] [address]  Aerobic/Anaerobic Culture (surgical/deep wound)     Status: None (Preliminary result)   Collection Time: 04/05/17  1:34 PM  Result Value Ref Range Status   Specimen Description   Final    ABSCESS POST FLUORO GUIDED ASPIRATION OF T10 T11 DISC Performed at [place] [address], [place], [place] [address]    Special Requests   Final    Normal Performed at [facility], [place] [address], [place], [place] [address]    Gram Stain   Final    RARE WBC PRESENT,BOTH PMN AND MONONUCLEAR NO ORGANISMS SEEN     Culture   Final    NO GROWTH 3 DAYS NO ANAEROBES ISOLATED; CULTURE IN PROGRESS FOR 5 DAYS Performed at [facility], [place] [address] [place], [place] [address]    Report Status PENDING  Incomplete      Radiology Studies: Ir US Guide Vasc Access Right  Result Date: 04/08/2017 INDICATION: Discitis EXAM: TUNNELED RIGHT JUGULAR PICC LINE PLACEMENT WITH ULTRASOUND AND FLUOROSCOPIC GUIDANCE MEDICATIONS: None ANESTHESIA/SEDATION: None FLUOROSCOPY TIME:  Fluoroscopy Time: 1 minutes 42 seconds (27 mGy). COMPLICATIONS: None immediate. PROCEDURE: The patient was advised of the possible risks and complications and agreed to undergo the procedure. The patient was then brought to the angiographic suite for the procedure. The right neck was prepped with chlorhexidine, draped in the usual sterile fashion using maximum barrier technique (cap and mask, sterile gown, sterile gloves, large sterile sheet, hand hygiene and cutaneous antiseptic). Local anesthesia was attained by infiltration with 1% lidocaine. Ultrasound demonstrated patency of the right jugular vein, and this was documented with an image. Under real-time ultrasound guidance, this vein was accessed with a 21 gauge micropuncture needle and image documentation was performed. And extended subcutaneous tract was deployed. The needle was exchanged over a guidewire for a peel-away sheath through which a 20 cm 5 French tunneled double lumen power injectable PICC was advanced, and positioned with its tip at the lower SVC/right atrial junction. The cuff was positioned in the subcutaneous tract. Fluoroscopy during the procedure and fluoro spot radiograph confirms appropriate catheter position. The catheter was flushed, secured to the skin with Prolene sutures, and covered with a sterile dressing. IMPRESSION: Successful placement of a right jugular tunneled PICC with sonographic and fluoroscopic guidance. The catheter is ready for use. Electronically Signed   By:  Marybelle Killings M.D.   On: 04/08/2017 09:59     Medications:  Scheduled: . amLODipine  10 mg Oral Daily  . calcitRIOL  0.5 mcg Oral Daily  . calcium-vitamin D  1 tablet Oral Q breakfast  . dipyridamole-aspirin  1 capsule Oral BID  . heparin injection (subcutaneous)  5,000 Units Subcutaneous Q8H  . insulin aspart  0-9 Units Subcutaneous TID WC  . levofloxacin  750 mg Oral Q48H  . levothyroxine  137 mcg  Oral QAC breakfast  . lidocaine      . polyethylene glycol  17 g Oral BID  . pregabalin  75 mg Oral BID  . senna-docusate  1 tablet Oral BID  . simethicone  160 mg Oral TID   Continuous: . sodium chloride 75 mL/hr at 04/08/17 0342  . DAPTOmycin (CUBICIN)  IV     ZPH:[id number], fluticasone, HYDROcodone-acetaminophen, methocarbamol  Assessment/Plan:  Principal Problem:   Thoracic discitis Active Problems:   Hypothyroidism   DM2 (diabetes mellitus, type 2) (HCC)   Benign essential HTN   HTN (hypertension)   THYROID CANCER, HX OF   Osteomyelitis (HCC)   Peripheral neuropathy   Status post lumbar spinal fusion   Recurrent UTI   Chronic renal insufficiency   Normocytic anemia   Constipation     Thoracic T10-T11 discitis/osteomyelitis Questionable etiology.  Patient status post fluoroscopic guided aspiration 04/05/2017 by Dr. Pascal Lux. Results of cultures with no growth to date. IV vancomycin was started after the procedure however was changed to IV daptomycin in light of renal function per ID.  Levaquin added to regimen.  Per ID patient likely require 6 weeks of IV antibiotics.  Due to chronic kidney disease patient could not get extremity PICC line.  IJ PICC line was placed by interventional radiology this morning.  Acute chronic kidney injury on chronic kidney disease stage III Baseline creatinine 1.3.  Creatinine on admission was 1.8 and currently down to 1.3. this is close to her baseline.  Okay to stop her IV fluids.  Continue home medications.   Constipation Patient was  placed on a bowel regimen of MiraLAX twice daily as well as Senokot S2 twice daily.  And had bowel movements with enema.  Feels better this morning.  Outpatient follow-up.  Consider checking thyroid function test.  No recent test results in EMR.  Chronic normocytic anemia Patient with no signs or symptoms of bleeding.  Hemoglobin is stable.  Chronic systolic heart failure with reduced EF of 35-40% Compensated.  Patient with grade 1 diastolic dysfunction EF 80-16%.   History of spinal stenosis of the lumbar region status post surgery x2 Prior history of spinal epidural abscess approximately 9 years ago.  History of thyroid carcinoma/postsurgical hypothyroidism Continue home dose Synthroid.  Due to constipation we will check her thyroid function test.  Essential hypertension Her pressure is stable.  Type 2 diabetes mellitus Hemoglobin A1c 6.6.  CBGs ranging from 125-181.  Continue sliding scale insulin.   Morbid obesity Weight loss counseling provided. Body mass index is 42.23 kg/m.  Hyperlipidemia Statin has been discontinued secondary to possible interactions with daptomycin.  DVT Prophylaxis: Subcutaneous heparin    Code Status: Full code Family Communication: Discussed with the patient Disposition Plan: PT and OT evaluation.  Anticipate discharge tomorrow.    LOS: 6 days   [facility] Pager [phone] 04/08/2017, 1:19 PM  If 7PM-7AM, please contact night-coverage at [email or web address], password [email or web address]

## 2017-04-08 NOTE — Evaluation (Signed)
Occupational Therapy Evaluation Patient Details Name: [name] MRN: [id number] DOB: October 10, 1940 Today's Date: 04/08/2017    History of Present Illness This 77 year old female was admitted for thoracic discitis.  PHM:  DM, TIA, THN, OA, thyroid CA   Clinical Impression   Pt was admitted for the above.  She is very independent natured and will benefit from continued OT to restore independence and further educate on AE and energy conservation to reduce pain during ADL activities. Goals are for mod I level    Follow Up Recommendations  Home health OT    Equipment Recommendations  None recommended by OT    Recommendations for Other Services       Precautions / Restrictions Precautions Precautions: Fall Precaution Comments: back pain Restrictions Weight Bearing Restrictions: No      Mobility Bed Mobility               General bed mobility comments: oob  Transfers Overall transfer level: Needs assistance Equipment used: Rolling walker (2 wheeled) Transfers: Sit to/from Stand Sit to Stand: Min guard         General transfer comment: for safety    Balance                                           ADL either performed or assessed with clinical judgement   ADL Overall ADL's : Needs assistance/impaired Eating/Feeding: Independent   Grooming: Min guard;Standing   Upper Body Bathing: Set up;Sitting   Lower Body Bathing: Moderate assistance;Sit to/from stand   Upper Body Dressing : Set up;Sitting   Lower Body Dressing: Moderate assistance;Sit to/from stand   Toilet Transfer: Min guard;Ambulation;RW(chair)   Toileting- Water quality scientist and Hygiene: Min guard;Sit to/from stand         General ADL Comments: pt wears briefs for urgency at baseline. She has a CG a few times a week to help with showering.  Usually, she can cross her legs for donning socks. She has several reachers.  Discussed having someone don socks and keep them  on.  Also told her to be sure not to be so motivated that she pushes herself through the pain. When walking, she wanted to continue on even though she was getting shaky.  She states that she will be aware of this     Vision         Perception     Praxis      Pertinent Vitals/Pain Pain Assessment: Faces Faces Pain Scale: Hurts whole lot(when going stand to sit) Pain Location: back Pain Descriptors / Indicators: Aching Pain Intervention(s): Limited activity within patient's tolerance;Monitored during session;Premedicated before session;Repositioned     Hand Dominance     Extremity/Trunk Assessment Upper Extremity Assessment Upper Extremity Assessment: Overall WFL for tasks assessed           Communication Communication Communication: No difficulties   Cognition Arousal/Alertness: Awake/alert Behavior During Therapy: WFL for tasks assessed/performed Overall Cognitive Status: Within Functional Limits for tasks assessed                                     General Comments       Exercises     Shoulder Instructions      Home Living Family/patient expects to be discharged to:: Private residence Living  Arrangements: Alone Available Help at Discharge: Personal care attendant Type of Home: Apartment             Bathroom Shower/Tub: Walk-in Corporate treasurer Toilet: Handicapped height     Home Equipment: Environmental consultant - 2 wheels;Walker - 4 wheels;Bedside commode;Shower seat(reacher; lift chair)   Additional Comments: carillon; caregiver 3x week:  cleans, assists pt in shower; makes her own meals: simple      Prior Functioning/Environment Level of Independence: Independent with assistive device(s)                 OT Problem List: Decreased activity tolerance;Pain;Decreased knowledge of use of DME or AE      OT Treatment/Interventions: Self-care/ADL training;Energy conservation;DME and/or AE instruction;Patient/family education    OT  Goals(Current goals can be found in the care plan section) Acute Rehab OT Goals Patient Stated Goal: decreased back pain; get back to treadmill and water aerobics OT Goal Formulation: With patient Time For Goal Achievement: 04/22/17 Potential to Achieve Goals: Good ADL Goals Pt Will Transfer to Toilet: with modified independence;bedside commode;ambulating Pt Will Perform Toileting - Clothing Manipulation and hygiene: with modified independence;sit to/from stand Additional ADL Goal #1: pt will gather clothes and dress herself, with AE, if needed at mod I level  OT Frequency: Min 2X/week   Barriers to D/C:            Co-evaluation              AM-PAC PT "6 Clicks" Daily Activity     Outcome Measure Help from another person eating meals?: None Help from another person taking care of personal grooming?: A Little Help from another person toileting, which includes using toliet, bedpan, or urinal?: A Little Help from another person bathing (including washing, rinsing, drying)?: A Lot Help from another person to put on and taking off regular upper body clothing?: A Little Help from another person to put on and taking off regular lower body clothing?: A Lot 6 Click Score: 17   End of Session Nurse Communication: (new purewick)  Activity Tolerance: Patient limited by fatigue Patient left: in chair;with call bell/phone within reach;with chair alarm set  OT Visit Diagnosis: Muscle weakness (generalized) (M62.81)                Time: 5732-2025 OT Time Calculation (min): 25 min Charges:  OT General Charges $OT Visit: 1 Visit OT Evaluation $OT Eval Low Complexity: 1 Low G-Codes:     [place], OTR/L [phone] 04/08/2017  Kazuto Sevey 04/08/2017, 12:58 PM

## 2017-04-08 NOTE — Care Management Note (Signed)
Case Management Note  Patient Details  Name: [name] MRN: [id number] Date of Birth: 02/06/1940  Subjective/Objective:     77 yo admitted with Thoracic discitis.               Action/Plan: From home and has [facility] caregivers intermittently and support from her daughter. Pt offered choice for home abx IV infusion and home health services. [facility] chosen for home infusion and [facility] chosen for [facility] and [facility]. Both agency reps aware of referral. Will need MD orders for HHRN/PT for discharge. Pt states she has all the DME she needs at home.  Expected Discharge Date:                  Expected Discharge Plan:     In-House Referral:     Discharge planning Services     Post Acute Care Choice:    Choice offered to:     DME Arranged:    DME Agency:     HH Arranged:    HH Agency:     Status of Service:     If discussed at [employer], dates discussed:    Additional CommentsLynnell Catalan, RN 04/08/2017, 10:00 AM  [phone]

## 2017-04-08 NOTE — Progress Notes (Signed)
[facility] for Infectious Disease   Reason for visit: Follow up on discitis  Interval History: no growth to date    Lab Results  Component Value Date   WBC 11.3 (H) 04/08/2017   HGB 8.9 (L) 04/08/2017   HCT 27.4 (L) 04/08/2017   MCV 95.8 04/08/2017   PLT 278 04/08/2017    Lab Results  Component Value Date   CREATININE 1.35 (H) 04/08/2017   BUN 20 04/08/2017   NA 139 04/08/2017   K 4.0 04/08/2017   CL 105 04/08/2017   CO2 24 04/08/2017    Lab Results  Component Value Date   ALT 11 (L) 04/02/2017   AST 14 (L) 04/02/2017   ALKPHOS 48 04/02/2017     Microbiology: Recent Results (from the past 240 hour(s))  Culture, blood (routine x 2)     Status: Abnormal   Collection Time: 04/02/17 12:36 PM  Result Value Ref Range Status   Specimen Description   Final    BLOOD LEFT ANTECUBITAL Performed at [facility], [address], [place], [place] [address]    Special Requests   Final    BOTTLES DRAWN AEROBIC AND ANAEROBIC Blood Culture adequate volume Performed at [facility] [address], [place], [place] [address]    Culture  Setup Time   Final    GRAM POSITIVE COCCI AEROBIC BOTTLE ONLY CRITICAL RESULT CALLED TO, READ BACK BY AND VERIFIED WITH: Sheffield Slider [facility] 04/05/17 0726 JDW    Culture (A)  Final    MICROCOCCUS SPECIES THE SIGNIFICANCE OF ISOLATING THIS ORGANISM FROM A SINGLE SET OF BLOOD CULTURES WHEN MULTIPLE SETS ARE DRAWN IS UNCERTAIN. PLEASE NOTIFY THE MICROBIOLOGY DEPARTMENT WITHIN ONE WEEK IF SPECIATION AND SENSITIVITIES ARE REQUIRED. Performed at [facility], [place] [address] [place], [place] [address]    Report Status 04/06/2017 FINAL  Final  Culture, blood (routine x 2)     Status: None   Collection Time: 04/02/17 12:36 PM  Result Value Ref Range Status   Specimen Description   Final    BLOOD RIGHT WRIST Performed at [facility] [address], [place], [place] [address]    Special  Requests   Final    BOTTLES DRAWN AEROBIC AND ANAEROBIC Blood Culture adequate volume Performed at [facility] [address] [place], [place] [address]    Culture   Final    NO GROWTH 5 DAYS Performed at [facility], [place] [address], [place], [place] [address]    Report Status 04/07/2017 FINAL  Final  Blood Culture ID Panel (Reflexed)     Status: None   Collection Time: 04/02/17 12:36 PM  Result Value Ref Range Status   Enterococcus species NOT DETECTED NOT DETECTED Final   Listeria monocytogenes NOT DETECTED NOT DETECTED Final    Comment: CRITICAL RESULT CALLED TO, READ BACK BY AND VERIFIED WITH: Renford Dills [facility] 04/05/17 0726 JDW    Staphylococcus species NOT DETECTED NOT DETECTED Final   Staphylococcus aureus NOT DETECTED NOT DETECTED Final   Streptococcus species NOT DETECTED NOT DETECTED Final   Streptococcus agalactiae NOT DETECTED NOT DETECTED Final   Streptococcus pneumoniae NOT DETECTED NOT DETECTED Final   Streptococcus pyogenes NOT DETECTED NOT DETECTED Final   Acinetobacter baumannii NOT DETECTED NOT DETECTED Final   Enterobacteriaceae species NOT DETECTED NOT DETECTED Final   Enterobacter cloacae complex NOT DETECTED NOT DETECTED Final   Escherichia coli NOT DETECTED NOT DETECTED  Final   Klebsiella oxytoca NOT DETECTED NOT DETECTED Final   Klebsiella pneumoniae NOT DETECTED NOT DETECTED Final   Proteus species NOT DETECTED NOT DETECTED Final   Serratia marcescens NOT DETECTED NOT DETECTED Final   Haemophilus influenzae NOT DETECTED NOT DETECTED Final   Neisseria meningitidis NOT DETECTED NOT DETECTED Final   Pseudomonas aeruginosa NOT DETECTED NOT DETECTED Final   Candida albicans NOT DETECTED NOT DETECTED Final   Candida glabrata NOT DETECTED NOT DETECTED Final   Candida krusei NOT DETECTED NOT DETECTED Final   Candida parapsilosis NOT DETECTED NOT DETECTED Final   Candida tropicalis NOT DETECTED NOT DETECTED Final    Comment: Performed  at [facility], [place] [address] [place], [place] [address]  Aerobic/Anaerobic Culture (surgical/deep wound)     Status: None (Preliminary result)   Collection Time: 04/05/17  1:34 PM  Result Value Ref Range Status   Specimen Description   Final    ABSCESS POST FLUORO GUIDED ASPIRATION OF T10 T11 DISC Performed at [facility] [address] [place], [place] [address]    Special Requests   Final    Normal Performed at [facility], [place] [address] [place], [place] [address]    Gram Stain   Final    RARE WBC PRESENT,BOTH PMN AND MONONUCLEAR NO ORGANISMS SEEN    Culture   Final    NO GROWTH 3 DAYS NO ANAEROBES ISOLATED; CULTURE IN PROGRESS FOR 5 DAYS Performed at [facility], [place] [address] [place], [place] [address]    Report Status PENDING  Incomplete    Impression/Plan:  1. 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  On daptomycin (erroneously put vancomycin on yesterday's note) and oral levaquin.  Will continue for 6 weeks through May 13th. Will follow up with me in 3-4 weeks. I will arrange this.   Labs per home health protocol Please place OPAT consult when ready for d/c  2.  Medication monitoring - CK weekly

## 2017-04-08 NOTE — Progress Notes (Signed)
Pharmacy Antibiotic Note  RAIGAN BARIA is a 77 y.o. female admitted on 04/02/2017 with  suspected discitis/osteomyelitis T10-T11.   Pharmacy has been consulted for levaquin po in addition to the daptomycin. 04/08/2017:  Afebrile  Mild leukocytosis  Renal fxn improving to baseline, NCrCl ~77ml/min  Cx date remains negative  CK WNL  Plan: -Continue Levaquin 750mg  po Q48h for CrCl < 50 mls/min -Increase daptomycin to 8mg /kg since increased dose more representative of currently literature recommendations for discitis (660 based on adjusted body weight due to BMI = 41) -f/u cultures, CK and clinical course  Height: 5\' 5"  (165.1 cm) Weight: 253 lb 12.8 oz (115.1 kg) IBW/kg (Calculated) : 57  Temp (24hrs), Avg:98.1 F (36.7 C), Min:98 F (36.7 C), Max:98.2 F (36.8 C)  Recent Labs  Lab 04/02/17 0823 04/02/17 0851 04/04/17 0601 04/05/17 0603 04/06/17 0609 04/07/17 0600 04/08/17 0543  WBC 10.4  --  10.3 9.8 11.8* 11.6* 11.3*  CREATININE 1.62*  --   --  1.27* 1.36* 1.43* 1.35*  LATICACIDVEN  --  0.73  --   --   --   --   --     Estimated Creatinine Clearance: 43.5 mL/min (A) (by C-G formula based on SCr of 1.35 mg/dL (H)).    Allergies  Allergen Reactions  . Actos [Pioglitazone Hydrochloride] Swelling  . Ativan [Lorazepam]     swelling  . Cephalexin Other (See Comments)    Doesn't remember   . Oxycodone     This medication makes her feel sick  . Rosiglitazone     unknown  . Tramadol Other (See Comments)    hallucinations    Antimicrobials this admission: 3/30 cefepime x 1 3/30 vanc x 1>> resumed 4/2>> d/c'ed 4/2 4/3 daptomycin >> 4/3 levaquin >>  Dose adjustments this admission: 4/5 Increase daptomycin to 8mg /kg  Microbiology results: 3/30 BCx: 1/2 micrococcus 4/2 Abscess:  Thank you for allowing pharmacy to be a part of this patient's care.  Netta Cedars, PharmD, BCPS Pager: [phone] 04/08/2017, 7:49 AM

## 2017-04-08 NOTE — Evaluation (Signed)
Physical Therapy Evaluation Patient Details Name: [name] MRN: [id number] DOB: May 12, 1940 Today's Date: 04/08/2017   History of Present Illness  This 77 year old female was admitted for thoracic discitis.  PHM:  DM, TIA, THN, OA, thyroid CA  Clinical Impression  Pt admitted with above diagnosis. Pt currently with functional limitations due to the deficits listed below (see PT Problem List). Pt will benefit from HHPT at d/c, will follow in acute care to address deficits below; Pt will benefit from skilled PT to increase their independence and safety with mobility to allow discharge to the venue listed below.       Follow Up Recommendations Home health PT;Supervision - Intermittent    Equipment Recommendations  None recommended by PT;Other (comment)(? new RW-pt states her RW & rollator are broken but only a yr old (?))    Recommendations for Other Services       Precautions / Restrictions Precautions Precautions: Fall Restrictions Weight Bearing Restrictions: No      Mobility  Bed Mobility               General bed mobility comments: oob  Transfers Overall transfer level: Needs assistance Equipment used: Rolling walker (2 wheeled) Transfers: Sit to/from Stand Sit to Stand: Min guard         General transfer comment: for safety  Ambulation/Gait Ambulation/Gait assistance: Min guard Ambulation Distance (Feet): 55 Feet Assistive device: Rolling walker (2 wheeled) Gait Pattern/deviations: Step-through pattern;Decreased stride length;Trunk flexed     General Gait Details: min/guard for balance and safety, slow but steady gait, fatigues quickly  Stairs            Wheelchair Mobility    Modified Rankin (Stroke Patients Only)       Balance Overall balance assessment: Needs assistance           Standing balance-Leahy Scale: Poor Standing balance comment: reliant on UEs                              Pertinent Vitals/Pain Pain  Location: back Pain Descriptors / Indicators: Aching Pain Intervention(s): Monitored during session;Limited activity within patient's tolerance;Repositioned;Premedicated before session    Home Living Family/patient expects to be discharged to:: Private residence Living Arrangements: Alone Available Help at Discharge: Personal care attendant;Neighbor(M/T/TH) Type of Home: Apartment([place]) Home Access: Level entry     Home Layout: One level Home Equipment: Walker - 2 wheels;Walker - 4 wheels;Bedside commode;Shower seat;Adaptive equipment Additional Comments: [place]; caregiver 3x week:  cleans, assists pt in shower; makes her own meals: simple    Prior Function Level of Independence: Independent with assistive device(s)         Comments: amb with RW or rollator at baseline     Hand Dominance        Extremity/Trunk Assessment   Upper Extremity Assessment Upper Extremity Assessment: Defer to OT evaluation    Lower Extremity Assessment Lower Extremity Assessment: Overall WFL for tasks assessed(rapid muscle fatigue with mobility)       Communication   Communication: No difficulties  Cognition Arousal/Alertness: Awake/alert Behavior During Therapy: WFL for tasks assessed/performed Overall Cognitive Status: Within Functional Limits for tasks assessed                                        General Comments      Exercises  Assessment/Plan    PT Assessment Patient needs continued PT services  PT Problem List Decreased activity tolerance;Decreased mobility;Decreased balance       PT Treatment Interventions Gait training;Therapeutic activities;Functional mobility training;Patient/family education;Therapeutic exercise    PT Goals (Current goals can be found in the Care Plan section)  Acute Rehab PT Goals Patient Stated Goal: decreased back pain; get back to treadmill and water aerobics PT Goal Formulation: With patient Time For Goal  Achievement: 04/22/17 Potential to Achieve Goals: Good    Frequency Min 3X/week   Barriers to discharge        Co-evaluation PT/OT/SLP Co-Evaluation/Treatment: Yes Reason for Co-Treatment: To address functional/ADL transfers PT goals addressed during session: Mobility/safety with mobility;Proper use of DME OT goals addressed during session: ADL's and self-care       AM-PAC PT "6 Clicks" Daily Activity  Outcome Measure Difficulty turning over in bed (including adjusting bedclothes, sheets and blankets)?: A Lot Difficulty moving from lying on back to sitting on the side of the bed? : Unable Difficulty sitting down on and standing up from a chair with arms (e.g., wheelchair, bedside commode, etc,.)?: A Lot Help needed moving to and from a bed to chair (including a wheelchair)?: A Little Help needed walking in hospital room?: A Little Help needed climbing 3-5 steps with a railing? : A Lot 6 Click Score: 13    End of Session Equipment Utilized During Treatment: Gait belt Activity Tolerance: Patient tolerated treatment well Patient left: in chair;with call bell/phone within reach   PT Visit Diagnosis: Difficulty in walking, not elsewhere classified (R26.2)    Time: 9371-6967 PT Time Calculation (min) (ACUTE ONLY): 25 min   Charges:   PT Evaluation $PT Eval Low Complexity: 1 Low     PT G CodesKenyon Ana, PT Pager: [phone] 04/08/2017   [facility] 04/08/2017, 4:40 PM

## 2017-04-09 LAB — GLUCOSE, CAPILLARY
GLUCOSE-CAPILLARY: 145 mg/dL — AB (ref 65–99)
Glucose-Capillary: 139 mg/dL — ABNORMAL HIGH (ref 65–99)

## 2017-04-09 MED ORDER — METHOCARBAMOL 500 MG PO TABS: 500.0000 mg | ORAL_TABLET | Freq: Four times a day (QID) | ORAL | 0 refills | Status: DC | PRN

## 2017-04-09 MED ORDER — SIMETHICONE 80 MG PO CHEW: 160.0000 mg | CHEWABLE_TABLET | Freq: Three times a day (TID) | ORAL | 0 refills | Status: DC

## 2017-04-09 MED ORDER — POLYETHYLENE GLYCOL 3350 17 G PO PACK: 17.0000 g | PACK | Freq: Two times a day (BID) | ORAL | 0 refills | Status: DC

## 2017-04-09 MED ORDER — UNABLE TO FIND: 0 refills | Status: DC

## 2017-04-09 MED ORDER — HEPARIN SOD (PORK) LOCK FLUSH 100 UNIT/ML IV SOLN
250.0000 [IU] | INTRAVENOUS | Status: AC | PRN
  Administered 2017-04-09: 250 [IU]

## 2017-04-09 MED ORDER — SENNOSIDES-DOCUSATE SODIUM 8.6-50 MG PO TABS: 1.0000 | ORAL_TABLET | Freq: Two times a day (BID) | ORAL | 0 refills | Status: DC

## 2017-04-09 MED ORDER — LEVOFLOXACIN 750 MG PO TABS: 750.0000 mg | ORAL_TABLET | ORAL | 1 refills | Status: DC

## 2017-04-09 MED ORDER — DAPTOMYCIN IV (FOR PTA / DISCHARGE USE ONLY): 650.0000 mg | INTRAVENOUS | 0 refills | Status: AC

## 2017-04-09 MED ORDER — HYDROCODONE-ACETAMINOPHEN 5-325 MG PO TABS: 1.0000 | ORAL_TABLET | Freq: Four times a day (QID) | ORAL | 0 refills | Status: DC | PRN

## 2017-04-09 NOTE — Discharge Instructions (Signed)
Diskitis Diskitis is irritation and swelling (inflammation) of the disks in the spine. Disks are soft structures that cushion the bones of the spine. This is not a common condition. Diskitis most often affects the disks of the lower back (lumbar disks) or upper back (thoracic disks). What are the causes? A bacterial or viral infection can lead to diskitis. When diskitis develops, it is often accompanied by infection-induced inflammation of the bones (osteomyelitis) surrounding the spine. The condition can also be caused by inflammation from another condition, like an autoimmune disease. If you have an autoimmune disease, your body's defense system (immune system) mistakenly attacks your own healthy cells instead of germs and other things that can make you sick. What increases the risk? People at higher risk of developing diskitis include:  Children.  Older persons.  People with weak immune systems or immune system disorders.  People with diabetes.  People having chemotherapy.  What are the signs or symptoms? Back or stomach pain is the most common symptom of diskitis. Walking, standing, and sitting may be painful. Other symptoms may include:  Trouble standing or rising from a sitting position.  Fever lower than 102F (38.9C).  Back stiffness.  Flu-like symptoms, such as a sore throat and a runny nose.  Irritability.  Feeling pain when the affected area is touched.  How is this diagnosed? Your health care provider can diagnose diskitis based on symptoms and medical history. Your health care provider will also do a physical exam. You may also need to have blood tests or imaging studies, such as:  X-ray of the spine.  MRI of the spine.  A bone scan.  How is this treated? Treatment for diskitis may include:  Bed rest.  Medicines, such as: ? Antibiotics to treat a possible bacterial infection. ? Anti-inflammatory medicines. ? Steroids if the condition does not improve  over time. ? Pain-relieving medicines.  A brace to stop your back from moving.  Follow these instructions at home:  If you were prescribed an antibiotic medicine, finish it all even if you start to feel better.  Take medicines only as directed by your health care provider.  Keep all follow-up visits as directed by your health care provider. This is important. Contact a health care provider if:  You have difficulty walking or standing.  You have persistent back pain.  You are having side effects from medicines. This information is not intended to replace advice given to you by your health care provider. Make sure you discuss any questions you have with your health care provider. Document Released: 11/22/2003 Document Revised: 05/29/2015 Document Reviewed: 04/25/2013 Elsevier Interactive Patient Education  [employer].

## 2017-04-09 NOTE — Discharge Summary (Signed)
[facility]  Physician Discharge Summary   Patient ID: [name] MRN: [id number] DOB/AGE: 79-Aug-1942 79 y.o.  Admit date: 04/02/2017 Discharge date: 04/10/2017  PCP: Lujean Amel, MD  DISCHARGE DIAGNOSES:  Principal Problem:   Thoracic discitis Active Problems:   Hypothyroidism   DM2 (diabetes mellitus, type 2) ([place])   Benign essential HTN   HTN (hypertension)   THYROID CANCER, HX OF   Osteomyelitis (HCC)   Peripheral neuropathy   Status post lumbar spinal fusion   Recurrent UTI   Chronic renal insufficiency   Normocytic anemia   Constipation   RECOMMENDATIONS FOR OUTPATIENT FOLLOW UP: 1. Home health has been arranged. 2. Statin discontinued due to potential interaction with daptomycin 3. Amlodipine and hydrochlorothiazide held as discussed below.   DISCHARGE CONDITION: fair  Diet recommendation: As before  [facility] Weights   04/02/17 1333 04/08/17 0552  Weight: 113.4 kg (250 lb) 115.1 kg (253 lb 12.8 oz)    INITIAL HISTORY: [name] a 77 y.o.femalewith medical history significant ofchronic back pain patient went to see her primary care physician who ordered a MRI of her thoracic spine which showed Endplate changes in fluid in the disc space at T10-11 is highly concerning for disc osteomyelitis. She subsequently underwent fluoroscopic guided aspiration of the thoracic spine by interventional radiology on 04/05/2017.  Patient on IV daptomycin.  Levaquin added to patient's regimen.  Consultants:  Infectious disease  Interventional radiology  Procedures:  Fluoroscopic guided T10-T11 disc aspiration per IR, Dr. Pascal Lux 04/05/2017  Tunneled PICC line placed in the right IJ  Antimicrobials:   Oral Levaquin   IV daptomycin 04/06/2017  IV vancomycin 04/02/2017>>>>> 04/05/2017   HOSPITAL COURSE:   Thoracic T10-T11 discitis/osteomyelitis Questionable etiology.  Patient status post fluoroscopic guided aspiration 04/05/2017 by Dr. Pascal Lux.  Results of cultures with no growth to date. IV vancomycin was started after the procedure however was changed to IV daptomycin in light of renal function per ID.  Levaquin added to regimen.  Per ID patient likely require 6 weeks of IV antibiotics.  Due to chronic kidney disease patient could not get extremity PICC line.  IJ PICC line was placed by interventional radiology.  Home health has been ordered.  ID to follow patient in the clinic.  Acute chronic kidney injury on chronic kidney disease stage III Baseline creatinine 1.3.  Creatinine on admission was 1.8 and currently down to 1.3. This is close to her baseline.    Constipation Patient was placed on a bowel regimen of MiraLAX twice daily as well as Senokot twice daily.  And had bowel movements with enema.    She will be discharged on a bowel regimen.  Thyroid function tests were normal.  Outpatient follow-up with PCP.  Chronic normocytic anemia Patient with no signs or symptoms of bleeding.  Hemoglobin is stable.  Chronic systolic heart failure with reduced EF of 35-40% Compensated.  Patient with grade 1 diastolic dysfunction EF 87-56%.  Continue ACE inhibitor and her diuretic.  History of spinal stenosis of the lumbar region status post surgery x2 Prior history of spinal epidural abscess approximately 9 years ago.  History of thyroid carcinoma/postsurgical hypothyroidism Continue home dose Synthroid.  Thyroid function tests are normal.  Essential hypertension Stable.  Okay to continue with lisinopril.  However we will recommend holding her hydrochlorothiazide and the amlodipine for now to avoid significant hypotension.  She is noted to be on furosemide.  Type 2 diabetes mellitus Hemoglobin A1c 6.6.  CBGs ranging from 125-181.  Morbid obesity Weight loss counseling provided. Body mass index is 42.23 kg/m.  Hyperlipidemia Statin has been discontinued secondary to possible interactions with daptomycin.  Overall stable.   Okay for discharge home today after today's dose of daptomycin has been given.    PERTINENT LABS:  The results of significant diagnostics from this hospitalization (including imaging, microbiology, ancillary and laboratory) are listed below for reference.    Microbiology: Recent Results (from the past 240 hour(s))  Culture, blood (routine x 2)     Status: Abnormal   Collection Time: 04/02/17 12:36 PM  Result Value Ref Range Status   Specimen Description   Final    BLOOD LEFT ANTECUBITAL Performed at [facility], [address] [place], [place] [address]    Special Requests   Final    BOTTLES DRAWN AEROBIC AND ANAEROBIC Blood Culture adequate volume Performed at [facility] [address] [place], [place] [address]    Culture  Setup Time   Final    GRAM POSITIVE COCCI AEROBIC BOTTLE ONLY CRITICAL RESULT CALLED TO, READ BACK BY AND VERIFIED WITH: [facility] 04/05/17 0726 JDW    Culture (A)  Final    MICROCOCCUS SPECIES THE SIGNIFICANCE OF ISOLATING THIS ORGANISM FROM A SINGLE SET OF BLOOD CULTURES WHEN MULTIPLE SETS ARE DRAWN IS UNCERTAIN. PLEASE NOTIFY THE MICROBIOLOGY DEPARTMENT WITHIN ONE WEEK IF SPECIATION AND SENSITIVITIES ARE REQUIRED. Performed at [facility], [place] [address], [place], [place] [address]    Report Status 04/06/2017 FINAL  Final  Culture, blood (routine x 2)     Status: None   Collection Time: 04/02/17 12:36 PM  Result Value Ref Range Status   Specimen Description   Final    BLOOD RIGHT WRIST Performed at [facility] [address], [place], [place] [address]    Special Requests   Final    BOTTLES DRAWN AEROBIC AND ANAEROBIC Blood Culture adequate volume Performed at [facility] [address], [place], [place] [address]    Culture   Final    NO GROWTH 5 DAYS Performed at [facility], [place] [address] [place], [place] [address]    Report Status 04/07/2017 FINAL   Final  Blood Culture ID Panel (Reflexed)     Status: None   Collection Time: 04/02/17 12:36 PM  Result Value Ref Range Status   Enterococcus species NOT DETECTED NOT DETECTED Final   Listeria monocytogenes NOT DETECTED NOT DETECTED Final    Comment: CRITICAL RESULT CALLED TO, READ BACK BY AND VERIFIED WITH: [facility] 04/05/17 0726 JDW    Staphylococcus species NOT DETECTED NOT DETECTED Final   Staphylococcus aureus NOT DETECTED NOT DETECTED Final   Streptococcus species NOT DETECTED NOT DETECTED Final   Streptococcus agalactiae NOT DETECTED NOT DETECTED Final   Streptococcus pneumoniae NOT DETECTED NOT DETECTED Final   Streptococcus pyogenes NOT DETECTED NOT DETECTED Final   Acinetobacter baumannii NOT DETECTED NOT DETECTED Final   Enterobacteriaceae species NOT DETECTED NOT DETECTED Final   Enterobacter cloacae complex NOT DETECTED NOT DETECTED Final   Escherichia coli NOT DETECTED NOT DETECTED Final   Klebsiella oxytoca NOT DETECTED NOT DETECTED Final   Klebsiella pneumoniae NOT DETECTED NOT DETECTED Final   Proteus species NOT DETECTED NOT DETECTED Final   Serratia marcescens NOT DETECTED NOT DETECTED Final   Haemophilus influenzae NOT DETECTED NOT DETECTED Final   Neisseria meningitidis NOT DETECTED NOT DETECTED Final   Pseudomonas aeruginosa NOT  DETECTED NOT DETECTED Final   Candida albicans NOT DETECTED NOT DETECTED Final   Candida glabrata NOT DETECTED NOT DETECTED Final   Candida krusei NOT DETECTED NOT DETECTED Final   Candida parapsilosis NOT DETECTED NOT DETECTED Final   Candida tropicalis NOT DETECTED NOT DETECTED Final    Comment: Performed at [facility], [place] [address] [place], [place] [address]  Aerobic/Anaerobic Culture (surgical/deep wound)     Status: None   Collection Time: 04/05/17  1:34 PM  Result Value Ref Range Status   Specimen Description   Final    ABSCESS POST FLUORO GUIDED ASPIRATION OF T10 T11 DISC Performed at [facility] [address] [place], [place] [address]    Special Requests   Final    Normal Performed at [facility], [place] [address], [place], [place] [address]    Gram Stain   Final    RARE WBC PRESENT,BOTH PMN AND MONONUCLEAR NO ORGANISMS SEEN    Culture   Final    No growth aerobically or anaerobically. Performed at [facility], [place] [address], [place],  [address]    Report Status 04/10/2017 FINAL  Final     Labs: Basic Metabolic Panel: Recent Labs  Lab 04/05/17 0603 04/06/17 0609 04/07/17 0600 04/08/17 0543  NA 141 138 142 139  K 3.7 4.1 4.3 4.0  CL 103 103 106 105  CO2 _0 GLUCOSE 135* 140* 127* 132*  BUN 18 25* 24* 20  CREATININE 1.27* 1.36* 1.43* 1.35*  CALCIUM 8.8* 8.5* 8.6* 8.1*   CBC: Recent Labs  Lab 04/04/17 0601 04/05/17 0603 04/06/17 0609 04/07/17 0600 04/08/17 0543  WBC 10.3 9.8 11.8* 11.6* 11.3*  NEUTROABS  --   --   --  8.7* 8.4*  HGB 9.1* 9.3* 8.8* 9.1* 8.9*  HCT 28.9* 28.5* 27.2* 28.3* 27.4*  MCV 95.7 95.0 96.8 95.3 95.8  PLT 297 286 285 278 278   Cardiac Enzymes: Recent Labs  Lab 04/07/17 0600  CKTOTAL 43   CBG: Recent Labs  Lab 04/08/17 1139 04/08/17 1641 04/08/17 2026 04/09/17 0753 04/09/17 1146  GLUCAP 150* 136* 148* 145* 139*     IMAGING STUDIES Mr Thoracic Spine Wo Contrast  Result Date: 04/01/2017 CLINICAL DATA:  Chronic mid back pain extending to the right. Pain extends into the abdomen. EXAM: MRI THORACIC SPINE WITHOUT CONTRAST TECHNIQUE: Multiplanar, multisequence MR imaging of the thoracic spine was performed. No intravenous contrast was administered. COMPARISON:  None. FINDINGS: Alignment: Slight retrolisthesis is present at T10-11. AP alignment is otherwise anatomic. Vertebrae: Diffuse endplate marrow changes are present at T10-11 with some loss of vertebral body height on the right and fluid in the disc space. Marrow signal and vertebral body heights are otherwise normal.  Cord: Normal signal is present in the thoracic spinal cord to the conus medullaris which terminates at L1. Paraspinal and other soft tissues: Widening of the endplates is noted at [id number] no significant soft tissue component is present. Paraspinous musculature is unremarkable. Visualized lung fields are clear. Disc levels: There is significant endplate change and loss of vertebral body height on the right. Fluid is present in the disc space. A broad-based disc protrusion contributes to mild central canal stenosis. Endplate changes and facet hypertrophy contribute to moderate foraminal stenosis bilaterally. No other significant disc protrusion or stenosis is evident. IMPRESSION: 1. Endplate changes in fluid in the disc space at T10-11 is highly concerning for disc osteomyelitis. Follow-up MRI  of the thoracic spine with contrast could be used to better define the extent of infection. 2. Slight retrolisthesis is present at T10-11 with mild central canal stenosis. 3. Moderate foraminal stenosis bilaterally at T10-11. 4. No other focal disc protrusion or stenosis. Electronically Signed   By: San Morelle M.D.   On: 04/01/2017 13:08   Ir US Guide Vasc Access Right  Result Date: 04/08/2017 INDICATION: Discitis EXAM: TUNNELED RIGHT JUGULAR PICC LINE PLACEMENT WITH ULTRASOUND AND FLUOROSCOPIC GUIDANCE MEDICATIONS: None ANESTHESIA/SEDATION: None FLUOROSCOPY TIME:  Fluoroscopy Time: 1 minutes 42 seconds (27 mGy). COMPLICATIONS: None immediate. PROCEDURE: The patient was advised of the possible risks and complications and agreed to undergo the procedure. The patient was then brought to the angiographic suite for the procedure. The right neck was prepped with chlorhexidine, draped in the usual sterile fashion using maximum barrier technique (cap and mask, sterile gown, sterile gloves, large sterile sheet, hand hygiene and cutaneous antiseptic). Local anesthesia was attained by infiltration with 1% lidocaine. Ultrasound  demonstrated patency of the right jugular vein, and this was documented with an image. Under real-time ultrasound guidance, this vein was accessed with a 21 gauge micropuncture needle and image documentation was performed. And extended subcutaneous tract was deployed. The needle was exchanged over a guidewire for a peel-away sheath through which a 20 cm 5 French tunneled double lumen power injectable PICC was advanced, and positioned with its tip at the lower SVC/right atrial junction. The cuff was positioned in the subcutaneous tract. Fluoroscopy during the procedure and fluoro spot radiograph confirms appropriate catheter position. The catheter was flushed, secured to the skin with Prolene sutures, and covered with a sterile dressing. IMPRESSION: Successful placement of a right jugular tunneled PICC with sonographic and fluoroscopic guidance. The catheter is ready for use. Electronically Signed   By: Marybelle Killings M.D.   On: 04/08/2017 09:59   Ir Fl Guided Loc Of Needle/cath Tip For Spinal Inject Rt  Result Date: 04/05/2017 INDICATION: Concern for discitis/osteomyelitis involving T10-T11. Please perform fluoroscopic guided disc aspiration. EXAM: FLUOROSCOPIC GUIDED T10-T11 DISC ASPIRATION COMPARISON:  Thoracic spine MRI - 04/01/2017 MEDICATIONS: The patient is currently admitted to the hospital and receiving intravenous antibiotics. The antibiotics were administered within an appropriate time frame prior to the initiation of the procedure. ANESTHESIA/SEDATION: Moderate (conscious) sedation was employed during this procedure. A total of Versed 3 mg and Fentanyl 150 mcg was administered intravenously. Moderate Sedation Time: 18 minutes. The patient's level of consciousness and vital signs were monitored continuously by radiology nursing throughout the procedure under my direct supervision. CONTRAST:  None FLUOROSCOPY TIME:  2 minutes, 30 seconds (606.3 mGy) COMPLICATIONS: None immediate. PROCEDURE: Informed  written consent was obtained from the patient after a discussion of the risks, benefits and alternatives to treatment. The patient was placed prone on the fluoroscopy table and the T10-T11 intervertebral disc space was marked fluoroscopically. The procedure was planned. A timeout was performed prior to the initiation of the procedure. The skin overlying the left flank was prepped and draped in usual sterile fashion. After the overlying soft tissues were anesthetized with 1% lidocaine, an 18 gauge trocar needle was advanced into the intervertebral disc space, anterior to the left T10-T11 pedicles. Appropriate positioning was confirmed with AP and lateral projection images. Multiple spot fluoroscopic images were saved for procedural documentation purposes. A small amount of saline was administered and subsequently flushed ultimately yielding approximately 0.5 cc of blood-tinged fluid. All aspirated fluid was capped and sent to the laboratory for analysis.  The needle was removed and superficial hemostasis was achieved with manual compression. A dressing was placed. The patient tolerated the procedure well without immediate postprocedural complication. IMPRESSION: Technically successful fluoroscopic guided aspiration of the T10-T11 intervertebral disc space yielding approximately 0.5 cc of blood tinged fluid. All aspirated fluid was capped and sent to the laboratory for analysis. Electronically Signed   By: Sandi Mariscal M.D.   On: 04/05/2017 14:14    DISCHARGE EXAMINATION: Vitals:   04/08/17 2028 04/09/17 0552 04/09/17 0953 04/09/17 1400  BP: (!) 137/47 (!) 127/45 (!) 131/47 (!) 119/47  Pulse: 68 75 65 71  Resp: _0 Temp: 98.4 F (36.9 C) 98.5 F (36.9 C) 98.8 F (37.1 C) 98.8 F (37.1 C)  TempSrc: Oral Oral Oral Oral  SpO2: 100% 100% 100% 97%  Weight:      Height:       General appearance: alert, cooperative, appears stated age and no distress Resp: clear to auscultation bilaterally Cardio:  regular rate and rhythm, S1, S2 normal, no murmur, click, rub or gallop GI: soft, non-tender; bowel sounds normal; no masses,  no organomegaly Extremities: extremities normal, atraumatic, no cyanosis or edema  DISPOSITION: Home with home health  Discharge Instructions    Call MD for:  difficulty breathing, headache or visual disturbances   Complete by:  As directed    Call MD for:  extreme fatigue   Complete by:  As directed    Call MD for:  persistant dizziness or light-headedness   Complete by:  As directed    Call MD for:  persistant nausea and vomiting   Complete by:  As directed    Call MD for:  severe uncontrolled pain   Complete by:  As directed    Call MD for:  temperature >100.4   Complete by:  As directed    Diet Carb Modified   Complete by:  As directed    Discharge instructions   Complete by:  As directed    You were cared for by a hospitalist during your hospital stay. If you have any questions about your discharge medications or the care you received while you were in the hospital after you are discharged, you can call the unit and asked to speak with the hospitalist on call if the hospitalist that took care of you is not available. Once you are discharged, your primary care physician will handle any further medical issues. Please note that NO REFILLS for any discharge medications will be authorized once you are discharged, as it is imperative that you return to your primary care physician (or establish a relationship with a primary care physician if you do not have one) for your aftercare needs so that they can reassess your need for medications and monitor your lab values. If you do not have a primary care physician, you can call [phone] for a physician referral.   Home infusion instructions [place] May follow [place] Dosing Protocol; May administer Cathflo as needed to maintain patency of vascular access device.; Flushing of vascular access device: per [facility]  Protocol: 0.9% NaCl pre/post medica...   Complete by:  As directed    Instructions:  May follow [place] Dosing Protocol   Instructions:  May administer Cathflo as needed to maintain patency of vascular access device.   Instructions:  Flushing of vascular access device: per [facility] Protocol: 0.9% NaCl pre/post medication administration and prn patency; Heparin 100 u/ml, 8m for implanted ports and Heparin 10u/ml,  44m for all other central venous catheters.   Instructions:  May follow [facility] Anaphylaxis Protocol for First Dose Administration in the home: 0.9% NaCl at 25-50 ml/hr to maintain IV access for protocol meds. Epinephrine 0.3 ml IV/IM PRN and Benadryl 25-50 IV/IM PRN s/s of anaphylaxis.   Instructions:  [facility] Coord[facility] (RN) to assist per patient IV care needs in the home PRN.   Increase activity slowly   Complete by:  As directed         Allergies as of 04/09/2017      Reactions   Actos [pioglitazone Hydrochloride] Swelling   Ativan [lorazepam]    swelling   Cephalexin Other (See Comments)   Doesn't remember    Oxycodone    This medication makes her feel sick   Rosiglitazone    unknown   Tramadol Other (See Comments)   hallucinations      Medication List    STOP taking these medications   acetaminophen 325 MG tablet Commonly known as:  TYLENOL   amLODipine 10 MG tablet Commonly known as:  NORVASC   aspirin 81 MG chewable tablet   bisacodyl 10 MG suppository Commonly known as:  DULCOLAX   cephALEXin 500 MG capsule Commonly known as:  KEFLEX   docusate sodium 100 MG capsule Commonly known as:  COLACE   hydrochlorothiazide 25 MG tablet Commonly known as:  HYDRODIURIL   LIPITOR 20 MG tablet Generic drug:  atorvastatin   oxyCODONE 5 MG immediate release tablet Commonly known as:  Oxy IR/ROXICODONE   senna 8.6 MG Tabs tablet Commonly known as:  SENOKOT   trimethoprim 100 MG tablet Commonly known as:  TRIMPEX     TAKE these  medications   calcitRIOL 0.25 MCG capsule Commonly known as:  ROCALTROL Take 0.5 mcg by mouth daily.   calcium-vitamin D 500-200 MG-UNIT tablet Commonly known as:  OSCAL WITH D Take 1 tablet by mouth daily with breakfast.   daptomycin IVPB Commonly known as:  CUBICIN Inject 650 mg into the vein daily. Indication:  discitis Last Day of Therapy:  May 16, 2017 Labs - Once weekly:  CBC/D, BMP, and CPK Labs - Every other week:  ESR and CRP   dipyridamole-aspirin 200-25 MG 12hr capsule Commonly known as:  AGGRENOX Take 1 capsule by mouth 2 (two) times daily.   fluticasone 50 MCG/ACT nasal spray Commonly known as:  FLONASE Place 1 spray into the nose daily as needed for allergies.   furosemide 20 MG tablet Commonly known as:  LASIX Take 20 mg by mouth every other day.   HUMULIN N 100 UNIT/ML injection Generic drug:  insulin NPH Human Inject 10 mg into the skin daily before breakfast.   HYDROcodone-acetaminophen 5-325 MG tablet Commonly known as:  NORCO/VICODIN Take 1 tablet by mouth every 6 (six) hours as needed for moderate pain.   levofloxacin 750 MG tablet Commonly known as:  LEVAQUIN Take 1 tablet (750 mg total) by mouth every other day. Till 05/16/17 What changed:  additional instructions   levothyroxine 137 MCG tablet Commonly known as:  SYNTHROID, LEVOTHROID Take 137 mcg by mouth daily before breakfast.   lisinopril 40 MG tablet Commonly known as:  PRINIVIL,ZESTRIL Take 20 mg by mouth 2 (two) times daily.   LYRICA 75 MG capsule Generic drug:  pregabalin Take 75 mg by mouth 2 (two) times daily.   meclizine 12.5 MG tablet Commonly known as:  ANTIVERT Take 1 tablet (12.5 mg total) by mouth 3 (three) times daily  as needed for dizziness.   methocarbamol 500 MG tablet Commonly known as:  ROBAXIN Take 1 tablet (500 mg total) by mouth every 6 (six) hours as needed for muscle spasms.   montelukast 10 MG tablet Commonly known as:  SINGULAIR Take 10 mg by mouth  daily as needed.   nystatin ointment Commonly known as:  MYCOSTATIN Apply 1 application topically 2 (two) times daily as needed for itching.   polyethylene glycol packet Commonly known as:  MIRALAX / GLYCOLAX Take 17 g by mouth 2 (two) times daily.   senna-docusate 8.6-50 MG tablet Commonly known as:  Senokot-S Take 1 tablet by mouth 2 (two) times daily.   simethicone 80 MG chewable tablet Commonly known as:  MYLICON Chew 2 tablets (160 mg total) by mouth 3 (three) times daily.   UNABLE TO FIND Back Brace for discitis of thoracic spine. M46.44.   valACYclovir 1000 MG tablet Commonly known as:  VALTREX TAKE 1000 MG BY MOUTH EVERY 12 HOURS AS NEEDED FOR OUTBREAK ON DAY 1            Home Infusion Instuctions  (From admission, onward)        Start     Ordered   04/09/17 0000  Home infusion instructions [facility] May follow [place] Dosing Protocol; May administer Cathflo as needed to maintain patency of vascular access device.; Flushing of vascular access device: per [facility] Protocol: 0.9% NaCl pre/post medica...    Question Answer Comment  Instructions May follow [place] Dosing Protocol   Instructions May administer Cathflo as needed to maintain patency of vascular access device.   Instructions Flushing of vascular access device: per [facility] Protocol: 0.9% NaCl pre/post medication administration and prn patency; Heparin 100 u/ml, 23m for implanted ports and Heparin 10u/ml, 529mfor all other central venous catheters.   Instructions May follow [facility] Anaphylaxis Protocol for First Dose Administration in the home: 0.9% NaCl at 25-50 ml/hr to maintain IV access for protocol meds. Epinephrine 0.3 ml IV/IM PRN and Benadryl 25-50 IV/IM PRN s/s of anaphylaxis.   Instructions [facility] Infusion Coordinator (RN) to assist per patient IV care needs in the home PRN.      04/09/17 1010       Follow-up Information    [name], [name], MD. Schedule an appointment as soon  as possible for a visit in 1 week(s).   Specialty:  Family Medicine Contact information: [address] [phone]        CoThayer HeadingsMD Follow up.   Specialty:  Infectious Diseases Why:  his office will schedule appt Contact information: [address] [place] [phone]        [facility] Follow up.   Why:  Home Health Physical Therapy, Occupational Therapy, RN- agency will call to arrange initial appointment.  Contact information: [address].   Specialty:  [place]:  will deliver medication to home  Contact information: [phone]         TOTAL DISCHARGE TIME: 35 minutes  [facility] Pager [phone]/07/2017, 12:52 PM

## 2017-04-09 NOTE — Plan of Care (Signed)
Discharge instructions reviewed with patient and family members, questions answered, verbalized understanding.  Patient given all prescriptions including prescription for back brace.  Patient with new rolling walker and all belongings transported to main entrance of hospital to be taken home by daughter.

## 2017-04-09 NOTE — Progress Notes (Signed)
NCM contacted [facility] for RW with seat. Pt states her RW with seat is broken. Explained she may not receive if insurance has covered in past 3 years. Contacted [facility] and start of care for IV abx in 4/7. She will receive her IV abx dose today prior to dc. Notified [facility] also the provider for medication. Jonnie Finner RN CCM Case Mgmt phone [phone]

## 2017-04-09 NOTE — Progress Notes (Signed)
Pt states dtr cannot pick her up until 1630.  RN aware and will re consult IVT when ready for DC.

## 2017-04-10 LAB — AEROBIC/ANAEROBIC CULTURE (SURGICAL/DEEP WOUND): SPECIAL REQUESTS: NORMAL

## 2017-04-10 LAB — AEROBIC/ANAEROBIC CULTURE W GRAM STAIN (SURGICAL/DEEP WOUND): Culture: NO GROWTH

## 2017-04-13 ENCOUNTER — Telehealth: Payer: Self-pay | Admitting: *Deleted

## 2017-04-13 NOTE — Progress Notes (Addendum)
NCM received call from Infectious Disease MD office RN, Sharyn Lull. States pt has called and was informed by her [facility] RN that last visit is on Thursday. [place] Liaison and states [facility] RN is arranged for pt while receiving IV abx until 5/13. Contacted [facility] and they medication was delivered to home. Jonnie Finner RN CCM Case Mgmt phone [phone]

## 2017-04-13 NOTE — Telephone Encounter (Signed)
Patient left message in triage stating Alvis Lemmings told her her medication ends Thursday. RN placed call to Jonnie Finner, hospital case manager at discharge, and asked her to please contact home health/pharmacy to make sure they have the correct orders. RN called patient to update her. She states it is not her medication that ends, but nursing care. SHe needs a referral to a new nursing provider in [place]. RN gave her [facility] number. Landis Gandy, RN

## 2017-04-20 ENCOUNTER — Non-Acute Institutional Stay (SKILLED_NURSING_FACILITY): Payer: Medicare Other | Admitting: Adult Health

## 2017-04-20 ENCOUNTER — Encounter: Payer: Self-pay | Admitting: Adult Health

## 2017-04-20 ENCOUNTER — Other Ambulatory Visit: Payer: Self-pay

## 2017-04-20 DIAGNOSIS — E1143 Type 2 diabetes mellitus with diabetic autonomic (poly)neuropathy: Secondary | ICD-10-CM | POA: Diagnosis not present

## 2017-04-20 DIAGNOSIS — E119 Type 2 diabetes mellitus without complications: Secondary | ICD-10-CM | POA: Diagnosis not present

## 2017-04-20 DIAGNOSIS — G061 Intraspinal abscess and granuloma: Secondary | ICD-10-CM | POA: Diagnosis not present

## 2017-04-20 DIAGNOSIS — E785 Hyperlipidemia, unspecified: Secondary | ICD-10-CM

## 2017-04-20 DIAGNOSIS — Z794 Long term (current) use of insulin: Secondary | ICD-10-CM

## 2017-04-20 DIAGNOSIS — M8618 Other acute osteomyelitis, other site: Secondary | ICD-10-CM | POA: Diagnosis not present

## 2017-04-20 DIAGNOSIS — K5901 Slow transit constipation: Secondary | ICD-10-CM

## 2017-04-20 DIAGNOSIS — Z981 Arthrodesis status: Secondary | ICD-10-CM | POA: Diagnosis not present

## 2017-04-20 DIAGNOSIS — E1159 Type 2 diabetes mellitus with other circulatory complications: Secondary | ICD-10-CM

## 2017-04-20 DIAGNOSIS — E1169 Type 2 diabetes mellitus with other specified complication: Secondary | ICD-10-CM

## 2017-04-20 DIAGNOSIS — E890 Postprocedural hypothyroidism: Secondary | ICD-10-CM | POA: Diagnosis not present

## 2017-04-20 DIAGNOSIS — I639 Cerebral infarction, unspecified: Secondary | ICD-10-CM | POA: Diagnosis not present

## 2017-04-20 DIAGNOSIS — I129 Hypertensive chronic kidney disease with stage 1 through stage 4 chronic kidney disease, or unspecified chronic kidney disease: Secondary | ICD-10-CM

## 2017-04-20 DIAGNOSIS — I10 Essential (primary) hypertension: Secondary | ICD-10-CM

## 2017-04-20 MED ORDER — HYDROCODONE-ACETAMINOPHEN 5-325 MG PO TABS: 1.0000 | ORAL_TABLET | Freq: Four times a day (QID) | ORAL | 0 refills | Status: DC | PRN

## 2017-04-20 MED ORDER — LYRICA 75 MG PO CAPS: 75.0000 mg | ORAL_CAPSULE | Freq: Two times a day (BID) | ORAL | 0 refills | Status: DC

## 2017-04-20 NOTE — Telephone Encounter (Signed)
RX faxed to [facility] @ [phone], phone number [phone] 

## 2017-04-20 NOTE — Progress Notes (Addendum)
Location:   [facility] Room Number: 126 A Place of Service:  SNF (31)   CODE STATUS: Full Code  Allergies  Allergen Reactions  . Actos [Pioglitazone Hydrochloride] Swelling  . Ativan [Lorazepam]     swelling  . Cephalexin Other (See Comments)    Doesn't remember   . Oxycodone     This medication makes her feel sick  . Rosiglitazone     unknown  . Tramadol Other (See Comments)    hallucinations    Chief Complaint  Patient presents with  . Acute Visit    Transfer in from home    HPI:  She is a 77 year old woman who has a history of chronic back pain. She went to her PCP who ordered an MRI which was highly concerning for disc osteomyelitis. She was hospitalized. She underwent fluoroscopic guided aspiration of the thoracic spine on 04-05-17. She was started on daptomycin and levaquin. She will need these antibiotics through 05-16-17. She states that her back pain is being managed; she denies any fevers; does have chronic lower extremity edema. She will be followed for her chronic illnesses including: hypertension; diabetes; hypothyroidism. There are no nursing concerns at this time.    Past Medical History:  Diagnosis Date  . Anemia   . Asthma   . Chest pain, atypical   . Diabetes mellitus   . GERD (gastroesophageal reflux disease)   . History of transient ischemic attack (TIA)   . Hyperlipidemia   . Hypertension   . OA (osteoarthritis)   . Thyroid carcinoma [facility])     Past Surgical History:  Procedure Laterality Date  . BACK SURGERY    . BREAST BIOPSY    . CARDIAC CATHETERIZATION  03/18/2009   NORMAL LEFT VENTRICULAR SIZE AND CONTRACTILITY WITH NORMAL SYSTOLIC  FUNCTION. EF 60%  . CARDIOLITE STUDY     SHOWED A SIGNIFICANT REVERSIBLE ANTERIOR WALL DEFECT CONSISTENT WITH ISCHEMIA. EF 55%  . FEMUR IM NAIL Right 04/25/2015   Procedure: INTRAMEDULLARY (IM) RIGHT  RETROGRADE FEMORAL NAILING;  Surgeon: Rod Can, MD;  Location: [facility];  Service:  Orthopedics;  Laterality: Right;  . IR FL GUIDED LOC OF NEEDLE/CATH TIP FOR SPINAL INJECTION RT  04/05/2017  . IR US GUIDE VASC ACCESS RIGHT  04/08/2017  . LUMBAR FUSION    . THYROIDECTOMY    . TOTAL KNEE ARTHROPLASTY     right    Social History   Socioeconomic History  . Marital status: Widowed    Spouse name: Not on file  . Number of children: 5  . Years of education: Not on file  . Highest education level: Not on file  Occupational History    Employer: RETIRED  Social Needs  . Financial resource strain: Not on file  . Food insecurity:    Worry: Not on file    Inability: Not on file  . Transportation needs:    Medical: Not on file    Non-medical: Not on file  Tobacco Use  . Smoking status: Former Research scientist (life sciences)  . Smokeless tobacco: Never Used  Substance and Sexual Activity  . Alcohol use: No  . Drug use: No  . Sexual activity: Not on file  Lifestyle  . Physical activity:    Days per week: Not on file    Minutes per session: Not on file  . Stress: Not on file  Relationships  . Social connections:    Talks on phone: Not on file    Gets together:  Not on file    Attends religious service: Not on file    Active member of club or organization: Not on file    Attends meetings of clubs or organizations: Not on file    Relationship status: Not on file  . Intimate partner violence:    Fear of current or ex partner: Not on file    Emotionally abused: Not on file    Physically abused: Not on file    Forced sexual activity: Not on file  Other Topics Concern  . Not on file  Social History Narrative  . Not on file   Family History  Problem Relation Age of Onset  . Pneumonia Mother   . Heart attack Father       VITAL SIGNS BP 118/87   Pulse 80   Temp (!) 97.2 F (36.2 C)   Resp 16   SpO2 97%   Outpatient Encounter Medications as of 04/20/2017  Medication Sig  . atorvastatin (LIPITOR) 20 MG tablet Take 20 mg by mouth daily.  . calcitRIOL (ROCALTROL) 0.25 MCG capsule  Take 0.5 mcg by mouth 2 (two) times daily.   . calcium-vitamin D (OSCAL WITH D) 500-200 MG-UNIT per tablet Take 1 tablet by mouth daily with breakfast.  . daptomycin (CUBICIN) IVPB Inject 650 mg into the vein daily. Indication:  discitis Last Day of Therapy:  May 16, 2017 Labs - Once weekly:  CBC/D, BMP, and CPK Labs - Every other week:  ESR and CRP  . dipyridamole-aspirin (AGGRENOX) 25-200 MG per 12 hr capsule Take 1 capsule by mouth 2 (two) times daily.   . fluticasone (FLONASE) 50 MCG/ACT nasal spray Place 1 spray into the nose daily as needed for allergies.   . furosemide (LASIX) 20 MG tablet Take 20 mg by mouth every other day.   Marland Kitchen HUMULIN N 100 UNIT/ML injection Inject 10 mg into the skin daily before breakfast.   . HYDROcodone-acetaminophen (NORCO/VICODIN) 5-325 MG tablet Take 1 tablet by mouth every 6 (six) hours as needed for moderate pain.  Marland Kitchen levofloxacin (LEVAQUIN) 750 MG tablet Take 1 tablet (750 mg total) by mouth every other day. Till 05/16/17  . levothyroxine (SYNTHROID, LEVOTHROID) 137 MCG tablet Take 137 mcg by mouth daily before breakfast.  . lisinopril (PRINIVIL,ZESTRIL) 40 MG tablet Take 40 mg by mouth daily.   Marland Kitchen LYRICA 75 MG capsule Take 75 mg by mouth 2 (two) times daily.  . meclizine (ANTIVERT) 12.5 MG tablet Take 12.5 mg by mouth daily as needed for dizziness.  . methocarbamol (ROBAXIN) 500 MG tablet Take 1 tablet (500 mg total) by mouth every 6 (six) hours as needed for muscle spasms.  . montelukast (SINGULAIR) 10 MG tablet Take 10 mg by mouth daily as needed.  . nystatin ointment (MYCOSTATIN) Apply 1 application topically 2 (two) times daily as needed for itching.  . ondansetron (ZOFRAN) 4 MG tablet Take 4 mg by mouth 2 (two) times daily as needed for nausea or vomiting.  . polyethylene glycol (MIRALAX / GLYCOLAX) packet Take 17 g by mouth 2 (two) times daily.  Marland Kitchen senna-docusate (SENOKOT-S) 8.6-50 MG tablet Take 1 tablet by mouth 2 (two) times daily.  . simethicone  (MYLICON) 80 MG chewable tablet Chew 160 mg by mouth daily as needed for flatulence.  . sodium chloride 0.9 % injection Inject 10 mLs into the vein daily. Flush before antibiotic, administer antibiotic and follow with flust  . trimethoprim (TRIMPEX) 100 MG tablet Take 100 mg by mouth daily.  Marland Kitchen  valACYclovir (VALTREX) 1000 MG tablet TAKE 1000 MG BY MOUTH DAILY AS NEEDED   No facility-administered encounter medications on file as of 04/20/2017.      SIGNIFICANT DIAGNOSTIC EXAMS  TODAY;   04-01-17: MRI thoracic spine: 1. Endplate changes in fluid in the disc space at T10-11 is highly concerning for disc osteomyelitis. Follow-up MRI of the thoracic spine with contrast could be used to better define the extent of infection. 2. Slight retrolisthesis is present at T10-11 with mild central canal stenosis. 3. Moderate foraminal stenosis bilaterally at T10-11. 4. No other focal disc protrusion or stenosis.   LABS REVIEWED:   04-02-17: wbc 10.4; hgb 9.2; hct 29.0; mcv 97.2; plt  339; glucose 149; bun 27; creat 1.62; k+ 4.0p na++ 142; ca 9.1; liver normal albumin 3.9; blood culture: negative 04-05-17: wbc 9.8; hgb 9.3; hct 28.5; mcv 95.0; plt 286; glucose 135; bun 18; creat 1.27; k+ 3.7; na++ 141; ca 8.8 04-06-17: wbc 11.8; hgb 8.8; hct 27.2; mcv 96.8; plt 285; glucose 140; bun 25; creat 1.36; k+ 4.1; na++ 138; ca 8.5; vit B 12: 469; folate 19.5; iron 35; tibc 251; ferritin 60 04-07-17: hgb a1c 6.6 04-08-17: tsh 1.011; free T4: 1.04     Review of Systems  Constitutional: Negative for malaise/fatigue.  Respiratory: Negative for cough and shortness of breath.   Cardiovascular: Negative for chest pain, palpitations and leg swelling.  Gastrointestinal: Negative for abdominal pain, constipation and heartburn.  Musculoskeletal: Positive for back pain. Negative for joint pain and myalgias.       Pain is managed   Skin: Negative.   Neurological: Negative for dizziness.  Psychiatric/Behavioral: The patient is  not nervous/anxious.     Physical Exam  Constitutional: She is oriented to person, place, and time. She appears well-developed and well-nourished. No distress.  Neck: No thyromegaly present.  Cardiovascular: Normal rate, regular rhythm, normal heart sounds and intact distal pulses.  Pulmonary/Chest: Effort normal and breath sounds normal. No respiratory distress.  Abdominal: Soft. Bowel sounds are normal. She exhibits no distension. There is no tenderness.  Musculoskeletal: She exhibits edema.  Trace bilateral lower extremity edema  Is able to move all extremities  Right side weakness  Lymphadenopathy:    She has no cervical adenopathy.  Neurological: She is alert and oriented to person, place, and time.  Skin: Skin is warm and dry. She is not diaphoretic.  picc line right upper chest wall   Psychiatric: She has a normal mood and affect.    ASSESSMENT/ PLAN:  TODAY:   1. Benign essential hypertension/hypertension associated with diabetes: stable b/p 118/87: will continue lisinopril 40 mg daily   2. Insulin dependent type 2 diabetes mellitus, controlled: stable hgb a1c 6.6: will continue humulin N 10 units in the AM is on asa ace and statin   3. Dyslipidemia associated with type 2 diabetes mellitus: stable will continue lipitor 20 mg daily   4. Post operative hypothyroidism: status post thyroidectomy due thyroid cancer: is stable tsh 1.011 with free T4: 1.04; will continue synthroid 137 mcg daily   5. 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  Peripheral autonomic neuropathy due to diabetes mellitus type 2: is stable will continue lyrica 75 mg twice daily   8. Osteomyelitis thoracic spine/thoacic discitis: is without change: will continue daptomycin 650 mg daily and levaquin 750 mg daily through 05-16-17.   9.  Recurrent UTI: without recent infections: will continue  trimpex 100 gm daily for suppression  therapy.   10. Slow transit constipation; stable will continue miralax twice daily and senna s twice daily   11. Bilateral lower extremity edema: stable will continue lasix 20 mg every other day   12. Allergic rhinitis: stable will continue flonase daily as needed and singulair 10 mg daily as needed  13.  Chronic back pain; status post lumbar spinal fusion: stable will continue vicodin 5/325 mg every 6 hours as needed and robaxin 500 mg every 6 hours as needed   14. Hypertensive renal disease with renal failure stage 1-4: stable bun 25; creat 1.36; will continue calcitriol 0.25 mcg twice daily     MD is aware of resident's narcotic use and is in agreement with current plan of care. We will attempt to wean resident as apropriate   Ok Edwards NP [facility] Adult [facility]  Contact [phone] Monday through Friday 8am- 5pm  After hours call [phone]

## 2017-04-22 ENCOUNTER — Non-Acute Institutional Stay (SKILLED_NURSING_FACILITY): Payer: Medicare Other | Admitting: Adult Health

## 2017-04-22 ENCOUNTER — Encounter: Payer: Self-pay | Admitting: Adult Health

## 2017-04-22 DIAGNOSIS — E1143 Type 2 diabetes mellitus with diabetic autonomic (poly)neuropathy: Secondary | ICD-10-CM

## 2017-04-22 DIAGNOSIS — M4624 Osteomyelitis of vertebra, thoracic region: Secondary | ICD-10-CM | POA: Diagnosis not present

## 2017-04-22 LAB — CBC AND DIFFERENTIAL
HEMATOCRIT: 28 — AB (ref 36–46)
HEMOGLOBIN: 9.4 — AB (ref 12.0–16.0)
Neutrophils Absolute: 7
Platelets: 316 (ref 150–399)
WBC: 9.5

## 2017-04-22 LAB — BASIC METABOLIC PANEL
BUN: 26 — AB (ref 4–21)
CREATININE: 1.7 — AB (ref 0.5–1.1)
Glucose: 119
Potassium: 4.6 (ref 3.4–5.3)
Sodium: 139 (ref 137–147)

## 2017-04-22 LAB — POCT ERYTHROCYTE SEDIMENTATION RATE, NON-AUTOMATED: SED RATE: 47

## 2017-04-22 MED ORDER — LYRICA 75 MG PO CAPS
75.0000 mg | ORAL_CAPSULE | Freq: Three times a day (TID) | ORAL | 0 refills | Status: DC
Start: 2017-04-22 — End: 2017-05-04

## 2017-04-22 NOTE — Progress Notes (Signed)
Location:   [facility] Room Number: 128 Place of Service:  SNF (31)   CODE STATUS: full code   Allergies  Allergen Reactions  . Actos [Pioglitazone Hydrochloride] Swelling  . Ativan [Lorazepam]     swelling  . Cephalexin Other (See Comments)    Doesn't remember   . Oxycodone     This medication makes her feel sick  . Rosiglitazone     unknown  . Tramadol Other (See Comments)    hallucinations    Chief Complaint  Patient presents with  . Acute Visit    pain management     HPI:  She is complaining of back pain which is not being adequately relieved. She states that the lyrica had been effective in the past for her pain. She would like to try a higher dose to better manage her pain. She states that she is still able to participate in therapy. She denies any numbness or tingling in her legs. We also did discuss raising the dose of her vicodin. I did tell her that doing this would not be good for her overall health and she did agree.    Past Medical History:  Diagnosis Date  . Anemia   . Asthma   . Chest pain, atypical   . Diabetes mellitus   . GERD (gastroesophageal reflux disease)   . History of transient ischemic attack (TIA)   . Hyperlipidemia   . Hypertension   . OA (osteoarthritis)   . Thyroid carcinoma [facility])     Past Surgical History:  Procedure Laterality Date  . BACK SURGERY    . BREAST BIOPSY    . CARDIAC CATHETERIZATION  03/18/2009   NORMAL LEFT VENTRICULAR SIZE AND CONTRACTILITY WITH NORMAL SYSTOLIC  FUNCTION. EF 60%  . CARDIOLITE STUDY     SHOWED A SIGNIFICANT REVERSIBLE ANTERIOR WALL DEFECT CONSISTENT WITH ISCHEMIA. EF 55%  . FEMUR IM NAIL Right 04/25/2015   Procedure: INTRAMEDULLARY (IM) RIGHT  RETROGRADE FEMORAL NAILING;  Surgeon: Rod Can, MD;  Location: [facility];  Service: Orthopedics;  Laterality: Right;  . IR FL GUIDED LOC OF NEEDLE/CATH TIP FOR SPINAL INJECTION RT  04/05/2017  . IR US GUIDE VASC ACCESS RIGHT  04/08/2017  .  LUMBAR FUSION    . THYROIDECTOMY    . TOTAL KNEE ARTHROPLASTY     right    Social History   Socioeconomic History  . Marital status: Widowed    Spouse name: Not on file  . Number of children: 5  . Years of education: Not on file  . Highest education level: Not on file  Occupational History    Employer: RETIRED  Social Needs  . Financial resource strain: Not on file  . Food insecurity:    Worry: Not on file    Inability: Not on file  . Transportation needs:    Medical: Not on file    Non-medical: Not on file  Tobacco Use  . Smoking status: Former Research scientist (life sciences)  . Smokeless tobacco: Never Used  Substance and Sexual Activity  . Alcohol use: No  . Drug use: No  . Sexual activity: Not on file  Lifestyle  . Physical activity:    Days per week: Not on file    Minutes per session: Not on file  . Stress: Not on file  Relationships  . Social connections:    Talks on phone: Not on file    Gets together: Not on file    Attends religious service:  Not on file    Active member of club or organization: Not on file    Attends meetings of clubs or organizations: Not on file    Relationship status: Not on file  . Intimate partner violence:    Fear of current or ex partner: Not on file    Emotionally abused: Not on file    Physically abused: Not on file    Forced sexual activity: Not on file  Other Topics Concern  . Not on file  Social History Narrative  . Not on file   Family History  Problem Relation Age of Onset  . Pneumonia Mother   . Heart attack Father       VITAL SIGNS BP 135/64   Pulse 61   Temp (!) 97.4 F (36.3 C)   Resp 18   Ht '5\' 5"'$  (1.651 m)   Wt 253 lb (114.8 kg)   SpO2 96%   BMI 42.10 kg/m   Outpatient Encounter Medications as of 04/22/2017  Medication Sig  . atorvastatin (LIPITOR) 20 MG tablet Take 20 mg by mouth daily.  . calcitRIOL (ROCALTROL) 0.25 MCG capsule Take 0.5 mcg by mouth 2 (two) times daily.   . calcium-vitamin D (OSCAL WITH D) 500-200  MG-UNIT per tablet Take 1 tablet by mouth daily with breakfast.  . daptomycin (CUBICIN) IVPB Inject 650 mg into the vein daily. Indication:  discitis Last Day of Therapy:  May 16, 2017 Labs - Once weekly:  CBC/D, BMP, and CPK Labs - Every other week:  ESR and CRP  . dipyridamole-aspirin (AGGRENOX) 25-200 MG per 12 hr capsule Take 1 capsule by mouth 2 (two) times daily.   . fluticasone (FLONASE) 50 MCG/ACT nasal spray Place 1 spray into the nose daily as needed for allergies.   . furosemide (LASIX) 20 MG tablet Take 20 mg by mouth every other day.   Marland Kitchen HUMULIN N 100 UNIT/ML injection Inject 10 mg into the skin daily before breakfast.   . HYDROcodone-acetaminophen (NORCO/VICODIN) 5-325 MG tablet Take 1 tablet by mouth every 6 (six) hours as needed for moderate pain.  Marland Kitchen levofloxacin (LEVAQUIN) 750 MG tablet Take 1 tablet (750 mg total) by mouth every other day. Till 05/16/17  . levothyroxine (SYNTHROID, LEVOTHROID) 137 MCG tablet Take 137 mcg by mouth daily before breakfast.  . lisinopril (PRINIVIL,ZESTRIL) 40 MG tablet Take 40 mg by mouth daily.   Marland Kitchen LYRICA 75 MG capsule Take 1 capsule (75 mg total) by mouth 2 (two) times daily.  . meclizine (ANTIVERT) 12.5 MG tablet Take 12.5 mg by mouth daily as needed for dizziness.  . methocarbamol (ROBAXIN) 500 MG tablet Take 1 tablet (500 mg total) by mouth every 6 (six) hours as needed for muscle spasms.  . montelukast (SINGULAIR) 10 MG tablet Take 10 mg by mouth daily as needed.  . nystatin ointment (MYCOSTATIN) Apply 1 application topically 2 (two) times daily as needed for itching.  . ondansetron (ZOFRAN) 4 MG tablet Take 4 mg by mouth 2 (two) times daily as needed for nausea or vomiting.  . polyethylene glycol (MIRALAX / GLYCOLAX) packet Take 17 g by mouth 2 (two) times daily.  Marland Kitchen senna-docusate (SENOKOT-S) 8.6-50 MG tablet Take 1 tablet by mouth 2 (two) times daily.  . simethicone (MYLICON) 80 MG chewable tablet Chew 160 mg by mouth daily as needed for  flatulence.  . sodium chloride 0.9 % injection Inject 10 mLs into the vein daily. Flush before antibiotic, administer antibiotic and follow with flust  .  trimethoprim (TRIMPEX) 100 MG tablet Take 100 mg by mouth daily.  . valACYclovir (VALTREX) 1000 MG tablet TAKE 1000 MG BY MOUTH DAILY AS NEEDED  . [DISCONTINUED] escitalopram (LEXAPRO) 10 MG tablet Take 10 mg by mouth daily.     No facility-administered encounter medications on file as of 04/22/2017.      SIGNIFICANT DIAGNOSTIC EXAMS  PREVIOUS;   04-01-17: MRI thoracic spine: 1. Endplate changes in fluid in the disc space at T10-11 is highly concerning for disc osteomyelitis. Follow-up MRI of the thoracic spine with contrast could be used to better define the extent of infection. 2. Slight retrolisthesis is present at T10-11 with mild central canal stenosis. 3. Moderate foraminal stenosis bilaterally at T10-11. 4. No other focal disc protrusion or stenosis.  NO NEW EXAMS   LABS REVIEWED: PREVIOUS   04-02-17: wbc 10.4; hgb 9.2; hct 29.0; mcv 97.2; plt  339; glucose 149; bun 27; creat 1.62; k+ 4.0p na++ 142; ca 9.1; liver normal albumin 3.9; blood culture: negative 04-05-17: wbc 9.8; hgb 9.3; hct 28.5; mcv 95.0; plt 286; glucose 135; bun 18; creat 1.27; k+ 3.7; na++ 141; ca 8.8 04-06-17: wbc 11.8; hgb 8.8; hct 27.2; mcv 96.8; plt 285; glucose 140; bun 25; creat 1.36; k+ 4.1; na++ 138; ca 8.5; vit B 12: 469; folate 19.5; iron 35; tibc 251; ferritin 60 04-07-17: hgb a1c 6.6 04-08-17: tsh 1.011; free T4: 1.04   NO NEW LABS      Review of Systems  Constitutional: Negative for malaise/fatigue.  Respiratory: Negative for cough and shortness of breath.   Cardiovascular: Negative for chest pain, palpitations and leg swelling.  Gastrointestinal: Negative for abdominal pain, constipation and heartburn.  Musculoskeletal: Positive for back pain. Negative for joint pain and myalgias.  Skin: Negative.   Neurological: Negative for dizziness.    Psychiatric/Behavioral: The patient is not nervous/anxious.     Physical Exam  Constitutional: She is oriented to person, place, and time. She appears well-developed and well-nourished. No distress.  Neck: No thyromegaly present.  Cardiovascular: Normal rate, regular rhythm, normal heart sounds and intact distal pulses.  Pulmonary/Chest: Effort normal and breath sounds normal. No respiratory distress.  Abdominal: Soft. Bowel sounds are normal. She exhibits no distension. There is no tenderness.  Musculoskeletal:  Is able to move all extremities has some right side weakness present 1+ bilateral lower extremity edema   Lymphadenopathy:    She has no cervical adenopathy.  Neurological: She is alert and oriented to person, place, and time.  Skin: Skin is warm. She is not diaphoretic.  Has picc line right upper chest   Psychiatric: She has a normal mood and affect.    ASSESSMENT/ PLAN:  TODAY;   1. Peripheral autonomic neuropathy due to diabetes mellitus 2. Osteomyelitis of thoracic spine  Will increase her lyrica to 75 mg every 8 hours and will monitor her status.   MD is aware of resident's narcotic use and is in agreement with current plan of care. We will attempt to wean resident as apropriate   Ok Edwards NP [facility] Adult Medicine  Contact [phone] Monday through Friday 8am- 5pm  After hours call [phone]

## 2017-04-25 ENCOUNTER — Non-Acute Institutional Stay (SKILLED_NURSING_FACILITY): Payer: Medicare Other | Admitting: Internal Medicine

## 2017-04-25 ENCOUNTER — Encounter: Payer: Self-pay | Admitting: Internal Medicine

## 2017-04-25 DIAGNOSIS — Z794 Long term (current) use of insulin: Secondary | ICD-10-CM | POA: Diagnosis not present

## 2017-04-25 DIAGNOSIS — E1122 Type 2 diabetes mellitus with diabetic chronic kidney disease: Secondary | ICD-10-CM

## 2017-04-25 DIAGNOSIS — I10 Essential (primary) hypertension: Secondary | ICD-10-CM

## 2017-04-25 DIAGNOSIS — R748 Abnormal levels of other serum enzymes: Secondary | ICD-10-CM | POA: Diagnosis not present

## 2017-04-25 DIAGNOSIS — M4624 Osteomyelitis of vertebra, thoracic region: Secondary | ICD-10-CM

## 2017-04-25 DIAGNOSIS — I6930 Unspecified sequelae of cerebral infarction: Secondary | ICD-10-CM

## 2017-04-25 DIAGNOSIS — I152 Hypertension secondary to endocrine disorders: Secondary | ICD-10-CM

## 2017-04-25 DIAGNOSIS — E1159 Type 2 diabetes mellitus with other circulatory complications: Secondary | ICD-10-CM | POA: Diagnosis not present

## 2017-04-25 DIAGNOSIS — N183 Chronic kidney disease, stage 3 unspecified: Secondary | ICD-10-CM

## 2017-04-25 DIAGNOSIS — D638 Anemia in other chronic diseases classified elsewhere: Secondary | ICD-10-CM | POA: Diagnosis not present

## 2017-04-25 DIAGNOSIS — E890 Postprocedural hypothyroidism: Secondary | ICD-10-CM | POA: Diagnosis not present

## 2017-04-25 DIAGNOSIS — E1169 Type 2 diabetes mellitus with other specified complication: Secondary | ICD-10-CM

## 2017-04-25 DIAGNOSIS — E785 Hyperlipidemia, unspecified: Secondary | ICD-10-CM

## 2017-04-25 NOTE — Progress Notes (Signed)
Patient ID: [name], female   DOB: [birth year], 77 y.o.   MRN: [id number]  Provider:  DR Arletha Grippe Location:  [place] Room Number: 126 A Place of Service:  SNF (31)  PCP: Lujean Amel, MD Patient Care Team: Lujean Amel, MD as PCP - General (Family Medicine)  Extended Emergency Contact Information Primary Emergency Contact: Parr,Regina Address: [address]          [place], [place] [address] [place] Phone: [phone] Work Phone: [phone] Relation: Daughter Secondary Emergency Contact: Donnell,Sylenia Address: [address]          [place], [place] [address] [place] Phone: [phone] Mobile Phone: [phone] Relation: Daughter  Code Status: Full Code Goals of Care: Advanced Directive information Advanced Directives 04/25/2017  Does Patient Have a Medical Advance Directive? No  Type of Advance Directive -  Does patient want to make changes to medical advance directive? -  Copy of [place] in Chart? -  Would patient like information on creating a medical advance directive? No - Patient declined      Chief Complaint  Patient presents with  . New Admit To SNF    Admission    HPI: Patient is a 77 y.o. female seen today for admission to SNF following hospital stay for T10-T11 disciitis/osteomyelitis, acute/CKD stage 3, constipation, chronic anemia. MRi L spine revealed end plate changes in fluid sac in disc @ T10-T11 highly concerning for disc osteo. On 04/05/17 she underwent IR fluoroscopic guided aspiration of T-spine. She was tx with IV vanco/daptomycin and po levaquin. ID consulted. PICC line placed. Cr 1.8-->1.35; A1c 6.6%; abscess cx neg growth; albumin 3.9; Hgb 8.9; WBC 11.3K; sed rate 45 at d/c. She presents to SNF for short term rehab.  Today she has no concerns. CBG 110-160s. No low BS reactions. No nursing issues. No falls. Appetite reduced. Sleeps well. Stop  date on abx 05/16/17. SNF labs reviewed: WBC 9.5K; Hgb 8.4; pls 316K; Cr 1.67; CRP 2.77; sed rate 47; CK 683.  HTN - stable on lisinopril 40 mg daily   DM - controlled. A1c 6.6%. She takes humulin N 10 units in the AM; ACEI and statin. She has neuropathy and takes lyrica 75 mg twice daily   Hyperlipidemia - stable on lipitor 20 mg daily   Post operative hypothyroidism 2/2 thyroidectomy due to thyroid cancer - stable with TSH 1.011; T4 free 1.04. Takes synthroid 137 mcg daily   Hx CVA - has right hemiparesis. No new deficits. Takes agggrenox 25/100 mg twice daily   Hx Recurrent UTI - stable on trimpex 100 gm daily for suppression therapy.   Slow transit constipation - stable on miralax twice daily and senna s twice daily   Bilateral lower extremity edema - stable on lasix 20 mg every other day   Allergic rhinitis - stable on flonase daily as needed and singulair 10 mg daily as needed  Chronic back pain - s/p lumbar spinal fusion; pain controlled on vicodin 5/325 mg every 6 hours as needed and robaxin 500 mg every 6 hours as needed   CKD - stage 3-4. Cr 1.36; takes calcitriol 0.25 mcg twice daily    Past Medical History:  Diagnosis Date  . Anemia   . Asthma   . Chest pain, atypical   . Diabetes mellitus   . GERD (gastroesophageal reflux disease)   . History of transient ischemic attack (TIA)   . Hyperlipidemia   .  Hypertension   . OA (osteoarthritis)   . Thyroid carcinoma [facility])    Past Surgical History:  Procedure Laterality Date  . BACK SURGERY    . BREAST BIOPSY    . CARDIAC CATHETERIZATION  03/18/2009   NORMAL LEFT VENTRICULAR SIZE AND CONTRACTILITY WITH NORMAL SYSTOLIC  FUNCTION. EF 60%  . CARDIOLITE STUDY     SHOWED A SIGNIFICANT REVERSIBLE ANTERIOR WALL DEFECT CONSISTENT WITH ISCHEMIA. EF 55%  . FEMUR IM NAIL Right 04/25/2015   Procedure: INTRAMEDULLARY (IM) RIGHT  RETROGRADE FEMORAL NAILING;  Surgeon: Rod Can, MD;  Location: [facility];  Service: Orthopedics;   Laterality: Right;  . IR FL GUIDED LOC OF NEEDLE/CATH TIP FOR SPINAL INJECTION RT  04/05/2017  . IR US GUIDE VASC ACCESS RIGHT  04/08/2017  . LUMBAR FUSION    . THYROIDECTOMY    . TOTAL KNEE ARTHROPLASTY     right    reports that she has quit smoking. She has never used smokeless tobacco. She reports that she does not drink alcohol or use drugs. Social History   Socioeconomic History  . Marital status: Widowed    Spouse name: Not on file  . Number of children: 5  . Years of education: Not on file  . Highest education level: Not on file  Occupational History    Employer: RETIRED  Social Needs  . Financial resource strain: Not on file  . Food insecurity:    Worry: Not on file    Inability: Not on file  . Transportation needs:    Medical: Not on file    Non-medical: Not on file  Tobacco Use  . Smoking status: Former Research scientist (life sciences)  . Smokeless tobacco: Never Used  Substance and Sexual Activity  . Alcohol use: No  . Drug use: No  . Sexual activity: Not on file  Lifestyle  . Physical activity:    Days per week: Not on file    Minutes per session: Not on file  . Stress: Not on file  Relationships  . Social connections:    Talks on phone: Not on file    Gets together: Not on file    Attends religious service: Not on file    Active member of club or organization: Not on file    Attends meetings of clubs or organizations: Not on file    Relationship status: Not on file  . Intimate partner violence:    Fear of current or ex partner: Not on file    Emotionally abused: Not on file    Physically abused: Not on file    Forced sexual activity: Not on file  Other Topics Concern  . Not on file  Social History Narrative  . Not on file    Functional Status Survey:    Family History  Problem Relation Age of Onset  . Pneumonia Mother   . Heart attack Father     Health Maintenance  Topic Date Due  . OPHTHALMOLOGY EXAM  04/20/2018 (Originally 10/08/2015)  . FOOT EXAM  04/21/2018  (Originally 10/08/2015)  . INFLUENZA VACCINE  08/04/2017  . HEMOGLOBIN A1C  10/07/2017  . TETANUS/TDAP  10/08/2019  . DEXA SCAN  Completed  . PNA vac Low Risk Adult  Completed    Allergies  Allergen Reactions  . Actos [Pioglitazone Hydrochloride] Swelling  . Ativan [Lorazepam]     swelling  . Cephalexin Other (See Comments)    Doesn't remember   . Oxycodone     This medication makes her feel sick  .  Rosiglitazone     unknown  . Tramadol Other (See Comments)    hallucinations    Outpatient Encounter Medications as of 04/25/2017  Medication Sig  . atorvastatin (LIPITOR) 20 MG tablet Take 20 mg by mouth at bedtime.   . calcitRIOL (ROCALTROL) 0.25 MCG capsule Take 0.5 mcg by mouth 2 (two) times daily.   . calcium-vitamin D (OSCAL WITH D) 500-200 MG-UNIT per tablet Take 1 tablet by mouth daily with breakfast.  . daptomycin (CUBICIN) IVPB Inject 650 mg into the vein daily. Indication:  discitis Last Day of Therapy:  May 16, 2017 Labs - Once weekly:  CBC/D, BMP, and CPK Labs - Every other week:  ESR and CRP  . dipyridamole-aspirin (AGGRENOX) 25-200 MG per 12 hr capsule Take 1 capsule by mouth 2 (two) times daily.   . fluticasone (FLONASE) 50 MCG/ACT nasal spray Place 1 spray into the nose daily as needed for allergies.   . furosemide (LASIX) 20 MG tablet Take 20 mg by mouth every other day.   Marland Kitchen HUMULIN N 100 UNIT/ML injection Inject 10 mg into the skin daily before breakfast.   . HYDROcodone-acetaminophen (NORCO/VICODIN) 5-325 MG tablet Take 1 tablet by mouth every 6 (six) hours as needed for moderate pain.  Marland Kitchen levofloxacin (LEVAQUIN) 750 MG tablet Take 1 tablet (750 mg total) by mouth every other day. Till 05/16/17  . levothyroxine (SYNTHROID, LEVOTHROID) 137 MCG tablet Take 137 mcg by mouth daily before breakfast.  . lisinopril (PRINIVIL,ZESTRIL) 40 MG tablet Take 40 mg by mouth daily.   Marland Kitchen LYRICA 75 MG capsule Take 1 capsule (75 mg total) by mouth 3 (three) times daily.  . meclizine  (ANTIVERT) 12.5 MG tablet Take 12.5 mg by mouth daily as needed for dizziness.  . methocarbamol (ROBAXIN) 500 MG tablet Take 1 tablet (500 mg total) by mouth every 6 (six) hours as needed for muscle spasms.  . montelukast (SINGULAIR) 10 MG tablet Take 10 mg by mouth daily as needed.  . nystatin ointment (MYCOSTATIN) Apply 1 application topically 2 (two) times daily as needed for itching.  . ondansetron (ZOFRAN) 4 MG tablet Take 4 mg by mouth 2 (two) times daily as needed for nausea or vomiting.  . polyethylene glycol (MIRALAX / GLYCOLAX) packet Take 17 g by mouth 2 (two) times daily.  Marland Kitchen senna-docusate (SENOKOT-S) 8.6-50 MG tablet Take 1 tablet by mouth 2 (two) times daily.  . simethicone (MYLICON) 80 MG chewable tablet Chew 80 mg by mouth every 8 (eight) hours as needed for flatulence.   . sodium chloride 0.9 % injection Inject 10 mLs into the vein daily. Flush before antibiotic, administer antibiotic and follow with flust  . trimethoprim (TRIMPEX) 100 MG tablet Take 100 mg by mouth daily.  . valACYclovir (VALTREX) 1000 MG tablet TAKE 1000 MG BY MOUTH DAILY AS NEEDED  . [DISCONTINUED] escitalopram (LEXAPRO) 10 MG tablet Take 10 mg by mouth daily.     No facility-administered encounter medications on file as of 04/25/2017.     Review of Systems  Musculoskeletal: Positive for arthralgias.  All other systems reviewed and are negative.   Vitals:   04/25/17 1005  BP: (!) 153/77  Pulse: 80  Resp: 20  Temp: 98 F (36.7 C)  SpO2: 99%  Weight: 253 lb (114.8 kg)  Height: _0  (1.651 m)   Body mass index is 42.1 kg/m. Physical Exam  Constitutional: She is oriented to person, place, and time. She appears well-developed and well-nourished.  Sitting in w/c in NAD  HENT:  Mouth/Throat: Oropharynx is clear and moist. No oropharyngeal exudate.  MMM; no oral thrush  Eyes: Pupils are equal, round, and reactive to light. No scleral icterus.  Neck: Neck supple. Carotid bruit is not present. No  tracheal deviation present. No thyromegaly present.  Cardiovascular: Normal rate, regular rhythm and intact distal pulses. Exam reveals no gallop and no friction rub.  Murmur (1/6 SEM) heard. No LE edema b/l. No calf TTP Left IJ PICC line intact with no redness or d/c at insertions site  Pulmonary/Chest: Effort normal and breath sounds normal. No stridor. No respiratory distress. She has no wheezes. She has no rales.  Abdominal: Soft. Normal appearance and bowel sounds are normal. She exhibits no distension and no mass. There is no hepatomegaly. There is no tenderness. There is no rigidity, no rebound and no guarding. No hernia.  obese  Musculoskeletal: She exhibits edema and tenderness.  Spinous process TTP in lumbar spine  Lymphadenopathy:    She has no cervical adenopathy.  Neurological: She is alert and oriented to person, place, and time. She has normal reflexes.  Skin: Skin is warm and dry. No rash noted.  Psychiatric: She has a normal mood and affect. Her behavior is normal. Judgment and thought content normal.    Labs reviewed: Basic Metabolic Panel: Recent Labs    04/06/17 0609 04/07/17 0600 04/08/17 0543 04/22/17  NA 138 142 139 139  K 4.1 4.3 4.0 4.6  CL 103 106 105  --   CO2 _0 --   GLUCOSE 140* 127* 132*  --   BUN 25* 24* 20 26*  CREATININE 1.36* 1.43* 1.35* 1.7*  CALCIUM 8.5* 8.6* 8.1*  --    Liver Function Tests: Recent Labs    02/08/17 1244 04/02/17 0823  AST 21 14*  ALT 15 11*  ALKPHOS 52 48  BILITOT 0.7 0.4  PROT 7.3 7.1  ALBUMIN 4.0 3.9   No results for input(s): LIPASE, AMYLASE in the last 8760 hours. No results for input(s): AMMONIA in the last 8760 hours. CBC: Recent Labs    04/06/17 0609 04/07/17 0600 04/08/17 0543 04/22/17  WBC 11.8* 11.6* 11.3* 9.5  NEUTROABS  --  8.7* 8.4* 7  HGB 8.8* 9.1* 8.9* 9.4*  HCT 27.2* 28.3* 27.4* 28*  MCV 96.8 95.3 95.8  --   PLT 285 278 278 316   Cardiac Enzymes: Recent Labs    04/07/17 0600    CKTOTAL 43   BNP: Invalid input(s): POCBNP Lab Results  Component Value Date   HGBA1C 6.6 (H) 04/07/2017   Lab Results  Component Value Date   TSH 1.011 04/08/2017   Lab Results  Component Value Date   VITAMINB12 469 04/06/2017   Lab Results  Component Value Date   FOLATE 19.5 04/06/2017   Lab Results  Component Value Date   IRON 35 04/06/2017   TIBC 251 04/06/2017   FERRITIN 60 04/06/2017    Imaging and Procedures obtained prior to SNF admission: No results found.  Assessment/Plan   ICD-10-CM   1. Increased creatine kinase level R74.8   2. Osteomyelitis of thoracic spine (HCC) M46.24    T10-T11  3. Anemia, chronic disease D63.8   4. Hypertension associated with diabetes ([place]) E11.59    I10   5. Type 2 diabetes mellitus with stage 3 chronic kidney disease, with long-term current use of insulin (HCC) E11.22    N18.3    Z79.4   6. Dyslipidemia associated with type 2 diabetes mellitus (  [place]) E11.69    E78.5   7. Postoperative hypothyroidism E89.0   8. History of CVA with residual deficit I69.30     D/c lipitor 2/2 elevated CK  Cont other meds as ordered. STOP DATE ON IV DAPTOMYCIN 05/16/17  PT/OT/ST as ordered  F/u with specialists as scheduled  Wound care as ordered  GOAL: short term rehab and d/c home when medically appropriate. Communicated with pt and nursing.  Will follow  Labs/tests ordered: monitor CK, cbc and Cr  Nurah Petrides S. Angelina Neece, [facility] [place], [place] [address] [phone] Cell (Monday-Friday 8 AM - 5 PM) [phone] After 5 PM and follow prompts

## 2017-04-27 ENCOUNTER — Non-Acute Institutional Stay (SKILLED_NURSING_FACILITY): Payer: Medicare Other | Admitting: Adult Health

## 2017-04-27 ENCOUNTER — Encounter: Payer: Self-pay | Admitting: Adult Health

## 2017-04-27 DIAGNOSIS — Z794 Long term (current) use of insulin: Secondary | ICD-10-CM | POA: Diagnosis not present

## 2017-04-27 DIAGNOSIS — E890 Postprocedural hypothyroidism: Secondary | ICD-10-CM

## 2017-04-27 DIAGNOSIS — E1159 Type 2 diabetes mellitus with other circulatory complications: Secondary | ICD-10-CM

## 2017-04-27 DIAGNOSIS — E119 Type 2 diabetes mellitus without complications: Secondary | ICD-10-CM | POA: Diagnosis not present

## 2017-04-27 DIAGNOSIS — E785 Hyperlipidemia, unspecified: Secondary | ICD-10-CM | POA: Diagnosis not present

## 2017-04-27 DIAGNOSIS — E1169 Type 2 diabetes mellitus with other specified complication: Secondary | ICD-10-CM | POA: Diagnosis not present

## 2017-04-27 DIAGNOSIS — I10 Essential (primary) hypertension: Secondary | ICD-10-CM

## 2017-04-27 NOTE — Progress Notes (Signed)
Location:   [facility] Room Number: 126 A Place of Service:  SNF (31)   CODE STATUS: Full code  Allergies  Allergen Reactions  . Actos [Pioglitazone Hydrochloride] Swelling  . Ativan [Lorazepam]     swelling  . Cephalexin Other (See Comments)    Doesn't remember   . Oxycodone     This medication makes her feel sick  . Rosiglitazone     unknown  . Tramadol Other (See Comments)    hallucinations    Chief Complaint  Patient presents with  . Medical Management of Chronic Issues    *hypertension; diabetes; hypothyroidism; dyslipidemia; weekly follow up for the first 30 days post hospitalization     HPI:  She is a short term rehab patient being seen for the management of her chronic illnesses: diabetes; hypertension; hypothyroidism; dyslipidemia. She is not complaining of pain at this time. She does complain of diarrhea from her laxatives and complains of heart burn. There are no nursing concerns at this time.   Past Medical History:  Diagnosis Date  . Anemia   . Asthma   . Chest pain, atypical   . Diabetes mellitus   . GERD (gastroesophageal reflux disease)   . History of transient ischemic attack (TIA)   . Hyperlipidemia   . Hypertension   . OA (osteoarthritis)   . Thyroid carcinoma [facility])     Past Surgical History:  Procedure Laterality Date  . BACK SURGERY    . BREAST BIOPSY    . CARDIAC CATHETERIZATION  03/18/2009   NORMAL LEFT VENTRICULAR SIZE AND CONTRACTILITY WITH NORMAL SYSTOLIC  FUNCTION. EF 60%  . CARDIOLITE STUDY     SHOWED A SIGNIFICANT REVERSIBLE ANTERIOR WALL DEFECT CONSISTENT WITH ISCHEMIA. EF 55%  . FEMUR IM NAIL Right 04/25/2015   Procedure: INTRAMEDULLARY (IM) RIGHT  RETROGRADE FEMORAL NAILING;  Surgeon: Rod Can, MD;  Location: [facility];  Service: Orthopedics;  Laterality: Right;  . IR FL GUIDED LOC OF NEEDLE/CATH TIP FOR SPINAL INJECTION RT  04/05/2017  . IR US GUIDE VASC ACCESS RIGHT  04/08/2017  . LUMBAR FUSION    .  THYROIDECTOMY    . TOTAL KNEE ARTHROPLASTY     right    Social History   Socioeconomic History  . Marital status: Widowed    Spouse name: Not on file  . Number of children: 5  . Years of education: Not on file  . Highest education level: Not on file  Occupational History    Employer: RETIRED  Social Needs  . Financial resource strain: Not on file  . Food insecurity:    Worry: Not on file    Inability: Not on file  . Transportation needs:    Medical: Not on file    Non-medical: Not on file  Tobacco Use  . Smoking status: Former Research scientist (life sciences)  . Smokeless tobacco: Never Used  Substance and Sexual Activity  . Alcohol use: No  . Drug use: No  . Sexual activity: Not on file  Lifestyle  . Physical activity:    Days per week: Not on file    Minutes per session: Not on file  . Stress: Not on file  Relationships  . Social connections:    Talks on phone: Not on file    Gets together: Not on file    Attends religious service: Not on file    Active member of club or organization: Not on file    Attends meetings of clubs or organizations: Not on  file    Relationship status: Not on file  . Intimate partner violence:    Fear of current or ex partner: Not on file    Emotionally abused: Not on file    Physically abused: Not on file    Forced sexual activity: Not on file  Other Topics Concern  . Not on file  Social History Narrative  . Not on file   Family History  Problem Relation Age of Onset  . Pneumonia Mother   . Heart attack Father       VITAL SIGNS BP 127/69   Pulse 64   Temp (!) 97.2 F (36.2 C)   Resp 18   Ht '5\' 5"'  (1.651 m)   Wt 243 lb 12.8 oz (110.6 kg)   SpO2 96%   BMI 40.57 kg/m   Outpatient Encounter Medications as of 04/27/2017  Medication Sig  . calcitRIOL (ROCALTROL) 0.25 MCG capsule Take 0.25 mcg by mouth daily.   . calcium-vitamin D (OSCAL WITH D) 500-200 MG-UNIT per tablet Take 1 tablet by mouth daily with breakfast.  . daptomycin (CUBICIN) IVPB  Inject 650 mg into the vein daily. Indication:  discitis Last Day of Therapy:  May 16, 2017 Labs - Once weekly:  CBC/D, BMP, and CPK Labs - Every other week:  ESR and CRP  . dipyridamole-aspirin (AGGRENOX) 25-200 MG per 12 hr capsule Take 1 capsule by mouth 2 (two) times daily.   . fluticasone (FLONASE) 50 MCG/ACT nasal spray Place 1 spray into the nose 2 (two) times daily as needed for allergies.   . furosemide (LASIX) 20 MG tablet Take 20 mg by mouth every other day.   Marland Kitchen HUMULIN N 100 UNIT/ML injection Inject 10 mg into the skin daily before breakfast.   . HYDROcodone-acetaminophen (NORCO/VICODIN) 5-325 MG tablet Take 1 tablet by mouth every 6 (six) hours as needed for moderate pain.  Marland Kitchen levofloxacin (LEVAQUIN) 750 MG tablet Take 1 tablet (750 mg total) by mouth every other day. Till 05/16/17  . levothyroxine (SYNTHROID, LEVOTHROID) 137 MCG tablet Take 137 mcg by mouth daily before breakfast.  . lidocaine (LIDODERM) 5 % Apply to right knee topically one time daily. Remove & Discard patch within 12 hours or as directed by MD  . lisinopril (PRINIVIL,ZESTRIL) 40 MG tablet Take 40 mg by mouth daily.   Marland Kitchen LYRICA 75 MG capsule Take 1 capsule (75 mg total) by mouth 3 (three) times daily.  . meclizine (ANTIVERT) 12.5 MG tablet Take 12.5 mg by mouth daily as needed for dizziness.  . methocarbamol (ROBAXIN) 500 MG tablet Take 1 tablet (500 mg total) by mouth every 6 (six) hours as needed for muscle spasms.  . montelukast (SINGULAIR) 10 MG tablet Take 10 mg by mouth daily as needed.  . nystatin ointment (MYCOSTATIN) Apply 1 application topically 2 (two) times daily as needed for itching.  . ondansetron (ZOFRAN) 4 MG tablet Take 4 mg by mouth 2 (two) times daily as needed for nausea or vomiting.  . polyethylene glycol (MIRALAX / GLYCOLAX) packet Take 17 g by mouth 2 (two) times daily.  Marland Kitchen senna-docusate (SENOKOT-S) 8.6-50 MG tablet Take 1 tablet by mouth 2 (two) times daily.  . simethicone (MYLICON) 80 MG  chewable tablet Chew 80 mg by mouth every 8 (eight) hours as needed for flatulence.   . sodium chloride 0.9 % injection Inject 10 mLs into the vein daily. Flush before antibiotic, administer antibiotic and follow with flust  . trimethoprim (TRIMPEX) 100 MG tablet Take 100  mg by mouth daily.  . valACYclovir (VALTREX) 1000 MG tablet TAKE 1000 MG BY MOUTH DAILY AS NEEDED  . [DISCONTINUED] atorvastatin (LIPITOR) 20 MG tablet Take 20 mg by mouth at bedtime.   . [DISCONTINUED] escitalopram (LEXAPRO) 10 MG tablet Take 10 mg by mouth daily.     No facility-administered encounter medications on file as of 04/27/2017.      SIGNIFICANT DIAGNOSTIC EXAMS  PREVIOUS;   04-01-17: MRI thoracic spine: 1. Endplate changes in fluid in the disc space at T10-11 is highly concerning for disc osteomyelitis. Follow-up MRI of the thoracic spine with contrast could be used to better define the extent of infection. 2. Slight retrolisthesis is present at T10-11 with mild central canal stenosis. 3. Moderate foraminal stenosis bilaterally at T10-11. 4. No other focal disc protrusion or stenosis.  NO NEW EXAMS   LABS REVIEWED: PREVIOUS   04-02-17: wbc 10.4; hgb 9.2; hct 29.0; mcv 97.2; plt  339; glucose 149; bun 27; creat 1.62; k+ 4.0p na++ 142; ca 9.1; liver normal albumin 3.9; blood culture: negative 04-05-17: wbc 9.8; hgb 9.3; hct 28.5; mcv 95.0; plt 286; glucose 135; bun 18; creat 1.27; k+ 3.7; na++ 141; ca 8.8 04-06-17: wbc 11.8; hgb 8.8; hct 27.2; mcv 96.8; plt 285; glucose 140; bun 25; creat 1.36; k+ 4.1; na++ 138; ca 8.5; vit B 12: 469; folate 19.5; iron 35; tibc 251; ferritin 60 04-07-17: hgb a1c 6.6 04-08-17: tsh 1.011; free T4: 1.04   NO NEW LABS     Review of Systems  Constitutional: Negative for malaise/fatigue.  Respiratory: Negative for cough and shortness of breath.   Cardiovascular: Negative for chest pain, palpitations and leg swelling.  Gastrointestinal: Positive for diarrhea and heartburn. Negative  for abdominal pain.  Musculoskeletal: Negative for back pain, joint pain and myalgias.  Skin: Negative.   Neurological: Negative for dizziness.  Psychiatric/Behavioral: The patient is not nervous/anxious.     Physical Exam  Constitutional: She is oriented to person, place, and time. She appears well-developed and well-nourished. No distress.  Neck: No thyromegaly present.  Cardiovascular: Normal rate, regular rhythm, normal heart sounds and intact distal pulses.  Pulmonary/Chest: Effort normal and breath sounds normal. No respiratory distress.  Abdominal: Soft. Bowel sounds are normal. She exhibits no distension. There is no tenderness.  Musculoskeletal: She exhibits edema.  Able to move all extremities 1+ bilateral lower extremity edema   Lymphadenopathy:    She has no cervical adenopathy.  Neurological: She is alert and oriented to person, place, and time.  Skin: Skin is warm and dry. She is not diaphoretic.  Psychiatric: She has a normal mood and affect.    ASSESSMENT/ PLAN:  TODAY;   1. Benign essential hypertension/hypertension associated with diabetes: stable b/p 127/69: will continue lisinopril 40 mg daily   2. Insulin dependent type 2 diabetes mellitus, controlled: stable hgb a1c 6.6: will continue humulin N 10 units in the AM is on asa ace and statin   3. Dyslipidemia associated with type 2 diabetes mellitus: stable will continue lipitor 20 mg daily   4. Post operative hypothyroidism: status post thyroidectomy due thyroid cancer: is stable tsh 1.011 with free T4: 1.04; will continue synthroid 137 mcg daily    PREVIOUS  5. 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  Peripheral autonomic neuropathy due to diabetes mellitus type 2: is stable will continue lyrica 75 mg twice daily   8. Osteomyelitis thoracic spine/thoacic  discitis: is without change: will continue daptomycin 650 mg daily and  levaquin 750 mg daily through 05-16-17.   9.  Recurrent UTI: without recent infections: will continue trimpex 100 gm daily for suppression therapy.   10. Slow transit constipation; worse; will change both her miralax and senna to as needed basis  11. Bilateral lower extremity edema: stable will continue lasix 20 mg every other day   12. Allergic rhinitis: stable will continue flonase daily as needed and singulair 10 mg daily as needed  13.  Chronic back pain; status post lumbar spinal fusion: stable will continue vicodin 5/325 mg every 6 hours as needed and robaxin 500 mg every 6 hours as needed   14. Hypertensive renal disease with renal failure stage 1-4: stable bun 25; creat 1.36; will continue calcitriol 0.25 mcg twice daily   15. GERD without esophagitis: worse; will begin prilosec 20 mg daily   MD is aware of resident's narcotic use and is in agreement with current plan of care. We will attempt to wean resident as apropriate   Ok Edwards NP [facility]  Contact [phone] Monday through Friday 8am- 5pm  After hours call [phone]

## 2017-04-29 LAB — CBC AND DIFFERENTIAL
HEMATOCRIT: 28 — AB (ref 36–46)
Hemoglobin: 9 — AB (ref 12.0–16.0)
NEUTROS ABS: 6
PLATELETS: 284 (ref 150–399)
WBC: 8.5

## 2017-04-29 LAB — HEPATIC FUNCTION PANEL
ALT: 20 (ref 7–35)
AST: 18 (ref 13–35)
Alkaline Phosphatase: 49 (ref 25–125)
BILIRUBIN, TOTAL: 0.2

## 2017-04-29 LAB — BASIC METABOLIC PANEL
BUN: 26 — AB (ref 4–21)
Creatinine: 1.7 — AB (ref 0.5–1.1)
GLUCOSE: 121
Potassium: 4.3 (ref 3.4–5.3)
Sodium: 139 (ref 137–147)

## 2017-05-02 ENCOUNTER — Encounter (HOSPITAL_COMMUNITY): Payer: Self-pay | Admitting: Radiology

## 2017-05-04 ENCOUNTER — Ambulatory Visit
Admission: RE | Admit: 2017-05-04 | Discharge: 2017-05-04 | Disposition: A | Discharge status: Home / Self Care | Payer: Medicare Other | Source: Ambulatory Visit | Attending: Internal Medicine | Admitting: Internal Medicine

## 2017-05-04 ENCOUNTER — Other Ambulatory Visit: Payer: Self-pay

## 2017-05-04 ENCOUNTER — Non-Acute Institutional Stay (SKILLED_NURSING_FACILITY): Payer: Medicare Other | Admitting: Adult Health

## 2017-05-04 ENCOUNTER — Encounter: Payer: Self-pay | Admitting: Internal Medicine

## 2017-05-04 ENCOUNTER — Ambulatory Visit (INDEPENDENT_AMBULATORY_CARE_PROVIDER_SITE_OTHER): Payer: Medicare Other | Admitting: Internal Medicine

## 2017-05-04 ENCOUNTER — Encounter: Payer: Self-pay | Admitting: Adult Health

## 2017-05-04 VITALS — BP 126/67 | HR 69 | Temp 97.8°F | Wt 249.8 lb

## 2017-05-04 DIAGNOSIS — M4644 Discitis, unspecified, thoracic region: Secondary | ICD-10-CM

## 2017-05-04 DIAGNOSIS — N183 Chronic kidney disease, stage 3 unspecified: Secondary | ICD-10-CM

## 2017-05-04 DIAGNOSIS — M4624 Osteomyelitis of vertebra, thoracic region: Secondary | ICD-10-CM

## 2017-05-04 DIAGNOSIS — N390 Urinary tract infection, site not specified: Secondary | ICD-10-CM | POA: Diagnosis not present

## 2017-05-04 DIAGNOSIS — Z5181 Encounter for therapeutic drug level monitoring: Secondary | ICD-10-CM | POA: Diagnosis not present

## 2017-05-04 DIAGNOSIS — I639 Cerebral infarction, unspecified: Secondary | ICD-10-CM

## 2017-05-04 DIAGNOSIS — R0781 Pleurodynia: Secondary | ICD-10-CM

## 2017-05-04 DIAGNOSIS — E1143 Type 2 diabetes mellitus with diabetic autonomic (poly)neuropathy: Secondary | ICD-10-CM

## 2017-05-04 LAB — CBC WITH DIFFERENTIAL/PLATELET
BASOS PCT: 0.2 %
Basophils Absolute: 24 cells/uL (ref 0–200)
EOS ABS: 48 {cells}/uL (ref 15–500)
Eosinophils Relative: 0.4 %
HCT: 26.7 % — ABNORMAL LOW (ref 35.0–45.0)
Hemoglobin: 8.9 g/dL — ABNORMAL LOW (ref 11.7–15.5)
Lymphs Abs: 1525 cells/uL (ref 850–3900)
MCH: 30.4 pg (ref 27.0–33.0)
MCHC: 33.3 g/dL (ref 32.0–36.0)
MCV: 91.1 fL (ref 80.0–100.0)
MONOS PCT: 5.9 %
MPV: 10 fL (ref 7.5–12.5)
NEUTROS PCT: 80.9 %
Neutro Abs: 9789 cells/uL — ABNORMAL HIGH (ref 1500–7800)
PLATELETS: 269 10*3/uL (ref 140–400)
RBC: 2.93 10*6/uL — ABNORMAL LOW (ref 3.80–5.10)
RDW: 12.3 % (ref 11.0–15.0)
TOTAL LYMPHOCYTE: 12.6 %
WBC mixed population: 714 cells/uL (ref 200–950)
WBC: 12.1 10*3/uL — AB (ref 3.8–10.8)

## 2017-05-04 MED ORDER — PREGABALIN 100 MG PO CAPS: 100.0000 mg | ORAL_CAPSULE | Freq: Three times a day (TID) | ORAL | 0 refills | Status: DC

## 2017-05-04 MED ORDER — HYDROCODONE-ACETAMINOPHEN 5-325 MG PO TABS: 1.0000 | ORAL_TABLET | ORAL | 0 refills | Status: DC | PRN

## 2017-05-04 NOTE — Progress Notes (Signed)
Location:   [facility] Room Number: 126 A Place of Service:  SNF (31)   CODE STATUS: Full Code  Allergies  Allergen Reactions  . Actos [Pioglitazone Hydrochloride] Swelling  . Ativan [Lorazepam]     swelling  . Cephalexin Other (See Comments)    Doesn't remember   . Oxycodone     This medication makes her feel sick  . Rosiglitazone     unknown  . Tramadol Other (See Comments)    hallucinations    Chief Complaint  Patient presents with  . Medical Management of Chronic Issues    Cva; peripheral neuropathy; osteomyelitis; chronic uti. Weekly follow up for the first 30 days post hospitalization     HPI:  Sh is a 77 year old short term rehab patient of this facility being seen for the management of her chronic illnesses: cva; peripheral neuropathy; osteomyelitis and chronic uti. She continues to participate in therapy. She is complaining of back pain that is shooting in nature. Her current regimen is not effective. She would like her vicodin increased. We did discuss this; increasing her vicodin is not appropriate at this time. The dose could increase sedation and increase potential to falls. She did agree with this and is willing to try increased dose of lyrica. She denies any changes in appetite and no fevers present. There are no nursing concerns at this time.   Past Medical History:  Diagnosis Date  . Anemia   . Asthma   . Chest pain, atypical   . Diabetes mellitus   . GERD (gastroesophageal reflux disease)   . History of transient ischemic attack (TIA)   . Hyperlipidemia   . Hypertension   . OA (osteoarthritis)   . Thyroid carcinoma [facility])     Past Surgical History:  Procedure Laterality Date  . BACK SURGERY    . BREAST BIOPSY    . CARDIAC CATHETERIZATION  03/18/2009   NORMAL LEFT VENTRICULAR SIZE AND CONTRACTILITY WITH NORMAL SYSTOLIC  FUNCTION. EF 60%  . CARDIOLITE STUDY     SHOWED A SIGNIFICANT REVERSIBLE ANTERIOR WALL DEFECT CONSISTENT WITH  ISCHEMIA. EF 55%  . FEMUR IM NAIL Right 04/25/2015   Procedure: INTRAMEDULLARY (IM) RIGHT  RETROGRADE FEMORAL NAILING;  Surgeon: Rod Can, MD;  Location: [facility] ORS;  Service: Orthopedics;  Laterality: Right;  . IR FL GUIDED LOC OF NEEDLE/CATH TIP FOR SPINAL INJECTION RT  04/05/2017  . IR FLUORO GUIDE CV LINE RIGHT  04/08/2017  . IR US GUIDE VASC ACCESS RIGHT  04/08/2017  . LUMBAR FUSION    . THYROIDECTOMY    . TOTAL KNEE ARTHROPLASTY     right    Social History   Socioeconomic History  . Marital status: Widowed    Spouse name: Not on file  . Number of children: 5  . Years of education: Not on file  . Highest education level: Not on file  Occupational History    Employer: RETIRED  Social Needs  . Financial resource strain: Not on file  . Food insecurity:    Worry: Not on file    Inability: Not on file  . Transportation needs:    Medical: Not on file    Non-medical: Not on file  Tobacco Use  . Smoking status: Former Research scientist (life sciences)  . Smokeless tobacco: Never Used  Substance and Sexual Activity  . Alcohol use: No  . Drug use: No  . Sexual activity: Not on file  Lifestyle  . Physical activity:    Days  per week: Not on file    Minutes per session: Not on file  . Stress: Not on file  Relationships  . Social connections:    Talks on phone: Not on file    Gets together: Not on file    Attends religious service: Not on file    Active member of club or organization: Not on file    Attends meetings of clubs or organizations: Not on file    Relationship status: Not on file  . Intimate partner violence:    Fear of current or ex partner: Not on file    Emotionally abused: Not on file    Physically abused: Not on file    Forced sexual activity: Not on file  Other Topics Concern  . Not on file  Social History Narrative  . Not on file   Family History  Problem Relation Age of Onset  . Pneumonia Mother   . Heart attack Father       VITAL SIGNS BP 136/76   Pulse 78   Temp 98.1  F (36.7 C)   Resp 18   Ht '5\' 5"'  (1.651 m)   Wt 243 lb 12.8 oz (110.6 kg)   SpO2 96%   BMI 40.57 kg/m   Outpatient Encounter Medications as of 05/04/2017  Medication Sig  . calcitRIOL (ROCALTROL) 0.25 MCG capsule Take 0.25 mcg by mouth daily.   . Calcium Carbonate Antacid (TUMS PO) Take 2 tablets by mouth 2 (two) times daily as needed.  . calcium-vitamin D (OSCAL WITH D) 500-200 MG-UNIT per tablet Take 1 tablet by mouth daily with breakfast.  . daptomycin (CUBICIN) IVPB Inject 650 mg into the vein daily. Indication:  discitis Last Day of Therapy:  May 16, 2017 Labs - Once weekly:  CBC/D, BMP, and CPK Labs - Every other week:  ESR and CRP  . dipyridamole-aspirin (AGGRENOX) 25-200 MG per 12 hr capsule Take 1 capsule by mouth 2 (two) times daily.   . fluticasone (FLONASE) 50 MCG/ACT nasal spray Place 1 spray into the nose 2 (two) times daily as needed for allergies.   . furosemide (LASIX) 20 MG tablet Take 20 mg by mouth every other day.   Marland Kitchen HUMULIN N 100 UNIT/ML injection Inject 10 mg into the skin daily before breakfast.   . HYDROcodone-acetaminophen (NORCO/VICODIN) 5-325 MG tablet Take 1 tablet by mouth every 6 (six) hours as needed for moderate pain.  Marland Kitchen levofloxacin (LEVAQUIN) 750 MG tablet Take 1 tablet (750 mg total) by mouth every other day. Till 05/16/17  . levothyroxine (SYNTHROID, LEVOTHROID) 137 MCG tablet Take 137 mcg by mouth daily before breakfast.  . lidocaine (LIDODERM) 5 % Apply to right knee topically one time daily. Remove & Discard patch within 12 hours or as directed by MD  . lisinopril (PRINIVIL,ZESTRIL) 40 MG tablet Take 40 mg by mouth daily.   Marland Kitchen LYRICA 75 MG capsule Take 1 capsule (75 mg total) by mouth 3 (three) times daily.  . meclizine (ANTIVERT) 12.5 MG tablet Take 12.5 mg by mouth daily as needed for dizziness.  . methocarbamol (ROBAXIN) 500 MG tablet Take 1 tablet (500 mg total) by mouth every 6 (six) hours as needed for muscle spasms.  . montelukast (SINGULAIR)  10 MG tablet Take 10 mg by mouth daily as needed.  . nystatin ointment (MYCOSTATIN) Apply 1 application topically 2 (two) times daily as needed for itching.  Marland Kitchen omeprazole (PRILOSEC) 20 MG capsule Take 20 mg by mouth at bedtime.  . ondansetron (  ZOFRAN) 4 MG tablet Take 4 mg by mouth 2 (two) times daily as needed for nausea or vomiting.  . polyethylene glycol (MIRALAX / GLYCOLAX) packet Take 17 g by mouth 2 (two) times daily.  Marland Kitchen senna (SENOKOT) 8.6 MG TABS tablet Take 1 tablet by mouth 2 (two) times daily as needed for mild constipation.  . simethicone (MYLICON) 80 MG chewable tablet Chew 80 mg by mouth every 8 (eight) hours as needed for flatulence.   . sodium chloride 0.9 % injection Inject 10 mLs into the vein daily. Flush before antibiotic, administer antibiotic and follow with flust  . trimethoprim (TRIMPEX) 100 MG tablet Take 100 mg by mouth daily.  . valACYclovir (VALTREX) 1000 MG tablet TAKE 1000 MG BY MOUTH DAILY AS NEEDED  . [DISCONTINUED] escitalopram (LEXAPRO) 10 MG tablet Take 10 mg by mouth daily.    . [DISCONTINUED] senna-docusate (SENOKOT-S) 8.6-50 MG tablet Take 1 tablet by mouth 2 (two) times daily. (Patient not taking: Reported on 05/04/2017)   No facility-administered encounter medications on file as of 05/04/2017.      SIGNIFICANT DIAGNOSTIC EXAMS   PREVIOUS;   04-01-17: MRI thoracic spine: 1. Endplate changes in fluid in the disc space at T10-11 is highly concerning for disc osteomyelitis. Follow-up MRI of the thoracic spine with contrast could be used to better define the extent of infection. 2. Slight retrolisthesis is present at T10-11 with mild central canal stenosis. 3. Moderate foraminal stenosis bilaterally at T10-11. 4. No other focal disc protrusion or stenosis.  NO NEW EXAMS   LABS REVIEWED: PREVIOUS   04-02-17: wbc 10.4; hgb 9.2; hct 29.0; mcv 97.2; plt  339; glucose 149; bun 27; creat 1.62; k+ 4.0p na++ 142; ca 9.1; liver normal albumin 3.9; blood culture:  negative 04-05-17: wbc 9.8; hgb 9.3; hct 28.5; mcv 95.0; plt 286; glucose 135; bun 18; creat 1.27; k+ 3.7; na++ 141; ca 8.8 04-06-17: wbc 11.8; hgb 8.8; hct 27.2; mcv 96.8; plt 285; glucose 140; bun 25; creat 1.36; k+ 4.1; na++ 138; ca 8.5; vit B 12: 469; folate 19.5; iron 35; tibc 251; ferritin 60 04-07-17: hgb a1c 6.6 04-08-17: tsh 1.011; free T4: 1.04   NO NEW LABS      Review of Systems  Constitutional: Negative for malaise/fatigue.  Respiratory: Negative for cough and shortness of breath.   Cardiovascular: Negative for chest pain, palpitations and leg swelling.  Gastrointestinal: Negative for abdominal pain, constipation and heartburn.  Musculoskeletal: Positive for back pain and myalgias. Negative for joint pain.       Has shooting pain  Skin: Negative.   Neurological: Negative for dizziness.  Psychiatric/Behavioral: The patient is not nervous/anxious.     Physical Exam  Constitutional: She is oriented to person, place, and time. She appears well-developed and well-nourished. No distress.  Neck: No thyromegaly present.  Cardiovascular: Normal rate, regular rhythm, normal heart sounds and intact distal pulses.  Pulmonary/Chest: Effort normal and breath sounds normal. No respiratory distress.  Abdominal: Soft. Bowel sounds are normal. She exhibits no distension. There is no tenderness.  Musculoskeletal: She exhibits edema.  Able to move all extremities 1+ bilateral lower extremity edema   Lymphadenopathy:    She has no cervical adenopathy.  Neurological: She is alert and oriented to person, place, and time.  Skin: Skin is warm and dry. She is not diaphoretic.  Psychiatric: She has a normal mood and affect.   ASSESSMENT/ PLAN:  TODAY;   1. CVA with involvement of right side of body: is neurologically  stable: will continue agggrenox 25/100 mg twice daily   2.  Peripheral autonomic neuropathy due to diabetes mellitus type 2: is stable will continue lyrica 75 mg twice daily   3.  Osteomyelitis thoracic spine/thoacic discitis: is without change: will continue daptomycin 650 mg daily and levaquin 750 mg daily through 05-16-17.   4.  Recurrent UTI: without recent infections: will continue trimpex 100 gm daily for suppression therapy.   PREVIOUS  5. Slow transit constipation; stable will continue miralax and senna to as needed basis  6. Bilateral lower extremity edema: stable will continue lasix 20 mg every other day   7. Allergic rhinitis: stable will continue flonase daily as needed and singulair 10 mg daily as needed  8.  Chronic back pain; status post lumbar spinal fusion: stable will continue vicodin 5/325 mg every 6 hours as needed and robaxin 500 mg every 6 hours as needed   9. Hypertensive renal disease with renal failure stage 1-4: stable bun 25; creat 1.36; will continue calcitriol 0.25 mcg twice daily   10. GERD without esophagitis: worse; will begin prilosec 20 mg daily   11. Benign essential hypertension/hypertension associated with diabetes: stable b/p 136/76: will continue lisinopril 40 mg daily   12. Insulin dependent type 2 diabetes mellitus, controlled: stable hgb a1c 6.6: will continue humulin N 10 units in the AM is on asa ace and statin   13. Dyslipidemia associated with type 2 diabetes mellitus: stable will continue lipitor 20 mg daily   14. Post operative hypothyroidism: status post thyroidectomy due thyroid cancer: is stable tsh 1.011 with free T4: 1.04; will continue synthroid 137 mcg daily          MD is aware of resident's narcotic use and is in agreement with current plan of care. We will attempt to wean resident as apropriate   Ok Edwards NP [facility]  Contact [phone] Monday through Friday 8am- 5pm  After hours call [phone]

## 2017-05-04 NOTE — Progress Notes (Signed)
   Subjective:    Patient ID: [name], female    DOB: 09-22-1940, 77 y.o.   MRN: [id number]  HPI Here for follow up of discitis.   She has a history of lumbar surgeries and developed more acute new back pain and MRI c/w acute T10-11 discitis with osteomyelitis.  She underwent aspiration but culture remained negative.  I put her on daptomycin and oral levaquin for a projected 6 weeks through May 13.  No associated rash, diarrhea.  She is complaining of worsening right flank pain.  No recent fall or exacerbating factors.  Pleuritic in nature.    Review of Systems  Constitutional: Negative for fatigue.  Gastrointestinal: Negative for diarrhea.  Skin: Negative for rash.       Objective:   Physical Exam  Constitutional: She appears well-developed and well-nourished.  Eyes: No scleral icterus.  Cardiovascular: Normal rate, regular rhythm and normal heart sounds.  No murmur heard. Pulmonary/Chest: Effort normal and breath sounds normal.  Musculoskeletal:  No thoracic tenderness, no warmth  Lymphadenopathy:    She has no cervical adenopathy.  Skin: No rash noted.   SH: staying at a SNF       Assessment & Plan:

## 2017-05-04 NOTE — Telephone Encounter (Signed)
rx faxed to [facility] (p) [phone] ([phone]

## 2017-05-04 NOTE — Assessment & Plan Note (Signed)
I will check a CK on daptomycin.  I do not see that is has been done to date.   Also will check inflammatory markers, creat.

## 2017-05-04 NOTE — Assessment & Plan Note (Signed)
Checked CXR for concern for rib fracture but negative. Most c/w msk costocondritis.

## 2017-05-04 NOTE — Telephone Encounter (Signed)
rx faxed to [facility] (p) [phone] ([phone]

## 2017-05-04 NOTE — Assessment & Plan Note (Signed)
Will continue for 6 weeks.  Will check ESR and CRP today.  Not having any back pain so seems to be responding to treatment.  Cultures remained negative.  rtc 2 weeks and will consider stopping treatment then if labs clinical exam remain reassuring.

## 2017-05-04 NOTE — Assessment & Plan Note (Signed)
Avoiding vancomycin due to chronic disease.

## 2017-05-05 ENCOUNTER — Telehealth: Payer: Self-pay | Admitting: *Deleted

## 2017-05-05 LAB — COMPREHENSIVE METABOLIC PANEL
AG Ratio: 1.6 (calc) (ref 1.0–2.5)
ALKALINE PHOSPHATASE (APISO): 46 U/L (ref 33–130)
ALT: 12 U/L (ref 6–29)
AST: 15 U/L (ref 10–35)
Albumin: 3.8 g/dL (ref 3.6–5.1)
BUN/Creatinine Ratio: 12 (calc) (ref 6–22)
BUN: 24 mg/dL (ref 7–25)
CO2: 29 mmol/L (ref 20–32)
CREATININE: 1.96 mg/dL — AB (ref 0.60–0.93)
Calcium: 8.5 mg/dL — ABNORMAL LOW (ref 8.6–10.4)
Chloride: 103 mmol/L (ref 98–110)
GLOBULIN: 2.4 g/dL (ref 1.9–3.7)
GLUCOSE: 148 mg/dL — AB (ref 65–99)
Potassium: 4.7 mmol/L (ref 3.5–5.3)
Sodium: 140 mmol/L (ref 135–146)
Total Bilirubin: 0.4 mg/dL (ref 0.2–1.2)
Total Protein: 6.2 g/dL (ref 6.1–8.1)

## 2017-05-05 LAB — C-REACTIVE PROTEIN: CRP: 27.1 mg/L — AB (ref <8.0)

## 2017-05-05 LAB — CK: CK TOTAL: 252 U/L — AB (ref 29–143)

## 2017-05-05 LAB — SEDIMENTATION RATE: SED RATE: 41 mm/h — AB (ref 0–30)

## 2017-05-05 NOTE — Telephone Encounter (Signed)
-----   Message from Thayer Headings, MD sent at 05/04/2017  5:03 PM EDT ----- Could you let her know that her CXR is fine, no rib fracture or explanation of her right flank pain.   thanks

## 2017-05-05 NOTE — Telephone Encounter (Signed)
Relayed chest xray results to Ms. Lebron. She wants to know if her kidneys are any better and how the rest of her labs are looking. Please advise. Landis Gandy, RN

## 2017-05-06 ENCOUNTER — Encounter: Payer: Self-pay | Admitting: Internal Medicine

## 2017-05-06 NOTE — Telephone Encounter (Signed)
Left her a voice mail.

## 2017-05-06 NOTE — Telephone Encounter (Signed)
Kidneys are about the same.  The rest of the tests reassuring.

## 2017-05-11 ENCOUNTER — Encounter: Payer: Self-pay | Admitting: Adult Health

## 2017-05-11 ENCOUNTER — Non-Acute Institutional Stay (SKILLED_NURSING_FACILITY): Payer: Medicare Other | Admitting: Adult Health

## 2017-05-11 DIAGNOSIS — I10 Essential (primary) hypertension: Secondary | ICD-10-CM

## 2017-05-11 DIAGNOSIS — E1169 Type 2 diabetes mellitus with other specified complication: Secondary | ICD-10-CM | POA: Diagnosis not present

## 2017-05-11 DIAGNOSIS — I152 Hypertension secondary to endocrine disorders: Secondary | ICD-10-CM

## 2017-05-11 DIAGNOSIS — E1159 Type 2 diabetes mellitus with other circulatory complications: Secondary | ICD-10-CM

## 2017-05-11 DIAGNOSIS — I129 Hypertensive chronic kidney disease with stage 1 through stage 4 chronic kidney disease, or unspecified chronic kidney disease: Secondary | ICD-10-CM

## 2017-05-11 DIAGNOSIS — E785 Hyperlipidemia, unspecified: Secondary | ICD-10-CM

## 2017-05-11 DIAGNOSIS — K219 Gastro-esophageal reflux disease without esophagitis: Secondary | ICD-10-CM

## 2017-05-11 NOTE — Progress Notes (Signed)
Location:   [facility] Room Number: 126 A Place of Service:  SNF ([id number])   CODE STATUS: Full code  Allergies  Allergen Reactions  . Pioglitazone Swelling  . Actos [Pioglitazone Hydrochloride] Swelling  . Ativan [Lorazepam]     swelling  . Cephalexin Other (See Comments)    Doesn't remember   . Oxycodone     This medication makes her feel sick  . Rosiglitazone     unknown  . Tramadol Other (See Comments)    hallucinations    Chief Complaint  Patient presents with  . Medical Management of Chronic Issues    Hypertension; dyslipidemia; renal disease gerd. Weekly follow up for the first 30 days post hospitalization     HPI:  She is a 77 year old short term rehab patient being seen for the management of her chronic illnesses; hypertension; dyslipidemia; renal disease and gerd. She denies any back pain today; no heart burn; no change in appetite. There are no nursing concerns at this time.   Past Medical History:  Diagnosis Date  . Anemia   . Asthma   . Chest pain, atypical   . Diabetes mellitus   . GERD (gastroesophageal reflux disease)   . History of transient ischemic attack (TIA)   . Hyperlipidemia   . Hypertension   . OA (osteoarthritis)   . Thyroid carcinoma [facility])     Past Surgical History:  Procedure Laterality Date  . BACK SURGERY    . BREAST BIOPSY    . CARDIAC CATHETERIZATION  03/18/2009   NORMAL LEFT VENTRICULAR SIZE AND CONTRACTILITY WITH NORMAL SYSTOLIC  FUNCTION. EF 60%  . CARDIOLITE STUDY     SHOWED A SIGNIFICANT REVERSIBLE ANTERIOR WALL DEFECT CONSISTENT WITH ISCHEMIA. EF 55%  . FEMUR IM NAIL Right 04/25/2015   Procedure: INTRAMEDULLARY (IM) RIGHT  RETROGRADE FEMORAL NAILING;  Surgeon: Rod Can, MD;  Location: [facility];  Service: Orthopedics;  Laterality: Right;  . IR FL GUIDED LOC OF NEEDLE/CATH TIP FOR SPINAL INJECTION RT  04/05/2017  . IR FLUORO GUIDE CV LINE RIGHT  04/08/2017  . IR US GUIDE VASC ACCESS RIGHT  04/08/2017  . LUMBAR  FUSION    . THYROIDECTOMY    . TOTAL KNEE ARTHROPLASTY     right    Social History   Socioeconomic History  . Marital status: Widowed    Spouse name: Not on file  . Number of children: 5  . Years of education: Not on file  . Highest education level: Not on file  Occupational History    Employer: RETIRED  Social Needs  . Financial resource strain: Not on file  . Food insecurity:    Worry: Not on file    Inability: Not on file  . Transportation needs:    Medical: Not on file    Non-medical: Not on file  Tobacco Use  . Smoking status: Former Research scientist (life sciences)  . Smokeless tobacco: Never Used  Substance and Sexual Activity  . Alcohol use: No  . Drug use: No  . Sexual activity: Not on file  Lifestyle  . Physical activity:    Days per week: Not on file    Minutes per session: Not on file  . Stress: Not on file  Relationships  . Social connections:    Talks on phone: Not on file    Gets together: Not on file    Attends religious service: Not on file    Active member of club or organization: Not on file  Attends meetings of clubs or organizations: Not on file    Relationship status: Not on file  . Intimate partner violence:    Fear of current or ex partner: Not on file    Emotionally abused: Not on file    Physically abused: Not on file    Forced sexual activity: Not on file  Other Topics Concern  . Not on file  Social History Narrative  . Not on file   Family History  Problem Relation Age of Onset  . Pneumonia Mother   . Heart attack Father       VITAL SIGNS BP 132/62   Pulse 69   Temp (!) 97.4 F (36.3 C)   Resp 18   Ht '5\' 5"'  (1.651 m)   Wt 243 lb 12.8 oz (110.6 kg)   SpO2 96%   BMI 40.57 kg/m   Outpatient Encounter Medications as of 05/11/2017  Medication Sig  . calcitRIOL (ROCALTROL) 0.25 MCG capsule Take 0.25 mcg by mouth daily.   . Calcium Carbonate Antacid (TUMS PO) Take 2 tablets by mouth 2 (two) times daily as needed.  . calcium-vitamin D (OSCAL  WITH D) 500-200 MG-UNIT per tablet Take 1 tablet by mouth daily with breakfast.  . daptomycin (CUBICIN) IVPB Inject 650 mg into the vein daily. Indication:  discitis Last Day of Therapy:  May 16, 2017 Labs - Once weekly:  CBC/D, BMP, and CPK Labs - Every other week:  ESR and CRP  . dipyridamole-aspirin (AGGRENOX) 25-200 MG per 12 hr capsule Take 1 capsule by mouth 2 (two) times daily.   . fluticasone (FLONASE) 50 MCG/ACT nasal spray Place 1 spray into the nose 2 (two) times daily as needed for allergies.   . furosemide (LASIX) 20 MG tablet Take 20 mg by mouth every other day.   Marland Kitchen HUMULIN N 100 UNIT/ML injection Inject 10 mg into the skin daily before breakfast.   . HYDROcodone-acetaminophen (NORCO/VICODIN) 5-325 MG tablet Take 1 tablet by mouth every 4 (four) hours as needed for moderate pain.  Marland Kitchen levofloxacin (LEVAQUIN) 750 MG tablet Take 1 tablet (750 mg total) by mouth every other day. Till 05/16/17  . levothyroxine (SYNTHROID, LEVOTHROID) 137 MCG tablet Take 137 mcg by mouth daily before breakfast.  . lidocaine (LIDODERM) 5 % Apply to right flank topically in the morning and Apply to right knee topically one time daily. 2 Patches.  Remove & Discard patch within 12 hours or as directed by MD  . lisinopril (PRINIVIL,ZESTRIL) 40 MG tablet Take 40 mg by mouth daily.   . meclizine (ANTIVERT) 12.5 MG tablet Take 12.5 mg by mouth daily as needed for dizziness.  . methocarbamol (ROBAXIN) 500 MG tablet Take 1 tablet (500 mg total) by mouth every 6 (six) hours as needed for muscle spasms.  . montelukast (SINGULAIR) 10 MG tablet Take 10 mg by mouth daily as needed.  . nystatin ointment (MYCOSTATIN) Apply 1 application topically 2 (two) times daily as needed for itching.  Marland Kitchen omeprazole (PRILOSEC) 20 MG capsule Take 20 mg by mouth at bedtime.  . ondansetron (ZOFRAN) 4 MG tablet Take 4 mg by mouth 2 (two) times daily as needed for nausea or vomiting.  . polyethylene glycol (MIRALAX / GLYCOLAX) packet Take 17  g by mouth at bedtime.  . pregabalin (LYRICA) 100 MG capsule Take 1 capsule (100 mg total) by mouth every 8 (eight) hours.  . senna (SENOKOT) 8.6 MG TABS tablet Take 1 tablet by mouth 2 (two) times daily as needed  for mild constipation.  . simethicone (MYLICON) 80 MG chewable tablet Chew 80 mg by mouth every 8 (eight) hours as needed for flatulence.   . sodium chloride 0.9 % injection Inject 10 mLs into the vein daily. Flush before antibiotic, administer antibiotic and follow with flust  . trimethoprim (TRIMPEX) 100 MG tablet Take 100 mg by mouth daily.  . valACYclovir (VALTREX) 1000 MG tablet TAKE 1000 MG BY MOUTH DAILY AS NEEDED  . [DISCONTINUED] escitalopram (LEXAPRO) 10 MG tablet Take 10 mg by mouth daily.    . [DISCONTINUED] polyethylene glycol (MIRALAX / GLYCOLAX) packet Take 17 g by mouth 2 (two) times daily. (Patient not taking: Reported on 05/11/2017)   No facility-administered encounter medications on file as of 05/11/2017.      SIGNIFICANT DIAGNOSTIC EXAMS   PREVIOUS;   04-01-17: MRI thoracic spine: 1. Endplate changes in fluid in the disc space at T10-11 is highly concerning for disc osteomyelitis. Follow-up MRI of the thoracic spine with contrast could be used to better define the extent of infection. 2. Slight retrolisthesis is present at T10-11 with mild central canal stenosis. 3. Moderate foraminal stenosis bilaterally at T10-11. 4. No other focal disc protrusion or stenosis.  NO NEW EXAMS   LABS REVIEWED: PREVIOUS   04-02-17: wbc 10.4; hgb 9.2; hct 29.0; mcv 97.2; plt  339; glucose 149; bun 27; creat 1.62; k+ 4.0p na++ 142; ca 9.1; liver normal albumin 3.9; blood culture: negative 04-05-17: wbc 9.8; hgb 9.3; hct 28.5; mcv 95.0; plt 286; glucose 135; bun 18; creat 1.27; k+ 3.7; na++ 141; ca 8.8 04-06-17: wbc 11.8; hgb 8.8; hct 27.2; mcv 96.8; plt 285; glucose 140; bun 25; creat 1.36; k+ 4.1; na++ 138; ca 8.5; vit B 12: 469; folate 19.5; iron 35; tibc 251; ferritin 60 04-07-17:  hgb a1c 6.6 04-08-17: tsh 1.011; free T4: 1.04   NO NEW LABS      Review of Systems  Constitutional: Negative for malaise/fatigue.  Respiratory: Negative for cough and shortness of breath.   Cardiovascular: Negative for chest pain, palpitations and leg swelling.  Gastrointestinal: Negative for abdominal pain, constipation and heartburn.  Musculoskeletal: Negative for back pain, joint pain and myalgias.  Skin: Negative.   Neurological: Negative for dizziness.  Psychiatric/Behavioral: The patient is not nervous/anxious.    Physical Exam  Constitutional: She is oriented to person, place, and time. She appears well-developed and well-nourished. No distress.  Neck: No thyromegaly present.  Cardiovascular: Normal rate, regular rhythm, normal heart sounds and intact distal pulses.  Pulmonary/Chest: Effort normal and breath sounds normal.  Abdominal: Soft. Bowel sounds are normal.  Musculoskeletal: Normal range of motion. She exhibits edema.  Trace lower extremity edema   Lymphadenopathy:    She has no cervical adenopathy.  Neurological: She is alert and oriented to person, place, and time.  Skin: Skin is warm and dry. She is not diaphoretic.  Psychiatric: She has a normal mood and affect.     ASSESSMENT/ PLAN:  TODAY;   1. Hypertensive renal disease with renal failure stage 1-4: stable bun 25; creat 1.36; will continue calcitriol 0.25 mcg twice daily   2. GERD without esophagitis: stable will continue  prilosec 20 mg daily   3. Benign essential hypertension/hypertension associated with diabetes: stable b/p 132/62: will continue lisinopril 40 mg daily   4. Insulin dependent type 2 diabetes mellitus, controlled: stable hgb a1c 6.6: will continue humulin N 10 units in the AM is on asa ace and statin   PREVIOUS  5. Dyslipidemia  associated with type 2 diabetes mellitus: stable will continue lipitor 20 mg daily   6. Post operative hypothyroidism: status post thyroidectomy due thyroid  cancer: is stable tsh 1.011 with free T4: 1.04; will continue synthroid 137 mcg daily   7. 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  Peripheral autonomic neuropathy due to diabetes mellitus type 2: is stable will continue lyrica 75 mg twice daily   9. Osteomyelitis thoracic spine/thoacic discitis: is without change: will continue daptomycin 650 mg daily and levaquin 750 mg daily through 05-16-17.   10.  Recurrent UTI: without recent infections: will continue trimpex 100 mg daily for suppression therapy.   11. Slow transit constipation; stable will continue miralax and senna to as needed basis  12. Bilateral lower extremity edema: stable will continue lasix 20 mg every other day   13. Allergic rhinitis: stable will continue flonase daily as needed and singulair 10 mg daily as needed  14.  Chronic back pain; status post lumbar spinal fusion: stable will continue vicodin 5/325 mg every 6 hours as needed and robaxin 500 mg every 6 hours as needed     MD is aware of resident's narcotic use and is in agreement with current plan of care. We will attempt to wean resident as apropriate   Ok Edwards NP [facility]  Contact [phone] Monday through Friday 8am- 5pm  After hours call [phone]

## 2017-05-12 ENCOUNTER — Other Ambulatory Visit: Payer: Self-pay

## 2017-05-12 ENCOUNTER — Encounter: Payer: Self-pay | Admitting: Adult Health

## 2017-05-12 ENCOUNTER — Non-Acute Institutional Stay (SKILLED_NURSING_FACILITY): Payer: Medicare Other | Admitting: Adult Health

## 2017-05-12 DIAGNOSIS — G061 Intraspinal abscess and granuloma: Secondary | ICD-10-CM | POA: Diagnosis not present

## 2017-05-12 DIAGNOSIS — K219 Gastro-esophageal reflux disease without esophagitis: Secondary | ICD-10-CM | POA: Insufficient documentation

## 2017-05-12 DIAGNOSIS — I129 Hypertensive chronic kidney disease with stage 1 through stage 4 chronic kidney disease, or unspecified chronic kidney disease: Secondary | ICD-10-CM

## 2017-05-12 DIAGNOSIS — I639 Cerebral infarction, unspecified: Secondary | ICD-10-CM | POA: Diagnosis not present

## 2017-05-12 MED ORDER — PREGABALIN 100 MG PO CAPS: 100.0000 mg | ORAL_CAPSULE | Freq: Three times a day (TID) | ORAL | 0 refills | Status: DC

## 2017-05-12 MED ORDER — HYDROCODONE-ACETAMINOPHEN 5-325 MG PO TABS: 1.0000 | ORAL_TABLET | ORAL | 0 refills | Status: DC | PRN

## 2017-05-12 NOTE — Progress Notes (Signed)
Location:   [facility] Room Number: 126 A Place of Service:  SNF (31)    CODE STATUS: Full Code  Allergies  Allergen Reactions  . Pioglitazone Swelling  . Actos [Pioglitazone Hydrochloride] Swelling  . Ativan [Lorazepam]     swelling  . Cephalexin Other (See Comments)    Doesn't remember   . Oxycodone     This medication makes her feel sick  . Rosiglitazone     unknown  . Tramadol Other (See Comments)    hallucinations    Chief Complaint  Patient presents with  . Discharge Note    Discharge to home on 05/14/17    HPI:  She is being discharged to home with home health for pt/ot/rn. She will need to complete her IV abt at home. She does not require any dme. She will need her prescriptions written and will need to follow up with her medical provider.  She was admitted to this facility from home for short term rehab related to discitis.   Past Medical History:  Diagnosis Date  . Anemia   . Asthma   . Chest pain, atypical   . Diabetes mellitus   . GERD (gastroesophageal reflux disease)   . History of transient ischemic attack (TIA)   . Hyperlipidemia   . Hypertension   . OA (osteoarthritis)   . Thyroid carcinoma [facility])     Past Surgical History:  Procedure Laterality Date  . BACK SURGERY    . BREAST BIOPSY    . CARDIAC CATHETERIZATION  03/18/2009   NORMAL LEFT VENTRICULAR SIZE AND CONTRACTILITY WITH NORMAL SYSTOLIC  FUNCTION. EF 60%  . CARDIOLITE STUDY     SHOWED A SIGNIFICANT REVERSIBLE ANTERIOR WALL DEFECT CONSISTENT WITH ISCHEMIA. EF 55%  . FEMUR IM NAIL Right 04/25/2015   Procedure: INTRAMEDULLARY (IM) RIGHT  RETROGRADE FEMORAL NAILING;  Surgeon: Rod Can, MD;  Location: [facility];  Service: Orthopedics;  Laterality: Right;  . IR FL GUIDED LOC OF NEEDLE/CATH TIP FOR SPINAL INJECTION RT  04/05/2017  . IR FLUORO GUIDE CV LINE RIGHT  04/08/2017  . IR US GUIDE VASC ACCESS RIGHT  04/08/2017  . LUMBAR FUSION    . THYROIDECTOMY    . TOTAL KNEE  ARTHROPLASTY     right    Social History   Socioeconomic History  . Marital status: Widowed    Spouse name: Not on file  . Number of children: 5  . Years of education: Not on file  . Highest education level: Not on file  Occupational History    Employer: RETIRED  Social Needs  . Financial resource strain: Not on file  . Food insecurity:    Worry: Not on file    Inability: Not on file  . Transportation needs:    Medical: Not on file    Non-medical: Not on file  Tobacco Use  . Smoking status: Former Research scientist (life sciences)  . Smokeless tobacco: Never Used  Substance and Sexual Activity  . Alcohol use: No  . Drug use: No  . Sexual activity: Not on file  Lifestyle  . Physical activity:    Days per week: Not on file    Minutes per session: Not on file  . Stress: Not on file  Relationships  . Social connections:    Talks on phone: Not on file    Gets together: Not on file    Attends religious service: Not on file    Active member of club or organization: Not on file  Attends meetings of clubs or organizations: Not on file    Relationship status: Not on file  . Intimate partner violence:    Fear of current or ex partner: Not on file    Emotionally abused: Not on file    Physically abused: Not on file    Forced sexual activity: Not on file  Other Topics Concern  . Not on file  Social History Narrative  . Not on file   Family History  Problem Relation Age of Onset  . Pneumonia Mother   . Heart attack Father     VITAL SIGNS BP 130/62   Pulse 70   Temp 97.9 F (36.6 C)   Resp 18   Ht _0  (1.651 m)   Wt 243 lb (110.2 kg)   SpO2 96%   BMI 40.44 kg/m   Patient's Medications  New Prescriptions   No medications on file  Previous Medications   CALCITRIOL (ROCALTROL) 0.25 MCG CAPSULE    Take 0.25 mcg by mouth 2 (two) times daily.    CALCIUM CARBONATE ANTACID (TUMS PO)    Take 2 tablets by mouth 2 (two) times daily as needed.   CALCIUM-VITAMIN D (OSCAL WITH D) 500-200  MG-UNIT PER TABLET    Take 1 tablet by mouth daily with breakfast.   DAPTOMYCIN (CUBICIN) IVPB    Inject 650 mg into the vein daily. Indication:  discitis Last Day of Therapy:  May 16, 2017 Labs - Once weekly:  CBC/D, BMP, and CPK Labs - Every other week:  ESR and CRP   DIPYRIDAMOLE-ASPIRIN (AGGRENOX) 25-200 MG PER 12 HR CAPSULE    Take 1 capsule by mouth 2 (two) times daily.    FLUTICASONE (FLONASE) 50 MCG/ACT NASAL SPRAY    Place 1 spray into the nose 2 (two) times daily as needed for allergies.    FUROSEMIDE (LASIX) 20 MG TABLET    Take 20 mg by mouth every other day.    HUMULIN N 100 UNIT/ML INJECTION    Inject 10 mg into the skin daily before breakfast.    HYDROCODONE-ACETAMINOPHEN (NORCO/VICODIN) 5-325 MG TABLET    Take 1 tablet by mouth every 4 (four) hours as needed for moderate pain.   LEVOFLOXACIN (LEVAQUIN) 750 MG TABLET    Take 1 tablet (750 mg total) by mouth every other day. Till 05/16/17   LEVOTHYROXINE (SYNTHROID, LEVOTHROID) 137 MCG TABLET    Take 137 mcg by mouth daily before breakfast.   LIDOCAINE (LIDODERM) 5 %    Apply to right flank topically in the morning and Apply to right knee topically one time daily. 2 Patches.  Remove & Discard patch within 12 hours or as directed by MD   LISINOPRIL (PRINIVIL,ZESTRIL) 40 MG TABLET    Take 40 mg by mouth daily.    MECLIZINE (ANTIVERT) 12.5 MG TABLET    Take 12.5 mg by mouth daily as needed for dizziness.   METHOCARBAMOL (ROBAXIN) 500 MG TABLET    Take 1 tablet (500 mg total) by mouth every 6 (six) hours as needed for muscle spasms.   MONTELUKAST (SINGULAIR) 10 MG TABLET    Take 10 mg by mouth daily as needed.   NYSTATIN OINTMENT (MYCOSTATIN)    Apply 1 application topically 2 (two) times daily as needed for itching.   OMEPRAZOLE (PRILOSEC) 20 MG CAPSULE    Take 20 mg by mouth at bedtime.   ONDANSETRON (ZOFRAN) 4 MG TABLET    Take 4 mg by mouth 2 (two) times daily  as needed for nausea or vomiting.   POLYETHYLENE GLYCOL (MIRALAX /  GLYCOLAX) PACKET    Take 17 g by mouth at bedtime.   PREGABALIN (LYRICA) 100 MG CAPSULE    Take 1 capsule (100 mg total) by mouth every 8 (eight) hours.   SENNA (SENOKOT) 8.6 MG TABS TABLET    Take 1 tablet by mouth 2 (two) times daily as needed for mild constipation.   SIMETHICONE (MYLICON) 80 MG CHEWABLE TABLET    Chew 80 mg by mouth every 8 (eight) hours as needed for flatulence.   SODIUM CHLORIDE 0.9 % INJECTION    Inject 10 mLs into the vein daily. Flush before antibiotic, administer antibiotic and follow with flust   TRIMETHOPRIM (TRIMPEX) 100 MG TABLET    Take 100 mg by mouth daily.   VALACYCLOVIR (VALTREX) 1000 MG TABLET    TAKE 1000 MG BY MOUTH DAILY AS NEEDED  Modified Medications   No medications on file  Discontinued Medications   SIMETHICONE (MYLICON) 80 MG CHEWABLE TABLET    Chew 80 mg by mouth every 8 (eight) hours as needed for flatulence.      SIGNIFICANT DIAGNOSTIC EXAMS  PREVIOUS;   04-01-17: MRI thoracic spine: 1. Endplate changes in fluid in the disc space at T10-11 is highly concerning for disc osteomyelitis. Follow-up MRI of the thoracic spine with contrast could be used to better define the extent of infection. 2. Slight retrolisthesis is present at T10-11 with mild central canal stenosis. 3. Moderate foraminal stenosis bilaterally at T10-11. 4. No other focal disc protrusion or stenosis.  NO NEW EXAMS   LABS REVIEWED: PREVIOUS   04-02-17: wbc 10.4; hgb 9.2; hct 29.0; mcv 97.2; plt  339; glucose 149; bun 27; creat 1.62; k+ 4.0p na++ 142; ca 9.1; liver normal albumin 3.9; blood culture: negative 04-05-17: wbc 9.8; hgb 9.3; hct 28.5; mcv 95.0; plt 286; glucose 135; bun 18; creat 1.27; k+ 3.7; na++ 141; ca 8.8 04-06-17: wbc 11.8; hgb 8.8; hct 27.2; mcv 96.8; plt 285; glucose 140; bun 25; creat 1.36; k+ 4.1; na++ 138; ca 8.5; vit B 12: 469; folate 19.5; iron 35; tibc 251; ferritin 60 04-07-17: hgb a1c 6.6 04-08-17: tsh 1.011; free T4: 1.04   NO NEW LABS      Review of  Systems  Constitutional: Negative for malaise/fatigue.  Respiratory: Negative for cough and shortness of breath.   Cardiovascular: Negative for chest pain, palpitations and leg swelling.  Gastrointestinal: Negative for abdominal pain, constipation and heartburn.  Musculoskeletal: Negative for back pain, joint pain and myalgias.  Skin: Negative.   Neurological: Negative for dizziness.  Psychiatric/Behavioral: The patient is not nervous/anxious.     Physical Exam  Constitutional: She is oriented to person, place, and time. She appears well-developed and well-nourished. No distress.  Neck: No thyromegaly present.  Cardiovascular: Normal rate, regular rhythm, normal heart sounds and intact distal pulses.  Pulmonary/Chest: Effort normal and breath sounds normal. No respiratory distress.  Abdominal: Soft. Bowel sounds are normal. She exhibits no distension. There is no tenderness.  Musculoskeletal: Normal range of motion. She exhibits no edema.  Lymphadenopathy:    She has no cervical adenopathy.  Neurological: She is alert and oriented to person, place, and time.  Skin: Skin is warm and dry. She is not diaphoretic.  Psychiatric: She has a normal mood and affect.       ASSESSMENT/ PLAN:  Patient is being discharged with the following home health services:  Pt/ot/rn: to evaluate and treat as indicated  for gait balance strength adl training and medication management.   Patient is being discharged with the following durable medical equipment:  None required   Patient has been advised to f/u with their PCP in 1-2 weeks to bring them up to date on their rehab stay.  Social services at facility was responsible for arranging this appointment.  Pt was provided with a 30 day supply of prescriptions for medications and refills must be obtained from their PCP.  For controlled substances, a more limited supply may be provided adequate until PCP appointment only.   A 30 day supply of her  prescription medications have been written as listed above #15 vicodin 5/325 mg tabs #90 lyrica 100 mg tabs   Time spent with patient: 35 minutes: discussed medications; home health needs and goals; and no dme needed. Verbalized understanding.   Ok Edwards NP [facility]  Contact [phone] Monday through Friday 8am- 5pm  After hours call [phone]

## 2017-05-12 NOTE — Telephone Encounter (Signed)
Discharged with patient 

## 2017-05-13 ENCOUNTER — Other Ambulatory Visit: Payer: Self-pay | Admitting: Pharmacist

## 2017-05-13 LAB — CBC AND DIFFERENTIAL
HEMATOCRIT: 25 — AB (ref 36–46)
Hemoglobin: 8.5 — AB (ref 12.0–16.0)
NEUTROS ABS: 6
PLATELETS: 247 (ref 150–399)
WBC: 9

## 2017-05-13 LAB — BASIC METABOLIC PANEL
BUN: 25 — AB (ref 4–21)
Creatinine: 1.7 — AB (ref 0.5–1.1)
Potassium: 4.2 (ref 3.4–5.3)
Sodium: 141 (ref 137–147)

## 2017-05-13 NOTE — Progress Notes (Signed)
OPAT lab monitoring 

## 2017-05-18 ENCOUNTER — Encounter: Payer: Self-pay | Admitting: Adult Health

## 2017-05-18 ENCOUNTER — Non-Acute Institutional Stay (SKILLED_NURSING_FACILITY): Payer: Medicare Other | Admitting: Adult Health

## 2017-05-18 DIAGNOSIS — E1143 Type 2 diabetes mellitus with diabetic autonomic (poly)neuropathy: Secondary | ICD-10-CM

## 2017-05-18 DIAGNOSIS — E1169 Type 2 diabetes mellitus with other specified complication: Secondary | ICD-10-CM | POA: Diagnosis not present

## 2017-05-18 DIAGNOSIS — I639 Cerebral infarction, unspecified: Secondary | ICD-10-CM

## 2017-05-18 DIAGNOSIS — E890 Postprocedural hypothyroidism: Secondary | ICD-10-CM

## 2017-05-18 DIAGNOSIS — E785 Hyperlipidemia, unspecified: Secondary | ICD-10-CM

## 2017-05-18 NOTE — Progress Notes (Signed)
Location:   [facility] Room Number: 126 A Place of Service:  SNF (31)   CODE STATUS: Full Code  Allergies  Allergen Reactions  . Pioglitazone Swelling  . Actos [Pioglitazone Hydrochloride] Swelling  . Ativan [Lorazepam]     swelling  . Cephalexin Other (See Comments)    Doesn't remember   . Oxycodone     This medication makes her feel sick  . Rosiglitazone     unknown  . Tramadol Other (See Comments)    hallucinations    Chief Complaint  Patient presents with  . Medical Management of Chronic Issues    Peripheral neuropathy; dyslipidemia; hypothyroidism; cva.    HPI:  She is a 77 year old short term rehab patient being seen for the management of her chronic illnesses: peripheral neuropathy; dyslipidemia; hypothyroidism; cva. She was due to be discharged; however; this is still pending. She denies any uncontrolled pain; no insomnia; no anxiety. There are no nursing concerns at this time. She did tell me that she will be going to assisted living in the near future.   Past Medical History:  Diagnosis Date  . Anemia   . Asthma   . Chest pain, atypical   . Diabetes mellitus   . GERD (gastroesophageal reflux disease)   . History of transient ischemic attack (TIA)   . Hyperlipidemia   . Hypertension   . OA (osteoarthritis)   . Thyroid carcinoma [facility])     Past Surgical History:  Procedure Laterality Date  . BACK SURGERY    . BREAST BIOPSY    . CARDIAC CATHETERIZATION  03/18/2009   NORMAL LEFT VENTRICULAR SIZE AND CONTRACTILITY WITH NORMAL SYSTOLIC  FUNCTION. EF 60%  . CARDIOLITE STUDY     SHOWED A SIGNIFICANT REVERSIBLE ANTERIOR WALL DEFECT CONSISTENT WITH ISCHEMIA. EF 55%  . FEMUR IM NAIL Right 04/25/2015   Procedure: INTRAMEDULLARY (IM) RIGHT  RETROGRADE FEMORAL NAILING;  Surgeon: Rod Can, MD;  Location: [facility]S;  Service: Orthopedics;  Laterality: Right;  . IR FL GUIDED LOC OF NEEDLE/CATH TIP FOR SPINAL INJECTION RT  04/05/2017  . IR FLUORO  GUIDE CV LINE RIGHT  04/08/2017  . IR US GUIDE VASC ACCESS RIGHT  04/08/2017  . LUMBAR FUSION    . THYROIDECTOMY    . TOTAL KNEE ARTHROPLASTY     right    Social History   Socioeconomic History  . Marital status: Widowed    Spouse name: Not on file  . Number of children: 5  . Years of education: Not on file  . Highest education level: Not on file  Occupational History    Employer: RETIRED  Social Needs  . Financial resource strain: Not on file  . Food insecurity:    Worry: Not on file    Inability: Not on file  . Transportation needs:    Medical: Not on file    Non-medical: Not on file  Tobacco Use  . Smoking status: Former Research scientist (life sciences)  . Smokeless tobacco: Never Used  Substance and Sexual Activity  . Alcohol use: No  . Drug use: No  . Sexual activity: Not on file  Lifestyle  . Physical activity:    Days per week: Not on file    Minutes per session: Not on file  . Stress: Not on file  Relationships  . Social connections:    Talks on phone: Not on file    Gets together: Not on file    Attends religious service: Not on file  Active member of club or organization: Not on file    Attends meetings of clubs or organizations: Not on file    Relationship status: Not on file  . Intimate partner violence:    Fear of current or ex partner: Not on file    Emotionally abused: Not on file    Physically abused: Not on file    Forced sexual activity: Not on file  Other Topics Concern  . Not on file  Social History Narrative  . Not on file   Family History  Problem Relation Age of Onset  . Pneumonia Mother   . Heart attack Father       VITAL SIGNS BP 138/78   Pulse 74   Temp (!) 97.4 F (36.3 C)   Resp 18   Ht 5\' 5"  (1.651 m)   Wt 243 lb 12.8 oz (110.6 kg)   SpO2 96%   BMI 40.57 kg/m   Outpatient Encounter Medications as of 05/18/2017  Medication Sig  . calcitRIOL (ROCALTROL) 0.25 MCG capsule Take 0.25 mcg by mouth 2 (two) times daily.   . Calcium Carbonate  Antacid (TUMS PO) Take 2 tablets by mouth 2 (two) times daily as needed.  . calcium-vitamin D (OSCAL WITH D) 500-200 MG-UNIT per tablet Take 1 tablet by mouth daily with breakfast.  . daptomycin (CUBICIN) IVPB Inject 650 mg into the vein daily. Date of last dose is 05/26/17  . dipyridamole-aspirin (AGGRENOX) 25-200 MG per 12 hr capsule Take 1 capsule by mouth 2 (two) times daily.   . fluticasone (FLONASE) 50 MCG/ACT nasal spray Place 1 spray into the nose 2 (two) times daily as needed for allergies.   . furosemide (LASIX) 20 MG tablet Take 20 mg by mouth every other day.   Marland Kitchen HUMULIN N 100 UNIT/ML injection Inject 10 mg into the skin daily before breakfast.   . HYDROcodone-acetaminophen (NORCO/VICODIN) 5-325 MG tablet Take 1 tablet by mouth every 4 (four) hours as needed for moderate pain.  Marland Kitchen levothyroxine (SYNTHROID, LEVOTHROID) 137 MCG tablet Take 137 mcg by mouth daily before breakfast.  . lidocaine (LIDODERM) 5 % Apply to right flank topically in the morning and Apply to right knee topically one time daily. 2 Patches.  Remove & Discard patch within 12 hours or as directed by MD  . lisinopril (PRINIVIL,ZESTRIL) 40 MG tablet Take 40 mg by mouth daily.   . meclizine (ANTIVERT) 12.5 MG tablet Take 12.5 mg by mouth daily as needed for dizziness.  . methocarbamol (ROBAXIN) 500 MG tablet Take 1 tablet (500 mg total) by mouth every 6 (six) hours as needed for muscle spasms.  . montelukast (SINGULAIR) 10 MG tablet Take 10 mg by mouth daily as needed.  . nystatin ointment (MYCOSTATIN) Apply 1 application topically 2 (two) times daily as needed for itching.  Marland Kitchen omeprazole (PRILOSEC) 20 MG capsule Take 20 mg by mouth at bedtime.  . ondansetron (ZOFRAN) 4 MG tablet Take 4 mg by mouth 2 (two) times daily as needed for nausea or vomiting.  . polyethylene glycol (MIRALAX / GLYCOLAX) packet Take 17 g by mouth at bedtime.  . pregabalin (LYRICA) 100 MG capsule Take 1 capsule (100 mg total) by mouth every 8 (eight)  hours.  . senna (SENOKOT) 8.6 MG TABS tablet Take 1 tablet by mouth 2 (two) times daily as needed for mild constipation.  . simethicone (MYLICON) 80 MG chewable tablet Chew 80 mg by mouth every 8 (eight) hours as needed for flatulence.  . sodium chloride  0.9 % injection Inject 10 mLs into the vein daily. Flush before antibiotic, administer antibiotic and follow with flust  . trimethoprim (TRIMPEX) 100 MG tablet Take 100 mg by mouth daily.  . valACYclovir (VALTREX) 1000 MG tablet TAKE 1000 MG BY MOUTH DAILY AS NEEDED  . [DISCONTINUED] escitalopram (LEXAPRO) 10 MG tablet Take 10 mg by mouth daily.    . [DISCONTINUED] levofloxacin (LEVAQUIN) 750 MG tablet Take 1 tablet (750 mg total) by mouth every other day. Till 05/16/17 (Patient not taking: Reported on 05/18/2017)   No facility-administered encounter medications on file as of 05/18/2017.      SIGNIFICANT DIAGNOSTIC EXAMS   PREVIOUS;   04-01-17: MRI thoracic spine: 1. Endplate changes in fluid in the disc space at T10-11 is highly concerning for disc osteomyelitis. Follow-up MRI of the thoracic spine with contrast could be used to better define the extent of infection. 2. Slight retrolisthesis is present at T10-11 with mild central canal stenosis. 3. Moderate foraminal stenosis bilaterally at T10-11. 4. No other focal disc protrusion or stenosis.  NO NEW EXAMS   LABS REVIEWED: PREVIOUS   04-02-17: wbc 10.4; hgb 9.2; hct 29.0; mcv 97.2; plt  339; glucose 149; bun 27; creat 1.62; k+ 4.0p na++ 142; ca 9.1; liver normal albumin 3.9; blood culture: negative 04-05-17: wbc 9.8; hgb 9.3; hct 28.5; mcv 95.0; plt 286; glucose 135; bun 18; creat 1.27; k+ 3.7; na++ 141; ca 8.8 04-06-17: wbc 11.8; hgb 8.8; hct 27.2; mcv 96.8; plt 285; glucose 140; bun 25; creat 1.36; k+ 4.1; na++ 138; ca 8.5; vit B 12: 469; folate 19.5; iron 35; tibc 251; ferritin 60 04-07-17: hgb a1c 6.6 04-08-17: tsh 1.011; free T4: 1.04   NO NEW LABS      Review of Systems    Constitutional: Negative for malaise/fatigue.  Respiratory: Negative for cough and shortness of breath.   Cardiovascular: Negative for chest pain, palpitations and leg swelling.  Gastrointestinal: Negative for abdominal pain, constipation and heartburn.  Musculoskeletal: Negative for back pain, joint pain and myalgias.  Skin: Negative.   Neurological: Negative for dizziness.  Psychiatric/Behavioral: The patient is not nervous/anxious.    Physical Exam  Constitutional: She is oriented to person, place, and time. She appears well-developed and well-nourished. No distress.  Neck: No thyromegaly present.  Cardiovascular: Normal rate, regular rhythm, normal heart sounds and intact distal pulses.  Pulmonary/Chest: Effort normal and breath sounds normal. No respiratory distress.  Abdominal: Soft. Bowel sounds are normal. She exhibits no distension. There is no tenderness.  Musculoskeletal: Normal range of motion. She exhibits no edema.  Lymphadenopathy:    She has no cervical adenopathy.  Neurological: She is alert and oriented to person, place, and time.  Skin: Skin is warm and dry. She is not diaphoretic.  Psychiatric: She has a normal mood and affect.     ASSESSMENT/ PLAN:  TODAY;   1. Dyslipidemia associated with type 2 diabetes mellitus: stable will continue lipitor 20 mg daily   2. Post operative hypothyroidism: status post thyroidectomy due thyroid cancer: is stable tsh 1.011 with free T4: 1.04; will continue synthroid 137 mcg daily   3. 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  Peripheral autonomic neuropathy due to diabetes mellitus type 2: is stable will continue lyrica 100 mg every 8 hours   PREVIOUS  5.  Recurrent UTI: without recent infections: will continue trimpex 100 mg daily for suppression therapy.  6. Slow transit constipation; stable will continue miralax and senna to as needed  basis  7. Bilateral lower extremity edema: stable will continue lasix 20 mg every other day   8. Allergic rhinitis: stable will continue flonase daily as needed and singulair 10 mg daily as needed  9.  Chronic back pain; status post lumbar spinal fusion: stable will continue vicodin 5/325 mg every 6 hours as needed and robaxin 500 mg every 6 hours as needed   10. Hypertensive renal disease with renal failure stage 1-4: stable bun 25; creat 1.36; will continue calcitriol 0.25 mcg twice daily   11. GERD without esophagitis: stable will continue  prilosec 20 mg daily   12. Benign essential hypertension/hypertension associated with diabetes: stable b/p 138/78: will continue lisinopril 40 mg daily   13. Insulin dependent type 2 diabetes mellitus, controlled: stable hgb a1c 6.6: will continue humulin N 10 units in the AM is on asa ace and statin    MD is aware of resident's narcotic use and is in agreement with current plan of care. We will attempt to wean resident as apropriate   Ok Edwards NP [facility]  Contact [phone] Monday through Friday 8am- 5pm  After hours call [phone]

## 2017-05-19 ENCOUNTER — Encounter: Payer: Self-pay | Admitting: Adult Health

## 2017-05-19 ENCOUNTER — Encounter: Payer: Self-pay | Admitting: Internal Medicine

## 2017-05-19 ENCOUNTER — Ambulatory Visit (INDEPENDENT_AMBULATORY_CARE_PROVIDER_SITE_OTHER): Payer: Medicare Other | Admitting: Internal Medicine

## 2017-05-19 VITALS — BP 124/68 | HR 69 | Temp 98.3°F | Ht 64.0 in | Wt 250.0 lb

## 2017-05-19 DIAGNOSIS — N183 Chronic kidney disease, stage 3 unspecified: Secondary | ICD-10-CM

## 2017-05-19 DIAGNOSIS — Z5181 Encounter for therapeutic drug level monitoring: Secondary | ICD-10-CM | POA: Diagnosis not present

## 2017-05-19 DIAGNOSIS — M4624 Osteomyelitis of vertebra, thoracic region: Secondary | ICD-10-CM | POA: Diagnosis not present

## 2017-05-19 DIAGNOSIS — M4644 Discitis, unspecified, thoracic region: Secondary | ICD-10-CM | POA: Diagnosis not present

## 2017-05-19 NOTE — Progress Notes (Signed)
Location:   [facility] Room Number: 126 A Place of Service:  SNF (31)   CODE STATUS: Full Code  Allergies  Allergen Reactions  . Pioglitazone Swelling  . Actos [Pioglitazone Hydrochloride] Swelling  . Ativan [Lorazepam]     swelling  . Cephalexin Other (See Comments)    Doesn't remember   . Oxycodone     This medication makes her feel sick  . Rosiglitazone     unknown  . Tramadol Other (See Comments)    hallucinations    Chief Complaint  Patient presents with  . Acute Visit    DM    HPI:    Past Medical History:  Diagnosis Date  . Anemia   . Asthma   . Chest pain, atypical   . Diabetes mellitus   . GERD (gastroesophageal reflux disease)   . History of transient ischemic attack (TIA)   . Hyperlipidemia   . Hypertension   . OA (osteoarthritis)   . Thyroid carcinoma [facility])     Past Surgical History:  Procedure Laterality Date  . BACK SURGERY    . BREAST BIOPSY    . CARDIAC CATHETERIZATION  03/18/2009   NORMAL LEFT VENTRICULAR SIZE AND CONTRACTILITY WITH NORMAL SYSTOLIC  FUNCTION. EF 60%  . CARDIOLITE STUDY     SHOWED A SIGNIFICANT REVERSIBLE ANTERIOR WALL DEFECT CONSISTENT WITH ISCHEMIA. EF 55%  . FEMUR IM NAIL Right 04/25/2015   Procedure: INTRAMEDULLARY (IM) RIGHT  RETROGRADE FEMORAL NAILING;  Surgeon: Rod Can, MD;  Location: [facility];  Service: Orthopedics;  Laterality: Right;  . IR FL GUIDED LOC OF NEEDLE/CATH TIP FOR SPINAL INJECTION RT  04/05/2017  . IR FLUORO GUIDE CV LINE RIGHT  04/08/2017  . IR US GUIDE VASC ACCESS RIGHT  04/08/2017  . LUMBAR FUSION    . THYROIDECTOMY    . TOTAL KNEE ARTHROPLASTY     right    Social History   Socioeconomic History  . Marital status: Widowed    Spouse name: Not on file  . Number of children: 5  . Years of education: Not on file  . Highest education level: Not on file  Occupational History    Employer: RETIRED  Social Needs  . Financial resource strain: Not on file  . Food insecurity:    Worry: Not on file    Inability: Not on file  . Transportation needs:    Medical: Not on file    Non-medical: Not on file  Tobacco Use  . Smoking status: Former Research scientist (life sciences)  . Smokeless tobacco: Never Used  Substance and Sexual Activity  . Alcohol use: No  . Drug use: No  . Sexual activity: Not on file  Lifestyle  . Physical activity:    Days per week: Not on file    Minutes per session: Not on file  . Stress: Not on file  Relationships  . Social connections:    Talks on phone: Not on file    Gets together: Not on file    Attends religious service: Not on file    Active member of club or organization: Not on file    Attends meetings of clubs or organizations: Not on file    Relationship status: Not on file  . Intimate partner violence:    Fear of current or ex partner: Not on file    Emotionally abused: Not on file    Physically abused: Not on file    Forced sexual activity: Not on file  Other Topics  Concern  . Not on file  Social History Narrative  . Not on file   Family History  Problem Relation Age of Onset  . Pneumonia Mother   . Heart attack Father       VITAL SIGNS BP 119/62   Pulse 72   Temp (!) 97.2 F (36.2 C)   Resp 20   Ht 5\' 5"  (1.651 m)   Wt 248 lb 9.6 oz (112.8 kg)   SpO2 98%   BMI 41.37 kg/m   Outpatient Encounter Medications as of 05/19/2017  Medication Sig  . calcitRIOL (ROCALTROL) 0.25 MCG capsule Take 0.25 mcg by mouth 2 (two) times daily.   . Calcium Carbonate Antacid (TUMS PO) Take 2 tablets by mouth 2 (two) times daily as needed.  . calcium-vitamin D (OSCAL WITH D) 500-200 MG-UNIT per tablet Take 1 tablet by mouth daily with breakfast.  . daptomycin (CUBICIN) IVPB Inject 650 mg into the vein daily. Date of last dose is 05/26/17  . dipyridamole-aspirin (AGGRENOX) 25-200 MG per 12 hr capsule Take 1 capsule by mouth 2 (two) times daily.   . fluticasone (FLONASE) 50 MCG/ACT nasal spray Place 1 spray into the nose 2 (two) times daily as needed  for allergies.   . furosemide (LASIX) 20 MG tablet Take 20 mg by mouth every other day.   Marland Kitchen HUMULIN N 100 UNIT/ML injection Inject 10 mg into the skin daily before breakfast.   . HYDROcodone-acetaminophen (NORCO/VICODIN) 5-325 MG tablet Take 1 tablet by mouth every 4 (four) hours as needed for moderate pain.  Marland Kitchen levothyroxine (SYNTHROID, LEVOTHROID) 137 MCG tablet Take 137 mcg by mouth daily before breakfast.  . lidocaine (LIDODERM) 5 % Apply to right flank topically in the morning and Apply to right knee topically one time daily. 2 Patches.  Remove & Discard patch within 12 hours or as directed by MD  . lisinopril (PRINIVIL,ZESTRIL) 40 MG tablet Take 40 mg by mouth daily.   . meclizine (ANTIVERT) 12.5 MG tablet Take 12.5 mg by mouth daily as needed for dizziness.  . methocarbamol (ROBAXIN) 500 MG tablet Take 1 tablet (500 mg total) by mouth every 6 (six) hours as needed for muscle spasms.  . montelukast (SINGULAIR) 10 MG tablet Take 10 mg by mouth daily as needed.  . nystatin ointment (MYCOSTATIN) Apply 1 application topically 2 (two) times daily as needed for itching.  Marland Kitchen omeprazole (PRILOSEC) 20 MG capsule Take 20 mg by mouth at bedtime.  . ondansetron (ZOFRAN) 4 MG tablet Take 4 mg by mouth 2 (two) times daily as needed for nausea or vomiting.  . polyethylene glycol (MIRALAX / GLYCOLAX) packet Take 17 g by mouth at bedtime.  . pregabalin (LYRICA) 100 MG capsule Take 1 capsule (100 mg total) by mouth every 8 (eight) hours.  . senna (SENOKOT) 8.6 MG TABS tablet Take 1 tablet by mouth 2 (two) times daily as needed for mild constipation.  . simethicone (MYLICON) 80 MG chewable tablet Chew 80 mg by mouth every 8 (eight) hours as needed for flatulence.  . sodium chloride 0.9 % injection Inject 10 mLs into the vein daily. Flush before antibiotic, administer antibiotic and follow with flust  . trimethoprim (TRIMPEX) 100 MG tablet Take 100 mg by mouth daily.  . valACYclovir (VALTREX) 1000 MG tablet TAKE  1000 MG BY MOUTH DAILY AS NEEDED  . [DISCONTINUED] escitalopram (LEXAPRO) 10 MG tablet Take 10 mg by mouth daily.     No facility-administered encounter medications on file as of 05/19/2017.  SIGNIFICANT DIAGNOSTIC EXAMS       ASSESSMENT/ PLAN:    MD is aware of resident's narcotic use and is in agreement with current plan of care. We will attempt to wean resident as apropriate   Ok Edwards NP [facility]  Contact [phone] Monday through Friday 8am- 5pm  After hours call [phone]

## 2017-05-20 ENCOUNTER — Encounter: Payer: Self-pay | Admitting: Internal Medicine

## 2017-05-20 LAB — COMPREHENSIVE METABOLIC PANEL
AG Ratio: 1.5 (calc) (ref 1.0–2.5)
ALBUMIN MSPROF: 3.7 g/dL (ref 3.6–5.1)
ALT: 8 U/L (ref 6–29)
AST: 14 U/L (ref 10–35)
Alkaline phosphatase (APISO): 45 U/L (ref 33–130)
BUN/Creatinine Ratio: 16 (calc) (ref 6–22)
BUN: 28 mg/dL — AB (ref 7–25)
CO2: 30 mmol/L (ref 20–32)
CREATININE: 1.76 mg/dL — AB (ref 0.60–0.93)
Calcium: 9 mg/dL (ref 8.6–10.4)
Chloride: 103 mmol/L (ref 98–110)
GLUCOSE: 141 mg/dL — AB (ref 65–99)
Globulin: 2.5 g/dL (calc) (ref 1.9–3.7)
POTASSIUM: 4.3 mmol/L (ref 3.5–5.3)
SODIUM: 140 mmol/L (ref 135–146)
TOTAL PROTEIN: 6.2 g/dL (ref 6.1–8.1)
Total Bilirubin: 0.3 mg/dL (ref 0.2–1.2)

## 2017-05-20 LAB — CK: CK TOTAL: 163 U/L — AB (ref 29–143)

## 2017-05-20 LAB — C-REACTIVE PROTEIN: CRP: 6.7 mg/L (ref <8.0)

## 2017-05-20 LAB — SEDIMENTATION RATE: Sed Rate: 34 mm/h — ABNORMAL HIGH (ref 0–30)

## 2017-05-20 NOTE — Assessment & Plan Note (Addendum)
No concerning signs on exam.  Will recheck inflammatory markers and if reassuring, stop treatment as planned and return PRN

## 2017-05-20 NOTE — Assessment & Plan Note (Signed)
Will recheck creat today

## 2017-05-20 NOTE — Assessment & Plan Note (Signed)
Will check LFTs and CK on medication

## 2017-05-20 NOTE — Progress Notes (Signed)
   Subjective:    Patient ID: [name], female    DOB: [birth year], 77 y.o.   MRN: [id number]  HPI Here for follow up of discitis.   She has a history of lumbar surgeries and developed more acute new back pain and MRI c/w acute T10-11 discitis with osteomyelitis.  She underwent aspiration but culture remained negative.  I put her on daptomycin and oral levaquin for a projected 6 weeks through May 13.  No associated rash, diarrhea. Pain better controlled of her flank.   Review of Systems  Constitutional: Negative for fatigue.  Gastrointestinal: Negative for diarrhea.  Skin: Negative for rash.       Objective:   Physical Exam  Constitutional: She appears well-developed and well-nourished.  Eyes: No scleral icterus.  Cardiovascular: Normal rate, regular rhythm and normal heart sounds.  No murmur heard. Pulmonary/Chest: Effort normal and breath sounds normal.  Musculoskeletal:  No thoracic tenderness, no warmth  Lymphadenopathy:    She has no cervical adenopathy.  Skin: No rash noted.   SH: staying at a SNF       Assessment & Plan:

## 2017-05-25 ENCOUNTER — Other Ambulatory Visit: Payer: Self-pay

## 2017-05-25 MED ORDER — HYDROCODONE-ACETAMINOPHEN 5-325 MG PO TABS: 1.0000 | ORAL_TABLET | ORAL | 0 refills | Status: DC | PRN

## 2017-05-25 NOTE — Telephone Encounter (Signed)
Discharged with patient 

## 2017-05-28 NOTE — Progress Notes (Signed)
This encounter was created in error - please disregard.

## 2017-06-20 ENCOUNTER — Other Ambulatory Visit: Payer: Self-pay | Admitting: Adult Health

## 2017-06-20 ENCOUNTER — Other Ambulatory Visit: Payer: Self-pay | Admitting: Family Medicine

## 2017-07-17 ENCOUNTER — Encounter: Payer: Self-pay | Admitting: Internal Medicine

## 2017-08-12 DIAGNOSIS — M7051 Other bursitis of knee, right knee: Secondary | ICD-10-CM | POA: Insufficient documentation

## 2017-09-08 ENCOUNTER — Other Ambulatory Visit: Payer: Self-pay | Admitting: Adult Health

## 2017-09-17 ENCOUNTER — Other Ambulatory Visit: Payer: Self-pay | Admitting: Adult Health

## 2017-09-20 ENCOUNTER — Ambulatory Visit: Payer: Medicare Other | Admitting: Podiatry

## 2017-09-20 ENCOUNTER — Encounter: Payer: Self-pay | Admitting: Podiatry

## 2017-09-20 DIAGNOSIS — E1142 Type 2 diabetes mellitus with diabetic polyneuropathy: Secondary | ICD-10-CM | POA: Diagnosis not present

## 2017-09-20 DIAGNOSIS — M2010 Hallux valgus (acquired), unspecified foot: Secondary | ICD-10-CM

## 2017-09-20 DIAGNOSIS — M79676 Pain in unspecified toe(s): Secondary | ICD-10-CM | POA: Diagnosis not present

## 2017-09-20 DIAGNOSIS — B351 Tinea unguium: Secondary | ICD-10-CM | POA: Diagnosis not present

## 2017-09-20 MED ORDER — DICLOFENAC SODIUM 1 % TD GEL: 4.0000 g | Freq: Four times a day (QID) | TRANSDERMAL | 2 refills | Status: DC

## 2017-09-21 NOTE — Progress Notes (Signed)
She presents today after having seen Dr. Amalia Hailey for many years complaining of painful elongated toenails.  Objective: Vital signs are stable she is alert and oriented x3.  Pulses are palpable.  Neurologic sensorium is diminished per Semmes Weinstein monofilament.  Toenails are long thick yellow dystrophic clinically mycotic and on.  They are painful palpation as well as debridement.  She has reactive hyper keratoma to the dorsal aspect of the hallux at the tip of the toe.  No open lesions or wounds are noted.  Assessment: Better peripheral neuropathy with pain in limb secondary onychomycosis.  Plan: Debrided toenails 1 through 5 bilaterally.  Placed padding on the hallux.  And had her measured today for diabetic shoes.

## 2017-10-26 ENCOUNTER — Ambulatory Visit: Payer: Medicare Other | Admitting: Orthotics

## 2017-10-26 DIAGNOSIS — M2012 Hallux valgus (acquired), left foot: Secondary | ICD-10-CM

## 2017-10-26 DIAGNOSIS — E1142 Type 2 diabetes mellitus with diabetic polyneuropathy: Secondary | ICD-10-CM

## 2017-10-26 DIAGNOSIS — M2042 Other hammer toe(s) (acquired), left foot: Secondary | ICD-10-CM

## 2017-10-26 DIAGNOSIS — M2010 Hallux valgus (acquired), unspecified foot: Secondary | ICD-10-CM

## 2017-10-26 DIAGNOSIS — M2011 Hallux valgus (acquired), right foot: Secondary | ICD-10-CM

## 2017-11-04 DIAGNOSIS — M5414 Radiculopathy, thoracic region: Secondary | ICD-10-CM | POA: Insufficient documentation

## 2017-11-04 DIAGNOSIS — G894 Chronic pain syndrome: Secondary | ICD-10-CM | POA: Insufficient documentation

## 2017-11-11 NOTE — Progress Notes (Signed)
Patient had appointment today for definitive and final diabetic shoe fitting and delivery.  Patient was seen by Betha, C.Ped, [facility].   The inserts fit well and accomplished full contact with the plantar surface of the foot bilateral; the shoes fit well and offered forefoot freedom, no noticible heel slippage, and good toe clearance w/ the insert in place.  Patient was advised to monitor of any skin irritation, breakdown.  Patient was satisfied with fit and function. 

## 2017-12-20 ENCOUNTER — Ambulatory Visit: Payer: Medicare Other | Admitting: Podiatry

## 2017-12-26 ENCOUNTER — Other Ambulatory Visit: Payer: Self-pay | Admitting: Family Medicine

## 2017-12-26 DIAGNOSIS — M810 Age-related osteoporosis without current pathological fracture: Secondary | ICD-10-CM

## 2018-01-02 ENCOUNTER — Other Ambulatory Visit: Payer: Self-pay | Admitting: Family Medicine

## 2018-01-02 DIAGNOSIS — Z1231 Encounter for screening mammogram for malignant neoplasm of breast: Secondary | ICD-10-CM

## 2018-01-09 ENCOUNTER — Ambulatory Visit: Payer: Medicare Other | Admitting: Podiatry

## 2018-01-18 ENCOUNTER — Other Ambulatory Visit: Payer: Self-pay | Admitting: Gastroenterology

## 2018-02-03 ENCOUNTER — Other Ambulatory Visit: Payer: Self-pay | Admitting: Adult Health

## 2018-02-14 ENCOUNTER — Other Ambulatory Visit: Payer: Self-pay | Admitting: Adult Health

## 2018-02-27 ENCOUNTER — Ambulatory Visit: Payer: Medicare Other

## 2018-02-27 ENCOUNTER — Other Ambulatory Visit: Payer: Medicare Other

## 2018-02-28 ENCOUNTER — Other Ambulatory Visit: Payer: Self-pay | Admitting: Endocrinology

## 2018-02-28 DIAGNOSIS — C73 Malignant neoplasm of thyroid gland: Secondary | ICD-10-CM

## 2018-03-07 ENCOUNTER — Ambulatory Visit
Admission: RE | Admit: 2018-03-07 | Discharge: 2018-03-07 | Disposition: A | Discharge status: Home / Self Care | Payer: Medicare Other | Source: Ambulatory Visit | Attending: Endocrinology | Admitting: Endocrinology

## 2018-03-07 ENCOUNTER — Ambulatory Visit (INDEPENDENT_AMBULATORY_CARE_PROVIDER_SITE_OTHER): Payer: Medicare Other | Admitting: Podiatry

## 2018-03-07 DIAGNOSIS — C73 Malignant neoplasm of thyroid gland: Secondary | ICD-10-CM

## 2018-03-07 DIAGNOSIS — E1142 Type 2 diabetes mellitus with diabetic polyneuropathy: Secondary | ICD-10-CM

## 2018-03-07 DIAGNOSIS — M79675 Pain in left toe(s): Secondary | ICD-10-CM | POA: Diagnosis not present

## 2018-03-07 DIAGNOSIS — B351 Tinea unguium: Secondary | ICD-10-CM

## 2018-03-07 DIAGNOSIS — M79674 Pain in right toe(s): Secondary | ICD-10-CM

## 2018-03-07 NOTE — Patient Instructions (Signed)

## 2018-03-13 ENCOUNTER — Encounter: Payer: Self-pay | Admitting: Podiatry

## 2018-03-13 NOTE — Progress Notes (Signed)
Subjective: [name] presents today with history of neuropathy with cc of painful, mycotic toenails.  Pain is aggravated when wearing enclosed shoe gear and relieved with periodic professional debridement.  Patient has peripheral neuropathy managed with Lyrica.  [name], [name], MD is her PCP.  Pt states she has tenderness b/l great toes. States diabetic shoes hurt her great toes. She has h/o permanent total nail avulsions.   Current Outpatient Medications:  .  atorvastatin (LIPITOR) 20 MG tablet, Take 20 mg by mouth daily. , Disp: , Rfl: 0 .  calcitRIOL (ROCALTROL) 0.25 MCG capsule, Take 0.25 mcg by mouth 2 (two) times daily. , Disp: , Rfl:  .  calcium-vitamin D (OSCAL WITH D) 500-200 MG-UNIT per tablet, Take 1 tablet by mouth daily with breakfast., Disp: , Rfl:  .  diclofenac sodium (VOLTAREN) 1 % GEL, Apply 4 g topically 4 (four) times daily. (Patient taking differently: Apply 4 g topically 4 (four) times daily as needed (pain). ), Disp: 100 g, Rfl: 2 .  dipyridamole-aspirin (AGGRENOX) 25-200 MG per 12 hr capsule, Take 1 capsule by mouth 2 (two) times daily. , Disp: , Rfl:  .  famotidine (PEPCID) 20 MG tablet, Take 20 mg by mouth daily as needed for heartburn or indigestion., Disp: , Rfl:  .  ferrous sulfate 325 (65 FE) MG tablet, Take 325 mg by mouth daily with breakfast., Disp: , Rfl:  .  furosemide (LASIX) 20 MG tablet, Take 20 mg by mouth every other day. , Disp: , Rfl: 0 .  HUMULIN N 100 UNIT/ML injection, Inject 10 mg into the skin daily before breakfast. , Disp: , Rfl: 6 .  HYDROcodone-acetaminophen (NORCO/VICODIN) 5-325 MG tablet, Take 1 tablet by mouth every 4 (four) hours as needed for moderate pain. (Patient taking differently: Take 0.5-1 tablets by mouth 2 (two) times daily as needed for moderate pain. ), Disp: 10 tablet, Rfl: 0 .  levothyroxine (SYNTHROID, LEVOTHROID) 125 MCG tablet, Take 125 mcg by mouth daily before breakfast., Disp: , Rfl:  .  lisinopril  (PRINIVIL,ZESTRIL) 40 MG tablet, Take 40 mg by mouth daily. , Disp: , Rfl:  .  meclizine (ANTIVERT) 12.5 MG tablet, Take 12.5 mg by mouth daily as needed for dizziness., Disp: , Rfl:  .  montelukast (SINGULAIR) 10 MG tablet, Take 10 mg by mouth daily as needed., Disp: , Rfl: 5 .  nystatin ointment (MYCOSTATIN), Apply 1 application topically 2 (two) times daily as needed for itching., Disp: , Rfl: 0 .  polyethylene glycol (MIRALAX / GLYCOLAX) packet, Take 17 g by mouth daily as needed for moderate constipation. , Disp: , Rfl:  .  pregabalin (LYRICA) 100 MG capsule, Take 1 capsule (100 mg total) by mouth every 8 (eight) hours., Disp: 90 capsule, Rfl: 0 .  senna (SENOKOT) 8.6 MG TABS tablet, Take 1 tablet by mouth 2 (two) times daily as needed for mild constipation., Disp: , Rfl:  .  trimethoprim (TRIMPEX) 100 MG tablet, Take 100 mg by mouth daily., Disp: , Rfl:   Allergies  Allergen Reactions  . Pioglitazone Swelling  . Actos [Pioglitazone Hydrochloride] Swelling  . Ativan [Lorazepam]     swelling  . Cephalexin Other (See Comments)    Doesn't remember   . Oxycodone     This medication makes her feel sick  . Rosiglitazone     unknown  . Tramadol Other (See Comments)    hallucinations    Objective:  Vascular Examination: Capillary refill time immediate x 10 digits.  Dorsalis  pedis and Posterior tibial pulses palpable b/l.  Digital hair x 10 digits was absent.  Skin temperature gradient WNL b/l.  Dermatological Examination: Skin with normal turgor, texture and tone b/l.  Toenails 2-5 b/l discolored, thick, dystrophic with subungual debris and pain with palpation to nailbeds due to thickness of nails.  Evidence of permanent total nail avulsions b/l hallux with dorsal hyperkeratosis. No erythema, no edema, no drainage.  Musculoskeletal: Muscle strength 5/5 to all muscle groups b/l  Neurological: Sensation with 10 gram monofilament is absent b/l  Assessment: 1. Painful  onychomycosis toenails 2-5 b/l 2. NIDDM with neuropathy  Plan: 1. Toenails 2-5 b/l were debrided in length and girth without iatrogenic bleeding. Dorsal hyperkeratosis b/l hallux nailbed gently filed with burr. Dispensed silicone toe caps for both great toes. Patient noted relief and appeared satisfied with treatment on today. 2. Patient to continue soft, supportive shoe gear daily. 3. Patient to report any pedal injuries to medical professional immediately. 4. Follow up 3 months.  5. Patient/POA to call should there be a concern in the interim.

## 2018-03-14 ENCOUNTER — Encounter (HOSPITAL_COMMUNITY)
Admission: RE | Disposition: A | Discharge status: Home / Self Care | Payer: Self-pay | Source: Ambulatory Visit | Attending: Gastroenterology

## 2018-03-14 ENCOUNTER — Ambulatory Visit (HOSPITAL_COMMUNITY): Payer: Medicare Other | Admitting: Certified Registered Nurse Anesthetist

## 2018-03-14 ENCOUNTER — Other Ambulatory Visit: Payer: Self-pay

## 2018-03-14 ENCOUNTER — Ambulatory Visit (HOSPITAL_COMMUNITY)
Admission: RE | Admit: 2018-03-14 | Discharge: 2018-03-14 | Disposition: A | Discharge status: Home / Self Care | Payer: Medicare Other | Source: Ambulatory Visit | Attending: Gastroenterology | Admitting: Gastroenterology

## 2018-03-14 ENCOUNTER — Encounter (HOSPITAL_COMMUNITY): Payer: Self-pay

## 2018-03-14 DIAGNOSIS — E785 Hyperlipidemia, unspecified: Secondary | ICD-10-CM | POA: Diagnosis not present

## 2018-03-14 DIAGNOSIS — Z888 Allergy status to other drugs, medicaments and biological substances status: Secondary | ICD-10-CM | POA: Insufficient documentation

## 2018-03-14 DIAGNOSIS — D124 Benign neoplasm of descending colon: Secondary | ICD-10-CM | POA: Insufficient documentation

## 2018-03-14 DIAGNOSIS — K640 First degree hemorrhoids: Secondary | ICD-10-CM | POA: Diagnosis not present

## 2018-03-14 DIAGNOSIS — Z981 Arthrodesis status: Secondary | ICD-10-CM | POA: Diagnosis not present

## 2018-03-14 DIAGNOSIS — D509 Iron deficiency anemia, unspecified: Secondary | ICD-10-CM | POA: Diagnosis not present

## 2018-03-14 DIAGNOSIS — Z8249 Family history of ischemic heart disease and other diseases of the circulatory system: Secondary | ICD-10-CM | POA: Insufficient documentation

## 2018-03-14 DIAGNOSIS — E119 Type 2 diabetes mellitus without complications: Secondary | ICD-10-CM | POA: Diagnosis not present

## 2018-03-14 DIAGNOSIS — M810 Age-related osteoporosis without current pathological fracture: Secondary | ICD-10-CM | POA: Diagnosis not present

## 2018-03-14 DIAGNOSIS — D127 Benign neoplasm of rectosigmoid junction: Secondary | ICD-10-CM | POA: Insufficient documentation

## 2018-03-14 DIAGNOSIS — Z87891 Personal history of nicotine dependence: Secondary | ICD-10-CM | POA: Insufficient documentation

## 2018-03-14 DIAGNOSIS — Z8673 Personal history of transient ischemic attack (TIA), and cerebral infarction without residual deficits: Secondary | ICD-10-CM | POA: Diagnosis not present

## 2018-03-14 DIAGNOSIS — D122 Benign neoplasm of ascending colon: Secondary | ICD-10-CM | POA: Insufficient documentation

## 2018-03-14 DIAGNOSIS — Z800 Family history of malignant neoplasm of digestive organs: Secondary | ICD-10-CM | POA: Diagnosis not present

## 2018-03-14 DIAGNOSIS — K449 Diaphragmatic hernia without obstruction or gangrene: Secondary | ICD-10-CM | POA: Diagnosis not present

## 2018-03-14 DIAGNOSIS — I10 Essential (primary) hypertension: Secondary | ICD-10-CM | POA: Diagnosis not present

## 2018-03-14 DIAGNOSIS — Z885 Allergy status to narcotic agent status: Secondary | ICD-10-CM | POA: Insufficient documentation

## 2018-03-14 HISTORY — PX: COLONOSCOPY WITH PROPOFOL: SHX5780

## 2018-03-14 HISTORY — PX: POLYPECTOMY: SHX5525

## 2018-03-14 HISTORY — PX: ESOPHAGOGASTRODUODENOSCOPY (EGD) WITH PROPOFOL: SHX5813

## 2018-03-14 HISTORY — PX: BIOPSY: SHX5522

## 2018-03-14 LAB — GLUCOSE, CAPILLARY: GLUCOSE-CAPILLARY: 121 mg/dL — AB (ref 70–99)

## 2018-03-14 SURGERY — COLONOSCOPY WITH PROPOFOL
Anesthesia: Monitor Anesthesia Care

## 2018-03-14 MED ORDER — PROPOFOL 10 MG/ML IV BOLUS
INTRAVENOUS | Status: AC
  Filled 2018-03-14: qty 40

## 2018-03-14 MED ORDER — PROPOFOL 10 MG/ML IV BOLUS
INTRAVENOUS | Status: DC | PRN
  Administered 2018-03-14: 30 mg via INTRAVENOUS
  Administered 2018-03-14: 50 mg via INTRAVENOUS
  Administered 2018-03-14: 20 mg via INTRAVENOUS

## 2018-03-14 MED ORDER — LACTATED RINGERS IV SOLN
INTRAVENOUS | Status: DC
  Administered 2018-03-14: 10:00:00 via INTRAVENOUS

## 2018-03-14 MED ORDER — PROPOFOL 500 MG/50ML IV EMUL
INTRAVENOUS | Status: DC | PRN
  Administered 2018-03-14: 100 ug/kg/min via INTRAVENOUS

## 2018-03-14 MED ORDER — SODIUM CHLORIDE 0.9 % IV SOLN: INTRAVENOUS | Status: DC

## 2018-03-14 MED ORDER — PHENYLEPHRINE 40 MCG/ML (10ML) SYRINGE FOR IV PUSH (FOR BLOOD PRESSURE SUPPORT)
PREFILLED_SYRINGE | INTRAVENOUS | Status: DC | PRN
  Administered 2018-03-14 (×2): 80 ug via INTRAVENOUS

## 2018-03-14 MED ORDER — PROPOFOL 10 MG/ML IV BOLUS
INTRAVENOUS | Status: AC
  Filled 2018-03-14: qty 20

## 2018-03-14 MED ORDER — LIDOCAINE 2% (20 MG/ML) 5 ML SYRINGE
INTRAMUSCULAR | Status: DC | PRN
  Administered 2018-03-14: 100 mg via INTRAVENOUS

## 2018-03-14 SURGICAL SUPPLY — 25 items

## 2018-03-14 NOTE — Interval H&P Note (Signed)
History and Physical Interval Note:  03/14/2018 11:25 AM  [name]  has presented today for surgery, with the diagnosis of Iron deficiency anemia.  The various methods of treatment have been discussed with the patient and family. After consideration of risks, benefits and other options for treatment, the patient has consented to  Procedure(s): COLONOSCOPY WITH PROPOFOL (N/A) ESOPHAGOGASTRODUODENOSCOPY (EGD) WITH PROPOFOL (N/A) as a surgical intervention.  The patient's history has been reviewed, patient examined, no change in status, stable for surgery.  I have reviewed the patient's chart and labs.  Questions were answered to the patient's satisfaction.     Lear Ng

## 2018-03-14 NOTE — Anesthesia Procedure Notes (Signed)
Procedure Name: MAC Date/Time: 03/14/2018 11:30 AM Performed by: Deliah Boston, CRNA Pre-anesthesia Checklist: Patient identified, Emergency Drugs available, Suction available and Patient being monitored Patient Re-evaluated:Patient Re-evaluated prior to induction Oxygen Delivery Method: Nasal cannula Induction Type: IV induction Placement Confirmation: positive ETCO2 and breath sounds checked- equal and bilateral

## 2018-03-14 NOTE — Op Note (Signed)
[facility] Patient Name: [name] Procedure Date: 03/14/2018 MRN: [id number] Attending MD: Lear Ng , MD Date of Birth: 1940/06/05 CSN: [id number] Age: 78 Admit Type: Outpatient Procedure:                Colonoscopy Indications:              Last colonoscopy: date unknown, Iron deficiency                            anemia Providers:                Lear Ng, MD, Carlyn Reichert, RN, Elspeth Cho Tech., Technician, Heide Scales, CRNA Referring MD:             Lujean Amel, MD Medicines:                Propofol per Anesthesia, Monitored Anesthesia Care Complications:            No immediate complications. Estimated Blood Loss:     Estimated blood loss was minimal. Procedure:                Pre-Anesthesia Assessment:                           - Prior to the procedure, a History and Physical                            was performed, and patient medications and                            allergies were reviewed. The patient's tolerance of                            previous anesthesia was also reviewed. The risks                            and benefits of the procedure and the sedation                            options and risks were discussed with the patient.                            All questions were answered, and informed consent                            was obtained. Prior Anticoagulants: The patient has                            taken dipyridamole, last dose was 3 days prior to                            procedure. ASA Grade Assessment: III - A patient  with severe systemic disease. After reviewing the                            risks and benefits, the patient was deemed in                            satisfactory condition to undergo the procedure.                           After obtaining informed consent, the colonoscope                            was passed under direct vision.  Throughout the                            procedure, the patient's blood pressure, pulse, and                            oxygen saturations were monitored continuously. The                            PCF-H190DL ([id number]) Olympus pediatric colonscope                            was introduced through the anus and advanced to the                            the cecum, identified by appendiceal orifice and                            ileocecal valve. The colonoscopy was extremely                            difficult due to significant looping, a tortuous                            colon and fair prep. Successful completion of the                            procedure was aided by straightening and shortening                            the scope to obtain bowel loop reduction, applying                            abdominal pressure and lavage. The patient                            tolerated the procedure well. The quality of the                            bowel preparation was fair and fair but repeated  irrigation led to a good and adequate prep. The                            terminal ileum, ileocecal valve, appendiceal                            orifice, and rectum were photographed. Scope In: 11:46:50 AM Scope Out: 12:18:27 PM Scope Withdrawal Time: 0 hours 21 minutes 33 seconds  Total Procedure Duration: 0 hours 31 minutes 37 seconds  Findings:      The perianal and digital rectal examinations were normal.      Three sessile polyps were found in the ascending colon. The polyps were       3 to 6 mm in size. These polyps were removed with a hot snare. Resection       and retrieval were complete. Estimated blood loss: none.      A 3 mm polyp was found in the descending colon. The polyp was sessile.       The polyp was removed with a hot snare. Resection was complete, but the       polyp tissue was not retrieved. Estimated blood loss: none.      A 3 mm polyp was found  in the descending colon. The polyp was sessile.       The polyp was removed with a cold snare. Resection and retrieval were       complete. Estimated blood loss was minimal.      A 2 mm polyp was found in the descending colon. The polyp was       semi-sessile. The polyp was removed with a cold biopsy forceps.       Resection and retrieval were complete. Estimated blood loss was minimal.      A 10 mm polyp was found in the recto-sigmoid colon. The polyp was       semi-pedunculated. The polyp was removed with a hot snare. Resection and       retrieval were complete. Estimated blood loss: none.      Internal hemorrhoids were found during retroflexion. The hemorrhoids       were medium-sized and Grade I (internal hemorrhoids that do not       prolapse). Impression:               - Preparation of the colon was fair.                           - Three 3 to 6 mm polyps in the ascending colon,                            removed with a hot snare. Resected and retrieved.                           - One 3 mm polyp in the descending colon, removed                            with a hot snare. Complete resection. Polyp tissue                            not retrieved.                           -  One 3 mm polyp in the descending colon, removed                            with a cold snare. Resected and retrieved.                           - One 2 mm polyp in the descending colon, removed                            with a cold biopsy forceps. Resected and retrieved.                           - One 10 mm polyp at the recto-sigmoid colon,                            removed with a hot snare. Resected and retrieved.                           - Internal hemorrhoids. Moderate Sedation:      Not Applicable - Patient had care per Anesthesia. Recommendation:           - Await pathology results.                           - Repeat colonoscopy for surveillance based on                            pathology results.                            - Resume dipyridamole at prior dose in 5 days.                            Refer to managing physician for further adjustment                            of therapy.                           - High fiber diet.                           - Patient has a contact number available for                            emergencies. The signs and symptoms of potential                            delayed complications were discussed with the                            patient. Return to normal activities tomorrow.                            Written discharge instructions were provided  to the                            patient. Procedure Code(s):        --- Professional ---                           [phone], Colonoscopy, flexible; with removal of                            tumor(s), polyp(s), or other lesion(s) by snare                            technique                           [id number], 99, Colonoscopy, flexible; with biopsy,                            single or multiple Diagnosis Code(s):        --- Professional ---                           D50.9, Iron deficiency anemia, unspecified                           D12.4, Benign neoplasm of descending colon                           D12.7, Benign neoplasm of rectosigmoid junction                           D12.2, Benign neoplasm of ascending colon                           K64.0, First degree hemorrhoids CPT copyright 2018 [employer]. All rights reserved. The codes documented in this report are preliminary and upon coder review may  be revised to meet current compliance requirements. Lear Ng, MD 03/14/2018 12:36:09 PM This report has been signed electronically. Number of Addenda: 0

## 2018-03-14 NOTE — Transfer of Care (Signed)
Immediate Anesthesia Transfer of Care Note  Patient: [name]  Procedure(s) Performed: Procedure(s): COLONOSCOPY WITH PROPOFOL (N/A) ESOPHAGOGASTRODUODENOSCOPY (EGD) WITH PROPOFOL (N/A) BIOPSY POLYPECTOMY  Patient Location: PACU  Anesthesia Type:MAC  Level of Consciousness: Patient easily awoken, sedated, comfortable, cooperative, following commands, responds to stimulation.   Airway & Oxygen Therapy: Patient spontaneously breathing, ventilating well, oxygen via simple oxygen mask.  Post-op Assessment: Report given to PACU RN, vital signs reviewed and stable, moving all extremities.   Post vital signs: Reviewed and stable.  Complications: No apparent anesthesia complications Last Vitals:  Vitals Value Taken Time  BP 108/60 03/14/2018 12:25 PM  Temp    Pulse 74 03/14/2018 12:27 PM  Resp 12 03/14/2018 12:27 PM  SpO2 100 % 03/14/2018 12:27 PM  Vitals shown include unvalidated device data.  Last Pain:  Vitals:   03/14/18 1018  TempSrc: Oral  PainSc: 0-No pain         Complications: No apparent anesthesia complications

## 2018-03-14 NOTE — H&P (Signed)
Date of Initial H&P: 03/13/2018  History reviewed, patient examined, no change in status, stable for surgery.

## 2018-03-14 NOTE — Discharge Instructions (Signed)

## 2018-03-14 NOTE — Op Note (Signed)
[facility] Patient Name: [name] Procedure Date: 03/14/2018 MRN: [id number] Attending MD: Lear Ng , MD Date of Birth: 20-Apr-1940 CSN: [id number] Age: 78 Admit Type: Outpatient Procedure:                Upper GI endoscopy Indications:              Iron deficiency anemia Providers:                Lear Ng, MD, Carlyn Reichert, RN, Elspeth Cho Tech., Technician, Heide Scales, CRNA Referring MD:             Lujean Amel, MD Medicines:                Propofol per Anesthesia, Monitored Anesthesia Care Complications:            No immediate complications. Estimated Blood Loss:     Estimated blood loss was minimal. Procedure:                Pre-Anesthesia Assessment:                           - Prior to the procedure, a History and Physical                            was performed, and patient medications and                            allergies were reviewed. The patient's tolerance of                            previous anesthesia was also reviewed. The risks                            and benefits of the procedure and the sedation                            options and risks were discussed with the patient.                            All questions were answered, and informed consent                            was obtained. Prior Anticoagulants: The patient has                            taken dipyridamole, last dose was 3 days prior to                            procedure. ASA Grade Assessment: III - A patient                            with severe systemic disease. After reviewing the  risks and benefits, the patient was deemed in                            satisfactory condition to undergo the procedure.                           After obtaining informed consent, the endoscope was                            passed under direct vision. Throughout the                            procedure, the  patient's blood pressure, pulse, and                            oxygen saturations were monitored continuously. The                            GIF-H190 ([id number]) Olympus gastroscope was                            introduced through the mouth, and advanced to the                            second part of duodenum. The upper GI endoscopy was                            accomplished without difficulty. The patient                            tolerated the procedure well. Scope In: Scope Out: Findings:      The examined esophagus was normal.      The Z-line was regular and was found 38 cm from the incisors.      A small hiatal hernia was present.      The exam of the stomach was otherwise normal.      The examined duodenum was normal. Biopsies for histology were taken with       a cold forceps for evaluation of celiac disease. Estimated blood loss       was minimal. Impression:               - Normal esophagus.                           - Z-line regular, 38 cm from the incisors.                           - Small hiatal hernia.                           - Normal examined duodenum. Biopsied. Moderate Sedation:      Not Applicable - Patient had care per Anesthesia. Recommendation:           - Patient has a contact number available for  emergencies. The signs and symptoms of potential                            delayed complications were discussed with the                            patient. Return to normal activities tomorrow.                            Written discharge instructions were provided to the                            patient.                           - Follow an antireflux regimen.                           - Await pathology results.                           - Resume dipyridamole at prior dose in 5 days.                            Refer to managing physician for further adjustment                            of therapy. Procedure Code(s):        ---  Professional ---                           [phone], Esophagogastroduodenoscopy, flexible,                            transoral; with biopsy, single or multiple Diagnosis Code(s):        --- Professional ---                           D50.9, Iron deficiency anemia, unspecified                           K44.9, Diaphragmatic hernia without obstruction or                            gangrene CPT copyright 2018 [employer] Association. All rights reserved. The codes documented in this report are preliminary and upon coder review may  be revised to meet current compliance requirements. Lear Ng, MD 03/14/2018 12:27:59 PM This report has been signed electronically. Number of Addenda: 0

## 2018-03-14 NOTE — Anesthesia Postprocedure Evaluation (Signed)
Anesthesia Post Note  Patient: [name]  Procedure([name]) Performed: COLONOSCOPY WITH PROPOFOL (N/A ) ESOPHAGOGASTRODUODENOSCOPY (EGD) WITH PROPOFOL (N/A ) BIOPSY POLYPECTOMY     Patient location during evaluation: PACU Anesthesia Type: MAC Level of consciousness: awake and alert Pain management: pain level controlled Vital Signs Assessment: post-procedure vital signs reviewed and stable Respiratory status: spontaneous breathing, nonlabored ventilation, respiratory function stable and patient connected to nasal cannula oxygen Cardiovascular status: stable and blood pressure returned to baseline Postop Assessment: no apparent nausea or vomiting Anesthetic complications: no    Last Vitals:  Vitals:   03/14/18 1018 03/14/18 1225  BP: (!) 156/59 108/60  Pulse: 72 79  Resp: 14 13  Temp: 36.5 C 36.6 C  SpO2: 98% 99%    Last Pain:  Vitals:   03/14/18 1225  TempSrc: Oral  PainSc: 0-No pain                 [name]

## 2018-03-14 NOTE — Anesthesia Preprocedure Evaluation (Signed)
Anesthesia Evaluation  Patient identified by MRN, date of birth, ID band Patient awake    Reviewed: Allergy & Precautions, NPO status , Patient's Chart, lab work & pertinent test results  Airway Mallampati: II  TM Distance: >3 FB Neck ROM: Full    Dental no notable dental hx.    Pulmonary neg pulmonary ROS, former smoker,    Pulmonary exam normal breath sounds clear to auscultation       Cardiovascular hypertension, Normal cardiovascular exam Rhythm:Regular Rate:Normal     Neuro/Psych TIAnegative psych ROS   GI/Hepatic negative GI ROS, Neg liver ROS,   Endo/Other  diabetesHypothyroidism   Renal/GU negative Renal ROS  negative genitourinary   Musculoskeletal negative musculoskeletal ROS (+)   Abdominal   Peds negative pediatric ROS (+)  Hematology  (+) anemia ,   Anesthesia Other Findings   Reproductive/Obstetrics negative OB ROS                             Anesthesia Physical Anesthesia Plan  ASA: III  Anesthesia Plan: MAC   Post-op Pain Management:    Induction: Intravenous  PONV Risk Score and Plan:   Airway Management Planned: Simple Face Mask  Additional Equipment:   Intra-op Plan:   Post-operative Plan:   Informed Consent: I have reviewed the patients History and Physical, chart, labs and discussed the procedure including the risks, benefits and alternatives for the proposed anesthesia with the patient or authorized representative who has indicated his/her understanding and acceptance.     Dental advisory given  Plan Discussed with: CRNA and Surgeon  Anesthesia Plan Comments:         Anesthesia Quick Evaluation

## 2018-03-30 ENCOUNTER — Ambulatory Visit: Payer: Medicare Other

## 2018-03-30 ENCOUNTER — Other Ambulatory Visit: Payer: Medicare Other

## 2018-06-09 ENCOUNTER — Other Ambulatory Visit: Payer: Self-pay | Admitting: Gastroenterology

## 2018-06-09 DIAGNOSIS — R1084 Generalized abdominal pain: Secondary | ICD-10-CM

## 2018-06-09 DIAGNOSIS — D509 Iron deficiency anemia, unspecified: Secondary | ICD-10-CM

## 2018-06-13 ENCOUNTER — Ambulatory Visit
Admission: RE | Admit: 2018-06-13 | Discharge: 2018-06-13 | Disposition: A | Discharge status: Home / Self Care | Payer: Medicare Other | Source: Ambulatory Visit | Attending: Gastroenterology | Admitting: Gastroenterology

## 2018-06-13 DIAGNOSIS — D509 Iron deficiency anemia, unspecified: Secondary | ICD-10-CM

## 2018-06-13 DIAGNOSIS — R1084 Generalized abdominal pain: Secondary | ICD-10-CM

## 2018-06-20 ENCOUNTER — Ambulatory Visit: Payer: Medicare Other | Admitting: Podiatry

## 2018-06-23 ENCOUNTER — Other Ambulatory Visit: Payer: Self-pay

## 2018-06-23 ENCOUNTER — Ambulatory Visit: Payer: Medicare Other | Admitting: Podiatry

## 2018-06-23 ENCOUNTER — Encounter: Payer: Self-pay | Admitting: Podiatry

## 2018-06-23 DIAGNOSIS — M79674 Pain in right toe(s): Secondary | ICD-10-CM

## 2018-06-23 DIAGNOSIS — B351 Tinea unguium: Secondary | ICD-10-CM | POA: Diagnosis not present

## 2018-06-23 DIAGNOSIS — L84 Corns and callosities: Secondary | ICD-10-CM | POA: Diagnosis not present

## 2018-06-23 DIAGNOSIS — M79675 Pain in left toe(s): Secondary | ICD-10-CM | POA: Diagnosis not present

## 2018-06-23 DIAGNOSIS — E1142 Type 2 diabetes mellitus with diabetic polyneuropathy: Secondary | ICD-10-CM

## 2018-06-23 NOTE — Patient Instructions (Signed)

## 2018-06-29 ENCOUNTER — Other Ambulatory Visit: Payer: Self-pay | Admitting: Adult Health

## 2018-07-04 NOTE — Progress Notes (Signed)
Subjective:  [name] presents to clinic today with cc of  painful, thick, discolored, elongated toenails 1-5 b/l that become tender and cannot cut because of thickness.  She relates she is still experiencing pain in both feet described as achy in nature. Denies any trauma. She takes Lyrica for neuropathy.  Pain is aggravated when wearing enclosed shoe gear.  [name], [name], MD is her PCP.    Current Outpatient Medications:  .  atorvastatin (LIPITOR) 20 MG tablet, Take 20 mg by mouth daily. , Disp: , Rfl: 0 .  calcitRIOL (ROCALTROL) 0.25 MCG capsule, Take 0.25 mcg by mouth 2 (two) times daily. , Disp: , Rfl:  .  calcium-vitamin D (OSCAL WITH D) 500-200 MG-UNIT per tablet, Take 1 tablet by mouth daily with breakfast., Disp: , Rfl:  .  diclofenac sodium (VOLTAREN) 1 % GEL, Apply 4 g topically 4 (four) times daily. (Patient taking differently: Apply 4 g topically 4 (four) times daily as needed (pain). ), Disp: 100 g, Rfl: 2 .  dipyridamole-aspirin (AGGRENOX) 25-200 MG per 12 hr capsule, Take 1 capsule by mouth 2 (two) times daily. , Disp: , Rfl:  .  famotidine (PEPCID) 20 MG tablet, Take 20 mg by mouth daily as needed for heartburn or indigestion., Disp: , Rfl:  .  ferrous sulfate 325 (65 FE) MG tablet, Take 325 mg by mouth daily with breakfast., Disp: , Rfl:  .  furosemide (LASIX) 20 MG tablet, Take 20 mg by mouth every other day. , Disp: , Rfl: 0 .  HUMULIN N 100 UNIT/ML injection, Inject 10 mg into the skin daily before breakfast. , Disp: , Rfl: 6 .  HYDROcodone-acetaminophen (NORCO/VICODIN) 5-325 MG tablet, Take 1 tablet by mouth every 4 (four) hours as needed for moderate pain. (Patient taking differently: Take 0.5-1 tablets by mouth 2 (two) times daily as needed for moderate pain. ), Disp: 10 tablet, Rfl: 0 .  levothyroxine (SYNTHROID, LEVOTHROID) 125 MCG tablet, Take 125 mcg by mouth daily before breakfast., Disp: , Rfl:  .  lisinopril (PRINIVIL,ZESTRIL) 40 MG tablet, Take 40 mg by  mouth daily. , Disp: , Rfl:  .  meclizine (ANTIVERT) 12.5 MG tablet, Take 12.5 mg by mouth daily as needed for dizziness., Disp: , Rfl:  .  montelukast (SINGULAIR) 10 MG tablet, Take 10 mg by mouth daily as needed., Disp: , Rfl: 5 .  nystatin ointment (MYCOSTATIN), Apply 1 application topically 2 (two) times daily as needed for itching., Disp: , Rfl: 0 .  polyethylene glycol (MIRALAX / GLYCOLAX) packet, Take 17 g by mouth daily as needed for moderate constipation. , Disp: , Rfl:  .  pregabalin (LYRICA) 100 MG capsule, Take 1 capsule (100 mg total) by mouth every 8 (eight) hours., Disp: 90 capsule, Rfl: 0 .  senna (SENOKOT) 8.6 MG TABS tablet, Take 1 tablet by mouth 2 (two) times daily as needed for mild constipation., Disp: , Rfl:  .  trimethoprim (TRIMPEX) 100 MG tablet, Take 100 mg by mouth daily., Disp: , Rfl:    Allergies  Allergen Reactions  . Pioglitazone Swelling  . Actos [Pioglitazone Hydrochloride] Swelling  . Ativan [Lorazepam]     swelling  . Cephalexin Other (See Comments)    Doesn't remember   . Oxycodone     This medication makes her feel sick  . Rosiglitazone     unknown  . Tramadol Other (See Comments)    hallucinations     Objective:  Physical Examination:  Vascular Examination: Capillary refill time  immediate x 10 digits.  Palpable DP/PT pulses b/l.  Digital hair absent b/l.  Skin temperature gradient WNL b/l.  Dermatological Examination: Skin with normal turgor, texture and tone b/l.  No open wounds b/l.  No interdigital macerations noted b/l.  Elongated, thick, discolored brittle toenails with subungual debris and pain on dorsal palpation of nailbeds 2-5 b/l.  Anonychia b/l great toe(s) with evidence of permanent total nail avulsion. Nailbed(s) completely epithelialized and intact.  Hyperkeratotic lesion nailbed left hallux with tenderness to palpation. No edema, no erythema, no drainage, no flocculence.  Musculoskeletal Examination: Muscle  strength 5/5 to all muscle groups b/l  No pain, crepitus or joint discomfort with active/passive ROM.  Neurological Examination: Sensation absent b/l with 10 gram monofilament.  Assessment: Mycotic nail infection with pain 1-5 b/l Nailbed callus left hallux NIDDM with neuropathy  Plan: 1. Toenails 1-5 b/l were debrided in length and girth without iatrogenic laceration. Calluses pared left hallux nailbed utilizing sterile scalpel blade without incident. Patient noted relief. We did discuss Rx for from [place] for pain cream, but she is already on Voltaren so we will have her continue to use that. Continue soft, supportive shoe gear daily. Report any pedal injuries to medical professional.  Follow up 3 months. Patient/POA to call should there be a question/concern in there interim.

## 2018-07-20 ENCOUNTER — Other Ambulatory Visit: Payer: Self-pay | Admitting: Podiatry

## 2018-09-13 ENCOUNTER — Ambulatory Visit
Admission: RE | Admit: 2018-09-13 | Discharge: 2018-09-13 | Disposition: A | Discharge status: Home / Self Care | Payer: Medicare Other | Source: Ambulatory Visit | Attending: Family Medicine | Admitting: Family Medicine

## 2018-09-13 ENCOUNTER — Other Ambulatory Visit: Payer: Self-pay

## 2018-09-13 DIAGNOSIS — Z1231 Encounter for screening mammogram for malignant neoplasm of breast: Secondary | ICD-10-CM

## 2018-09-13 DIAGNOSIS — M810 Age-related osteoporosis without current pathological fracture: Secondary | ICD-10-CM

## 2018-09-25 ENCOUNTER — Ambulatory Visit: Payer: Medicare Other | Admitting: Podiatry

## 2018-12-14 ENCOUNTER — Ambulatory Visit (HOSPITAL_COMMUNITY)
Admission: EM | Admit: 2018-12-14 | Discharge: 2018-12-14 | Disposition: A | Discharge status: Home / Self Care | Payer: Medicare Other | Attending: Internal Medicine | Admitting: Internal Medicine

## 2018-12-14 ENCOUNTER — Telehealth (HOSPITAL_COMMUNITY): Payer: Self-pay | Admitting: Internal Medicine

## 2018-12-14 ENCOUNTER — Ambulatory Visit (INDEPENDENT_AMBULATORY_CARE_PROVIDER_SITE_OTHER): Payer: Medicare Other

## 2018-12-14 ENCOUNTER — Other Ambulatory Visit: Payer: Self-pay

## 2018-12-14 ENCOUNTER — Encounter (HOSPITAL_COMMUNITY): Payer: Self-pay | Admitting: Emergency Medicine

## 2018-12-14 DIAGNOSIS — R0789 Other chest pain: Secondary | ICD-10-CM

## 2018-12-14 DIAGNOSIS — I10 Essential (primary) hypertension: Secondary | ICD-10-CM

## 2018-12-14 DIAGNOSIS — M25551 Pain in right hip: Secondary | ICD-10-CM

## 2018-12-14 MED ORDER — HYDROCODONE-ACETAMINOPHEN 5-325 MG PO TABS: 0.5000 | ORAL_TABLET | Freq: Two times a day (BID) | ORAL | Status: AC | PRN
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
## 2018-12-15 ENCOUNTER — Encounter

## 2018-12-15 ENCOUNTER — Ambulatory Visit: Payer: Medicare Other | Admitting: Podiatry

## 2018-12-20 ENCOUNTER — Other Ambulatory Visit: Payer: Self-pay

## 2018-12-20 ENCOUNTER — Encounter: Payer: Self-pay | Admitting: Podiatry

## 2018-12-20 ENCOUNTER — Ambulatory Visit (INDEPENDENT_AMBULATORY_CARE_PROVIDER_SITE_OTHER): Payer: Medicare Other | Admitting: Podiatry

## 2018-12-20 DIAGNOSIS — B351 Tinea unguium: Secondary | ICD-10-CM | POA: Diagnosis not present

## 2018-12-20 DIAGNOSIS — E1142 Type 2 diabetes mellitus with diabetic polyneuropathy: Secondary | ICD-10-CM | POA: Diagnosis not present

## 2018-12-20 DIAGNOSIS — M79675 Pain in left toe(s): Secondary | ICD-10-CM

## 2018-12-20 DIAGNOSIS — M79674 Pain in right toe(s): Secondary | ICD-10-CM | POA: Diagnosis not present

## 2018-12-20 DIAGNOSIS — L84 Corns and callosities: Secondary | ICD-10-CM

## 2018-12-20 NOTE — Progress Notes (Signed)
Subjective: [name] is a 78 y.o. y.o. female who presents today for preventative diabetic foot care.  Today, she relates painful nail bed callus left hallux which is aggravated when wearing enclosed shoe gear.  She also presents with cc of painful, discolored, thick toenailswith daily activities. Pain is aggravated when wearing enclosed shoe gear and relieved with periodic professional debridement.  Medications reviewed in chart.  Allergies  Allergen Reactions  . Pioglitazone Swelling  . Actos [Pioglitazone Hydrochloride] Swelling  . Ativan [Lorazepam]     swelling  . Cephalexin Other (See Comments)    Doesn't remember   . Oxycodone     This medication makes her feel sick  . Rosiglitazone     unknown  . Tramadol Other (See Comments)    hallucinations    Objective: 78 y.o. AAF, obese, in NAD. AAO x 3.  Vascular Examination: Capillary refill time to digits immediate b/l.  Dorsalis pedis pulses palpable b/l.  Posterior tibial pulses palpable b/l.  Digital hair absent b/l.  Skin temperature gradient WNL b/l.  Dermatological Examination: Skin with normal turgor, texture and tone b/l.  Toenails 1-5 b/l discolored, thick, dystrophic with subungual debris and pain with palpation to nailbeds due to thickness of nails.  Hyperkeratotic lesion dorsal nailbed left hallux with tenderness to palpation. No edema, no erythema, no drainage, no flocculence.  Anonychia b/l great toes with evidence of permanent total nail avulsion. Nailbed(s) completely epithelialized and intact.  Musculoskeletal: Muscle strength 5/5 to all LE muscle groups b/l.  Neurological: Sensation absent with 10 gram monofilament b/l.  Assessment: 1. Painful onychomycosis toenails 1-5 b/l 2.  Callus of nail bed left hallux 3.  NIDDM with neuropathy  Plan: 1. Continue diabetic foot care principles. Literature dispensed on today. 2. Toenails 1-5 b/l were debrided in length and girth without iatrogenic  bleeding. 3. Callus pared left hallux utilizing sterile scalpel blade without incident.  4. Patient to continue soft, supportive shoe gear daily. Will start process for diabetic shoes. 5. Patient to report any pedal injuries to medical professional immediately. 6. Follow up 9 weeks.  7. Patient/POA to call should there be a concern in the interim.

## 2018-12-20 NOTE — Patient Instructions (Signed)
Diabetes Mellitus and Foot Care Foot care is an important part of your health, especially when you have diabetes. Diabetes may cause you to have problems because of poor blood flow (circulation) to your feet and legs, which can cause your skin to:  Become thinner and drier.  Break more easily.  Heal more slowly.  Peel and crack. You may also have nerve damage (neuropathy) in your legs and feet, causing decreased feeling in them. This means that you may not notice minor injuries to your feet that could lead to more serious problems. Noticing and addressing any potential problems early is the best way to prevent future foot problems. How to care for your feet Foot hygiene  Wash your feet daily with warm water and mild soap. Do not use hot water. Then, pat your feet and the areas between your toes until they are completely dry. Do not soak your feet as this can dry your skin.  Trim your toenails straight across. Do not dig under them or around the cuticle. File the edges of your nails with an emery board or nail file.  Apply a moisturizing lotion or petroleum jelly to the skin on your feet and to dry, brittle toenails. Use lotion that does not contain alcohol and is unscented. Do not apply lotion between your toes. Shoes and socks  Wear clean socks or stockings every day. Make sure they are not too tight. Do not wear knee-high stockings since they may decrease blood flow to your legs.  Wear shoes that fit properly and have enough cushioning. Always look in your shoes before you put them on to be sure there are no objects inside.  To break in new shoes, wear them for just a few hours a day. This prevents injuries on your feet. Wounds, scrapes, corns, and calluses  Check your feet daily for blisters, cuts, bruises, sores, and redness. If you cannot see the bottom of your feet, use a mirror or ask someone for help.  Do not cut corns or calluses or try to remove them with medicine.  If you  find a minor scrape, cut, or break in the skin on your feet, keep it and the skin around it clean and dry. You may clean these areas with mild soap and water. Do not clean the area with peroxide, alcohol, or iodine.  If you have a wound, scrape, corn, or callus on your foot, look at it several times a day to make sure it is healing and not infected. Check for: ? Redness, swelling, or pain. ? Fluid or blood. ? Warmth. ? Pus or a bad smell. General instructions  Do not cross your legs. This may decrease blood flow to your feet.  Do not use heating pads or hot water bottles on your feet. They may burn your skin. If you have lost feeling in your feet or legs, you may not know this is happening until it is too late.  Protect your feet from hot and cold by wearing shoes, such as at the beach or on hot pavement.  Schedule a complete foot exam at least once a year (annually) or more often if you have foot problems. If you have foot problems, report any cuts, sores, or bruises to your health care provider immediately. Contact a health care provider if:  You have a medical condition that increases your risk of infection and you have any cuts, sores, or bruises on your feet.  You have an injury that is not   healing.  You have redness on your legs or feet.  You feel burning or tingling in your legs or feet.  You have pain or cramps in your legs and feet.  Your legs or feet are numb.  Your feet always feel cold.  You have pain around a toenail. Get help right away if:  You have a wound, scrape, corn, or callus on your foot and: ? You have pain, swelling, or redness that gets worse. ? You have fluid or blood coming from the wound, scrape, corn, or callus. ? Your wound, scrape, corn, or callus feels warm to the touch. ? You have pus or a bad smell coming from the wound, scrape, corn, or callus. ? You have a fever. ? You have a red line going up your leg. Summary  Check your feet every day  for cuts, sores, red spots, swelling, and blisters.  Moisturize feet and legs daily.  Wear shoes that fit properly and have enough cushioning.  If you have foot problems, report any cuts, sores, or bruises to your health care provider immediately.  Schedule a complete foot exam at least once a year (annually) or more often if you have foot problems. This information is not intended to replace advice given to you by your health care provider. Make sure you discuss any questions you have with your health care provider. Document Released: 12/19/1999 Document Revised: 02/02/2017 Document Reviewed: 01/23/2016 Elsevier Patient Education  2020 [employer]  

## 2018-12-27 ENCOUNTER — Encounter (HOSPITAL_COMMUNITY): Payer: Self-pay

## 2018-12-27 ENCOUNTER — Other Ambulatory Visit: Payer: Self-pay

## 2018-12-27 ENCOUNTER — Ambulatory Visit (HOSPITAL_COMMUNITY)
Admission: EM | Admit: 2018-12-27 | Discharge: 2018-12-27 | Disposition: A | Discharge status: Home / Self Care | Payer: Medicare Other | Attending: Physician Assistant | Admitting: Physician Assistant

## 2018-12-27 DIAGNOSIS — M79601 Pain in right arm: Secondary | ICD-10-CM | POA: Diagnosis not present

## 2018-12-27 DIAGNOSIS — M25551 Pain in right hip: Secondary | ICD-10-CM | POA: Diagnosis not present

## 2018-12-27 MED ORDER — DICLOFENAC SODIUM 1 % EX CREA: 2.0000 g | TOPICAL_CREAM | Freq: Three times a day (TID) | CUTANEOUS | 0 refills | Status: AC

## 2018-12-27 NOTE — Discharge Instructions (Addendum)
I want you to begin using the cream as prescribed: 2g which can be measured with the applicator that comes with the cream, 3 times a day.  You may also take regular strength tylenol : 2 tablets of the 325mg  dose, every 6 hours as needed for pain.   Utilize ice for 20-30 minutes a time and heat as preferred.   Return or follow up with your primary care if you are not having any improvement in your pain.   Go directly to the Emergency Department or call 911 if you have severe chest pain, shortness of breath, severe diarrhea or feel as though you might pass out.
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
## 2019-01-09 ENCOUNTER — Ambulatory Visit: Payer: Medicare Other | Attending: Family Medicine | Admitting: Physical Therapy

## 2019-01-09 ENCOUNTER — Other Ambulatory Visit: Payer: Self-pay

## 2019-01-09 DIAGNOSIS — M25511 Pain in right shoulder: Secondary | ICD-10-CM

## 2019-01-09 DIAGNOSIS — M6281 Muscle weakness (generalized): Secondary | ICD-10-CM | POA: Insufficient documentation

## 2019-01-09 DIAGNOSIS — M25551 Pain in right hip: Secondary | ICD-10-CM | POA: Diagnosis present

## 2019-01-09 DIAGNOSIS — M25611 Stiffness of right shoulder, not elsewhere classified: Secondary | ICD-10-CM | POA: Insufficient documentation
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
## 2019-01-15 ENCOUNTER — Other Ambulatory Visit: Payer: Self-pay

## 2019-01-15 ENCOUNTER — Encounter: Payer: Self-pay | Admitting: Physical Therapy

## 2019-01-15 ENCOUNTER — Ambulatory Visit: Payer: Medicare Other | Admitting: Physical Therapy

## 2019-01-15 DIAGNOSIS — M25611 Stiffness of right shoulder, not elsewhere classified: Secondary | ICD-10-CM

## 2019-01-15 DIAGNOSIS — M25511 Pain in right shoulder: Secondary | ICD-10-CM

## 2019-01-15 DIAGNOSIS — M25551 Pain in right hip: Secondary | ICD-10-CM

## 2019-01-15 DIAGNOSIS — M6281 Muscle weakness (generalized): Secondary | ICD-10-CM

## 2019-01-15 NOTE — Therapy (Signed)
[facility] [address] [place], [place], [address] Phone: [phone]   Fax:  [phone]  Physical Therapy Treatment  Patient Details  Name: [name] MRN: [id number] Date of Birth: 11-19-1940 Referring Provider (PT): Dr. London Pepper   Encounter Date: 01/15/2019  PT End of Session - 01/15/19 1017    Visit Number  2    Date for PT Re-Evaluation  03/06/19    Authorization Type  [employer] Medicare; 10th visit progress note; KX at visit 15    PT Start Time  1017    PT Stop Time  1105    PT Time Calculation (min)  48 min    Activity Tolerance  Patient limited by pain;Patient limited by fatigue    Behavior During Therapy  [facility] for tasks assessed/performed       Past Medical History:  Diagnosis Date  . Anemia   . Asthma   . Chest pain, atypical   . Diabetes mellitus   . GERD (gastroesophageal reflux disease)   . History of transient ischemic attack (TIA)   . Hyperlipidemia   . Hypertension   . OA (osteoarthritis)   . Thyroid carcinoma [facility])     Past Surgical History:  Procedure Laterality Date  . BACK SURGERY    . BIOPSY  03/14/2018   Procedure: BIOPSY;  Surgeon: Wilford Corner, MD;  [facility];  Service: Endoscopy;;  EGD and Colon  . BREAST BIOPSY    . CARDIAC CATHETERIZATION  03/18/2009   NORMAL LEFT VENTRICULAR SIZE AND CONTRACTILITY WITH NORMAL SYSTOLIC  FUNCTION. EF 60%  . CARDIOLITE STUDY     SHOWED A SIGNIFICANT REVERSIBLE ANTERIOR WALL DEFECT CONSISTENT WITH ISCHEMIA. EF 55%  . COLONOSCOPY WITH PROPOFOL N/A 03/14/2018   Procedure: COLONOSCOPY WITH PROPOFOL;  Surgeon: Wilford Corner, MD;  [facility];  Service: Endoscopy;  Laterality: N/A;  . ESOPHAGOGASTRODUODENOSCOPY (EGD) WITH PROPOFOL N/A 03/14/2018   Procedure: ESOPHAGOGASTRODUODENOSCOPY (EGD) WITH PROPOFOL;  Surgeon: Wilford Corner, MD;  [facility];  Service: Endoscopy;  Laterality: N/A;  . FEMUR IM  NAIL Right 04/25/2015   Procedure: INTRAMEDULLARY (IM) RIGHT  RETROGRADE FEMORAL NAILING;  Surgeon: Rod Can, MD;  [facility];  Service: Orthopedics;  Laterality: Right;  . IR FL GUIDED LOC OF NEEDLE/CATH TIP FOR SPINAL INJECTION RT  04/05/2017  . IR FLUORO GUIDE CV LINE RIGHT  04/08/2017  . IR US GUIDE VASC ACCESS RIGHT  04/08/2017  . LUMBAR FUSION    . POLYPECTOMY  03/14/2018   Procedure: POLYPECTOMY;  Surgeon: Wilford Corner, MD;  [facility];  Service: Endoscopy;;  . THYROIDECTOMY    . TOTAL KNEE ARTHROPLASTY     right    There were no vitals filed for this visit.  Subjective Assessment - 01/15/19 1019    Subjective  i have moderate shoulder pain today. Hip pain is abou the same.    Pertinent History  Bil TKR 2003 and 2010; 2017 right femur fracture with rod fixation;left rotator cuff surgery;CVA 2009 with right weakness; living in [place] [facility];  back surgery 15 years ago with screw fixation with staph infection; used to do water aerobics 3x/week; caregive Tues/Thurs    Limitations  Walking;House hold activities    How long can you stand comfortably?  sits in chair to cook    How long can you walk comfortably?  around the parking lot of [place] was too much    Diagnostic tests  x-ray for shoulder/hip negative  Patient Stated Goals  get better; pain relief    Currently in Pain?  Yes   Rt shoulder and hip. 5/10, 7/10 Sore                      [facility] Adult PT Treatment/Exercise - 01/15/19 0001      Knee/Hip Exercises: Aerobic   Nustep  L1 x 5 min slow, pt needs min asst to get RtLE into pedal      Knee/Hip Exercises: Seated   Sit to Sand  --   5x sit to stand 47 sec     Shoulder Exercises: Seated   Other Seated Exercises  How to do pendulum swings at home.    Other Seated Exercises  GH shoulder rolls 10x, VC to not abduct as much   pt already doind at home     Shoulder Exercises: Pulleys   Flexion  2 minutes      Modalities    Modalities  Electrical Stimulation      Moist Heat Therapy   Number Minutes Moist Heat  10 Minutes    Moist Heat Location  Shoulder   Concurrent with Estim     Electrical Stimulation   Electrical Stimulation Location  Rt shoulder    Electrical Stimulation Action  IFC    Electrical Stimulation Parameters  80-150 HZ to pt tolerance pt seated    Electrical Stimulation Goals  Pain      Manual Therapy   Manual Therapy  Soft tissue mobilization    Soft tissue mobilization  Level 1 Addaday assit to Rt shoulder/RTC. low tolerance pt tolerated 4 min. Soft tissues were "sore"             PT Education - 01/15/19 1110    Education Details  pendulum swings for HEP    Person(s) Educated  Patient    Methods  Explanation;Demonstration;Verbal cues    Comprehension  Verbalized understanding;Returned demonstration       PT Short Term Goals - 01/09/19 1914      PT SHORT TERM GOAL #1   Title  The patient will demonstrate compliance with an initial HEP for right shoulder and hip ROM and strengthening    Time  4    Period  Weeks    Status  New      PT SHORT TERM GOAL #2   Title  The patient will report a 25% improvement in right shoulder pain with dressing, grooming and feeding    Time  4    Period  Weeks    Status  New      PT SHORT TERM GOAL #3   Title  Right shoulder flexion/scaption to 90 degrees needed meal prep, dressing, grooming    Time  4    Period  Weeks    Status  New      PT SHORT TERM GOAL #4   Title  Decreased right hip pain by 25% with ambulation short distances    Time  4    Period  Weeks    Status  New      PT SHORT TERM GOAL #5   Title  The patient will be able to complete a 3 minute walk test    Time  4    Period  Weeks    Status  New      Additional Short Term Goals   Additional Short Term Goals  Yes      PT SHORT TERM GOAL #6  Title  Establish Timed up and Go score for LTG    Time  4    Period  Weeks      PT SHORT TERM GOAL #7   Title   Establish 5x sit to stand test for LTG    Time  4    Period  Weeks    Status  New        PT Long Term Goals - 01/09/19 1920      PT LONG TERM GOAL #1   Title  The patient will be independent with safe self progression of HEP    Time  8    Period  Weeks    Status  New    Target Date  03/06/19      PT LONG TERM GOAL #2   Title  The patient will report a 50% improvement in right shoulder and hip pain with usual ADLs    Time  8    Period  Weeks    Status  New      PT LONG TERM GOAL #3   Title  FOTO functional outcome score for the hip decreased from 71% limitation to 46%    Time  8    Period  Weeks      PT LONG TERM GOAL #4   Title  Quick DASH outcome score improved from 47 to 37.    Time  8    Period  Weeks    Status  New      PT LONG TERM GOAL #5   Title  The patient will be able to ambulate 350 feet in 6 minutes    Time  8    Period  Weeks    Status  New      Additional Long Term Goals   Additional Long Term Goals  Yes      PT LONG TERM GOAL #6   Title  The patient will able to lift a 1# object to an eye level shelf    Time  8    Period  Weeks    Status  New      PT LONG TERM GOAL #7   Title  Timed up and Go and 5x sit to stand tests  improved by 3 sec    Time  8    Period  Weeks    Status  New            Plan - 01/15/19 1028    Clinical Impression Statement  Pt arrives today with complaints of moderate soreness in her Rt shoulder and hip. She ambulates slowly and needs frequent rest breaks due to fatigue/shortness of breath. Shoulder was limited by pain when using the pulleys but shoulders rolls and pendulums felt "good." Pt will do pendulums at home. Pt tolerated the Nustep well for 4 min only before fatigue and soreness made her request to stop. Pt was able to participate in the 5x sit to stand test but not the TUG. This was mainly from running out of time due to the required rest breaks. Pt was agreeable to E-Stim at end of session for her pain.     Personal Factors and Comorbidities  Age;Fitness;Past/Current Experience;Comorbidity 1;Comorbidity 2;Comorbidity 3+    Comorbidities  osteoporosis; lumbar fusion; right femur fracture; bil TKR; CVA with right weakness; Diabetes; HTN    Examination-Activity Limitations  Bathing;Locomotion Level;Reach Overhead;Carry;Stand;Dressing;Toileting;Self Feeding    Examination-Participation Restrictions  Community Activity;Driving;Shop;Meal Prep;Other    Stability/Clinical Decision Making  Evolving/Moderate  complexity    Rehab Potential  Good    PT Frequency  2x / week    PT Duration  8 weeks    PT Treatment/Interventions  ADLs/Self Care Home Management;Cryotherapy;Electrical Stimulation;Aquatic Therapy;Moist Heat;Ultrasound;Therapeutic exercise;Neuromuscular re-education;Therapeutic activities;Functional mobility training;Patient/family education;Gait training;Manual techniques;Dry needling;Taping;Iontophoresis 4mg /ml Dexamethasone    PT Next Visit Plan  TUG next try seated UE ranger, Nustep,    Consulted and Agree with Plan of Care  Patient       Patient will benefit from skilled therapeutic intervention in order to improve the following deficits and impairments:  Difficulty walking, Decreased range of motion, Increased fascial restricitons, Impaired UE functional use, Pain, Decreased activity tolerance, Decreased strength, Decreased mobility  Visit Diagnosis: Stiffness of right shoulder, not elsewhere classified  Acute pain of right shoulder  Pain in right hip  Muscle weakness (generalized)     Problem List Patient Active Problem List   Diagnosis Date Noted  . Iron deficiency anemia, unspecified 03/14/2018  . Chronic pain syndrome 11/04/2017  . Thoracic radiculopathy 11/04/2017  . Pes anserinus bursitis of right knee 08/12/2017  . GERD without esophagitis 05/12/2017  . Pleuritic chest pain 05/04/2017  . Medication monitoring encounter 05/04/2017  . Dyslipidemia associated with type 2  diabetes mellitus ([place]) 04/20/2017  . Hypertension associated with diabetes ([place]) 04/20/2017  . Cerebrovascular accident (CVA) with involvement of right side of body ([place]) 04/20/2017  . Peripheral autonomic neuropathy due to diabetes mellitus ([place]) 04/20/2017  . Hypertensive renal disease with renal failure, stage 1-4 or unspecified chronic kidney disease 04/20/2017  . Constipation 04/06/2017  . Status post lumbar spinal fusion 04/03/2017  . Recurrent UTI 04/03/2017  . Chronic renal insufficiency 04/03/2017  . Normocytic anemia 04/03/2017  . Osteomyelitis of thoracic spine ([place]) 04/02/2017  . Thoracic discitis 04/02/2017  . DDD (degenerative disc disease), lumbar 12/31/2016  . Narcotic dependence ([place]) 10/24/2015  . Periprosthetic fracture around internal prosthetic right knee joint 04/25/2015  . Fracture, femur, distal, right, closed, initial encounter 04/24/2015  . Closed fracture of distal end of right femur, initial encounter ([place]) 04/24/2015  . Acute gout 10/17/2013  . Hypoparathyroidism ([place]) 03/02/2012  . Benign essential tremor 09/28/2010  . Combined hyperlipidemia associated with type 2 diabetes mellitus ([place]) 09/26/2009  . Osteopenia 09/26/2009  . Postoperative hypothyroidism 09/26/2009  . Multiple pulmonary nodules 08/22/2009  . OSTEOARTHRITIS, KNEES, BILATERAL 08/07/2009  . THYROID CANCER, HX OF 02/10/2009  . Allergic rhinitis 11/19/2008  . Spinal stenosis of lumbar region 03/26/2008  . Hypothyroidism 07/03/2007  . Insulin dependent type 2 diabetes mellitus, controlled ([place]) 07/03/2007  . Spinal epidural abscess 07/03/2007  . Benign essential HTN 07/03/2007  . PSOAS MUSCLE ABSCESS 06/09/2007    Alexie Samson, PTA 01/15/2019, 11:14 AM  [facility] [address] [place], [place], [address] Phone: [phone]   Fax:  [phone]  Name: ARWILDA GEORGIA MRN: [id number] Date of Birth: [birth year]

## 2019-01-17 ENCOUNTER — Encounter: Payer: Self-pay | Admitting: Physical Therapy

## 2019-01-17 ENCOUNTER — Ambulatory Visit: Payer: Medicare Other | Admitting: Physical Therapy

## 2019-01-17 ENCOUNTER — Other Ambulatory Visit: Payer: Self-pay

## 2019-01-17 DIAGNOSIS — M25511 Pain in right shoulder: Secondary | ICD-10-CM | POA: Diagnosis not present

## 2019-01-17 DIAGNOSIS — M6281 Muscle weakness (generalized): Secondary | ICD-10-CM

## 2019-01-17 DIAGNOSIS — M25611 Stiffness of right shoulder, not elsewhere classified: Secondary | ICD-10-CM

## 2019-01-17 DIAGNOSIS — M25551 Pain in right hip: Secondary | ICD-10-CM

## 2019-01-17 NOTE — Therapy (Signed)
[facility] [address] [place], [place], [address] Phone: [phone]   Fax:  [phone]  Physical Therapy Treatment  Patient Details  Name: [name] MRN: [id number] Date of Birth: 1940/02/27 Referring Provider (PT): Dr. London Pepper   Encounter Date: 01/17/2019  PT End of Session - 01/17/19 1148    Visit Number  3    Date for PT Re-Evaluation  03/06/19    Authorization Type  [employer]HC Medicare; 10th visit progress note; KX at visit 15    PT Start Time  1148    PT Stop Time  1240    PT Time Calculation (min)  52 min    Activity Tolerance  Patient limited by pain;Patient limited by fatigue    Behavior During Therapy  [facility] for tasks assessed/performed       Past Medical History:  Diagnosis Date  . Anemia   . Asthma   . Chest pain, atypical   . Diabetes mellitus   . GERD (gastroesophageal reflux disease)   . History of transient ischemic attack (TIA)   . Hyperlipidemia   . Hypertension   . OA (osteoarthritis)   . Thyroid carcinoma [facility])     Past Surgical History:  Procedure Laterality Date  . BACK SURGERY    . BIOPSY  03/14/2018   Procedure: BIOPSY;  Surgeon: Wilford Corner, MD;  [facility];  Service: Endoscopy;;  EGD and Colon  . BREAST BIOPSY    . CARDIAC CATHETERIZATION  03/18/2009   NORMAL LEFT VENTRICULAR SIZE AND CONTRACTILITY WITH NORMAL SYSTOLIC  FUNCTION. EF 60%  . CARDIOLITE STUDY     SHOWED A SIGNIFICANT REVERSIBLE ANTERIOR WALL DEFECT CONSISTENT WITH ISCHEMIA. EF 55%  . COLONOSCOPY WITH PROPOFOL N/A 03/14/2018   Procedure: COLONOSCOPY WITH PROPOFOL;  Surgeon: Wilford Corner, MD;  [facility];  Service: Endoscopy;  Laterality: N/A;  . ESOPHAGOGASTRODUODENOSCOPY (EGD) WITH PROPOFOL N/A 03/14/2018   Procedure: ESOPHAGOGASTRODUODENOSCOPY (EGD) WITH PROPOFOL;  Surgeon: Wilford Corner, MD;  [facility];  Service: Endoscopy;  Laterality: N/A;  . FEMUR IM  NAIL Right 04/25/2015   Procedure: INTRAMEDULLARY (IM) RIGHT  RETROGRADE FEMORAL NAILING;  Surgeon: Rod Can, MD;  [facility];  Service: Orthopedics;  Laterality: Right;  . IR FL GUIDED LOC OF NEEDLE/CATH TIP FOR SPINAL INJECTION RT  04/05/2017  . IR FLUORO GUIDE CV LINE RIGHT  04/08/2017  . IR US GUIDE VASC ACCESS RIGHT  04/08/2017  . LUMBAR FUSION    . POLYPECTOMY  03/14/2018   Procedure: POLYPECTOMY;  Surgeon: Wilford Corner, MD;  [facility];  Service: Endoscopy;;  . THYROIDECTOMY    . TOTAL KNEE ARTHROPLASTY     right    There were no vitals filed for this visit.  Subjective Assessment - 01/17/19 1155    Subjective  My shoulder feels better, not sore today. My Rt hip in the back is pretty sore.    Pertinent History  Bil TKR 2003 and 2010; 2017 right femur fracture with rod fixation;left rotator cuff surgery;CVA 2009 with right weakness; living in [place] [facility];  back surgery 15 years ago with screw fixation with staph infection; used to do water aerobics 3x/week; caregive Tues/Thurs    Currently in Pain?  Yes    Pain Score  6     Pain Location  Hip    Pain Orientation  Right;Posterior    Pain Descriptors / Indicators  Sore    Multiple Pain Sites  No  [facility] PT Assessment - 01/17/19 0001      Timed Up and Go Test   Normal TUG (seconds)  --   1 min 25 sec                  [facility] Adult PT Treatment/Exercise - 01/17/19 0001      Knee/Hip Exercises: Aerobic   Nustep  L1 x 7 min Slow, requires less asst to get Rt foot in pedal.      Knee/Hip Exercises: Seated   Knee/Hip Flexion  10x alternating: issued for HEP      Shoulder Exercises: Stretch   Other Shoulder Stretches  review of pendulums, good  return Systems developer Location  --   Rt posteriolateral hip   Electrical Stimulation Action  IFC    Electrical Stimulation Parameters  80-150 HZ    Electrical Stimulation Goals  Pain                PT Short Term Goals - 01/09/19 1914      PT SHORT TERM GOAL #1   Title  The patient will demonstrate compliance with an initial HEP for right shoulder and hip ROM and strengthening    Time  4    Period  Weeks    Status  New      PT SHORT TERM GOAL #2   Title  The patient will report a 25% improvement in right shoulder pain with dressing, grooming and feeding    Time  4    Period  Weeks    Status  New      PT SHORT TERM GOAL #3   Title  Right shoulder flexion/scaption to 90 degrees needed meal prep, dressing, grooming    Time  4    Period  Weeks    Status  New      PT SHORT TERM GOAL #4   Title  Decreased right hip pain by 25% with ambulation short distances    Time  4    Period  Weeks    Status  New      PT SHORT TERM GOAL #5   Title  The patient will be able to complete a 3 minute walk test    Time  4    Period  Weeks    Status  New      Additional Short Term Goals   Additional Short Term Goals  Yes      PT SHORT TERM GOAL #6   Title  Establish Timed up and Go score for LTG    Time  4    Period  Weeks      PT SHORT TERM GOAL #7   Title  Establish 5x sit to stand test for LTG    Time  4    Period  Weeks    Status  New        PT Long Term Goals - 01/09/19 1920      PT LONG TERM GOAL #1   Title  The patient will be independent with safe self progression of HEP    Time  8    Period  Weeks    Status  New    Target Date  03/06/19      PT LONG TERM GOAL #2   Title  The patient will report a 50% improvement in right shoulder and hip pain with usual ADLs    Time  8  Period  Weeks    Status  New      PT LONG TERM GOAL #3   Title  FOTO functional outcome score for the hip decreased from 71% limitation to 46%    Time  8    Period  Weeks      PT LONG TERM GOAL #4   Title  Quick DASH outcome score improved from 47 to 37.    Time  8    Period  Weeks    Status  New      PT LONG TERM GOAL #5   Title  The patient will be able to  ambulate 350 feet in 6 minutes    Time  8    Period  Weeks    Status  New      Additional Long Term Goals   Additional Long Term Goals  Yes      PT LONG TERM GOAL #6   Title  The patient will able to lift a 1# object to an eye level shelf    Time  8    Period  Weeks    Status  New      PT LONG TERM GOAL #7   Title  Timed up and Go and 5x sit to stand tests  improved by 3 sec    Time  8    Period  Weeks    Status  New            Plan - 01/17/19 1203    Clinical Impression Statement  Pt arrives reporting her RT shoulder was much better, "it is not sore today." Main complaint today is RT hip pain. The Nustep did not increase her pain like it did on her previous session. Pt feels the Estim is helpful to reducing her pain. Pt is compliant with her shoulder ROM exercises ( HEP was officially given as Medbridge was avaialble today for PTA). Seated marching giving for RT hip pain. Pt still fatigues easily. Pretty SOB after the TUG which was completed in 1 min 25 sec.    Personal Factors and Comorbidities  Age;Fitness;Past/Current Experience;Comorbidity 1;Comorbidity 2;Comorbidity 3+    Comorbidities  osteoporosis; lumbar fusion; right femur fracture; bil TKR; CVA with right weakness; Diabetes; HTN    Examination-Activity Limitations  Bathing;Locomotion Level;Reach Overhead;Carry;Stand;Dressing;Toileting;Self Feeding    Examination-Participation Restrictions  Community Activity;Driving;Shop;Meal Prep;Other    Stability/Clinical Decision Making  Evolving/Moderate complexity    Rehab Potential  Good    PT Frequency  2x / week    PT Duration  8 weeks    PT Treatment/Interventions  ADLs/Self Care Home Management;Cryotherapy;Electrical Stimulation;Aquatic Therapy;Moist Heat;Ultrasound;Therapeutic exercise;Neuromuscular re-education;Therapeutic activities;Functional mobility training;Patient/family education;Gait training;Manual techniques;Dry needling;Taping;Iontophoresis 4mg /ml Dexamethasone     PT Next Visit Plan  seated UE ranger, Nustep, review seated marching exercise, anything else for hip apin that pt can tolerate. She does not want to lay down on the mat or hi low table.    PT Home Exercise Plan  --       Patient will benefit from skilled therapeutic intervention in order to improve the following deficits and impairments:  Difficulty walking, Decreased range of motion, Increased fascial restricitons, Impaired UE functional use, Pain, Decreased activity tolerance, Decreased strength, Decreased mobility  Visit Diagnosis: Stiffness of right shoulder, not elsewhere classified  Acute pain of right shoulder  Pain in right hip  Muscle weakness (generalized)     Problem List Patient Active Problem List   Diagnosis Date Noted  . Iron deficiency  anemia, unspecified 03/14/2018  . Chronic pain syndrome 11/04/2017  . Thoracic radiculopathy 11/04/2017  . Pes anserinus bursitis of right knee 08/12/2017  . GERD without esophagitis 05/12/2017  . Pleuritic chest pain 05/04/2017  . Medication monitoring encounter 05/04/2017  . Dyslipidemia associated with type 2 diabetes mellitus ([place]) 04/20/2017  . Hypertension associated with diabetes ([place]) 04/20/2017  . Cerebrovascular accident (CVA) with involvement of right side of body ([place]) 04/20/2017  . Peripheral autonomic neuropathy due to diabetes mellitus ([place]) 04/20/2017  . Hypertensive renal disease with renal failure, stage 1-4 or unspecified chronic kidney disease 04/20/2017  . Constipation 04/06/2017  . Status post lumbar spinal fusion 04/03/2017  . Recurrent UTI 04/03/2017  . Chronic renal insufficiency 04/03/2017  . Normocytic anemia 04/03/2017  . Osteomyelitis of thoracic spine ([place]) 04/02/2017  . Thoracic discitis 04/02/2017  . DDD (degenerative disc disease), lumbar 12/31/2016  . Narcotic dependence ([place]) 10/24/2015  . Periprosthetic fracture around internal prosthetic right knee joint 04/25/2015  . Fracture, femur,  distal, right, closed, initial encounter 04/24/2015  . Closed fracture of distal end of right femur, initial encounter ([place]) 04/24/2015  . Acute gout 10/17/2013  . Hypoparathyroidism ([place]) 03/02/2012  . Benign essential tremor 09/28/2010  . Combined hyperlipidemia associated with type 2 diabetes mellitus ([place]) 09/26/2009  . Osteopenia 09/26/2009  . Postoperative hypothyroidism 09/26/2009  . Multiple pulmonary nodules 08/22/2009  . OSTEOARTHRITIS, KNEES, BILATERAL 08/07/2009  . THYROID CANCER, HX OF 02/10/2009  . Allergic rhinitis 11/19/2008  . Spinal stenosis of lumbar region 03/26/2008  . Hypothyroidism 07/03/2007  . Insulin dependent type 2 diabetes mellitus, controlled ([place]) 07/03/2007  . Spinal epidural abscess 07/03/2007  . Benign essential HTN 07/03/2007  . PSOAS MUSCLE ABSCESS 06/09/2007    Aloria Looper, PTA 01/17/2019, 12:29 PM  [facility] [address], [place], [place], [address] Phone: [phone]   Fax:  [phone]  Name: [name] MRN: [id number] Date of Birth: 03-18-1940  Access Code: [email or web address]  URL: [email or web address]  Date: 01/17/2019  Prepared by: Myrene Galas   Exercises  Seated March - 10 reps - 1 sets - 2x daily - 7x weekly  Seated Shoulder Pendulum Exercise - 10 reps - 1 sets - 3x daily - 7x weekly  Seated Shoulder Rolls - 10 reps - 3 sets - 3x daily - 7x weekly   URL: [email or web address]  Date: 01/17/2019  Prepared by: Myrene Galas   Exercises  Seated March - 10 reps - 1 sets - 2x daily - 7x weekly  Seated Shoulder Pendulum Exercise - 10 reps - 1 sets - 3x daily - 7x weekly  Seated Shoulder Rolls - 10 reps - 3 sets - 3x daily - 7x weekly

## 2019-01-19 ENCOUNTER — Ambulatory Visit: Payer: Medicare Other | Admitting: Orthotics

## 2019-01-24 ENCOUNTER — Encounter: Payer: Self-pay | Admitting: Physical Therapy

## 2019-01-24 ENCOUNTER — Other Ambulatory Visit: Payer: Self-pay

## 2019-01-24 ENCOUNTER — Ambulatory Visit: Payer: Medicare Other | Admitting: Physical Therapy

## 2019-01-24 DIAGNOSIS — M25611 Stiffness of right shoulder, not elsewhere classified: Secondary | ICD-10-CM

## 2019-01-24 DIAGNOSIS — M6281 Muscle weakness (generalized): Secondary | ICD-10-CM

## 2019-01-24 DIAGNOSIS — M25511 Pain in right shoulder: Secondary | ICD-10-CM | POA: Diagnosis not present

## 2019-01-24 DIAGNOSIS — M25551 Pain in right hip: Secondary | ICD-10-CM

## 2019-01-24 NOTE — Therapy (Signed)
[facility] [address] [place], [place], [address] Phone: [phone]   Fax:  [phone]  Physical Therapy Treatment  Patient Details  Name: [name] MRN: [id number] Date of Birth: 09/15/1940 Referring Provider (PT): Dr. London Pepper   Encounter Date: 01/24/2019  PT End of Session - 01/24/19 1155    Visit Number  4    Date for PT Re-Evaluation  03/06/19    Authorization Type  [employer] Medicare; 10th visit progress note; KX at visit 15    PT Start Time  1155    PT Stop Time  1245    PT Time Calculation (min)  50 min    Activity Tolerance  Patient tolerated treatment well    Behavior During Therapy  [facility] for tasks assessed/performed       Past Medical History:  Diagnosis Date  . Anemia   . Asthma   . Chest pain, atypical   . Diabetes mellitus   . GERD (gastroesophageal reflux disease)   . History of transient ischemic attack (TIA)   . Hyperlipidemia   . Hypertension   . OA (osteoarthritis)   . Thyroid carcinoma [facility])     Past Surgical History:  Procedure Laterality Date  . BACK SURGERY    . BIOPSY  03/14/2018   Procedure: BIOPSY;  Surgeon: Wilford Corner, MD;  [facility];  Service: Endoscopy;;  EGD and Colon  . BREAST BIOPSY    . CARDIAC CATHETERIZATION  03/18/2009   NORMAL LEFT VENTRICULAR SIZE AND CONTRACTILITY WITH NORMAL SYSTOLIC  FUNCTION. EF 60%  . CARDIOLITE STUDY     SHOWED A SIGNIFICANT REVERSIBLE ANTERIOR WALL DEFECT CONSISTENT WITH ISCHEMIA. EF 55%  . COLONOSCOPY WITH PROPOFOL N/A 03/14/2018   Procedure: COLONOSCOPY WITH PROPOFOL;  Surgeon: Wilford Corner, MD;  [facility];  Service: Endoscopy;  Laterality: N/A;  . ESOPHAGOGASTRODUODENOSCOPY (EGD) WITH PROPOFOL N/A 03/14/2018   Procedure: ESOPHAGOGASTRODUODENOSCOPY (EGD) WITH PROPOFOL;  Surgeon: Wilford Corner, MD;  [facility];  Service: Endoscopy;  Laterality: N/A;  . FEMUR IM NAIL Right 04/25/2015    Procedure: INTRAMEDULLARY (IM) RIGHT  RETROGRADE FEMORAL NAILING;  Surgeon: Rod Can, MD;  [facility];  Service: Orthopedics;  Laterality: Right;  . IR FL GUIDED LOC OF NEEDLE/CATH TIP FOR SPINAL INJECTION RT  04/05/2017  . IR FLUORO GUIDE CV LINE RIGHT  04/08/2017  . IR US GUIDE VASC ACCESS RIGHT  04/08/2017  . LUMBAR FUSION    . POLYPECTOMY  03/14/2018   Procedure: POLYPECTOMY;  Surgeon: Wilford Corner, MD;  [facility];  Service: Endoscopy;;  . THYROIDECTOMY    . TOTAL KNEE ARTHROPLASTY     right    There were no vitals filed for this visit.  Subjective Assessment - 01/24/19 1156    Subjective  My shoulder hurts a lot at night but not so bad during the day. My hip is ok today, I want to work on my shoulder today.    Pertinent History  Bil TKR 2003 and 2010; 2017 right femur fracture with rod fixation;left rotator cuff surgery;CVA 2009 with right weakness; living in [facility];  back surgery 15 years ago with screw fixation with staph infection; used to do water aerobics 3x/week; caregive Tues/Thurs    Limitations  Walking;House hold activities    Currently in Pain?  Yes    Pain Score  3     Pain Location  Shoulder    Pain Orientation  Right  Pain Descriptors / Indicators  Sore         [facility] PT Assessment - 01/24/19 0001      AROM   Right Shoulder Flexion  87 Degrees    Right Shoulder ABduction  95 Degrees                   [facility] Adult PT Treatment/Exercise - 01/24/19 0001      Knee/Hip Exercises: Aerobic   Nustep  L 1 x 8 min      Shoulder Exercises: Seated   Other Seated Exercises  UE ranger 2x10 in  3 planes     Other Seated Exercises  Scap squeezes 10x      Electrical Stimulation   Electrical Stimulation Location  Rt shoulder    Electrical Stimulation Action  IFC    Electrical Stimulation Parameters  80-150 HZ    Electrical Stimulation Goals  Pain      Iontophoresis   Type of Iontophoresis  Dexamethasone   #1  skin intact   Location  Rt shoulder point of greatest pain    Dose  1 ml    Time  6 hr wear               PT Short Term Goals - 01/24/19 1206      PT SHORT TERM GOAL #1   Title  The patient will demonstrate compliance with an initial HEP for right shoulder and hip ROM and strengthening    Time  4    Period  Weeks    Status  Achieved        PT Long Term Goals - 01/09/19 1920      PT LONG TERM GOAL #1   Title  The patient will be independent with safe self progression of HEP    Time  8    Period  Weeks    Status  New    Target Date  03/06/19      PT LONG TERM GOAL #2   Title  The patient will report a 50% improvement in right shoulder and hip pain with usual ADLs    Time  8    Period  Weeks    Status  New      PT LONG TERM GOAL #3   Title  FOTO functional outcome score for the hip decreased from 71% limitation to 46%    Time  8    Period  Weeks      PT LONG TERM GOAL #4   Title  Quick DASH outcome score improved from 47 to 37.    Time  8    Period  Weeks    Status  New      PT LONG TERM GOAL #5   Title  The patient will be able to ambulate 350 feet in 6 minutes    Time  8    Period  Weeks    Status  New      Additional Long Term Goals   Additional Long Term Goals  Yes      PT LONG TERM GOAL #6   Title  The patient will able to lift a 1# object to an eye level shelf    Time  8    Period  Weeks    Status  New      PT LONG TERM GOAL #7   Title  Timed up and Go and 5x sit to stand tests  improved by 3 sec  Time  8    Period  Weeks    Status  New            Plan - 01/24/19 1234    Clinical Impression Statement  Pt arrives today walking some better with her walker. Pt reports her hip feels better especially when she walks. Her shoulder "does better" during the day but really bothers her at night and keeps her from sleeping. She is considering asking her MD for a cortisone shot. We began the ionto patch today in hopes of reducing her shoulder  pain and possible inflammation. She tolerated the seated UE ranger very well, no increase pain and her AROM in flexion and abduction has greatly improved since eval.    Personal Factors and Comorbidities  Age;Fitness;Past/Current Experience;Comorbidity 1;Comorbidity 2;Comorbidity 3+    Comorbidities  osteoporosis; lumbar fusion; right femur fracture; bil TKR; CVA with right weakness; Diabetes; HTN    Examination-Activity Limitations  Bathing;Locomotion Level;Reach Overhead;Carry;Stand;Dressing;Toileting;Self Feeding    Examination-Participation Restrictions  Community Activity;Driving;Shop;Meal Prep;Other    Stability/Clinical Decision Making  Evolving/Moderate complexity    Rehab Potential  Good    PT Frequency  2x / week    PT Duration  8 weeks    PT Treatment/Interventions  ADLs/Self Care Home Management;Cryotherapy;Electrical Stimulation;Aquatic Therapy;Moist Heat;Ultrasound;Therapeutic exercise;Neuromuscular re-education;Therapeutic activities;Functional mobility training;Patient/family education;Gait training;Manual techniques;Dry needling;Taping;Iontophoresis 4mg /ml Dexamethasone    PT Next Visit Plan  Assess ionto patch, if ok admin #2, seated UE ranger AAROM    PT Home Exercise Plan  Access Code: [email or web address]    Consulted and Agree with Plan of Care  Patient       Patient will benefit from skilled therapeutic intervention in order to improve the following deficits and impairments:  Difficulty walking, Decreased range of motion, Increased fascial restricitons, Impaired UE functional use, Pain, Decreased activity tolerance, Decreased strength, Decreased mobility  Visit Diagnosis: Stiffness of right shoulder, not elsewhere classified  Acute pain of right shoulder  Pain in right hip  Muscle weakness (generalized)     Problem List Patient Active Problem List   Diagnosis Date Noted  . Iron deficiency anemia, unspecified 03/14/2018  . Chronic pain syndrome 11/04/2017  . Thoracic  radiculopathy 11/04/2017  . Pes anserinus bursitis of right knee 08/12/2017  . GERD without esophagitis 05/12/2017  . Pleuritic chest pain 05/04/2017  . Medication monitoring encounter 05/04/2017  . Dyslipidemia associated with type 2 diabetes mellitus ([place]) 04/20/2017  . Hypertension associated with diabetes ([place]) 04/20/2017  . Cerebrovascular accident (CVA) with involvement of right side of body ([place]) 04/20/2017  . Peripheral autonomic neuropathy due to diabetes mellitus ([place]) 04/20/2017  . Hypertensive renal disease with renal failure, stage 1-4 or unspecified chronic kidney disease 04/20/2017  . Constipation 04/06/2017  . Status post lumbar spinal fusion 04/03/2017  . Recurrent UTI 04/03/2017  . Chronic renal insufficiency 04/03/2017  . Normocytic anemia 04/03/2017  . Osteomyelitis of thoracic spine ([place]) 04/02/2017  . Thoracic discitis 04/02/2017  . DDD (degenerative disc disease), lumbar 12/31/2016  . Narcotic dependence ([place]) 10/24/2015  . Periprosthetic fracture around internal prosthetic right knee joint 04/25/2015  . Fracture, femur, distal, right, closed, initial encounter 04/24/2015  . Closed fracture of distal end of right femur, initial encounter ([place]) 04/24/2015  . Acute gout 10/17/2013  . Hypoparathyroidism ([place]) 03/02/2012  . Benign essential tremor 09/28/2010  . Combined hyperlipidemia associated with type 2 diabetes mellitus ([place]) 09/26/2009  . Osteopenia 09/26/2009  . Postoperative hypothyroidism 09/26/2009  . Multiple pulmonary nodules 08/22/2009  . OSTEOARTHRITIS,  KNEES, BILATERAL 08/07/2009  . THYROID CANCER, HX OF 02/10/2009  . Allergic rhinitis 11/19/2008  . Spinal stenosis of lumbar region 03/26/2008  . Hypothyroidism 07/03/2007  . Insulin dependent type 2 diabetes mellitus, controlled ([place]) 07/03/2007  . Spinal epidural abscess 07/03/2007  . Benign essential HTN 07/03/2007  . PSOAS MUSCLE ABSCESS 06/09/2007    Caragh Gasper, PTA 01/24/2019, 4:14  PM  [facility] [address], [place], [place], [address] Phone: [phone]   Fax:  [phone]  Name: SHAMECKA HOCUTT MRN: [id number] Date of Birth: Jul 11, 1940

## 2019-01-24 NOTE — Patient Instructions (Signed)

## 2019-01-26 ENCOUNTER — Ambulatory Visit: Payer: Medicare Other | Admitting: Physical Therapy

## 2019-01-31 ENCOUNTER — Other Ambulatory Visit: Payer: Self-pay

## 2019-01-31 ENCOUNTER — Ambulatory Visit: Payer: Medicare Other | Admitting: Physical Therapy

## 2019-01-31 ENCOUNTER — Encounter: Payer: Self-pay | Admitting: Physical Therapy

## 2019-01-31 DIAGNOSIS — M6281 Muscle weakness (generalized): Secondary | ICD-10-CM

## 2019-01-31 DIAGNOSIS — M25611 Stiffness of right shoulder, not elsewhere classified: Secondary | ICD-10-CM

## 2019-01-31 DIAGNOSIS — M25511 Pain in right shoulder: Secondary | ICD-10-CM

## 2019-01-31 DIAGNOSIS — M25551 Pain in right hip: Secondary | ICD-10-CM

## 2019-01-31 NOTE — Therapy (Signed)
[facility] [address], [place], [place], [address] Phone: [phone]   Fax:  [phone]  Physical Therapy Treatment  Patient Details  Name: [name] MRN: [id number] Date of Birth: August 27, 1940 Referring Provider (PT): Dr. London Pepper   Encounter Date: 01/31/2019  PT End of Session - 01/31/19 1154    Visit Number  5    Date for PT Re-Evaluation  03/06/19    Authorization Type  [employer] Medicare; 10th visit progress note; KX at visit 15    PT Start Time  1150    PT Stop Time  1231    PT Time Calculation (min)  41 min    Activity Tolerance  Patient tolerated treatment well    Behavior During Therapy  [facility] for tasks assessed/performed       Past Medical History:  Diagnosis Date  . Anemia   . Asthma   . Chest pain, atypical   . Diabetes mellitus   . GERD (gastroesophageal reflux disease)   . History of transient ischemic attack (TIA)   . Hyperlipidemia   . Hypertension   . OA (osteoarthritis)   . Thyroid carcinoma [facility])     Past Surgical History:  Procedure Laterality Date  . BACK SURGERY    . BIOPSY  03/14/2018   Procedure: BIOPSY;  Surgeon: Wilford Corner, MD;  [facility];  Service: Endoscopy;;  EGD and Colon  . BREAST BIOPSY    . CARDIAC CATHETERIZATION  03/18/2009   NORMAL LEFT VENTRICULAR SIZE AND CONTRACTILITY WITH NORMAL SYSTOLIC  FUNCTION. EF 60%  . CARDIOLITE STUDY     SHOWED A SIGNIFICANT REVERSIBLE ANTERIOR WALL DEFECT CONSISTENT WITH ISCHEMIA. EF 55%  . COLONOSCOPY WITH PROPOFOL N/A 03/14/2018   Procedure: COLONOSCOPY WITH PROPOFOL;  Surgeon: Wilford Corner, MD;  [facility];  Service: Endoscopy;  Laterality: N/A;  . ESOPHAGOGASTRODUODENOSCOPY (EGD) WITH PROPOFOL N/A 03/14/2018   Procedure: ESOPHAGOGASTRODUODENOSCOPY (EGD) WITH PROPOFOL;  Surgeon: Wilford Corner, MD;  [facility];  Service: Endoscopy;  Laterality: N/A;  . FEMUR IM NAIL Right 04/25/2015    Procedure: INTRAMEDULLARY (IM) RIGHT  RETROGRADE FEMORAL NAILING;  Surgeon: Rod Can, MD;  [facility];  Service: Orthopedics;  Laterality: Right;  . IR FL GUIDED LOC OF NEEDLE/CATH TIP FOR SPINAL INJECTION RT  04/05/2017  . IR FLUORO GUIDE CV LINE RIGHT  04/08/2017  . IR US GUIDE VASC ACCESS RIGHT  04/08/2017  . LUMBAR FUSION    . POLYPECTOMY  03/14/2018   Procedure: POLYPECTOMY;  Surgeon: Wilford Corner, MD;  [facility];  Service: Endoscopy;;  . THYROIDECTOMY    . TOTAL KNEE ARTHROPLASTY     right    There were no vitals filed for this visit.  Subjective Assessment - 01/31/19 1158    Subjective  i did not come last session because i had to do something for my children. The patch worked good but it didn't last.    Pertinent History  Bil TKR 2003 and 2010; 2017 right femur fracture with rod fixation;left rotator cuff surgery;CVA 2009 with right weakness; living in [facility];  back surgery 15 years ago with screw fixation with staph infection; used to do water aerobics 3x/week; caregive Tues/Thurs    Currently in Pain?  Yes   Rt shoulder currently 0/10, Rt hip 5-6/10   Pain Orientation  Right    Aggravating Factors   My shoulder hurts at night    Pain Relieving Factors  the ionto helped  [facility] Adult PT Treatment/Exercise - 01/31/19 0001      Knee/Hip Exercises: Aerobic   Nustep  L 1 x 9 min    Other Aerobic  3 mn walk test with RW: 140 feet with 3 stops to catch her breath      Shoulder Exercises: Seated   Other Seated Exercises  UE ranger 20x in 3 planes    Other Seated Exercises  Scap squeezes 10x      Iontophoresis   Type of Iontophoresis  Dexamethasone   #2 skin intact   Location  Rt shoulder point of greatest pain    Dose  1 ml    Time  6 hr wear               PT Short Term Goals - 01/31/19 1214      PT SHORT TERM GOAL #4   Title  Decreased right hip pain by 25% with ambulation short  distances    Time  4    Period  Weeks    Status  Achieved   25%       PT Long Term Goals - 01/09/19 1920      PT LONG TERM GOAL #1   Title  The patient will be independent with safe self progression of HEP    Time  8    Period  Weeks    Status  New    Target Date  03/06/19      PT LONG TERM GOAL #2   Title  The patient will report a 50% improvement in right shoulder and hip pain with usual ADLs    Time  8    Period  Weeks    Status  New      PT LONG TERM GOAL #3   Title  FOTO functional outcome score for the hip decreased from 71% limitation to 46%    Time  8    Period  Weeks      PT LONG TERM GOAL #4   Title  Quick DASH outcome score improved from 47 to 37.    Time  8    Period  Weeks    Status  New      PT LONG TERM GOAL #5   Title  The patient will be able to ambulate 350 feet in 6 minutes    Time  8    Period  Weeks    Status  New      Additional Long Term Goals   Additional Long Term Goals  Yes      PT LONG TERM GOAL #6   Title  The patient will able to lift a 1# object to an eye level shelf    Time  8    Period  Weeks    Status  New      PT LONG TERM GOAL #7   Title  Timed up and Go and 5x sit to stand tests  improved by 3 sec    Time  8    Period  Weeks    Status  New            Plan - 01/31/19 1234    Clinical Impression Statement  Pt arrives reporting she liked the ionto patch, felt it worked but "wants it to last longer." Pt received her second patch today. Skin tolerates medicine and adhesive. Pt was able to participate in 3 min walk test with her RW. She does struggle with her breathing and  required about 3 stops  to catch her breath. No complaints of hip pain when walking. Pt did meet STG for walking with 25% less pain.    Personal Factors and Comorbidities  Age;Fitness;Past/Current Experience;Comorbidity 1;Comorbidity 2;Comorbidity 3+    Comorbidities  osteoporosis; lumbar fusion; right femur fracture; bil TKR; CVA with right weakness;  Diabetes; HTN    Examination-Activity Limitations  Bathing;Locomotion Level;Reach Overhead;Carry;Stand;Dressing;Toileting;Self Feeding    Examination-Participation Restrictions  Community Activity;Driving;Shop;Meal Prep;Other    Stability/Clinical Decision Making  Evolving/Moderate complexity    Rehab Potential  Good    PT Frequency  2x / week    PT Duration  8 weeks    PT Treatment/Interventions  ADLs/Self Care Home Management;Cryotherapy;Electrical Stimulation;Aquatic Therapy;Moist Heat;Ultrasound;Therapeutic exercise;Neuromuscular re-education;Therapeutic activities;Functional mobility training;Patient/family education;Gait training;Manual techniques;Dry needling;Taping;Iontophoresis 4mg /ml Dexamethasone    PT Next Visit Plan  ionto #3, measure ROM of RT shoulder    PT Home Exercise Plan  Access Code: [email or web address]       Patient will benefit from skilled therapeutic intervention in order to improve the following deficits and impairments:  Difficulty walking, Decreased range of motion, Increased fascial restricitons, Impaired UE functional use, Pain, Decreased activity tolerance, Decreased strength, Decreased mobility  Visit Diagnosis: Stiffness of right shoulder, not elsewhere classified  Acute pain of right shoulder  Pain in right hip  Muscle weakness (generalized)     Problem List Patient Active Problem List   Diagnosis Date Noted  . Iron deficiency anemia, unspecified 03/14/2018  . Chronic pain syndrome 11/04/2017  . Thoracic radiculopathy 11/04/2017  . Pes anserinus bursitis of right knee 08/12/2017  . GERD without esophagitis 05/12/2017  . Pleuritic chest pain 05/04/2017  . Medication monitoring encounter 05/04/2017  . Dyslipidemia associated with type 2 diabetes mellitus ([place]) 04/20/2017  . Hypertension associated with diabetes ([place]) 04/20/2017  . Cerebrovascular accident (CVA) with involvement of right side of body ([place]) 04/20/2017  . Peripheral autonomic neuropathy due  to diabetes mellitus ([place]) 04/20/2017  . Hypertensive renal disease with renal failure, stage 1-4 or unspecified chronic kidney disease 04/20/2017  . Constipation 04/06/2017  . Status post lumbar spinal fusion 04/03/2017  . Recurrent UTI 04/03/2017  . Chronic renal insufficiency 04/03/2017  . Normocytic anemia 04/03/2017  . Osteomyelitis of thoracic spine ([place]) 04/02/2017  . Thoracic discitis 04/02/2017  . DDD (degenerative disc disease), lumbar 12/31/2016  . Narcotic dependence ([place]) 10/24/2015  . Periprosthetic fracture around internal prosthetic right knee joint 04/25/2015  . Fracture, femur, distal, right, closed, initial encounter 04/24/2015  . Closed fracture of distal end of right femur, initial encounter ([place]) 04/24/2015  . Acute gout 10/17/2013  . Hypoparathyroidism ([place]) 03/02/2012  . Benign essential tremor 09/28/2010  . Combined hyperlipidemia associated with type 2 diabetes mellitus ([place]) 09/26/2009  . Osteopenia 09/26/2009  . Postoperative hypothyroidism 09/26/2009  . Multiple pulmonary nodules 08/22/2009  . OSTEOARTHRITIS, KNEES, BILATERAL 08/07/2009  . THYROID CANCER, HX OF 02/10/2009  . Allergic rhinitis 11/19/2008  . Spinal stenosis of lumbar region 03/26/2008  . Hypothyroidism 07/03/2007  . Insulin dependent type 2 diabetes mellitus, controlled ([place]) 07/03/2007  . Spinal epidural abscess 07/03/2007  . Benign essential HTN 07/03/2007  . PSOAS MUSCLE ABSCESS 06/09/2007    Kaiden Pech, PTA 01/31/2019, 12:56 PM  [facility] [address], [place], [place], [address] Phone: [phone]   Fax:  [phone]  Name: [name] MRN: [id number] Date of Birth: March 13, 1940

## 2019-02-02 ENCOUNTER — Ambulatory Visit: Payer: Medicare Other | Admitting: Physical Therapy

## 2019-02-06 ENCOUNTER — Ambulatory Visit: Payer: Medicare Other | Attending: Family Medicine | Admitting: Physical Therapy

## 2019-02-06 ENCOUNTER — Other Ambulatory Visit: Payer: Self-pay

## 2019-02-06 DIAGNOSIS — M25611 Stiffness of right shoulder, not elsewhere classified: Secondary | ICD-10-CM | POA: Insufficient documentation

## 2019-02-06 DIAGNOSIS — M25551 Pain in right hip: Secondary | ICD-10-CM | POA: Insufficient documentation

## 2019-02-06 DIAGNOSIS — M25511 Pain in right shoulder: Secondary | ICD-10-CM | POA: Diagnosis present

## 2019-02-06 DIAGNOSIS — M6281 Muscle weakness (generalized): Secondary | ICD-10-CM | POA: Insufficient documentation

## 2019-02-06 NOTE — Therapy (Signed)
[facility] [address] [place], [place], [address] Phone: [phone]   Fax:  [phone]  Physical Therapy Treatment  Patient Details  Name: [name] MRN: [id number] Date of Birth: [birth year] Referring Provider (PT): Dr. London Pepper   Encounter Date: 02/06/2019  PT End of Session - 02/06/19 1740    Visit Number  6    Date for PT Re-Evaluation  03/06/19    Authorization Type  [employer] Medicare; 10th visit progress note; KX at visit 15    PT Start Time  1445    PT Stop Time  1529    PT Time Calculation (min)  44 min    Activity Tolerance  Patient tolerated treatment well       Past Medical History:  Diagnosis Date  . Anemia   . Asthma   . Chest pain, atypical   . Diabetes mellitus   . GERD (gastroesophageal reflux disease)   . History of transient ischemic attack (TIA)   . Hyperlipidemia   . Hypertension   . OA (osteoarthritis)   . Thyroid carcinoma [facility])     Past Surgical History:  Procedure Laterality Date  . BACK SURGERY    . BIOPSY  03/14/2018   Procedure: BIOPSY;  Surgeon: Wilford Corner, MD;  [facility]: [facility];  Service: Endoscopy;;  EGD and Colon  . BREAST BIOPSY    . CARDIAC CATHETERIZATION  03/18/2009   NORMAL LEFT VENTRICULAR SIZE AND CONTRACTILITY WITH NORMAL SYSTOLIC  FUNCTION. EF 60%  . CARDIOLITE STUDY     SHOWED A SIGNIFICANT REVERSIBLE ANTERIOR WALL DEFECT CONSISTENT WITH ISCHEMIA. EF 55%  . COLONOSCOPY WITH PROPOFOL N/A 03/14/2018   Procedure: COLONOSCOPY WITH PROPOFOL;  Surgeon: Wilford Corner, MD;  [facility];  Service: Endoscopy;  Laterality: N/A;  . ESOPHAGOGASTRODUODENOSCOPY (EGD) WITH PROPOFOL N/A 03/14/2018   Procedure: ESOPHAGOGASTRODUODENOSCOPY (EGD) WITH PROPOFOL;  Surgeon: Wilford Corner, MD;  [facility];  Service: Endoscopy;  Laterality: N/A;  . FEMUR IM NAIL Right 04/25/2015   Procedure: INTRAMEDULLARY (IM) RIGHT  RETROGRADE FEMORAL  NAILING;  Surgeon: Rod Can, MD;  [facility];  Service: Orthopedics;  Laterality: Right;  . IR FL GUIDED LOC OF NEEDLE/CATH TIP FOR SPINAL INJECTION RT  04/05/2017  . IR FLUORO GUIDE CV LINE RIGHT  04/08/2017  . IR US GUIDE VASC ACCESS RIGHT  04/08/2017  . LUMBAR FUSION    . POLYPECTOMY  03/14/2018   Procedure: POLYPECTOMY;  Surgeon: Wilford Corner, MD;  [facility];  Service: Endoscopy;;  . THYROIDECTOMY    . TOTAL KNEE ARTHROPLASTY     right    There were no vitals filed for this visit.  Subjective Assessment - 02/06/19 1448    Subjective  I'm not hurting as bad since they gave me a shot in my shoulder.  Some hip pain.    Pertinent History  Bil TKR 2003 and 2010; 2017 right femur fracture with rod fixation;left rotator cuff surgery;CVA 2009 with right weakness; living in [facility];  back surgery 15 years ago with screw fixation with staph infection; used to do water aerobics 3x/week; caregive Tues/Thurs    Currently in Pain?  Yes    Pain Score  7     Pain Location  Hip    Pain Orientation  Right    Pain Type  Acute pain    Multiple Pain Sites  No    Pain Score  0    Pain Location  Shoulder  Pain Orientation  Right         [facility] PT Assessment - 02/06/19 0001      AROM   Right Shoulder Flexion  118 Degrees    Right Shoulder ABduction  120 Degrees    Right Shoulder Internal Rotation  --   right mid lateral ribs   Right Shoulder External Rotation  50 Degrees      Timed Up and Go Test   Normal TUG (seconds)  30.51                   [facility] Adult PT Treatment/Exercise - 02/06/19 0001      Knee/Hip Exercises: Stretches   Active Hamstring Stretch  Right;Left;3 reps;30 seconds    Active Hamstring Stretch Limitations  seated       Knee/Hip Exercises: Aerobic   Nustep  L 1 x 10 min while discussing progress       Knee/Hip Exercises: Standing   Other Standing Knee Exercises  pt demos mini squats she does holding on the railing at  home 5x       Knee/Hip Exercises: Seated   Long Arc Quad  Right;Left;10 reps    Long Arc Quad Limitations  green band around feet    Clamshell with TheraBand  Green   20x     Iontophoresis   Type of Iontophoresis  Dexamethasone    Location  right buttock     Dose  1 ml     Time  6 hr wear patch       Manual Therapy   Soft tissue mobilization  Addaday instrument assisted soft tissue to right gluteals and piriformis with patient in standing leaning forward on tall table since she prefers not to lie down              PT Education - 02/06/19 1734    Education Details  Access Code: [email or web address] seated clams, seated leg extension, seated HS stretch    Person(s) Educated  Patient    Methods  Explanation;Demonstration;Handout    Comprehension  Returned demonstration;Verbalized understanding       PT Short Term Goals - 02/06/19 1452      PT SHORT TERM GOAL #1   Title  The patient will demonstrate compliance with an initial HEP for right shoulder and hip ROM and strengthening    Status  Achieved      PT SHORT TERM GOAL #2   Title  The patient will report a 25% improvement in right shoulder pain with dressing, grooming and feeding    Status  Achieved      PT SHORT TERM GOAL #3   Title  Right shoulder flexion/scaption to 90 degrees needed meal prep, dressing, grooming    Status  Achieved      PT SHORT TERM GOAL #4   Title  Decreased right hip pain by 25% with ambulation short distances    Status  Achieved      PT SHORT TERM GOAL #5   Title  The patient will be able to complete a 3 minute walk test    Time  4    Period  Weeks    Status  On-going        PT Long Term Goals - 01/09/19 1920      PT LONG TERM GOAL #1   Title  The patient will be independent with safe self progression of HEP    Time  8    Period  Weeks  Status  New    Target Date  03/06/19      PT LONG TERM GOAL #2   Title  The patient will report a 50% improvement in right shoulder and hip pain with  usual ADLs    Time  8    Period  Weeks    Status  New      PT LONG TERM GOAL #3   Title  FOTO functional outcome score for the hip decreased from 71% limitation to 46%    Time  8    Period  Weeks      PT LONG TERM GOAL #4   Title  Quick DASH outcome score improved from 47 to 37.    Time  8    Period  Weeks    Status  New      PT LONG TERM GOAL #5   Title  The patient will be able to ambulate 350 feet in 6 minutes    Time  8    Period  Weeks    Status  New      Additional Long Term Goals   Additional Long Term Goals  Yes      PT LONG TERM GOAL #6   Title  The patient will able to lift a 1# object to an eye level shelf    Time  8    Period  Weeks    Status  New      PT LONG TERM GOAL #7   Title  Timed up and Go and 5x sit to stand tests  improved by 3 sec    Time  8    Period  Weeks    Status  New            Plan - 02/06/19 1507    Clinical Impression Statement  The patient requests to focus treatment on her hip today since her shoulder is feeling so much better.  Her shoulder ROM measurements with elevation and internal rotation have significantly improved compared to previous visits.  Her Timed up and Go time also significantly improved from 1 min 25 sec to only 30 minutes today.  She is able to perform low level lower extremity strengthening with muscle fatigue reported but without pain exacerbation.  She reports good pain relief with soft tissue mobilization to gluteals and piriformis.  She is receptive to ionto initiation to right posterior hip region and understands wear time.  Therapist monitoring response with all intervnentions.    Rehab Potential  Good    PT Frequency  2x / week    PT Duration  8 weeks    PT Treatment/Interventions  ADLs/Self Care Home Management;Cryotherapy;Electrical Stimulation;Aquatic Therapy;Moist Heat;Ultrasound;Therapeutic exercise;Neuromuscular re-education;Therapeutic activities;Functional mobility training;Patient/family  education;Gait training;Manual techniques;Dry needling;Taping;Iontophoresis 4mg /ml Dexamethasone    PT Next Visit Plan  ionto to right posterior hip;  manual therapy to gluteals and piriformis;  LE strengthening;  shoulder ROM;  low level shoulder strengthening (rows, extensions with band);  Nu-Step    PT Home Exercise Plan  Access Code: [email or web address]       Patient will benefit from skilled therapeutic intervention in order to improve the following deficits and impairments:  Difficulty walking, Decreased range of motion, Increased fascial restricitons, Impaired UE functional use, Pain, Decreased activity tolerance, Decreased strength, Decreased mobility  Visit Diagnosis: Stiffness of right shoulder, not elsewhere classified  Acute pain of right shoulder  Pain in right hip  Muscle weakness (generalized)     Problem List Patient Active  Problem List   Diagnosis Date Noted  . Iron deficiency anemia, unspecified 03/14/2018  . Chronic pain syndrome 11/04/2017  . Thoracic radiculopathy 11/04/2017  . Pes anserinus bursitis of right knee 08/12/2017  . GERD without esophagitis 05/12/2017  . Pleuritic chest pain 05/04/2017  . Medication monitoring encounter 05/04/2017  . Dyslipidemia associated with type 2 diabetes mellitus ([place]) 04/20/2017  . Hypertension associated with diabetes ([place]) 04/20/2017  . Cerebrovascular accident (CVA) with involvement of right side of body ([place]) 04/20/2017  . Peripheral autonomic neuropathy due to diabetes mellitus ([place]) 04/20/2017  . Hypertensive renal disease with renal failure, stage 1-4 or unspecified chronic kidney disease 04/20/2017  . Constipation 04/06/2017  . Status post lumbar spinal fusion 04/03/2017  . Recurrent UTI 04/03/2017  . Chronic renal insufficiency 04/03/2017  . Normocytic anemia 04/03/2017  . Osteomyelitis of thoracic spine ([place]) 04/02/2017  . Thoracic discitis 04/02/2017  . DDD (degenerative disc disease), lumbar 12/31/2016  . Narcotic  dependence ([place]) 10/24/2015  . Periprosthetic fracture around internal prosthetic right knee joint 04/25/2015  . Fracture, femur, distal, right, closed, initial encounter 04/24/2015  . Closed fracture of distal end of right femur, initial encounter ([place]) 04/24/2015  . Acute gout 10/17/2013  . Hypoparathyroidism ([place]) 03/02/2012  . Benign essential tremor 09/28/2010  . Combined hyperlipidemia associated with type 2 diabetes mellitus ([place]) 09/26/2009  . Osteopenia 09/26/2009  . Postoperative hypothyroidism 09/26/2009  . Multiple pulmonary nodules 08/22/2009  . OSTEOARTHRITIS, KNEES, BILATERAL 08/07/2009  . THYROID CANCER, HX OF 02/10/2009  . Allergic rhinitis 11/19/2008  . Spinal stenosis of lumbar region 03/26/2008  . Hypothyroidism 07/03/2007  . Insulin dependent type 2 diabetes mellitus, controlled ([place]) 07/03/2007  . Spinal epidural abscess 07/03/2007  . Benign essential HTN 07/03/2007  . PSOAS MUSCLE ABSCESS 06/09/2007   Ruben Im, PT 02/06/19 5:48 PM Phone: [phone] Fax: [phone] Alvera Singh 02/06/2019, 5:48 PM  [facility] [address] [place], [place], [address] Phone: [phone]   Fax:  [phone]  Name: [name] MRN: [id number] Date of Birth: [birth year]

## 2019-02-06 NOTE — Patient Instructions (Signed)
Access Code: [email or web address]  URL: [email or web address]  Date: 02/06/2019  Prepared by: Ruben Im   Exercises Seated March - 10 reps - 1 sets - 2x daily - 7x weekly Seated Shoulder Pendulum Exercise - 10 reps - 1 sets - 3x daily - 7x weekly Seated Shoulder Rolls - 10 reps - 3 sets - 3x daily - 7x weekly Seated Hamstring Stretch - 5 reps - 1 sets - 30 hold - 1x daily - 7x weekly Seated Hip Abduction - 10 reps - 1 sets - 1x daily - 7x weekly Seated Knee Extension with Resistance - 10 reps - 1 sets - 1x daily - 7x weekly

## 2019-02-08 ENCOUNTER — Other Ambulatory Visit: Payer: Self-pay

## 2019-02-08 ENCOUNTER — Ambulatory Visit: Payer: Medicare Other | Admitting: Physical Therapy

## 2019-02-08 DIAGNOSIS — M25551 Pain in right hip: Secondary | ICD-10-CM

## 2019-02-08 DIAGNOSIS — M25611 Stiffness of right shoulder, not elsewhere classified: Secondary | ICD-10-CM

## 2019-02-08 DIAGNOSIS — M6281 Muscle weakness (generalized): Secondary | ICD-10-CM

## 2019-02-08 DIAGNOSIS — M25511 Pain in right shoulder: Secondary | ICD-10-CM

## 2019-02-08 NOTE — Therapy (Signed)
[facility] [address] [place], [place], [address] Phone: [phone]   Fax:  [phone]  Physical Therapy Treatment  Patient Details  Name: [name] MRN: [id number] Date of Birth: 10/06/1940 Referring Provider (PT): Dr. London Pepper   Encounter Date: 02/08/2019  PT End of Session - 02/08/19 1641    Visit Number  7    Date for PT Re-Evaluation  03/06/19    Authorization Type  [employer] Medicare; 10th visit progress note; KX at visit 15    PT Start Time  1616    PT Stop Time  1654    PT Time Calculation (min)  38 min       Past Medical History:  Diagnosis Date  . Anemia   . Asthma   . Chest pain, atypical   . Diabetes mellitus   . GERD (gastroesophageal reflux disease)   . History of transient ischemic attack (TIA)   . Hyperlipidemia   . Hypertension   . OA (osteoarthritis)   . Thyroid carcinoma [facility])     Past Surgical History:  Procedure Laterality Date  . BACK SURGERY    . BIOPSY  03/14/2018   Procedure: BIOPSY;  Surgeon: Wilford Corner, MD;  [facility];  Service: Endoscopy;;  EGD and Colon  . BREAST BIOPSY    . CARDIAC CATHETERIZATION  03/18/2009   NORMAL LEFT VENTRICULAR SIZE AND CONTRACTILITY WITH NORMAL SYSTOLIC  FUNCTION. EF 60%  . CARDIOLITE STUDY     SHOWED A SIGNIFICANT REVERSIBLE ANTERIOR WALL DEFECT CONSISTENT WITH ISCHEMIA. EF 55%  . COLONOSCOPY WITH PROPOFOL N/A 03/14/2018   Procedure: COLONOSCOPY WITH PROPOFOL;  Surgeon: Wilford Corner, MD;  [facility];  Service: Endoscopy;  Laterality: N/A;  . ESOPHAGOGASTRODUODENOSCOPY (EGD) WITH PROPOFOL N/A 03/14/2018   Procedure: ESOPHAGOGASTRODUODENOSCOPY (EGD) WITH PROPOFOL;  Surgeon: Wilford Corner, MD;  [facility];  Service: Endoscopy;  Laterality: N/A;  . FEMUR IM NAIL Right 04/25/2015   Procedure: INTRAMEDULLARY (IM) RIGHT  RETROGRADE FEMORAL NAILING;  Surgeon: Rod Can, MD;  [facility];   Service: Orthopedics;  Laterality: Right;  . IR FL GUIDED LOC OF NEEDLE/CATH TIP FOR SPINAL INJECTION RT  04/05/2017  . IR FLUORO GUIDE CV LINE RIGHT  04/08/2017  . IR US GUIDE VASC ACCESS RIGHT  04/08/2017  . LUMBAR FUSION    . POLYPECTOMY  03/14/2018   Procedure: POLYPECTOMY;  Surgeon: Wilford Corner, MD;  [facility];  Service: Endoscopy;;  . THYROIDECTOMY    . TOTAL KNEE ARTHROPLASTY     right    There were no vitals filed for this visit.  Subjective Assessment - 02/08/19 1614    Subjective  No pain right now but some shoulder pain at night. Hip pain intermittently.     Pertinent History  Bil TKR 2003 and 2010; 2017 right femur fracture with rod fixation;left rotator cuff surgery;CVA 2009 with right weakness; living in [facility];  back surgery 15 years ago with screw fixation with staph infection; used to do water aerobics 3x/week; caregive Tues/Thurs    Currently in Pain?  No/denies    Pain Score  0-No pain    Pain Location  Hip or shoulder at the present                      [facility] Adult PT Treatment/Exercise - 02/08/19 0001      Knee/Hip Exercises: Stretches   Active Hamstring Stretch  Right;Left;3 reps;30 seconds  Active Hamstring Stretch Limitations  seated       Knee/Hip Exercises: Aerobic   Nustep  L2 10 min while discussing progress/status       Knee/Hip Exercises: Seated   Clamshell with TheraBand  Green   15x   Other Seated Knee/Hip Exercises  green band single leg press out 10x right/left     Other Seated Knee/Hip Exercises  seated dead lift with green band 10x     Sit to Sand  10 reps;without UE support   from very high table; 5x with 5# holding at waist      Iontophoresis   Type of Iontophoresis  Dexamethasone    Location  right buttock     Dose  1 ml     Time  6 hr wear patch       Manual Therapy   Soft tissue mobilization  Addaday instrument assisted soft tissue to right gluteals and piriformis with patient in  standing leaning forward on tall table since she prefers not to lie down                PT Short Term Goals - 02/06/19 1452      PT SHORT TERM GOAL #1   Title  The patient will demonstrate compliance with an initial HEP for right shoulder and hip ROM and strengthening    Status  Achieved      PT SHORT TERM GOAL #2   Title  The patient will report a 25% improvement in right shoulder pain with dressing, grooming and feeding    Status  Achieved      PT SHORT TERM GOAL #3   Title  Right shoulder flexion/scaption to 90 degrees needed meal prep, dressing, grooming    Status  Achieved      PT SHORT TERM GOAL #4   Title  Decreased right hip pain by 25% with ambulation short distances    Status  Achieved      PT SHORT TERM GOAL #5   Title  The patient will be able to complete a 3 minute walk test    Time  4    Period  Weeks    Status  On-going        PT Long Term Goals - 01/09/19 1920      PT LONG TERM GOAL #1   Title  The patient will be independent with safe self progression of HEP    Time  8    Period  Weeks    Status  New    Target Date  03/06/19      PT LONG TERM GOAL #2   Title  The patient will report a 50% improvement in right shoulder and hip pain with usual ADLs    Time  8    Period  Weeks    Status  New      PT LONG TERM GOAL #3   Title  FOTO functional outcome score for the hip decreased from 71% limitation to 46%    Time  8    Period  Weeks      PT LONG TERM GOAL #4   Title  Quick DASH outcome score improved from 47 to 37.    Time  8    Period  Weeks    Status  New      PT LONG TERM GOAL #5   Title  The patient will be able to ambulate 350 feet in 6 minutes    Time  8    Period  Weeks    Status  New      Additional Long Term Goals   Additional Long Term Goals  Yes      PT LONG TERM GOAL #6   Title  The patient will able to lift a 1# object to an eye level shelf    Time  8    Period  Weeks    Status  New      PT LONG TERM GOAL #7    Title  Timed up and Go and 5x sit to stand tests  improved by 3 sec    Time  8    Period  Weeks    Status  New            Plan - 02/08/19 1658    Clinical Impression Statement  The patient demonstrates continued good mobility of the right shoulder elevation since recent injection and therefore she requests more treatment focus on her hip.   Improved right HS length and she is able to progress resistance and intensity of LE strengthening without exacerbation of pain.   Decreased gluteal and piriformis tender points noted  with soft tissue mobilization.  She reports good pain relief with iontophoresis.  Therapist closely monitoring response with all interventions.    Comorbidities  osteoporosis; lumbar fusion; right femur fracture; bil TKR; CVA with right weakness; Diabetes; HTN    Examination-Participation Restrictions  Community Activity;Driving;Shop;Meal Prep;Other    Rehab Potential  Good    PT Frequency  2x / week    PT Duration  8 weeks    PT Treatment/Interventions  ADLs/Self Care Home Management;Cryotherapy;Electrical Stimulation;Aquatic Therapy;Moist Heat;Ultrasound;Therapeutic exercise;Neuromuscular re-education;Therapeutic activities;Functional mobility training;Patient/family education;Gait training;Manual techniques;Dry needling;Taping;Iontophoresis 4mg /ml Dexamethasone    PT Next Visit Plan  ionto to right posterior hip;  manual therapy to gluteals and piriformis;  LE strengthening;  shoulder ROM;  low level shoulder strengthening (rows, extensions with band);  Nu-Step;  Recheck 3 min walk test;  Quick DASH, FOTO and TUG at visit 10    PT Home Exercise Plan  Access Code: [email or web address]       Patient will benefit from skilled therapeutic intervention in order to improve the following deficits and impairments:  Difficulty walking, Decreased range of motion, Increased fascial restricitons, Impaired UE functional use, Pain, Decreased activity tolerance, Decreased strength, Decreased  mobility  Visit Diagnosis: Stiffness of right shoulder, not elsewhere classified  Acute pain of right shoulder  Pain in right hip  Muscle weakness (generalized)     Problem List Patient Active Problem List   Diagnosis Date Noted  . Iron deficiency anemia, unspecified 03/14/2018  . Chronic pain syndrome 11/04/2017  . Thoracic radiculopathy 11/04/2017  . Pes anserinus bursitis of right knee 08/12/2017  . GERD without esophagitis 05/12/2017  . Pleuritic chest pain 05/04/2017  . Medication monitoring encounter 05/04/2017  . Dyslipidemia associated with type 2 diabetes mellitus ([place]) 04/20/2017  . Hypertension associated with diabetes ([place]) 04/20/2017  . Cerebrovascular accident (CVA) with involvement of right side of body ([place]) 04/20/2017  . Peripheral autonomic neuropathy due to diabetes mellitus ([place]) 04/20/2017  . Hypertensive renal disease with renal failure, stage 1-4 or unspecified chronic kidney disease 04/20/2017  . Constipation 04/06/2017  . Status post lumbar spinal fusion 04/03/2017  . Recurrent UTI 04/03/2017  . Chronic renal insufficiency 04/03/2017  . Normocytic anemia 04/03/2017  . Osteomyelitis of thoracic spine ([place]) 04/02/2017  . Thoracic discitis 04/02/2017  . DDD (degenerative disc disease), lumbar 12/31/2016  .  Narcotic dependence ([place]) 10/24/2015  . Periprosthetic fracture around internal prosthetic right knee joint 04/25/2015  . Fracture, femur, distal, right, closed, initial encounter 04/24/2015  . Closed fracture of distal end of right femur, initial encounter ([place]) 04/24/2015  . Acute gout 10/17/2013  . Hypoparathyroidism ([place]) 03/02/2012  . Benign essential tremor 09/28/2010  . Combined hyperlipidemia associated with type 2 diabetes mellitus ([place]) 09/26/2009  . Osteopenia 09/26/2009  . Postoperative hypothyroidism 09/26/2009  . Multiple pulmonary nodules 08/22/2009  . OSTEOARTHRITIS, KNEES, BILATERAL 08/07/2009  . THYROID CANCER, HX OF  02/10/2009  . Allergic rhinitis 11/19/2008  . Spinal stenosis of lumbar region 03/26/2008  . Hypothyroidism 07/03/2007  . Insulin dependent type 2 diabetes mellitus, controlled ([place]) 07/03/2007  . Spinal epidural abscess 07/03/2007  . Benign essential HTN 07/03/2007  . PSOAS MUSCLE ABSCESS 06/09/2007   Ruben Im, PT 02/08/19 5:11 PM Phone: [phone] Fax: [phone] Alvera Singh 02/08/2019, 5:10 PM  [facility] [address], [place], [place], [address] Phone: [phone]   Fax:  [phone]  Name: KLEIGH HOELZER MRN: [id number] Date of Birth: [birth year]

## 2019-02-12 ENCOUNTER — Other Ambulatory Visit: Payer: Self-pay

## 2019-02-12 ENCOUNTER — Ambulatory Visit: Payer: Medicare Other | Admitting: Orthotics

## 2019-02-12 DIAGNOSIS — B351 Tinea unguium: Secondary | ICD-10-CM

## 2019-02-12 DIAGNOSIS — L84 Corns and callosities: Secondary | ICD-10-CM

## 2019-02-12 NOTE — Progress Notes (Signed)

## 2019-02-14 ENCOUNTER — Ambulatory Visit: Payer: Medicare Other | Admitting: Physical Therapy

## 2019-02-14 ENCOUNTER — Other Ambulatory Visit: Payer: Self-pay

## 2019-02-14 ENCOUNTER — Encounter: Payer: Self-pay | Admitting: Physical Therapy

## 2019-02-14 DIAGNOSIS — M6281 Muscle weakness (generalized): Secondary | ICD-10-CM

## 2019-02-14 DIAGNOSIS — M25511 Pain in right shoulder: Secondary | ICD-10-CM

## 2019-02-14 DIAGNOSIS — M25551 Pain in right hip: Secondary | ICD-10-CM

## 2019-02-14 DIAGNOSIS — M25611 Stiffness of right shoulder, not elsewhere classified: Secondary | ICD-10-CM | POA: Diagnosis not present

## 2019-02-14 NOTE — Therapy (Signed)
[facility] [address], [place], [place], [address] Phone: [phone]   Fax:  [phone]  Physical Therapy Treatment  Patient Details  Name: [name] MRN: [id number] Date of Birth: September 29, 1940 Referring Provider (PT): Dr. London Pepper   Encounter Date: 02/14/2019  PT End of Session - 02/14/19 1104    Visit Number  8    Date for PT Re-Evaluation  03/06/19    Authorization Type  [employer] Medicare; 10th visit progress note; KX at visit 15    PT Start Time  1104    PT Stop Time  1145    PT Time Calculation (min)  41 min    Activity Tolerance  Patient tolerated treatment well    Behavior During Therapy  [facility] for tasks assessed/performed       Past Medical History:  Diagnosis Date  . Anemia   . Asthma   . Chest pain, atypical   . Diabetes mellitus   . GERD (gastroesophageal reflux disease)   . History of transient ischemic attack (TIA)   . Hyperlipidemia   . Hypertension   . OA (osteoarthritis)   . Thyroid carcinoma [facility])     Past Surgical History:  Procedure Laterality Date  . BACK SURGERY    . BIOPSY  03/14/2018   Procedure: BIOPSY;  Surgeon: Wilford Corner, MD;  Location: [facility];  Service: Endoscopy;;  EGD and Colon  . BREAST BIOPSY    . CARDIAC CATHETERIZATION  03/18/2009   NORMAL LEFT VENTRICULAR SIZE AND CONTRACTILITY WITH NORMAL SYSTOLIC  FUNCTION. EF 60%  . CARDIOLITE STUDY     SHOWED A SIGNIFICANT REVERSIBLE ANTERIOR WALL DEFECT CONSISTENT WITH ISCHEMIA. EF 55%  . COLONOSCOPY WITH PROPOFOL N/A 03/14/2018   Procedure: COLONOSCOPY WITH PROPOFOL;  Surgeon: Wilford Corner, MD;  [facility];  Service: Endoscopy;  Laterality: N/A;  . ESOPHAGOGASTRODUODENOSCOPY (EGD) WITH PROPOFOL N/A 03/14/2018   Procedure: ESOPHAGOGASTRODUODENOSCOPY (EGD) WITH PROPOFOL;  Surgeon: Wilford Corner, MD;  [facility];  Service: Endoscopy;  Laterality: N/A;  . FEMUR IM NAIL Right 04/25/2015    Procedure: INTRAMEDULLARY (IM) RIGHT  RETROGRADE FEMORAL NAILING;  Surgeon: Rod Can, MD;  [facility]: [facility];  Service: Orthopedics;  Laterality: Right;  . IR FL GUIDED LOC OF NEEDLE/CATH TIP FOR SPINAL INJECTION RT  04/05/2017  . IR FLUORO GUIDE CV LINE RIGHT  04/08/2017  . IR US GUIDE VASC ACCESS RIGHT  04/08/2017  . LUMBAR FUSION    . POLYPECTOMY  03/14/2018   Procedure: POLYPECTOMY;  Surgeon: Wilford Corner, MD;  [facility];  Service: Endoscopy;;  . THYROIDECTOMY    . TOTAL KNEE ARTHROPLASTY     right    There were no vitals filed for this visit.  Subjective Assessment - 02/14/19 1107    Subjective  No pain right now but shoulder still aches at night. If I walk a lot my hip will hurt. The patch on my buttocks helps decrease my pain.    Pertinent History  Bil TKR 2003 and 2010; 2017 right femur fracture with rod fixation;left rotator cuff surgery;CVA 2009 with right weakness; living in [facility];  back surgery 15 years ago with screw fixation with staph infection; used to do water aerobics 3x/week; caregive Tues/Thurs    Limitations  Walking;House hold activities    Currently in Pain?  No/denies    Multiple Pain Sites  No  [place] Adult PT Treatment/Exercise - 02/14/19 0001      Knee/Hip Exercises: Aerobic   Nustep  L2 10 min PTA present to monitor      Knee/Hip Exercises: Seated   Long Arc Quad  Strengthening;Both;2 sets;10 reps;Weights    Long Arc Quad Weight  3 lbs.    Clamshell with TheraBand  Green   15x2   Marching  AROM;Both;1 set;10 reps    Marching Weights  3 lbs.      Shoulder Exercises: Seated   Other Seated Exercises  UE ranger 20x in each direction       Iontophoresis   Type of Iontophoresis  Dexamethasone   3#   Location  right buttock     Dose  1 ml     Time  6 hr wear patch                PT Short Term Goals - 02/06/19 1452      PT SHORT TERM GOAL #1   Title  The patient will  demonstrate compliance with an initial HEP for right shoulder and hip ROM and strengthening    Status  Achieved      PT SHORT TERM GOAL #2   Title  The patient will report a 25% improvement in right shoulder pain with dressing, grooming and feeding    Status  Achieved      PT SHORT TERM GOAL #3   Title  Right shoulder flexion/scaption to 90 degrees needed meal prep, dressing, grooming    Status  Achieved      PT SHORT TERM GOAL #4   Title  Decreased right hip pain by 25% with ambulation short distances    Status  Achieved      PT SHORT TERM GOAL #5   Title  The patient will be able to complete a 3 minute walk test    Time  4    Period  Weeks    Status  On-going        PT Long Term Goals - 01/09/19 1920      PT LONG TERM GOAL #1   Title  The patient will be independent with safe self progression of HEP    Time  8    Period  Weeks    Status  New    Target Date  03/06/19      PT LONG TERM GOAL #2   Title  The patient will report a 50% improvement in right shoulder and hip pain with usual ADLs    Time  8    Period  Weeks    Status  New      PT LONG TERM GOAL #3   Title  FOTO functional outcome score for the hip decreased from 71% limitation to 46%    Time  8    Period  Weeks      PT LONG TERM GOAL #4   Title  Quick DASH outcome score improved from 47 to 37.    Time  8    Period  Weeks    Status  New      PT LONG TERM GOAL #5   Title  The patient will be able to ambulate 350 feet in 6 minutes    Time  8    Period  Weeks    Status  New      Additional Long Term Goals   Additional Long Term Goals  Yes      PT  LONG TERM GOAL #6   Title  The patient will able to lift a 1# object to an eye level shelf    Time  8    Period  Weeks    Status  New      PT LONG TERM GOAL #7   Title  Timed up and Go and 5x sit to stand tests  improved by 3 sec    Time  8    Period  Weeks    Status  New            Plan - 02/14/19 1105    Clinical Impression Statement  Pt  reports RT shoulder is 35%-40% improved. Mainly aching at night. Her hip pain she reports is 30% better. She reports longer walks ( to the elevator and back) is when it bothers her the most. Today pt was able to add 3# ankle weights for hip strengthening. Pt said there was no pain  but "it wore her out."  Pt reported she was not interested in the band for her shoulders as "I don't have anywhere to put it on." Applied third ionto patch to her Rt sacrum, the area of greatest area of complaint.    Personal Factors and Comorbidities  Age;Fitness;Past/Current Experience;Comorbidity 1;Comorbidity 2;Comorbidity 3+    Comorbidities  osteoporosis; lumbar fusion; right femur fracture; bil TKR; CVA with right weakness; Diabetes; HTN    Examination-Activity Limitations  Bathing;Locomotion Level;Reach Overhead;Carry;Stand;Dressing;Toileting;Self Feeding    Examination-Participation Restrictions  Community Activity;Driving;Shop;Meal Prep;Other    Stability/Clinical Decision Making  Evolving/Moderate complexity    Rehab Potential  Good    PT Frequency  2x / week    PT Duration  8 weeks    PT Treatment/Interventions  ADLs/Self Care Home Management;Cryotherapy;Electrical Stimulation;Aquatic Therapy;Moist Heat;Ultrasound;Therapeutic exercise;Neuromuscular re-education;Therapeutic activities;Functional mobility training;Patient/family education;Gait training;Manual techniques;Dry needling;Taping;Iontophoresis 4mg /ml Dexamethasone    PT Next Visit Plan  ionto #4  to area of complaint.;  manual therapy to gluteals and piriformis;  LE strengthening;  shoulder ROM;  low level shoulder strengthening (rows, extensions with band);  Nu-Step    PT Home Exercise Plan  Access Code: [email or web address]    Consulted and Agree with Plan of Care  Patient       Patient will benefit from skilled therapeutic intervention in order to improve the following deficits and impairments:  Difficulty walking, Decreased range of motion, Increased fascial  restricitons, Impaired UE functional use, Pain, Decreased activity tolerance, Decreased strength, Decreased mobility  Visit Diagnosis: Stiffness of right shoulder, not elsewhere classified  Acute pain of right shoulder  Pain in right hip  Muscle weakness (generalized)     Problem List Patient Active Problem List   Diagnosis Date Noted  . Iron deficiency anemia, unspecified 03/14/2018  . Chronic pain syndrome 11/04/2017  . Thoracic radiculopathy 11/04/2017  . Pes anserinus bursitis of right knee 08/12/2017  . GERD without esophagitis 05/12/2017  . Pleuritic chest pain 05/04/2017  . Medication monitoring encounter 05/04/2017  . Dyslipidemia associated with type 2 diabetes mellitus ([place]) 04/20/2017  . Hypertension associated with diabetes ([place]) 04/20/2017  . Cerebrovascular accident (CVA) with involvement of right side of body ([place]) 04/20/2017  . Peripheral autonomic neuropathy due to diabetes mellitus ([place]) 04/20/2017  . Hypertensive renal disease with renal failure, stage 1-4 or unspecified chronic kidney disease 04/20/2017  . Constipation 04/06/2017  . Status post lumbar spinal fusion 04/03/2017  . Recurrent UTI 04/03/2017  . Chronic renal insufficiency 04/03/2017  . Normocytic anemia 04/03/2017  . Osteomyelitis of thoracic  spine ([place]) 04/02/2017  . Thoracic discitis 04/02/2017  . DDD (degenerative disc disease), lumbar 12/31/2016  . Narcotic dependence ([place]) 10/24/2015  . Periprosthetic fracture around internal prosthetic right knee joint 04/25/2015  . Fracture, femur, distal, right, closed, initial encounter 04/24/2015  . Closed fracture of distal end of right femur, initial encounter ([place]) 04/24/2015  . Acute gout 10/17/2013  . Hypoparathyroidism ([place]) 03/02/2012  . Benign essential tremor 09/28/2010  . Combined hyperlipidemia associated with type 2 diabetes mellitus ([place]) 09/26/2009  . Osteopenia 09/26/2009  . Postoperative hypothyroidism 09/26/2009  . Multiple  pulmonary nodules 08/22/2009  . OSTEOARTHRITIS, KNEES, BILATERAL 08/07/2009  . THYROID CANCER, HX OF 02/10/2009  . Allergic rhinitis 11/19/2008  . Spinal stenosis of lumbar region 03/26/2008  . Hypothyroidism 07/03/2007  . Insulin dependent type 2 diabetes mellitus, controlled ([place]) 07/03/2007  . Spinal epidural abscess 07/03/2007  . Benign essential HTN 07/03/2007  . PSOAS MUSCLE ABSCESS 06/09/2007    Richey Doolittle, PTA 02/14/2019, 11:48 AM  [facility] [address] [place], [place], [address] Phone: [phone]   Fax:  [phone]  Name: ZARYAH SECKEL MRN: [id number] Date of Birth: Dec 08, 1940

## 2019-02-16 ENCOUNTER — Ambulatory Visit: Payer: Medicare Other | Admitting: Physical Therapy

## 2019-02-21 ENCOUNTER — Other Ambulatory Visit: Payer: Self-pay

## 2019-02-21 ENCOUNTER — Ambulatory Visit: Payer: Medicare Other | Admitting: Physical Therapy

## 2019-02-21 ENCOUNTER — Encounter: Payer: Self-pay | Admitting: Physical Therapy

## 2019-02-21 DIAGNOSIS — M25611 Stiffness of right shoulder, not elsewhere classified: Secondary | ICD-10-CM | POA: Diagnosis not present

## 2019-02-21 DIAGNOSIS — M25551 Pain in right hip: Secondary | ICD-10-CM

## 2019-02-21 DIAGNOSIS — M6281 Muscle weakness (generalized): Secondary | ICD-10-CM

## 2019-02-21 DIAGNOSIS — M25511 Pain in right shoulder: Secondary | ICD-10-CM

## 2019-02-21 NOTE — Therapy (Signed)
[facility] [address], [place], [place], [address] Phone: [phone]   Fax:  [phone]  Physical Therapy Treatment  Patient Details  Name: [name] MRN: [id number] Date of Birth: 05-19-1940 Referring Provider (PT): Dr. London Pepper   Encounter Date: 02/21/2019  PT End of Session - 02/21/19 1111    Visit Number  9    Date for PT Re-Evaluation  03/06/19    Authorization Type  [employer] Medicare; 10th visit progress note; KX at visit 15    PT Start Time  1106    PT Stop Time  1145    PT Time Calculation (min)  39 min    Activity Tolerance  Patient tolerated treatment well    Behavior During Therapy  [facility] for tasks assessed/performed       Past Medical History:  Diagnosis Date  . Anemia   . Asthma   . Chest pain, atypical   . Diabetes mellitus   . GERD (gastroesophageal reflux disease)   . History of transient ischemic attack (TIA)   . Hyperlipidemia   . Hypertension   . OA (osteoarthritis)   . Thyroid carcinoma [facility])     Past Surgical History:  Procedure Laterality Date  . BACK SURGERY    . BIOPSY  03/14/2018   Procedure: BIOPSY;  Surgeon: Wilford Corner, MD;  [facility];  Service: Endoscopy;;  EGD and Colon  . BREAST BIOPSY    . CARDIAC CATHETERIZATION  03/18/2009   NORMAL LEFT VENTRICULAR SIZE AND CONTRACTILITY WITH NORMAL SYSTOLIC  FUNCTION. EF 60%  . CARDIOLITE STUDY     SHOWED A SIGNIFICANT REVERSIBLE ANTERIOR WALL DEFECT CONSISTENT WITH ISCHEMIA. EF 55%  . COLONOSCOPY WITH PROPOFOL N/A 03/14/2018   Procedure: COLONOSCOPY WITH PROPOFOL;  Surgeon: Wilford Corner, MD;  [facility];  Service: Endoscopy;  Laterality: N/A;  . ESOPHAGOGASTRODUODENOSCOPY (EGD) WITH PROPOFOL N/A 03/14/2018   Procedure: ESOPHAGOGASTRODUODENOSCOPY (EGD) WITH PROPOFOL;  Surgeon: Wilford Corner, MD;  [facility];  Service: Endoscopy;  Laterality: N/A;  . FEMUR IM NAIL Right 04/25/2015    Procedure: INTRAMEDULLARY (IM) RIGHT  RETROGRADE FEMORAL NAILING;  Surgeon: Rod Can, MD;  [facility];  Service: Orthopedics;  Laterality: Right;  . IR FL GUIDED LOC OF NEEDLE/CATH TIP FOR SPINAL INJECTION RT  04/05/2017  . IR FLUORO GUIDE CV LINE RIGHT  04/08/2017  . IR US GUIDE VASC ACCESS RIGHT  04/08/2017  . LUMBAR FUSION    . POLYPECTOMY  03/14/2018   Procedure: POLYPECTOMY;  Surgeon: Wilford Corner, MD;  [facility];  Service: Endoscopy;;  . THYROIDECTOMY    . TOTAL KNEE ARTHROPLASTY     right    There were no vitals filed for this visit.  Subjective Assessment - 02/21/19 1112    Subjective  I have hip pain pain today, i think the weather has something to do with it,    Pertinent History  Bil TKR 2003 and 2010; 2017 right femur fracture with rod fixation;left rotator cuff surgery;CVA 2009 with right weakness; living in [facility];  back surgery 15 years ago with screw fixation with staph infection; used to do water aerobics 3x/week; caregive Tues/Thurs    Currently in Pain?  Yes    Pain Score  6     Pain Location  Hip    Pain Orientation  Right    Pain Descriptors / Indicators  Sore    Aggravating Factors   Walking    Pain Relieving  Factors  [place]    Multiple Pain Sites  No                       [facility] Adult PT Treatment/Exercise - 02/21/19 0001      Knee/Hip Exercises: Aerobic   Nustep  L2 10 min PTA present to monitor      Knee/Hip Exercises: Seated   Long Arc Quad  Strengthening;Both;1 set;15 reps;Weights    Long Arc Quad Weight  3 lbs.    Long Arc Quad Limitations  Pain lateral RT hip     Clamshell with TheraBand  Green   20x2   Marching  AROM;Both;1 set;15 reps;Weights    Marching Weights  3 lbs.    Sit to Sand  2 sets;5 reps;without UE support   Pt sitting on black pad     Shoulder Exercises: Seated   Other Seated Exercises  red band rows 10x    Other Seated Exercises  UE ranger 20x in each direction        Iontophoresis   Type of Iontophoresis  Dexamethasone   4#   Location  right buttock     Dose  1 ml    Time  6 hr wear patch                PT Short Term Goals - 02/06/19 1452      PT SHORT TERM GOAL #1   Title  The patient will demonstrate compliance with an initial HEP for right shoulder and hip ROM and strengthening    Status  Achieved      PT SHORT TERM GOAL #2   Title  The patient will report a 25% improvement in right shoulder pain with dressing, grooming and feeding    Status  Achieved      PT SHORT TERM GOAL #3   Title  Right shoulder flexion/scaption to 90 degrees needed meal prep, dressing, grooming    Status  Achieved      PT SHORT TERM GOAL #4   Title  Decreased right hip pain by 25% with ambulation short distances    Status  Achieved      PT SHORT TERM GOAL #5   Title  The patient will be able to complete a 3 minute walk test    Time  4    Period  Weeks    Status  On-going        PT Long Term Goals - 01/09/19 1920      PT LONG TERM GOAL #1   Title  The patient will be independent with safe self progression of HEP    Time  8    Period  Weeks    Status  New    Target Date  03/06/19      PT LONG TERM GOAL #2   Title  The patient will report a 50% improvement in right shoulder and hip pain with usual ADLs    Time  8    Period  Weeks    Status  New      PT LONG TERM GOAL #3   Title  FOTO functional outcome score for the hip decreased from 71% limitation to 46%    Time  8    Period  Weeks      PT LONG TERM GOAL #4   Title  Quick DASH outcome score improved from 47 to 37.    Time  8    Period  Weeks    Status  New      PT LONG TERM GOAL #5   Title  The patient will be able to ambulate 350 feet in 6 minutes    Time  8    Period  Weeks    Status  New      Additional Long Term Goals   Additional Long Term Goals  Yes      PT LONG TERM GOAL #6   Title  The patient will able to lift a 1# object to an eye level shelf    Time  8    Period   Weeks    Status  New      PT LONG TERM GOAL #7   Title  Timed up and Go and 5x sit to stand tests  improved by 3 sec    Time  8    Period  Weeks    Status  New            Plan - 02/21/19 1111    Clinical Impression Statement  Pt reports today her Rt hip is hurting more than it had been but she feels this is mainly due to the weather. Shoulder remains ALMOST pain free but some pain did present when she performed seated rows with PTA assistance. PTA kept her weights the same as pt felt it was "heavy enough." Today was 4th ionto patch applied more closely to her Sacrum per pt request.    Personal Factors and Comorbidities  Age;Fitness;Past/Current Experience;Comorbidity 1;Comorbidity 2;Comorbidity 3+    Comorbidities  osteoporosis; lumbar fusion; right femur fracture; bil TKR; CVA with right weakness; Diabetes; HTN    Examination-Activity Limitations  Bathing;Locomotion Level;Reach Overhead;Carry;Stand;Dressing;Toileting;Self Feeding    Examination-Participation Restrictions  Community Activity;Driving;Shop;Meal Prep;Other    Stability/Clinical Decision Making  Evolving/Moderate complexity    Rehab Potential  Good    PT Frequency  2x / week    PT Duration  8 weeks    PT Treatment/Interventions  ADLs/Self Care Home Management;Cryotherapy;Electrical Stimulation;Aquatic Therapy;Moist Heat;Ultrasound;Therapeutic exercise;Neuromuscular re-education;Therapeutic activities;Functional mobility training;Patient/family education;Gait training;Manual techniques;Dry needling;Taping;Iontophoresis 4mg /ml Dexamethasone    PT Next Visit Plan  ionto #5 to area of complaint.;  manual therapy to gluteals and piriformis;  LE strengthening;  shoulder ROM;  low level shoulder strengthening (rows, extensions with band);  Nu-Step    PT Home Exercise Plan  Access Code: [email or web address]    Consulted and Agree with Plan of Care  Patient       Patient will benefit from skilled therapeutic intervention in order to improve  the following deficits and impairments:  Difficulty walking, Decreased range of motion, Increased fascial restricitons, Impaired UE functional use, Pain, Decreased activity tolerance, Decreased strength, Decreased mobility  Visit Diagnosis: Stiffness of right shoulder, not elsewhere classified  Acute pain of right shoulder  Pain in right hip  Muscle weakness (generalized)     Problem List Patient Active Problem List   Diagnosis Date Noted  . Iron deficiency anemia, unspecified 03/14/2018  . Chronic pain syndrome 11/04/2017  . Thoracic radiculopathy 11/04/2017  . Pes anserinus bursitis of right knee 08/12/2017  . GERD without esophagitis 05/12/2017  . Pleuritic chest pain 05/04/2017  . Medication monitoring encounter 05/04/2017  . Dyslipidemia associated with type 2 diabetes mellitus ([place]) 04/20/2017  . Hypertension associated with diabetes ([place]) 04/20/2017  . Cerebrovascular accident (CVA) with involvement of right side of body ([place]) 04/20/2017  . Peripheral autonomic neuropathy due to diabetes mellitus ([place]) 04/20/2017  . Hypertensive renal disease  with renal failure, stage 1-4 or unspecified chronic kidney disease 04/20/2017  . Constipation 04/06/2017  . Status post lumbar spinal fusion 04/03/2017  . Recurrent UTI 04/03/2017  . Chronic renal insufficiency 04/03/2017  . Normocytic anemia 04/03/2017  . Osteomyelitis of thoracic spine ([place]) 04/02/2017  . Thoracic discitis 04/02/2017  . DDD (degenerative disc disease), lumbar 12/31/2016  . Narcotic dependence ([place]) 10/24/2015  . Periprosthetic fracture around internal prosthetic right knee joint 04/25/2015  . Fracture, femur, distal, right, closed, initial encounter 04/24/2015  . Closed fracture of distal end of right femur, initial encounter ([place]) 04/24/2015  . Acute gout 10/17/2013  . Hypoparathyroidism ([place]) 03/02/2012  . Benign essential tremor 09/28/2010  . Combined hyperlipidemia associated with type 2 diabetes  mellitus ([place]) 09/26/2009  . Osteopenia 09/26/2009  . Postoperative hypothyroidism 09/26/2009  . Multiple pulmonary nodules 08/22/2009  . OSTEOARTHRITIS, KNEES, BILATERAL 08/07/2009  . THYROID CANCER, HX OF 02/10/2009  . Allergic rhinitis 11/19/2008  . Spinal stenosis of lumbar region 03/26/2008  . Hypothyroidism 07/03/2007  . Insulin dependent type 2 diabetes mellitus, controlled ([place]) 07/03/2007  . Spinal epidural abscess 07/03/2007  . Benign essential HTN 07/03/2007  . PSOAS MUSCLE ABSCESS 06/09/2007    Andrzej Scully, PTA 02/21/2019, 11:40 AM  [facility] [address], [place], [place], [address] Phone: [phone]   Fax:  [phone]  Name: SKYLARR LIZ MRN: [id number] Date of Birth: 05-13-1940

## 2019-02-23 ENCOUNTER — Ambulatory Visit: Payer: Medicare Other | Admitting: Physical Therapy

## 2019-02-26 ENCOUNTER — Other Ambulatory Visit: Payer: Self-pay

## 2019-02-26 ENCOUNTER — Encounter: Payer: Self-pay | Admitting: Physical Therapy

## 2019-02-26 ENCOUNTER — Ambulatory Visit: Payer: Medicare Other | Admitting: Physical Therapy

## 2019-02-26 DIAGNOSIS — M25511 Pain in right shoulder: Secondary | ICD-10-CM

## 2019-02-26 DIAGNOSIS — M25611 Stiffness of right shoulder, not elsewhere classified: Secondary | ICD-10-CM | POA: Diagnosis not present

## 2019-02-26 DIAGNOSIS — M6281 Muscle weakness (generalized): Secondary | ICD-10-CM

## 2019-02-26 DIAGNOSIS — M25551 Pain in right hip: Secondary | ICD-10-CM

## 2019-02-26 NOTE — Therapy (Addendum)
[facility] [address] [place], [place], [address] Phone: [phone]   Fax:  [phone]  Physical Therapy Treatment  Patient Details  Name: [name] MRN: [id number] Date of Birth: March 29, 1940 Referring Provider (PT): Dr. London Pepper  Progress Note Reporting Period 01/09/2019 to 02/26/2019  See note below for Objective Data and Assessment of Progress/Goals.      Encounter Date: 02/26/2019  PT End of Session - 02/26/19 1104    Visit Number  10    Date for PT Re-Evaluation  03/06/19    Authorization Type  [employer] Medicare; 10th visit progress note; KX at visit 15    PT Start Time  1104    PT Stop Time  1205    PT Time Calculation (min)  61 min    Activity Tolerance  Patient tolerated treatment well;Patient limited by pain    Behavior During Therapy  [facility] for tasks assessed/performed       Past Medical History:  Diagnosis Date  . Anemia   . Asthma   . Chest pain, atypical   . Diabetes mellitus   . GERD (gastroesophageal reflux disease)   . History of transient ischemic attack (TIA)   . Hyperlipidemia   . Hypertension   . OA (osteoarthritis)   . Thyroid carcinoma [facility])     Past Surgical History:  Procedure Laterality Date  . BACK SURGERY    . BIOPSY  03/14/2018   Procedure: BIOPSY;  Surgeon: Wilford Corner, MD;  [facility];  Service: Endoscopy;;  EGD and Colon  . BREAST BIOPSY    . CARDIAC CATHETERIZATION  03/18/2009   NORMAL LEFT VENTRICULAR SIZE AND CONTRACTILITY WITH NORMAL SYSTOLIC  FUNCTION. EF 60%  . CARDIOLITE STUDY     SHOWED A SIGNIFICANT REVERSIBLE ANTERIOR WALL DEFECT CONSISTENT WITH ISCHEMIA. EF 55%  . COLONOSCOPY WITH PROPOFOL N/A 03/14/2018   Procedure: COLONOSCOPY WITH PROPOFOL;  Surgeon: Wilford Corner, MD;  [facility];  Service: Endoscopy;  Laterality: N/A;  . ESOPHAGOGASTRODUODENOSCOPY (EGD) WITH PROPOFOL N/A 03/14/2018   Procedure:  ESOPHAGOGASTRODUODENOSCOPY (EGD) WITH PROPOFOL;  Surgeon: Wilford Corner, MD;  [facility];  Service: Endoscopy;  Laterality: N/A;  . FEMUR IM NAIL Right 04/25/2015   Procedure: INTRAMEDULLARY (IM) RIGHT  RETROGRADE FEMORAL NAILING;  Surgeon: Rod Can, MD;  [facility];  Service: Orthopedics;  Laterality: Right;  . IR FL GUIDED LOC OF NEEDLE/CATH TIP FOR SPINAL INJECTION RT  04/05/2017  . IR FLUORO GUIDE CV LINE RIGHT  04/08/2017  . IR US GUIDE VASC ACCESS RIGHT  04/08/2017  . LUMBAR FUSION    . POLYPECTOMY  03/14/2018   Procedure: POLYPECTOMY;  Surgeon: Wilford Corner, MD;  [facility];  Service: Endoscopy;;  . THYROIDECTOMY    . TOTAL KNEE ARTHROPLASTY     right    There were no vitals filed for this visit.  Subjective Assessment - 02/26/19 1114    Subjective  Not a great day for me; the weather is really affecting me this week ( the rain and cold). I know the therpay is helping because my pain was different when I first came. I think I am 50%-60% improved since my first day.    Pertinent History  Bil TKR 2003 and 2010; 2017 right femur fracture with rod fixation;left rotator cuff surgery;CVA 2009 with right weakness; living in [facility];  back surgery 15 years ago with screw fixation with staph infection; used to do water aerobics 3x/week; caregive  Tues/Thurs    Currently in Pain?  Yes   hip and shoulder today are deeply achey and sore today. "It's my arthritis."   Pain Score  6     Pain Orientation  Right    Aggravating Factors   The weather is getting my arthritis fired up.    Pain Relieving Factors  The patches really help and my other exercises.         [facility] PT Assessment - 02/26/19 0001      Assessment   Medical Diagnosis  right shoulder pain and hip pain     Referring Provider (PT)  Dr. London Pepper      Observation/Other Assessments   Focus on Therapeutic Outcomes (FOTO)   56% limited    Quick DASH   29      6 minute walk  test results    Aerobic Endurance Distance Walked  75   pt could only walk 3 min before hip and shoulder pain      Timed Up and Go Test   Normal TUG (seconds)  37   with RW                  [facility] Adult PT Treatment/Exercise - 02/26/19 0001      Knee/Hip Exercises: Aerobic   Nustep  L2 10 min PTA present to monitor   PTA admin FOTO concurrent     Shoulder Exercises: Seated   Other Seated Exercises  UE ranger 20x in each direction    PTA administered Quick DASH concurrent     Shoulder Exercises: Isometric Strengthening   Flexion  5X10"    Extension  5X10"    External Rotation  5X10"      Iontophoresis   Type of Iontophoresis  Dexamethasone   #5 for back/hip, #3 for shoulder   Location  right buttock close to sacrum, and  superiolateral  RT shoulder     Dose  1 ml    Time  6 hr wear patch                PT Short Term Goals - 02/26/19 1152      PT SHORT TERM GOAL #1   Title  The patient will demonstrate compliance with an initial HEP for right shoulder and hip ROM and strengthening    Time  4    Period  Weeks    Status  Achieved      PT SHORT TERM GOAL #2   Title  The patient will report a 25% improvement in right shoulder pain with dressing, grooming and feeding    Time  4    Period  Weeks    Status  Achieved      PT SHORT TERM GOAL #3   Title  Right shoulder flexion/scaption to 90 degrees needed meal prep, dressing, grooming    Time  4    Period  Weeks    Status  Achieved      PT SHORT TERM GOAL #4   Title  Decreased right hip pain by 25% with ambulation short distances    Time  4    Period  Weeks    Status  Achieved      PT SHORT TERM GOAL #5   Title  The patient will be able to complete a 3 minute walk test    Time  4    Period  Weeks    Status  Achieved   completed 3 min, 75 feet  today       PT Long Term Goals - 02/26/19 1119      PT LONG TERM GOAL #3   Title  FOTO functional outcome score for the hip decreased from 71%  limitation to 46%    Time  8    Period  Weeks    Status  On-going   56% limitation     PT LONG TERM GOAL #4   Title  Quick DASH outcome score improved from 47 to 37.    Time  8    Period  Weeks    Status  Achieved   29     PT LONG TERM GOAL #6   Title  The patient will able to lift a 1# object to an eye level shelf    Time  8    Period  Weeks    Status  Achieved      PT LONG TERM GOAL #7   Title  Timed up and Go and 5x sit to stand tests  improved by 3 sec    Time  8    Period  Weeks    Status  On-going   37 sec           Plan - 02/26/19 1208    Clinical Impression Statement  Today pt presents with self reported increased "arthritic pain" due to the weather over the last week. She does report this pain is different than when she first came to [place] and the original pain is 50%-60% improved in both her shoulder and hip. Her DASH score was greatly improved as was her FOTO. Timed up and Go was not improved despite very good effort and pain was limiting during the 6 min walk test which pt could only complete 3 min. It should be mentioned the walk test was administered at the end of her session and this may have impacted her ability. Pt demonstrated putting a 1# weight on a shelf at eye level with no pain. pt tolerated shoulder isometrics fairly well. More fatigue than anything else.    Personal Factors and Comorbidities  Age;Fitness;Past/Current Experience;Comorbidity 1;Comorbidity 2;Comorbidity 3+    Comorbidities  osteoporosis; lumbar fusion; right femur fracture; bil TKR; CVA with right weakness; Diabetes; HTN    Examination-Activity Limitations  Bathing;Locomotion Level;Reach Overhead;Carry;Stand;Dressing;Toileting;Self Feeding    Examination-Participation Restrictions  Community Activity;Driving;Shop;Meal Prep;Other    Stability/Clinical Decision Making  Evolving/Moderate complexity    Rehab Potential  Good    PT Frequency  2x / week    PT Duration  8 weeks    PT  Treatment/Interventions  ADLs/Self Care Home Management;Cryotherapy;Electrical Stimulation;Aquatic Therapy;Moist Heat;Ultrasound;Therapeutic exercise;Neuromuscular re-education;Therapeutic activities;Functional mobility training;Patient/family education;Gait training;Manual techniques;Dry needling;Taping;Iontophoresis 4mg /ml Dexamethasone    PT Next Visit Plan  Continue with Rt shoulder and RT hip strength as tolerated.    PT Home Exercise Plan  Access Code: [email or web address]    Consulted and Agree with Plan of Care  Patient       Patient will benefit from skilled therapeutic intervention in order to improve the following deficits and impairments:  Difficulty walking, Decreased range of motion, Increased fascial restricitons, Impaired UE functional use, Pain, Decreased activity tolerance, Decreased strength, Decreased mobility  Visit Diagnosis: Stiffness of right shoulder, not elsewhere classified  Acute pain of right shoulder  Pain in right hip  Muscle weakness (generalized)     Problem List Patient Active Problem List   Diagnosis Date Noted  . Iron deficiency anemia, unspecified 03/14/2018  . Chronic pain syndrome  11/04/2017  . Thoracic radiculopathy 11/04/2017  . Pes anserinus bursitis of right knee 08/12/2017  . GERD without esophagitis 05/12/2017  . Pleuritic chest pain 05/04/2017  . Medication monitoring encounter 05/04/2017  . Dyslipidemia associated with type 2 diabetes mellitus ([place]) 04/20/2017  . Hypertension associated with diabetes ([place]) 04/20/2017  . Cerebrovascular accident (CVA) with involvement of right side of body ([place]) 04/20/2017  . Peripheral autonomic neuropathy due to diabetes mellitus ([place]) 04/20/2017  . Hypertensive renal disease with renal failure, stage 1-4 or unspecified chronic kidney disease 04/20/2017  . Constipation 04/06/2017  . Status post lumbar spinal fusion 04/03/2017  . Recurrent UTI 04/03/2017  . Chronic renal insufficiency 04/03/2017  . Normocytic  anemia 04/03/2017  . Osteomyelitis of thoracic spine ([place]) 04/02/2017  . Thoracic discitis 04/02/2017  . DDD (degenerative disc disease), lumbar 12/31/2016  . Narcotic dependence ([place]) 10/24/2015  . Periprosthetic fracture around internal prosthetic right knee joint 04/25/2015  . Fracture, femur, distal, right, closed, initial encounter 04/24/2015  . Closed fracture of distal end of right femur, initial encounter ([place]) 04/24/2015  . Acute gout 10/17/2013  . Hypoparathyroidism ([place]) 03/02/2012  . Benign essential tremor 09/28/2010  . Combined hyperlipidemia associated with type 2 diabetes mellitus ([place]) 09/26/2009  . Osteopenia 09/26/2009  . Postoperative hypothyroidism 09/26/2009  . Multiple pulmonary nodules 08/22/2009  . OSTEOARTHRITIS, KNEES, BILATERAL 08/07/2009  . THYROID CANCER, HX OF 02/10/2009  . Allergic rhinitis 11/19/2008  . Spinal stenosis of lumbar region 03/26/2008  . Hypothyroidism 07/03/2007  . Insulin dependent type 2 diabetes mellitus, controlled ([place]) 07/03/2007  . Spinal epidural abscess 07/03/2007  . Benign essential HTN 07/03/2007  . PSOAS MUSCLE ABSCESS 06/09/2007   Myrene Galas, PTA 02/26/19 12:20 PM Ruben Im, PT 02/26/19 5:59 PM Phone: [phone] Fax: [facility] [address], [place], [place], [address] Phone: [phone]   Fax:  [phone]  Name: [name] MRN: [id number] Date of Birth: 1940/12/18

## 2019-02-28 ENCOUNTER — Ambulatory Visit: Payer: Medicare Other | Admitting: Physical Therapy

## 2019-02-28 ENCOUNTER — Encounter: Payer: Self-pay | Admitting: Physical Therapy

## 2019-02-28 ENCOUNTER — Other Ambulatory Visit: Payer: Self-pay

## 2019-02-28 DIAGNOSIS — M25551 Pain in right hip: Secondary | ICD-10-CM

## 2019-02-28 DIAGNOSIS — M25611 Stiffness of right shoulder, not elsewhere classified: Secondary | ICD-10-CM

## 2019-02-28 DIAGNOSIS — M6281 Muscle weakness (generalized): Secondary | ICD-10-CM

## 2019-02-28 DIAGNOSIS — M25511 Pain in right shoulder: Secondary | ICD-10-CM

## 2019-02-28 NOTE — Therapy (Signed)
[facility] [address], [place], [place], [address] Phone: [phone]   Fax:  [phone]  Physical Therapy Treatment  Patient Details  Name: [name] MRN: [id number] Date of Birth: 06/20/1940 Referring Provider (PT): Dr. London Pepper   Encounter Date: 02/28/2019  PT End of Session - 02/28/19 1104    Visit Number  11    Date for PT Re-Evaluation  03/06/19    Authorization Type  UHC Medicare; 10th visit progress note; KX at visit 15    PT Start Time  1104    PT Stop Time  1145    PT Time Calculation (min)  41 min    Activity Tolerance  Patient tolerated treatment well    Behavior During Therapy  [facility] for tasks assessed/performed       Past Medical History:  Diagnosis Date  . Anemia   . Asthma   . Chest pain, atypical   . Diabetes mellitus   . GERD (gastroesophageal reflux disease)   . History of transient ischemic attack (TIA)   . Hyperlipidemia   . Hypertension   . OA (osteoarthritis)   . Thyroid carcinoma [facility])     Past Surgical History:  Procedure Laterality Date  . BACK SURGERY    . BIOPSY  03/14/2018   Procedure: BIOPSY;  Surgeon: Wilford Corner, MD;  [facility];  Service: Endoscopy;;  EGD and Colon  . BREAST BIOPSY    . CARDIAC CATHETERIZATION  03/18/2009   NORMAL LEFT VENTRICULAR SIZE AND CONTRACTILITY WITH NORMAL SYSTOLIC  FUNCTION. EF 60%  . CARDIOLITE STUDY     SHOWED A SIGNIFICANT REVERSIBLE ANTERIOR WALL DEFECT CONSISTENT WITH ISCHEMIA. EF 55%  . COLONOSCOPY WITH PROPOFOL N/A 03/14/2018   Procedure: COLONOSCOPY WITH PROPOFOL;  Surgeon: Wilford Corner, MD;  [facility];  Service: Endoscopy;  Laterality: N/A;  . ESOPHAGOGASTRODUODENOSCOPY (EGD) WITH PROPOFOL N/A 03/14/2018   Procedure: ESOPHAGOGASTRODUODENOSCOPY (EGD) WITH PROPOFOL;  Surgeon: Wilford Corner, MD;  [facility];  Service: Endoscopy;  Laterality: N/A;  . FEMUR IM NAIL Right 04/25/2015    Procedure: INTRAMEDULLARY (IM) RIGHT  RETROGRADE FEMORAL NAILING;  Surgeon: Rod Can, MD;  [facility];  Service: Orthopedics;  Laterality: Right;  . IR FL GUIDED LOC OF NEEDLE/CATH TIP FOR SPINAL INJECTION RT  04/05/2017  . IR FLUORO GUIDE CV LINE RIGHT  04/08/2017  . IR US GUIDE VASC ACCESS RIGHT  04/08/2017  . LUMBAR FUSION    . POLYPECTOMY  03/14/2018   Procedure: POLYPECTOMY;  Surgeon: Wilford Corner, MD;  [facility];  Service: Endoscopy;;  . THYROIDECTOMY    . TOTAL KNEE ARTHROPLASTY     right    There were no vitals filed for this visit.  Subjective Assessment - 02/28/19 1106    Subjective  I did feel some better once the weather straightened out.    Pertinent History  Bil TKR 2003 and 2010; 2017 right femur fracture with rod fixation;left rotator cuff surgery;CVA 2009 with right weakness; living in [facility];  back surgery 15 years ago with screw fixation with staph infection; used to do water aerobics 3x/week; caregive Tues/Thurs    Limitations  Walking;House hold activities    Currently in Pain?  Yes    Pain Score  6     Pain Location  Hip    Pain Orientation  Right    Pain Descriptors / Indicators  Sore    Multiple Pain Sites  No  [facility] Adult PT Treatment/Exercise - 02/28/19 0001      Knee/Hip Exercises: Aerobic   Nustep  L3 x 10 min with PTA present      Knee/Hip Exercises: Seated   Long Arc Quad  Strengthening;1 set;10 reps;Weights    Long Arc Quad Weight  3 lbs.    Clamshell with TheraBand  Blue   3x10   Marching  AROM;Both;1 set;15 reps;Weights    Marching Weights  3 lbs.      Shoulder Exercises: Seated   Flexion  AROM;Right;10 reps   Elbow bent to straight   Other Seated Exercises  Bicep curl 2# 10x       Shoulder Exercises: Isometric Strengthening   Flexion  5X10"    Extension  5X10"    External Rotation  5X10"      Iontophoresis   Type of Iontophoresis  Dexamethasone   #6 for  back/hip, #4 for shoulder   Location  right buttock close to sacrum, and  superiolateral  RT shoulder     Dose  1 ml    Time  6 hr wear patch                PT Short Term Goals - 02/26/19 1152      PT SHORT TERM GOAL #1   Title  The patient will demonstrate compliance with an initial HEP for right shoulder and hip ROM and strengthening    Time  4    Period  Weeks    Status  Achieved      PT SHORT TERM GOAL #2   Title  The patient will report a 25% improvement in right shoulder pain with dressing, grooming and feeding    Time  4    Period  Weeks    Status  Achieved      PT SHORT TERM GOAL #3   Title  Right shoulder flexion/scaption to 90 degrees needed meal prep, dressing, grooming    Time  4    Period  Weeks    Status  Achieved      PT SHORT TERM GOAL #4   Title  Decreased right hip pain by 25% with ambulation short distances    Time  4    Period  Weeks    Status  Achieved      PT SHORT TERM GOAL #5   Title  The patient will be able to complete a 3 minute walk test    Time  4    Period  Weeks    Status  Achieved   completed 3 min, 75 feet today       PT Long Term Goals - 02/26/19 1119      PT LONG TERM GOAL #3   Title  FOTO functional outcome score for the hip decreased from 71% limitation to 46%    Time  8    Period  Weeks    Status  On-going   56% limitation     PT LONG TERM GOAL #4   Title  Quick DASH outcome score improved from 47 to 37.    Time  8    Period  Weeks    Status  Achieved   29     PT LONG TERM GOAL #6   Title  The patient will able to lift a 1# object to an eye level shelf    Time  8    Period  Weeks    Status  Achieved  PT LONG TERM GOAL #7   Title  Timed up and Go and 5x sit to stand tests  improved by 3 sec    Time  8    Period  Weeks    Status  On-going   37 sec           Plan - 02/28/19 1104    Clinical Impression Statement  Pt reports she feels better since the weather has changed over the last two  days. Pt was able to return to LE strengthening exercises with light resisatnce in addition to progressing to some Rt shoulder AROM. Shoulder exercises were difficult due to weakness not pain. Today was pt's last ionto patch to her hip/back. She can do one more to her shoulder but patches are back ordered at his time.    Personal Factors and Comorbidities  Age;Fitness;Past/Current Experience;Comorbidity 1;Comorbidity 2;Comorbidity 3+    Comorbidities  osteoporosis; lumbar fusion; right femur fracture; bil TKR; CVA with right weakness; Diabetes; HTN    Examination-Activity Limitations  Bathing;Locomotion Level;Reach Overhead;Carry;Stand;Dressing;Toileting;Self Feeding    Examination-Participation Restrictions  Community Activity;Driving;Shop;Meal Prep;Other    Stability/Clinical Decision Making  Evolving/Moderate complexity    Rehab Potential  Good    PT Frequency  2x / week    PT Duration  8 weeks    PT Treatment/Interventions  ADLs/Self Care Home Management;Cryotherapy;Electrical Stimulation;Aquatic Therapy;Moist Heat;Ultrasound;Therapeutic exercise;Neuromuscular re-education;Therapeutic activities;Functional mobility training;Patient/family education;Gait training;Manual techniques;Dry needling;Taping;Iontophoresis 4mg /ml Dexamethasone    PT Next Visit Plan  Continue with Rt shoulder and RT hip strength as tolerated.    PT Home Exercise Plan  Access Code: [email or web address]    Consulted and Agree with Plan of Care  Patient       Patient will benefit from skilled therapeutic intervention in order to improve the following deficits and impairments:  Difficulty walking, Decreased range of motion, Increased fascial restricitons, Impaired UE functional use, Pain, Decreased activity tolerance, Decreased strength, Decreased mobility  Visit Diagnosis: Stiffness of right shoulder, not elsewhere classified  Acute pain of right shoulder  Pain in right hip  Muscle weakness (generalized)     Problem  List Patient Active Problem List   Diagnosis Date Noted  . Iron deficiency anemia, unspecified 03/14/2018  . Chronic pain syndrome 11/04/2017  . Thoracic radiculopathy 11/04/2017  . Pes anserinus bursitis of right knee 08/12/2017  . GERD without esophagitis 05/12/2017  . Pleuritic chest pain 05/04/2017  . Medication monitoring encounter 05/04/2017  . Dyslipidemia associated with type 2 diabetes mellitus ([place]) 04/20/2017  . Hypertension associated with diabetes ([place]) 04/20/2017  . Cerebrovascular accident (CVA) with involvement of right side of body ([place]) 04/20/2017  . Peripheral autonomic neuropathy due to diabetes mellitus ([place]) 04/20/2017  . Hypertensive renal disease with renal failure, stage 1-4 or unspecified chronic kidney disease 04/20/2017  . Constipation 04/06/2017  . Status post lumbar spinal fusion 04/03/2017  . Recurrent UTI 04/03/2017  . Chronic renal insufficiency 04/03/2017  . Normocytic anemia 04/03/2017  . Osteomyelitis of thoracic spine ([place]) 04/02/2017  . Thoracic discitis 04/02/2017  . DDD (degenerative disc disease), lumbar 12/31/2016  . Narcotic dependence ([place]) 10/24/2015  . Periprosthetic fracture around internal prosthetic right knee joint 04/25/2015  . Fracture, femur, distal, right, closed, initial encounter 04/24/2015  . Closed fracture of distal end of right femur, initial encounter ([place]) 04/24/2015  . Acute gout 10/17/2013  . Hypoparathyroidism ([place]) 03/02/2012  . Benign essential tremor 09/28/2010  . Combined hyperlipidemia associated with type 2 diabetes mellitus ([place]) 09/26/2009  . Osteopenia 09/26/2009  .  Postoperative hypothyroidism 09/26/2009  . Multiple pulmonary nodules 08/22/2009  . OSTEOARTHRITIS, KNEES, BILATERAL 08/07/2009  . THYROID CANCER, HX OF 02/10/2009  . Allergic rhinitis 11/19/2008  . Spinal stenosis of lumbar region 03/26/2008  . Hypothyroidism 07/03/2007  . Insulin dependent type 2 diabetes mellitus, controlled ([place])  07/03/2007  . Spinal epidural abscess 07/03/2007  . Benign essential HTN 07/03/2007  . PSOAS MUSCLE ABSCESS 06/09/2007    Milan Perkins, PTA 02/28/2019, 5:05 PM  [facility] [address] [place], [place], [address] Phone: [phone]   Fax:  [phone]  Name: [name] MRN: [id number] Date of Birth: 10-Mar-1940

## 2019-03-02 ENCOUNTER — Ambulatory Visit: Payer: Medicare Other | Admitting: Podiatry

## 2019-03-07 ENCOUNTER — Ambulatory Visit: Payer: Medicare Other | Attending: Family Medicine | Admitting: Physical Therapy

## 2019-03-07 ENCOUNTER — Encounter: Payer: Self-pay | Admitting: Physical Therapy

## 2019-03-07 ENCOUNTER — Other Ambulatory Visit: Payer: Self-pay

## 2019-03-07 DIAGNOSIS — M25551 Pain in right hip: Secondary | ICD-10-CM | POA: Insufficient documentation

## 2019-03-07 DIAGNOSIS — M6281 Muscle weakness (generalized): Secondary | ICD-10-CM | POA: Insufficient documentation

## 2019-03-07 DIAGNOSIS — M25511 Pain in right shoulder: Secondary | ICD-10-CM | POA: Diagnosis present

## 2019-03-07 DIAGNOSIS — M25611 Stiffness of right shoulder, not elsewhere classified: Secondary | ICD-10-CM | POA: Diagnosis present

## 2019-03-07 NOTE — Therapy (Signed)
[facility] [address] [place], [place], [address] Phone: [phone]   Fax:  [phone]  Physical Therapy Treatment  Patient Details  Name: [name] MRN: [id number] Date of Birth: 1940/08/04 Referring Provider (PT): Dr. London Pepper   Encounter Date: 03/07/2019  PT End of Session - 03/07/19 1103    Visit Number  12    Date for PT Re-Evaluation  03/06/19    Authorization Type  UHC Medicare; 10th visit progress note; KX at visit 15    PT Start Time  1102    PT Stop Time  1150    PT Time Calculation (min)  48 min    Activity Tolerance  Patient tolerated treatment well    Behavior During Therapy  [facility] for tasks assessed/performed       Past Medical History:  Diagnosis Date  . Anemia   . Asthma   . Chest pain, atypical   . Diabetes mellitus   . GERD (gastroesophageal reflux disease)   . History of transient ischemic attack (TIA)   . Hyperlipidemia   . Hypertension   . OA (osteoarthritis)   . Thyroid carcinoma [facility])     Past Surgical History:  Procedure Laterality Date  . BACK SURGERY    . BIOPSY  03/14/2018   Procedure: BIOPSY;  Surgeon: Wilford Corner, MD;  [facility];  Service: Endoscopy;;  EGD and Colon  . BREAST BIOPSY    . CARDIAC CATHETERIZATION  03/18/2009   NORMAL LEFT VENTRICULAR SIZE AND CONTRACTILITY WITH NORMAL SYSTOLIC  FUNCTION. EF 60%  . CARDIOLITE STUDY     SHOWED A SIGNIFICANT REVERSIBLE ANTERIOR WALL DEFECT CONSISTENT WITH ISCHEMIA. EF 55%  . COLONOSCOPY WITH PROPOFOL N/A 03/14/2018   Procedure: COLONOSCOPY WITH PROPOFOL;  Surgeon: Wilford Corner, MD;  [facility];  Service: Endoscopy;  Laterality: N/A;  . ESOPHAGOGASTRODUODENOSCOPY (EGD) WITH PROPOFOL N/A 03/14/2018   Procedure: ESOPHAGOGASTRODUODENOSCOPY (EGD) WITH PROPOFOL;  Surgeon: Wilford Corner, MD;  [facility];  Service: Endoscopy;  Laterality: N/A;  . FEMUR IM NAIL Right 04/25/2015    Procedure: INTRAMEDULLARY (IM) RIGHT  RETROGRADE FEMORAL NAILING;  Surgeon: Rod Can, MD;  [facility];  Service: Orthopedics;  Laterality: Right;  . IR FL GUIDED LOC OF NEEDLE/CATH TIP FOR SPINAL INJECTION RT  04/05/2017  . IR FLUORO GUIDE CV LINE RIGHT  04/08/2017  . IR US GUIDE VASC ACCESS RIGHT  04/08/2017  . LUMBAR FUSION    . POLYPECTOMY  03/14/2018   Procedure: POLYPECTOMY;  Surgeon: Wilford Corner, MD;  [facility];  Service: Endoscopy;;  . THYROIDECTOMY    . TOTAL KNEE ARTHROPLASTY     right    There were no vitals filed for this visit.  Subjective Assessment - 03/07/19 1105    Subjective  Got my vaccine on Monday so my shoulder is sore.    Pertinent History  Bil TKR 2003 and 2010; 2017 right femur fracture with rod fixation;left rotator cuff surgery;CVA 2009 with right weakness; living in [facility];  back surgery 15 years ago with screw fixation with staph infection; used to do water aerobics 3x/week; caregive Tues/Thurs    Currently in Pain?  Yes    Pain Score  5    Arm sore from the shot   Pain Orientation  Right    Pain Descriptors / Indicators  Sore    Aggravating Factors   When the weather is not good    Pain Relieving Factors  I love the patches and teh Nustep    Multiple Pain Sites  No                       [facility] Adult PT Treatment/Exercise - 03/07/19 0001      Knee/Hip Exercises: Aerobic   Nustep  L3 x 10 min with PTA present    Other Aerobic  Arm bike L1 x 2:30 each way       Knee/Hip Exercises: Standing   Other Standing Knee Exercises  Walking 160 feet ( 2 laps ) with RW and no stopping.       Knee/Hip Exercises: Seated   Long Arc Quad  Strengthening;Both;1 set;15 reps;Weights    Long Arc Quad Weight  3 lbs.    Clamshell with TheraBand  Blue   15x2   Marching  AROM;Both;1 set;15 reps;Weights    Marching Weights  3 lbs.      Moist Heat Therapy   Number Minutes Moist Heat  10 Minutes    Moist Heat  Location  Hip   RT post session     Iontophoresis   Type of Iontophoresis  Dexamethasone   #5 for RT shoulder   Location  RT shoulder    Dose   1 ml    Time  6 hr wear patch                PT Short Term Goals - 02/26/19 1152      PT SHORT TERM GOAL #1   Title  The patient will demonstrate compliance with an initial HEP for right shoulder and hip ROM and strengthening    Time  4    Period  Weeks    Status  Achieved      PT SHORT TERM GOAL #2   Title  The patient will report a 25% improvement in right shoulder pain with dressing, grooming and feeding    Time  4    Period  Weeks    Status  Achieved      PT SHORT TERM GOAL #3   Title  Right shoulder flexion/scaption to 90 degrees needed meal prep, dressing, grooming    Time  4    Period  Weeks    Status  Achieved      PT SHORT TERM GOAL #4   Title  Decreased right hip pain by 25% with ambulation short distances    Time  4    Period  Weeks    Status  Achieved      PT SHORT TERM GOAL #5   Title  The patient will be able to complete a 3 minute walk test    Time  4    Period  Weeks    Status  Achieved   completed 3 min, 75 feet today       PT Long Term Goals - 03/07/19 1119      PT LONG TERM GOAL #2   Title  The patient will report a 50% improvement in right shoulder and hip pain with usual ADLs    Time  8    Period  Weeks    Status  On-going   RT shoulder 40%, hip 30%           Plan - 03/07/19 1103    Clinical Impression Statement  Pt full of energy today, all mobility ( UE or LE) was faster and no complaints of pain. Pt even ambulated 160 feet today in  the clinic without stopping with her RW. Pt requested trying the arm bike today which she completed 5 minutes.    Personal Factors and Comorbidities  Age;Fitness;Past/Current Experience;Comorbidity 1;Comorbidity 2;Comorbidity 3+    Comorbidities  osteoporosis; lumbar fusion; right femur fracture; bil TKR; CVA with right weakness; Diabetes; HTN     Examination-Activity Limitations  Bathing;Locomotion Level;Reach Overhead;Carry;Stand;Dressing;Toileting;Self Feeding    Examination-Participation Restrictions  Community Activity;Driving;Shop;Meal Prep;Other    Stability/Clinical Decision Making  Evolving/Moderate complexity    Rehab Potential  Good    PT Frequency  2x / week    PT Duration  8 weeks    PT Treatment/Interventions  ADLs/Self Care Home Management;Cryotherapy;Electrical Stimulation;Aquatic Therapy;Moist Heat;Ultrasound;Therapeutic exercise;Neuromuscular re-education;Therapeutic activities;Functional mobility training;Patient/family education;Gait training;Manual techniques;Dry needling;Taping;Iontophoresis 4mg /ml Dexamethasone    PT Next Visit Plan  Continue with Rt shoulder and RT hip strength as tolerated.    PT Home Exercise Plan  Access Code: [email or web address]    Consulted and Agree with Plan of Care  --       Patient will benefit from skilled therapeutic intervention in order to improve the following deficits and impairments:  Difficulty walking, Decreased range of motion, Increased fascial restricitons, Impaired UE functional use, Pain, Decreased activity tolerance, Decreased strength, Decreased mobility  Visit Diagnosis: Stiffness of right shoulder, not elsewhere classified  Acute pain of right shoulder  Pain in right hip  Muscle weakness (generalized)     Problem List Patient Active Problem List   Diagnosis Date Noted  . Iron deficiency anemia, unspecified 03/14/2018  . Chronic pain syndrome 11/04/2017  . Thoracic radiculopathy 11/04/2017  . Pes anserinus bursitis of right knee 08/12/2017  . GERD without esophagitis 05/12/2017  . Pleuritic chest pain 05/04/2017  . Medication monitoring encounter 05/04/2017  . Dyslipidemia associated with type 2 diabetes mellitus ([place]) 04/20/2017  . Hypertension associated with diabetes ([place]) 04/20/2017  . Cerebrovascular accident (CVA) with involvement of right side of body ([place])  04/20/2017  . Peripheral autonomic neuropathy due to diabetes mellitus ([place]) 04/20/2017  . Hypertensive renal disease with renal failure, stage 1-4 or unspecified chronic kidney disease 04/20/2017  . Constipation 04/06/2017  . Status post lumbar spinal fusion 04/03/2017  . Recurrent UTI 04/03/2017  . Chronic renal insufficiency 04/03/2017  . Normocytic anemia 04/03/2017  . Osteomyelitis of thoracic spine ([place]) 04/02/2017  . Thoracic discitis 04/02/2017  . DDD (degenerative disc disease), lumbar 12/31/2016  . Narcotic dependence ([place]) 10/24/2015  . Periprosthetic fracture around internal prosthetic right knee joint 04/25/2015  . Fracture, femur, distal, right, closed, initial encounter 04/24/2015  . Closed fracture of distal end of right femur, initial encounter ([place]) 04/24/2015  . Acute gout 10/17/2013  . Hypoparathyroidism ([place]) 03/02/2012  . Benign essential tremor 09/28/2010  . Combined hyperlipidemia associated with type 2 diabetes mellitus ([place]) 09/26/2009  . Osteopenia 09/26/2009  . Postoperative hypothyroidism 09/26/2009  . Multiple pulmonary nodules 08/22/2009  . OSTEOARTHRITIS, KNEES, BILATERAL 08/07/2009  . THYROID CANCER, HX OF 02/10/2009  . Allergic rhinitis 11/19/2008  . Spinal stenosis of lumbar region 03/26/2008  . Hypothyroidism 07/03/2007  . Insulin dependent type 2 diabetes mellitus, controlled ([place]) 07/03/2007  . Spinal epidural abscess 07/03/2007  . Benign essential HTN 07/03/2007  . PSOAS MUSCLE ABSCESS 06/09/2007    Alaiah Lundy, PTA 03/07/2019, 11:41 AM  [facility] [address], [place], [place], [address] Phone: [phone]   Fax:  [phone]  Name: [name] MRN: [id number] Date of Birth: 01-17-1940

## 2019-03-09 ENCOUNTER — Ambulatory Visit: Payer: Medicare Other | Admitting: Physical Therapy

## 2019-03-20 ENCOUNTER — Ambulatory Visit: Payer: Medicare Other | Admitting: Physical Therapy

## 2019-04-10 ENCOUNTER — Ambulatory Visit: Payer: Medicare Other | Attending: Family Medicine | Admitting: Physical Therapy

## 2019-04-10 ENCOUNTER — Other Ambulatory Visit: Payer: Self-pay

## 2019-04-10 DIAGNOSIS — M25551 Pain in right hip: Secondary | ICD-10-CM | POA: Insufficient documentation

## 2019-04-10 DIAGNOSIS — M25611 Stiffness of right shoulder, not elsewhere classified: Secondary | ICD-10-CM | POA: Insufficient documentation

## 2019-04-10 DIAGNOSIS — M25511 Pain in right shoulder: Secondary | ICD-10-CM | POA: Insufficient documentation

## 2019-04-10 DIAGNOSIS — M6281 Muscle weakness (generalized): Secondary | ICD-10-CM | POA: Diagnosis present

## 2019-04-10 NOTE — Therapy (Signed)
[facility] [address], [place], [place], [address] Phone: [phone]   Fax:  [phone]  Physical Therapy Treatment/Recertification/Discharge Summary   Patient Details  Name: [name] MRN: [id number] Date of Birth: 01-17-1940 Referring Provider (PT): Dr. London Pepper   Encounter Date: 04/10/2019  PT End of Session - 04/10/19 1141    Visit Number  13    Date for PT Re-Evaluation  03/07/2019    Authorization Type  UHC Medicare; 10th visit progress note; KX at visit 15    PT Start Time  1101    PT Stop Time  1145    PT Time Calculation (min)  44 min    Activity Tolerance  Patient limited by pain       Past Medical History:  Diagnosis Date  . Anemia   . Asthma   . Chest pain, atypical   . Diabetes mellitus   . GERD (gastroesophageal reflux disease)   . History of transient ischemic attack (TIA)   . Hyperlipidemia   . Hypertension   . OA (osteoarthritis)   . Thyroid carcinoma [facility])     Past Surgical History:  Procedure Laterality Date  . BACK SURGERY    . BIOPSY  03/14/2018   Procedure: BIOPSY;  Surgeon: Wilford Corner, MD;  [facility];  Service: Endoscopy;;  EGD and Colon  . BREAST BIOPSY    . CARDIAC CATHETERIZATION  03/18/2009   NORMAL LEFT VENTRICULAR SIZE AND CONTRACTILITY WITH NORMAL SYSTOLIC  FUNCTION. EF 60%  . CARDIOLITE STUDY     SHOWED A SIGNIFICANT REVERSIBLE ANTERIOR WALL DEFECT CONSISTENT WITH ISCHEMIA. EF 55%  . COLONOSCOPY WITH PROPOFOL N/A 03/14/2018   Procedure: COLONOSCOPY WITH PROPOFOL;  Surgeon: Wilford Corner, MD;  [facility];  Service: Endoscopy;  Laterality: N/A;  . ESOPHAGOGASTRODUODENOSCOPY (EGD) WITH PROPOFOL N/A 03/14/2018   Procedure: ESOPHAGOGASTRODUODENOSCOPY (EGD) WITH PROPOFOL;  Surgeon: Wilford Corner, MD;  [facility];  Service: Endoscopy;  Laterality: N/A;  . FEMUR IM NAIL Right 04/25/2015   Procedure: INTRAMEDULLARY (IM)  RIGHT  RETROGRADE FEMORAL NAILING;  Surgeon: Rod Can, MD;  [facility];  Service: Orthopedics;  Laterality: Right;  . IR FL GUIDED LOC OF NEEDLE/CATH TIP FOR SPINAL INJECTION RT  04/05/2017  . IR FLUORO GUIDE CV LINE RIGHT  04/08/2017  . IR US GUIDE VASC ACCESS RIGHT  04/08/2017  . LUMBAR FUSION    . POLYPECTOMY  03/14/2018   Procedure: POLYPECTOMY;  Surgeon: Wilford Corner, MD;  [facility];  Service: Endoscopy;;  . THYROIDECTOMY    . TOTAL KNEE ARTHROPLASTY     right    There were no vitals filed for this visit.  Subjective Assessment - 04/10/19 1109    Subjective  My shoulder is not getting better.  It hurts at nighttime.  My hip is fine.  I think my shoulder needs a MRI.    Pertinent History  Bil TKR 2003 and 2010; 2017 right femur fracture with rod fixation;left rotator cuff surgery;CVA 2009 with right weakness; living in [facility] senior [facility];  back surgery 15 years ago with screw fixation with staph infection; used to do water aerobics 3x/week; caregive Tues/Thurs    How long can you walk comfortably?  I can walk [place]    Currently in Pain?  Yes    Pain Score  7    1010 at night time   Pain Location  Shoulder    Pain Orientation  Right    Pain Score  0    Pain Location  Hip         [facility] PT Assessment - 04/10/19 0001      Assessment   Medical Diagnosis  right shoulder pain and hip pain     Referring Provider (PT)  Dr. London Pepper      Observation/Other Assessments   Focus on Therapeutic Outcomes (FOTO)   50% limitation       AROM   Right Shoulder Flexion  136 Degrees    Right Shoulder ABduction  120 Degrees    Right Shoulder Internal Rotation  --   L1   Right Shoulder External Rotation  50 Degrees      Strength   Right Shoulder Flexion  3+/5    Right Shoulder ABduction  3+/5    Right Shoulder Internal Rotation  3+/5    Right Shoulder External Rotation  3+/5    Right Hip Flexion  4-/5    Right Hip ABduction   4-/5      Standardized Balance Assessment   Five times sit to stand comments   23.29 sec with UE assist       Timed Up and Go Test   Normal TUG (seconds)  23.2   with RW                   [facility] Adult PT Treatment/Exercise - 04/10/19 0001      Ambulation/Gait   Gait Comments  gait with RW      Knee/Hip Exercises: Aerobic   Nustep  L3 x 15 min while discussing status       Knee/Hip Exercises: Seated   Sit to Sand  5 reps      Shoulder Exercises: Seated   Other Seated Exercises  ROM right shoulder       Moist Heat Therapy   Number Minutes Moist Heat  5 Minutes    Moist Heat Location  Shoulder               PT Short Term Goals - 04/10/19 2013      PT SHORT TERM GOAL #1   Title  The patient will demonstrate compliance with an initial HEP for right shoulder and hip ROM and strengthening    Status  Achieved      PT SHORT TERM GOAL #2   Title  The patient will report a 25% improvement in right shoulder pain with dressing, grooming and feeding    Status  Achieved      PT SHORT TERM GOAL #3   Title  Right shoulder flexion/scaption to 90 degrees needed meal prep, dressing, grooming    Status  Achieved      PT SHORT TERM GOAL #4   Title  Decreased right hip pain by 25% with ambulation short distances    Status  Achieved      PT SHORT TERM GOAL #5   Title  The patient will be able to complete a 3 minute walk test    Status  Partially Met        PT Long Term Goals - 04/10/19 1123      PT LONG TERM GOAL #1   Title  The patient will be independent with safe self progression of HEP    Status  Achieved      PT LONG TERM GOAL #2   Title  The patient will report a 50% improvement in right shoulder and hip pain with  usual ADLs    Status  Partially Met      PT LONG TERM GOAL #3   Title  FOTO functional outcome score for the hip decreased from 71% limitation to 46%    Status  Partially Met      PT LONG TERM GOAL #4   Title  Quick DASH outcome score  improved from 47 to 37.    Status  Partially Met      PT LONG TERM GOAL #5   Title  The patient will be able to ambulate 350 feet in 6 minutes    Status  Partially Met      PT LONG TERM GOAL #6   Title  The patient will able to lift a 1# object to an eye level shelf    Status  Achieved      PT LONG TERM GOAL #7   Title  Timed up and Go and 5x sit to stand tests  improved by 3 sec    Status  Achieved            Plan - 04/10/19 2005    Clinical Impression Statement  The patient expresses frustration secondary to the return of shoulder pain particularly at night.  She plans to follow up with the doctor and possibly have an MRI. Her shoulder ROM has improved signficantly in all planes of motion since her initial assessment in January but lacks full ROM and strength.  She reports her hip has not bothered her in 1 week.  Her Timed up and Go time and 5x sit to stand have also signficantly improved.  She  walking tplerance improved to 220 feet with RW (some hip discomfort but general fatigue and shoulder pain are also  factors.  She requests discharge from PT at this time to follow up with the MD regarding her shoulder. Partial goals met.   She plans on returning to ex at the [facility] as well.    Comorbidities  osteoporosis; lumbar fusion; right femur fracture; bil TKR; CVA with right weakness; Diabetes; HTN    Rehab Potential  Good    PT Frequency  2x / week    PT Duration  8 weeks    PT Treatment/Interventions  ADLs/Self Care Home Management;Cryotherapy;Electrical Stimulation;Aquatic Therapy;Moist Heat;Ultrasound;Therapeutic exercise;Neuromuscular re-education;Therapeutic activities;Functional mobility training;Patient/family education;Gait training;Manual techniques;Dry needling;Taping;Iontophoresis 40m/ml Dexamethasone    PT Home Exercise Plan  Access Code: [email or web address]       Patient will benefit from skilled therapeutic intervention in order to improve the following deficits and  impairments:  Difficulty walking, Decreased range of motion, Increased fascial restricitons, Impaired UE functional use, Pain, Decreased activity tolerance, Decreased strength, Decreased mobility  Visit Diagnosis: Stiffness of right shoulder, not elsewhere classified - Plan: PT plan of care cert/re-cert  Acute pain of right shoulder - Plan: PT plan of care cert/re-cert  Pain in right hip - Plan: PT plan of care cert/re-cert  Muscle weakness (generalized) - Plan: PT plan of care cert/re-cert     Problem List Patient Active Problem List   Diagnosis Date Noted  . Iron deficiency anemia, unspecified 03/14/2018  . Chronic pain syndrome 11/04/2017  . Thoracic radiculopathy 11/04/2017  . Pes anserinus bursitis of right knee 08/12/2017  . GERD without esophagitis 05/12/2017  . Pleuritic chest pain 05/04/2017  . Medication monitoring encounter 05/04/2017  . Dyslipidemia associated with type 2 diabetes mellitus ([place] 04/20/2017  . Hypertension associated with diabetes ([place] 04/20/2017  . Cerebrovascular accident (CVA) with involvement of right  side of body ([place]) 04/20/2017  . Peripheral autonomic neuropathy due to diabetes mellitus ([place]) 04/20/2017  . Hypertensive renal disease with renal failure, stage 1-4 or unspecified chronic kidney disease 04/20/2017  . Constipation 04/06/2017  . Status post lumbar spinal fusion 04/03/2017  . Recurrent UTI 04/03/2017  . Chronic renal insufficiency 04/03/2017  . Normocytic anemia 04/03/2017  . Osteomyelitis of thoracic spine ([place]) 04/02/2017  . Thoracic discitis 04/02/2017  . DDD (degenerative disc disease), lumbar 12/31/2016  . Narcotic dependence ([place]) 10/24/2015  . Periprosthetic fracture around internal prosthetic right knee joint 04/25/2015  . Fracture, femur, distal, right, closed, initial encounter 04/24/2015  . Closed fracture of distal end of right femur, initial encounter ([place]) 04/24/2015  . Acute gout 10/17/2013  . Hypoparathyroidism  ([place]) 03/02/2012  . Benign essential tremor 09/28/2010  . Combined hyperlipidemia associated with type 2 diabetes mellitus ([place]) 09/26/2009  . Osteopenia 09/26/2009  . Postoperative hypothyroidism 09/26/2009  . Multiple pulmonary nodules 08/22/2009  . OSTEOARTHRITIS, KNEES, BILATERAL 08/07/2009  . THYROID CANCER, HX OF 02/10/2009  . Allergic rhinitis 11/19/2008  . Spinal stenosis of lumbar region 03/26/2008  . Hypothyroidism 07/03/2007  . Insulin dependent type 2 diabetes mellitus, controlled ([place]) 07/03/2007  . Spinal epidural abscess 07/03/2007  . Benign essential HTN 07/03/2007  . PSOAS MUSCLE ABSCESS 06/09/2007   Ruben Im, PT 04/10/19 8:18 PM Phone: [phone] Fax: [phone] Alvera Singh 04/10/2019, 8:17 PM   [facility] [address] [place], [place], [address] Phone: [phone]   Fax:  [phone]  Name: AMZIE SILLAS MRN: [id number] Date of Birth: May 16, 1940

## 2019-04-13 ENCOUNTER — Ambulatory Visit: Payer: Medicare Other | Admitting: Podiatry

## 2019-04-13 ENCOUNTER — Other Ambulatory Visit: Payer: Self-pay

## 2019-04-13 ENCOUNTER — Encounter: Payer: Self-pay | Admitting: Podiatry

## 2019-04-13 ENCOUNTER — Ambulatory Visit (INDEPENDENT_AMBULATORY_CARE_PROVIDER_SITE_OTHER): Payer: Medicare Other | Admitting: Orthotics

## 2019-04-13 DIAGNOSIS — B351 Tinea unguium: Secondary | ICD-10-CM | POA: Diagnosis not present

## 2019-04-13 DIAGNOSIS — M2010 Hallux valgus (acquired), unspecified foot: Secondary | ICD-10-CM

## 2019-04-13 DIAGNOSIS — E1142 Type 2 diabetes mellitus with diabetic polyneuropathy: Secondary | ICD-10-CM

## 2019-04-13 DIAGNOSIS — M79675 Pain in left toe(s): Secondary | ICD-10-CM

## 2019-04-13 DIAGNOSIS — L84 Corns and callosities: Secondary | ICD-10-CM

## 2019-04-13 DIAGNOSIS — M79674 Pain in right toe(s): Secondary | ICD-10-CM | POA: Diagnosis not present

## 2019-04-13 NOTE — Progress Notes (Signed)

## 2019-04-16 NOTE — Progress Notes (Signed)
Subjective: [name] presents today for follow up of preventative diabetic foot care and callus(es) left hallux nailbed and painful mycotic toenails b/l that are difficult to trim. Pain interferes with ambulation. Aggravating factors include wearing enclosed shoe gear. Pain is relieved with periodic professional debridement.   She states she was hospitalized in December and missed her appointment.  Allergies  Allergen Reactions  . Pioglitazone Swelling  . Actos [Pioglitazone Hydrochloride] Swelling  . Ativan [Lorazepam]     swelling  . Cephalexin Other (See Comments)    Doesn't remember   . Oxycodone     This medication makes her feel sick  . Rosiglitazone     unknown  . Tramadol Other (See Comments)    hallucinations     Objective: There were no vitals filed for this visit.  Pt is a pleasant 79 y.o. year old African American female  in NAD. AAO x 3.   Vascular Examination:  Capillary refill time to digits immediate b/l. Palpable DP pulses b/l. Palpable PT pulses b/l. Pedal hair absent b/l Skin temperature gradient within normal limits b/l.  Dermatological Examination: Pedal skin with normal turgor, texture and tone bilaterally. No open wounds bilaterally. No interdigital macerations bilaterally. Toenails 2-5 bilaterally elongated, dystrophic, thickened, and crumbly with subungual debris and tenderness to dorsal palpation. Anonychia noted L hallux and R hallux. Nailbed(s) epithelialized.  Hyperkeratotic lesion(s) dorsal nailbed left hallux.  No erythema, no edema, no drainage, no flocculence.  Musculoskeletal: Normal muscle strength 5/5 to all lower extremity muscle groups bilaterally, no gross bony deformities bilaterally and no pain crepitus or joint limitation noted with ROM b/l  Neurological: Protective sensation diminished with 10g monofilament b/l. Vibratory sensation decreased b/l.  Assessment: 1. Pain due to onychomycosis of toenails of both feet   2. Callus    3. Diabetic peripheral neuropathy associated with type 2 diabetes mellitus ([place])    Plan: -Continue diabetic foot care principles. -Toenails L 2nd toe, L 3rd toe, L 4th toe, L 5th toe, R 2nd toe, R 3rd toe, R 4th toe and R 5th toe debrided in length and girth without iatrogenic bleeding with sterile nail nipper and dremel.  -Dispensed digital toe caps for b/l great toes for daily protection. -Callus(es) L hallux were debrided without complication or incident. Total number debrided =1. -Patient to continue soft, supportive shoe gear daily. -Patient to report any pedal injuries to medical professional immediately. -Patient/POA to call should there be question/concern in the interim.  Return in about 3 months (around 07/13/2019).

## 2019-04-27 ENCOUNTER — Telehealth: Payer: Self-pay | Admitting: Podiatry

## 2019-04-27 NOTE — Telephone Encounter (Signed)
Pt called left a message stating that her diabetic shoes are hurting her toes..   I returned call and scheduled pt to see [name] on 5.4.2021 and made sure to tell pt to do not wear the shoes.

## 2019-05-08 ENCOUNTER — Other Ambulatory Visit: Payer: Self-pay

## 2019-05-08 ENCOUNTER — Ambulatory Visit: Payer: Medicare Other | Admitting: Orthotics

## 2019-05-08 DIAGNOSIS — M79675 Pain in left toe(s): Secondary | ICD-10-CM

## 2019-05-08 DIAGNOSIS — B351 Tinea unguium: Secondary | ICD-10-CM

## 2019-05-08 DIAGNOSIS — L84 Corns and callosities: Secondary | ICD-10-CM

## 2019-05-08 DIAGNOSIS — E1142 Type 2 diabetes mellitus with diabetic polyneuropathy: Secondary | ICD-10-CM

## 2019-05-08 NOTE — Progress Notes (Signed)
Returning shoes for different style.

## 2019-06-01 ENCOUNTER — Ambulatory Visit: Payer: Medicare Other | Admitting: Orthotics

## 2019-06-01 ENCOUNTER — Other Ambulatory Visit: Payer: Self-pay

## 2019-06-26 DIAGNOSIS — M25511 Pain in right shoulder: Secondary | ICD-10-CM | POA: Insufficient documentation

## 2019-07-06 ENCOUNTER — Ambulatory Visit: Payer: Medicare Other | Admitting: Podiatry

## 2019-09-21 ENCOUNTER — Encounter: Payer: Self-pay | Admitting: Podiatry

## 2019-09-21 ENCOUNTER — Ambulatory Visit (INDEPENDENT_AMBULATORY_CARE_PROVIDER_SITE_OTHER): Payer: Medicare Other | Admitting: Podiatry

## 2019-09-21 ENCOUNTER — Other Ambulatory Visit: Payer: Self-pay

## 2019-09-21 DIAGNOSIS — L84 Corns and callosities: Secondary | ICD-10-CM

## 2019-09-21 DIAGNOSIS — M79675 Pain in left toe(s): Secondary | ICD-10-CM | POA: Diagnosis not present

## 2019-09-21 DIAGNOSIS — B351 Tinea unguium: Secondary | ICD-10-CM

## 2019-09-21 DIAGNOSIS — E1142 Type 2 diabetes mellitus with diabetic polyneuropathy: Secondary | ICD-10-CM | POA: Diagnosis not present

## 2019-09-21 DIAGNOSIS — M79674 Pain in right toe(s): Secondary | ICD-10-CM | POA: Diagnosis not present

## 2019-09-23 NOTE — Progress Notes (Signed)
Subjective: [name] presents today for follow up of preventative diabetic foot care and callus(es) left hallux nailbed and painful mycotic toenails b/l that are difficult to trim. Pain interferes with ambulation. Aggravating factors include wearing enclosed shoe gear. Pain is relieved with periodic professional debridement.   She states her toenails are painful. She is unaware she   Allergies  Allergen Reactions  . Pioglitazone Swelling  . Actos [Pioglitazone Hydrochloride] Swelling  . Ativan [Lorazepam]     swelling  . Cephalexin Other (See Comments)    Doesn't remember   . Oxycodone     This medication makes her feel sick  . Rosiglitazone     unknown  . Tramadol Other (See Comments)    hallucinations     Objective: There were no vitals filed for this visit.  Pt is a pleasant 79 y.o. year old African American female  in NAD. AAO x 3.   Vascular Examination:  Capillary refill time to digits immediate b/l. Palpable DP pulses b/l. Palpable PT pulses b/l. Pedal hair absent b/l Skin temperature gradient within normal limits b/l.  Dermatological Examination: Pedal skin with normal turgor, texture and tone bilaterally. No open wounds bilaterally. No interdigital macerations bilaterally. Toenails 2-5 bilaterally elongated, dystrophic, thickened, and crumbly with subungual debris and tenderness to dorsal palpation. Anonychia noted L hallux and R hallux. Nailbed(s) epithelialized.  Hyperkeratotic lesion(s) dorsal nailbed left hallux.  No erythema, no edema, no drainage, no flocculence.  Musculoskeletal: Normal muscle strength 5/5 to all lower extremity muscle groups bilaterally, no gross bony deformities bilaterally and no pain crepitus or joint limitation noted with ROM b/l  Neurological: Protective sensation diminished with 10g monofilament b/l. Vibratory sensation decreased b/l.  Assessment: 1. Pain due to onychomycosis of toenails of both feet   2. Callus   3. Diabetic  peripheral neuropathy associated with type 2 diabetes mellitus ([place])     Plan: -Continue diabetic foot care principles. -Toenails L 2nd toe, L 3rd toe, L 4th toe, L 5th toe, R 2nd toe, R 3rd toe, R 4th toe and R 5th toe debrided in length and girth without iatrogenic bleeding with sterile nail nipper and dremel.  -Dispensed digital toe caps for b/l great toes for daily protection. -Callus(es) L hallux were debrided without complication or incident. Total number debrided =1. -Patient to continue soft, supportive shoe gear daily. -Patient to report any pedal injuries to medical professional immediately. -Patient/POA to call should there be question/concern in the interim.  Return in about 3 months (around 12/21/2019).

## 2019-12-21 ENCOUNTER — Ambulatory Visit: Payer: Medicare Other | Admitting: Podiatry

## 2020-01-08 DIAGNOSIS — N1832 Chronic kidney disease, stage 3b: Secondary | ICD-10-CM | POA: Diagnosis not present

## 2020-01-08 DIAGNOSIS — R21 Rash and other nonspecific skin eruption: Secondary | ICD-10-CM | POA: Diagnosis not present

## 2020-01-08 DIAGNOSIS — I10 Essential (primary) hypertension: Secondary | ICD-10-CM | POA: Diagnosis not present

## 2020-02-01 DIAGNOSIS — E7800 Pure hypercholesterolemia, unspecified: Secondary | ICD-10-CM | POA: Diagnosis not present

## 2020-02-01 DIAGNOSIS — N1832 Chronic kidney disease, stage 3b: Secondary | ICD-10-CM | POA: Diagnosis not present

## 2020-02-01 DIAGNOSIS — I69328 Other speech and language deficits following cerebral infarction: Secondary | ICD-10-CM | POA: Diagnosis not present

## 2020-02-01 DIAGNOSIS — E1169 Type 2 diabetes mellitus with other specified complication: Secondary | ICD-10-CM | POA: Diagnosis not present

## 2020-02-01 DIAGNOSIS — H16223 Keratoconjunctivitis sicca, not specified as Sjogren's, bilateral: Secondary | ICD-10-CM | POA: Diagnosis not present

## 2020-02-01 DIAGNOSIS — M8580 Other specified disorders of bone density and structure, unspecified site: Secondary | ICD-10-CM | POA: Diagnosis not present

## 2020-02-01 DIAGNOSIS — H04123 Dry eye syndrome of bilateral lacrimal glands: Secondary | ICD-10-CM | POA: Diagnosis not present

## 2020-02-01 DIAGNOSIS — E890 Postprocedural hypothyroidism: Secondary | ICD-10-CM | POA: Diagnosis not present

## 2020-02-01 DIAGNOSIS — Z79899 Other long term (current) drug therapy: Secondary | ICD-10-CM | POA: Diagnosis not present

## 2020-02-01 DIAGNOSIS — Z0001 Encounter for general adult medical examination with abnormal findings: Secondary | ICD-10-CM | POA: Diagnosis not present

## 2020-02-01 DIAGNOSIS — I10 Essential (primary) hypertension: Secondary | ICD-10-CM | POA: Diagnosis not present

## 2020-02-01 DIAGNOSIS — Z794 Long term (current) use of insulin: Secondary | ICD-10-CM | POA: Diagnosis not present

## 2020-02-01 DIAGNOSIS — M5414 Radiculopathy, thoracic region: Secondary | ICD-10-CM | POA: Diagnosis not present

## 2020-02-01 DIAGNOSIS — K219 Gastro-esophageal reflux disease without esophagitis: Secondary | ICD-10-CM | POA: Diagnosis not present

## 2020-02-14 DIAGNOSIS — E1165 Type 2 diabetes mellitus with hyperglycemia: Secondary | ICD-10-CM | POA: Diagnosis not present

## 2020-02-14 DIAGNOSIS — C73 Malignant neoplasm of thyroid gland: Secondary | ICD-10-CM | POA: Diagnosis not present

## 2020-02-14 DIAGNOSIS — E892 Postprocedural hypoparathyroidism: Secondary | ICD-10-CM | POA: Diagnosis not present

## 2020-02-14 DIAGNOSIS — E890 Postprocedural hypothyroidism: Secondary | ICD-10-CM | POA: Diagnosis not present

## 2020-02-15 DIAGNOSIS — E1165 Type 2 diabetes mellitus with hyperglycemia: Secondary | ICD-10-CM | POA: Diagnosis not present

## 2020-02-15 DIAGNOSIS — E892 Postprocedural hypoparathyroidism: Secondary | ICD-10-CM | POA: Diagnosis not present

## 2020-02-15 DIAGNOSIS — I10 Essential (primary) hypertension: Secondary | ICD-10-CM | POA: Diagnosis not present

## 2020-02-15 DIAGNOSIS — N189 Chronic kidney disease, unspecified: Secondary | ICD-10-CM | POA: Diagnosis not present

## 2020-02-15 DIAGNOSIS — E890 Postprocedural hypothyroidism: Secondary | ICD-10-CM | POA: Diagnosis not present

## 2020-02-15 DIAGNOSIS — C73 Malignant neoplasm of thyroid gland: Secondary | ICD-10-CM | POA: Diagnosis not present

## 2020-02-18 ENCOUNTER — Other Ambulatory Visit: Payer: Self-pay | Admitting: Endocrinology

## 2020-02-18 DIAGNOSIS — C73 Malignant neoplasm of thyroid gland: Secondary | ICD-10-CM

## 2020-02-26 ENCOUNTER — Ambulatory Visit: Payer: Medicare Other | Admitting: Podiatry

## 2020-02-27 ENCOUNTER — Other Ambulatory Visit: Payer: Self-pay

## 2020-02-27 ENCOUNTER — Ambulatory Visit
Admission: RE | Admit: 2020-02-27 | Discharge: 2020-02-27 | Disposition: A | Discharge status: Home / Self Care | Payer: Medicare Other | Source: Ambulatory Visit | Attending: Endocrinology | Admitting: Endocrinology

## 2020-02-27 DIAGNOSIS — C73 Malignant neoplasm of thyroid gland: Secondary | ICD-10-CM | POA: Diagnosis not present

## 2020-02-27 DIAGNOSIS — E890 Postprocedural hypothyroidism: Secondary | ICD-10-CM | POA: Diagnosis not present

## 2020-03-03 ENCOUNTER — Ambulatory Visit (INDEPENDENT_AMBULATORY_CARE_PROVIDER_SITE_OTHER): Payer: Medicare Other | Admitting: Podiatry

## 2020-03-03 ENCOUNTER — Other Ambulatory Visit: Payer: Self-pay

## 2020-03-03 ENCOUNTER — Encounter: Payer: Self-pay | Admitting: Podiatry

## 2020-03-03 DIAGNOSIS — M79675 Pain in left toe(s): Secondary | ICD-10-CM

## 2020-03-03 DIAGNOSIS — M79674 Pain in right toe(s): Secondary | ICD-10-CM | POA: Diagnosis not present

## 2020-03-03 DIAGNOSIS — B351 Tinea unguium: Secondary | ICD-10-CM | POA: Diagnosis not present

## 2020-03-03 NOTE — Progress Notes (Signed)
Subjective:   Patient ID: [name], female   DOB: 80 y.o.   MRN: [id number]   HPI Patient presents with elongated nailbeds 1-5 both feet that she cannot cut and they are thick and painful with shoe gear   ROS      Objective:  Physical Exam  Neurovascular status diminished with thick yellow brittle nailbeds 1-5 both feet that are painful when pressed and make shoe gear very difficult     Assessment:  Chronic mycotic nail infection with pain 1-5 both feet     Plan:  Debride painful nailbeds 1-5 both feet no iatrogenic bleeding reappoint routine care

## 2020-03-06 DIAGNOSIS — M25511 Pain in right shoulder: Secondary | ICD-10-CM | POA: Diagnosis not present

## 2020-03-13 ENCOUNTER — Ambulatory Visit: Payer: Medicare Other | Admitting: Podiatry

## 2020-03-27 DIAGNOSIS — M25511 Pain in right shoulder: Secondary | ICD-10-CM | POA: Diagnosis not present

## 2020-04-15 DIAGNOSIS — E890 Postprocedural hypothyroidism: Secondary | ICD-10-CM | POA: Diagnosis not present

## 2020-04-15 DIAGNOSIS — C73 Malignant neoplasm of thyroid gland: Secondary | ICD-10-CM | POA: Diagnosis not present

## 2020-05-05 DIAGNOSIS — R0981 Nasal congestion: Secondary | ICD-10-CM | POA: Diagnosis not present

## 2020-05-05 DIAGNOSIS — R059 Cough, unspecified: Secondary | ICD-10-CM | POA: Diagnosis not present

## 2020-05-05 DIAGNOSIS — Z03818 Encounter for observation for suspected exposure to other biological agents ruled out: Secondary | ICD-10-CM | POA: Diagnosis not present

## 2020-05-09 DIAGNOSIS — M25511 Pain in right shoulder: Secondary | ICD-10-CM | POA: Diagnosis not present

## 2020-05-12 DIAGNOSIS — R059 Cough, unspecified: Secondary | ICD-10-CM | POA: Diagnosis not present

## 2020-05-12 DIAGNOSIS — J209 Acute bronchitis, unspecified: Secondary | ICD-10-CM | POA: Diagnosis not present

## 2020-05-22 DIAGNOSIS — M25511 Pain in right shoulder: Secondary | ICD-10-CM | POA: Diagnosis not present

## 2020-05-28 DIAGNOSIS — R059 Cough, unspecified: Secondary | ICD-10-CM | POA: Diagnosis not present

## 2020-05-28 DIAGNOSIS — R062 Wheezing: Secondary | ICD-10-CM | POA: Diagnosis not present

## 2020-05-28 DIAGNOSIS — J209 Acute bronchitis, unspecified: Secondary | ICD-10-CM | POA: Diagnosis not present

## 2020-06-13 ENCOUNTER — Ambulatory Visit (INDEPENDENT_AMBULATORY_CARE_PROVIDER_SITE_OTHER): Payer: Medicare Other | Admitting: Podiatry

## 2020-06-13 ENCOUNTER — Encounter: Payer: Self-pay | Admitting: Podiatry

## 2020-06-13 ENCOUNTER — Other Ambulatory Visit: Payer: Self-pay

## 2020-06-13 DIAGNOSIS — L84 Corns and callosities: Secondary | ICD-10-CM | POA: Diagnosis not present

## 2020-06-13 DIAGNOSIS — E7800 Pure hypercholesterolemia, unspecified: Secondary | ICD-10-CM | POA: Insufficient documentation

## 2020-06-13 DIAGNOSIS — M79675 Pain in left toe(s): Secondary | ICD-10-CM

## 2020-06-13 DIAGNOSIS — M810 Age-related osteoporosis without current pathological fracture: Secondary | ICD-10-CM | POA: Insufficient documentation

## 2020-06-13 DIAGNOSIS — Z794 Long term (current) use of insulin: Secondary | ICD-10-CM | POA: Insufficient documentation

## 2020-06-13 DIAGNOSIS — M79674 Pain in right toe(s): Secondary | ICD-10-CM

## 2020-06-13 DIAGNOSIS — B351 Tinea unguium: Secondary | ICD-10-CM | POA: Diagnosis not present

## 2020-06-13 DIAGNOSIS — E1142 Type 2 diabetes mellitus with diabetic polyneuropathy: Secondary | ICD-10-CM | POA: Diagnosis not present

## 2020-06-13 DIAGNOSIS — I69328 Other speech and language deficits following cerebral infarction: Secondary | ICD-10-CM | POA: Insufficient documentation

## 2020-06-13 DIAGNOSIS — R1084 Generalized abdominal pain: Secondary | ICD-10-CM | POA: Insufficient documentation

## 2020-06-18 NOTE — Progress Notes (Signed)
  Subjective:  Patient ID: [name], female    DOB: November 30, 1940,  MRN: [id number]  80 y.o. female presents with at risk foot care with history of diabetic neuropathy and callus(es) left hallux and painful thick toenails that are difficult to trim. Painful toenails interfere with ambulation. Aggravating factors include wearing enclosed shoe gear. Pain is relieved with periodic professional debridement. Painful calluses are aggravated when weightbearing with and without shoegear. Pain is relieved with periodic professional debridement..    Patient's blood sugar was 179 mg/dl on yesterday. Patient did not check blood glucose this morning.  PCP: Lujean Amel, MD and last visit was:   Review of Systems: Negative except as noted in the HPI.   Allergies  Allergen Reactions   Pioglitazone Swelling    Other reaction(s): swelling   Actos [Pioglitazone Hydrochloride] Swelling   Cephalexin Other (See Comments)    Doesn't remember  Other reaction(s): unknown   Lorazepam     swelling Other reaction(s): swelling   Oxycodone     This medication makes her feel sick   Oxycodone-Acetaminophen     Other reaction(s): vomiting   Rosiglitazone     unknown Other reaction(s): unknown   Tramadol Other (See Comments)    hallucinations   Tramadol Hcl     Other reaction(s): hallucinations    Objective:  There were no vitals filed for this visit. Constitutional Patient is a pleasant 80 y.o. African American female in NAD. AAO x 3.  Vascular Capillary refill time to digits immediate b/l. Palpable pedal pulses b/l LE. Pedal hair absent. Lower extremity skin temperature gradient within normal limits. No pain with calf compression b/l. No cyanosis or clubbing noted.  Neurologic Normal speech. Protective sensation diminished with 10g monofilament b/l.  Dermatologic Pedal skin with normal turgor, texture and tone bilaterally. No open wounds bilaterally. No interdigital macerations bilaterally. Toenails  2-5 bilaterally well maintained with adequate length. No erythema, no edema, no drainage, no fluctuance. Anonychia noted L hallux and R hallux. Nailbed(s) epithelialized.  Hyperkeratotic lesion(s) L hallux.  No erythema, no edema, no drainage, no fluctuance.  Orthopedic: Normal muscle strength 5/5 to all lower extremity muscle groups bilaterally. No pain crepitus or joint limitation noted with ROM b/l. No gross bony deformities bilaterally.   Assessment:   1. Pain due to onychomycosis of toenails of both feet   2. Callus   3. Diabetic peripheral neuropathy associated with type 2 diabetes mellitus ([place])    Plan:  Patient was evaluated and treated and all questions answered.  Onychomycosis with pain -Nails palliatively debridement as below. -Educated on self-care  Procedure: Nail Debridement Rationale: Pain Type of Debridement: manual, sharp debridement. Instrumentation: Nail nipper, rotary burr. Number of Nails: 8  -Examined patient. -Continue diabetic foot care principles. -Patient to continue soft, supportive shoe gear daily. -Toenails 2-5 bilaterally debrided in length and girth without iatrogenic bleeding with sterile nail nipper and dremel.  -Callus(es) L hallux pared utilizing sterile scalpel blade without complication or incident. Total number debrided =1. -Patient to report any pedal injuries to medical professional immediately. -Patient/POA to call should there be question/concern in the interim.  Return in about 3 months (around 09/13/2020).  Marzetta Board, DPM

## 2020-07-15 DIAGNOSIS — M25511 Pain in right shoulder: Secondary | ICD-10-CM | POA: Diagnosis not present

## 2020-07-16 DIAGNOSIS — M545 Low back pain, unspecified: Secondary | ICD-10-CM | POA: Diagnosis not present

## 2020-07-16 DIAGNOSIS — G8929 Other chronic pain: Secondary | ICD-10-CM | POA: Diagnosis not present

## 2020-07-23 DIAGNOSIS — Z79899 Other long term (current) drug therapy: Secondary | ICD-10-CM | POA: Diagnosis not present

## 2020-07-23 DIAGNOSIS — I700 Atherosclerosis of aorta: Secondary | ICD-10-CM | POA: Diagnosis not present

## 2020-07-23 DIAGNOSIS — I10 Essential (primary) hypertension: Secondary | ICD-10-CM | POA: Diagnosis not present

## 2020-07-23 DIAGNOSIS — M4644 Discitis, unspecified, thoracic region: Secondary | ICD-10-CM | POA: Diagnosis not present

## 2020-07-23 DIAGNOSIS — N1832 Chronic kidney disease, stage 3b: Secondary | ICD-10-CM | POA: Diagnosis not present

## 2020-07-23 DIAGNOSIS — G8929 Other chronic pain: Secondary | ICD-10-CM | POA: Diagnosis not present

## 2020-07-28 ENCOUNTER — Encounter (HOSPITAL_BASED_OUTPATIENT_CLINIC_OR_DEPARTMENT_OTHER): Payer: Self-pay

## 2020-07-28 ENCOUNTER — Other Ambulatory Visit: Payer: Self-pay

## 2020-07-28 ENCOUNTER — Emergency Department (HOSPITAL_BASED_OUTPATIENT_CLINIC_OR_DEPARTMENT_OTHER)
Admission: EM | Admit: 2020-07-28 | Discharge: 2020-07-29 | Disposition: A | Discharge status: Home / Self Care | Payer: Medicare Other | Attending: Emergency Medicine | Admitting: Emergency Medicine

## 2020-07-28 DIAGNOSIS — Z743 Need for continuous supervision: Secondary | ICD-10-CM | POA: Diagnosis not present

## 2020-07-28 DIAGNOSIS — R799 Abnormal finding of blood chemistry, unspecified: Secondary | ICD-10-CM | POA: Diagnosis present

## 2020-07-28 DIAGNOSIS — Z96651 Presence of right artificial knee joint: Secondary | ICD-10-CM | POA: Diagnosis not present

## 2020-07-28 DIAGNOSIS — E785 Hyperlipidemia, unspecified: Secondary | ICD-10-CM | POA: Diagnosis not present

## 2020-07-28 DIAGNOSIS — E1169 Type 2 diabetes mellitus with other specified complication: Secondary | ICD-10-CM | POA: Diagnosis not present

## 2020-07-28 DIAGNOSIS — Z79899 Other long term (current) drug therapy: Secondary | ICD-10-CM | POA: Diagnosis not present

## 2020-07-28 DIAGNOSIS — Z85850 Personal history of malignant neoplasm of thyroid: Secondary | ICD-10-CM | POA: Insufficient documentation

## 2020-07-28 DIAGNOSIS — I10 Essential (primary) hypertension: Secondary | ICD-10-CM | POA: Insufficient documentation

## 2020-07-28 DIAGNOSIS — Z87891 Personal history of nicotine dependence: Secondary | ICD-10-CM | POA: Diagnosis not present

## 2020-07-28 DIAGNOSIS — J45909 Unspecified asthma, uncomplicated: Secondary | ICD-10-CM | POA: Diagnosis not present

## 2020-07-28 DIAGNOSIS — R6889 Other general symptoms and signs: Secondary | ICD-10-CM | POA: Diagnosis not present

## 2020-07-28 LAB — CBC WITH DIFFERENTIAL/PLATELET
Abs Immature Granulocytes: 0.05 10*3/uL (ref 0.00–0.07)
Basophils Absolute: 0.1 10*3/uL (ref 0.0–0.1)
Basophils Relative: 0 %
Eosinophils Absolute: 0.1 10*3/uL (ref 0.0–0.5)
Eosinophils Relative: 1 %
HCT: 33.6 % — ABNORMAL LOW (ref 36.0–46.0)
Hemoglobin: 10.7 g/dL — ABNORMAL LOW (ref 12.0–15.0)
Immature Granulocytes: 0 %
Lymphocytes Relative: 15 %
Lymphs Abs: 2.1 10*3/uL (ref 0.7–4.0)
MCH: 29.5 pg (ref 26.0–34.0)
MCHC: 31.8 g/dL (ref 30.0–36.0)
MCV: 92.6 fL (ref 80.0–100.0)
Monocytes Absolute: 0.8 10*3/uL (ref 0.1–1.0)
Monocytes Relative: 6 %
Neutro Abs: 11.5 10*3/uL — ABNORMAL HIGH (ref 1.7–7.7)
Neutrophils Relative %: 78 %
Platelets: 383 10*3/uL (ref 150–400)
RBC: 3.63 MIL/uL — ABNORMAL LOW (ref 3.87–5.11)
RDW: 12.9 % (ref 11.5–15.5)
WBC: 14.7 10*3/uL — ABNORMAL HIGH (ref 4.0–10.5)
nRBC: 0 % (ref 0.0–0.2)

## 2020-07-28 LAB — COMPREHENSIVE METABOLIC PANEL
ALT: 8 U/L (ref 0–44)
AST: 9 U/L — ABNORMAL LOW (ref 15–41)
Albumin: 4 g/dL (ref 3.5–5.0)
Alkaline Phosphatase: 52 U/L (ref 38–126)
Anion gap: 12 (ref 5–15)
BUN: 27 mg/dL — ABNORMAL HIGH (ref 8–23)
CO2: 29 mmol/L (ref 22–32)
Calcium: 6.7 mg/dL — ABNORMAL LOW (ref 8.9–10.3)
Chloride: 98 mmol/L (ref 98–111)
Creatinine, Ser: 1.42 mg/dL — ABNORMAL HIGH (ref 0.44–1.00)
GFR, Estimated: 37 mL/min — ABNORMAL LOW (ref >60)
Glucose, Bld: 165 mg/dL — ABNORMAL HIGH (ref 70–99)
Potassium: 4.2 mmol/L (ref 3.5–5.1)
Sodium: 139 mmol/L (ref 135–145)
Total Bilirubin: 0.4 mg/dL (ref 0.3–1.2)
Total Protein: 6.9 g/dL (ref 6.5–8.1)

## 2020-07-28 NOTE — ED Triage Notes (Signed)
Patient coming by EMS from [place] Patient reports to the ER for hypertension and hypocalcemia.  Patient's HR 74 BP 230/90 O2 99% on RA 98 CBG Patient denies any specific complaints

## 2020-07-29 DIAGNOSIS — R5381 Other malaise: Secondary | ICD-10-CM | POA: Diagnosis not present

## 2020-07-29 DIAGNOSIS — Z743 Need for continuous supervision: Secondary | ICD-10-CM | POA: Diagnosis not present

## 2020-07-29 DIAGNOSIS — I10 Essential (primary) hypertension: Secondary | ICD-10-CM | POA: Diagnosis not present

## 2020-07-29 DIAGNOSIS — Z7401 Bed confinement status: Secondary | ICD-10-CM | POA: Diagnosis not present

## 2020-07-29 DIAGNOSIS — R531 Weakness: Secondary | ICD-10-CM | POA: Diagnosis not present

## 2020-07-29 MED ORDER — CALCIUM CARBONATE ANTACID 500 MG PO CHEW
400.0000 mg | CHEWABLE_TABLET | Freq: Once | ORAL | Status: AC
  Administered 2020-07-29: 400 mg via ORAL
  Filled 2020-07-29: qty 2

## 2020-07-29 MED ORDER — MAGNESIUM OXIDE -MG SUPPLEMENT 400 (240 MG) MG PO TABS
800.0000 mg | ORAL_TABLET | Freq: Once | ORAL | Status: AC
  Administered 2020-07-29: 800 mg via ORAL
  Filled 2020-07-29: qty 2

## 2020-07-29 MED ORDER — HYDROCODONE-ACETAMINOPHEN 5-325 MG PO TABS
1.0000 | ORAL_TABLET | Freq: Once | ORAL | Status: AC
Start: 2020-07-29 — End: 2020-07-29
  Administered 2020-07-29: 1 via ORAL
  Filled 2020-07-29: qty 1

## 2020-07-29 NOTE — ED Notes (Signed)
This RN presented the AVS utilizing Teachback Method. Patient verbalizes understanding of Discharge Instructions. Opportunity for Questioning and Answers were provided. Patient Discharged from ED via [place] with PTAR to Residence.

## 2020-07-29 NOTE — ED Notes (Signed)
Patient instructed to take Medication with Food as to avoid Nausea and Emesis. Patient stated she would not like any [employer] Available. Patient provided with more Catheryn Bacon and Peanut Butter.

## 2020-07-29 NOTE — ED Provider Notes (Addendum)
[facility] Emergency Department Provider Note MRN:  [id number]  Arrival date & time: 07/29/20     Chief Complaint   Abnormal Lab   History of Present Illness   [name] is a 80 y.o. year-old female with a history of thyroid cancer, hypertension, diabetes presenting to the ED with chief complaint of abnormal lab.  Patient was found to have abnormal labs today, low calcium.  She was then found to have hypertension and so was sent here for evaluation.  Patient explains that she has felt normal today.  She felt afraid when they told her that she needed to go to the emergency department and she thinks this is why her blood pressure was high.  She denies having any headache, no vision change, no trouble speaking, no trouble swallowing, no chest pain, no shortness of breath, no abdominal pain, no vomiting or diarrhea, no dietary change.  No new medications, no fever, no cough, no complaints at this time.  Review of Systems  A complete 10 system review of systems was obtained and all systems are negative except as noted in the HPI and PMH.   Patient's Health History    Past Medical History:  Diagnosis Date   Anemia    Asthma    Chest pain, atypical    Diabetes mellitus    GERD (gastroesophageal reflux disease)    History of transient ischemic attack (TIA)    Hyperlipidemia    Hypertension    OA (osteoarthritis)    Thyroid carcinoma ([place])     Past Surgical History:  Procedure Laterality Date   BACK SURGERY     BIOPSY  03/14/2018   Procedure: BIOPSY;  Surgeon: Wilford Corner, MD;  Location: [facility];  Service: Endoscopy;;  EGD and Colon   BREAST BIOPSY     CARDIAC CATHETERIZATION  03/18/2009   NORMAL LEFT VENTRICULAR SIZE AND CONTRACTILITY WITH NORMAL SYSTOLIC  FUNCTION. EF 60%   CARDIOLITE STUDY     SHOWED A SIGNIFICANT REVERSIBLE ANTERIOR WALL DEFECT CONSISTENT WITH ISCHEMIA. EF 55%   COLONOSCOPY WITH PROPOFOL N/A 03/14/2018   Procedure:  COLONOSCOPY WITH PROPOFOL;  Surgeon: Wilford Corner, MD;  Location: [facility];  Service: Endoscopy;  Laterality: N/A;   ESOPHAGOGASTRODUODENOSCOPY (EGD) WITH PROPOFOL N/A 03/14/2018   Procedure: ESOPHAGOGASTRODUODENOSCOPY (EGD) WITH PROPOFOL;  Surgeon: Wilford Corner, MD;  Location: [facility];  Service: Endoscopy;  Laterality: N/A;   FEMUR IM NAIL Right 04/25/2015   Procedure: INTRAMEDULLARY (IM) RIGHT  RETROGRADE FEMORAL NAILING;  Surgeon: Rod Can, MD;  Location: [facility];  Service: Orthopedics;  Laterality: Right;   IR FL GUIDED LOC OF NEEDLE/CATH TIP FOR SPINAL INJECTION RT  04/05/2017   IR FLUORO GUIDE CV LINE RIGHT  04/08/2017   IR US GUIDE VASC ACCESS RIGHT  04/08/2017   LUMBAR FUSION     POLYPECTOMY  03/14/2018   Procedure: POLYPECTOMY;  Surgeon: Wilford Corner, MD;  Location: [facility];  Service: Endoscopy;;   THYROIDECTOMY     TOTAL KNEE ARTHROPLASTY     right    Family History  Problem Relation Age of Onset   Pneumonia Mother    Heart attack Father    Breast cancer Neg Hx     Social History   Socioeconomic History   Marital status: Widowed    Spouse name: Not on file   Number of children: 5   Years of education: Not on file   Highest education level: Not on file  Occupational History  Employer: RETIRED  Tobacco Use   Smoking status: Former   Smokeless tobacco: Never  Scientific laboratory technician Use: Never used  Substance and Sexual Activity   Alcohol use: No   Drug use: No   Sexual activity: Not on file  Other Topics Concern   Not on file  Social History Narrative   Not on file   Social Determinants of Health   Financial Resource Strain: Not on file  Food Insecurity: Not on file  Transportation Needs: Not on file  Physical Activity: Not on file  Stress: Not on file  Social Connections: Not on file  Intimate Partner Violence: Not on file     Physical Exam   Vitals:   07/28/20 1943 07/28/20 2310  BP: (!) 151/79 (!) 161/66  Pulse: 67 63   Resp: 16 16  Temp: 98.1 F (36.7 C)   SpO2: 100% 100%    CONSTITUTIONAL: Well-appearing, NAD NEURO:  Alert and oriented x 3, no focal deficits EYES:  eyes equal and reactive ENT/NECK:  no LAD, no JVD CARDIO: Regular rate, well-perfused, normal S1 and S2 PULM:  CTAB no wheezing or rhonchi GI/GU:  normal bowel sounds, non-distended, non-tender MSK/SPINE:  No gross deformities, no edema SKIN:  no rash, atraumatic PSYCH:  Appropriate speech and behavior  *Additional and/or pertinent findings included in MDM below  Diagnostic and Interventional Summary    EKG Interpretation  Date/Time:  July 29, 2020 at 00: 54: 39 Ventricular Rate:  61 PR Interval:  199 QRS Duration: 94 QT Interval:  459 QTC Calculation: 463 R Axis:     Text Interpretation: Sinus rhythm, normal intervals, no ischemic findings  Confirm by Dr. Gerlene Fee at 1 AM       Labs Reviewed  CBC WITH DIFFERENTIAL/PLATELET - Abnormal; Notable for the following components:      Result Value   WBC 14.7 (*)    RBC 3.63 (*)    Hemoglobin 10.7 (*)    HCT 33.6 (*)    Neutro Abs 11.5 (*)    All other components within normal limits  COMPREHENSIVE METABOLIC PANEL - Abnormal; Notable for the following components:   Glucose, Bld 165 (*)    BUN 27 (*)    Creatinine, Ser 1.42 (*)    Calcium 6.7 (*)    AST 9 (*)    GFR, Estimated 37 (*)    All other components within normal limits    No orders to display    Medications  calcium carbonate (TUMS - dosed in mg elemental calcium) chewable tablet 400 mg of elemental calcium (has no administration in time range)  magnesium oxide (MAG-OX) tablet 800 mg (has no administration in time range)     Procedures  /  Critical Care Procedures  ED Course and Medical Decision Making  I have reviewed the triage vital signs, the nursing notes, and pertinent available records from the EMR.  Listed above are laboratory and imaging tests that I personally ordered, reviewed, and  interpreted and then considered in my medical decision making (see below for details).  Hypocalcemia, overall fairly mild at 6.7.  Patient is without complaints.  She had some transient hypotension reported at her facility, which is now resolved.  Obtaining screening EKG to ensure there are no abnormal intervals.  Patient likely needs further testing such as PTH especially given her history of thyroid cancer.  Not a feasible option here at a freestanding emergency department.  No emergent process, appropriate for discharge with  PCP follow-up.       Barth Kirks. Sedonia Small, MD [place] [email or web address]  Final Clinical Impressions(s) / ED Diagnoses     ICD-10-CM   1. Hypocalcemia  E83.51       ED Discharge Orders     None        Discharge Instructions Discussed with and Provided to Patient:    Discharge Instructions      You were evaluated in the Emergency Department and after careful evaluation, we did not find any emergent condition requiring admission or further testing in the hospital.  Your exam/testing today was overall reassuring.  Recommend follow-up with your primary care doctor to discuss further testing for your hypocalcemia.  May need to consider a PTH test.  Please return to the Emergency Department if you experience any worsening of your condition.  Thank you for allowing [place] to be a part of your care.        Maudie Flakes, MD 07/29/20 6295    Maudie Flakes, MD 07/29/20 0100

## 2020-07-29 NOTE — ED Notes (Signed)
PTAR at Bedside; report given to Same. All Questions answered.

## 2020-07-29 NOTE — ED Notes (Signed)
Report given to [place] at [facility]. All Questions answered. Patient currently awaiting for PTAR Transport back to Facility.

## 2020-07-29 NOTE — Discharge Instructions (Addendum)
You were evaluated in the Emergency Department and after careful evaluation, we did not find any emergent condition requiring admission or further testing in the hospital.  Your exam/testing today was overall reassuring.  Recommend follow-up with your primary care doctor to discuss further testing for your hypocalcemia.  May need to consider a PTH test.  Please return to the Emergency Department if you experience any worsening of your condition.  Thank you for allowing [place] to be a part of your care.

## 2020-07-31 DIAGNOSIS — E892 Postprocedural hypoparathyroidism: Secondary | ICD-10-CM | POA: Diagnosis not present

## 2020-07-31 DIAGNOSIS — C73 Malignant neoplasm of thyroid gland: Secondary | ICD-10-CM | POA: Diagnosis not present

## 2020-07-31 DIAGNOSIS — E1165 Type 2 diabetes mellitus with hyperglycemia: Secondary | ICD-10-CM | POA: Diagnosis not present

## 2020-07-31 DIAGNOSIS — I10 Essential (primary) hypertension: Secondary | ICD-10-CM | POA: Diagnosis not present

## 2020-07-31 DIAGNOSIS — N189 Chronic kidney disease, unspecified: Secondary | ICD-10-CM | POA: Diagnosis not present

## 2020-07-31 DIAGNOSIS — E890 Postprocedural hypothyroidism: Secondary | ICD-10-CM | POA: Diagnosis not present

## 2020-08-04 ENCOUNTER — Other Ambulatory Visit: Payer: Self-pay

## 2020-08-04 ENCOUNTER — Ambulatory Visit: Payer: Medicare Other | Attending: Family Medicine

## 2020-08-04 DIAGNOSIS — G8929 Other chronic pain: Secondary | ICD-10-CM | POA: Diagnosis not present

## 2020-08-04 DIAGNOSIS — M546 Pain in thoracic spine: Secondary | ICD-10-CM | POA: Diagnosis not present

## 2020-08-04 DIAGNOSIS — R2689 Other abnormalities of gait and mobility: Secondary | ICD-10-CM | POA: Diagnosis not present

## 2020-08-04 DIAGNOSIS — M6281 Muscle weakness (generalized): Secondary | ICD-10-CM | POA: Insufficient documentation

## 2020-08-04 DIAGNOSIS — R252 Cramp and spasm: Secondary | ICD-10-CM | POA: Diagnosis not present

## 2020-08-04 DIAGNOSIS — M5442 Lumbago with sciatica, left side: Secondary | ICD-10-CM | POA: Insufficient documentation

## 2020-08-04 NOTE — Patient Instructions (Signed)
Access Code: [email or web address] URL: [email or web address] Date: 08/04/2020 Prepared by: Claiborne Billings  Exercises Seated Long Arc Quad - 3 x daily - 7 x weekly - 1 sets - 10 reps - 5 hold Seated March - 3 x daily - 7 x weekly - 1 sets - 10 reps Seated Heel Toe Raises - 3 x daily - 7 x weekly - 2 sets - 10 reps Seated Heel Raise - 3 x daily - 7 x weekly - 2 sets - 10 reps Seated Hamstring Stretch - 3 x daily - 7 x weekly - 1 sets - 3 reps - 20 hold

## 2020-08-04 NOTE — Therapy (Signed)
[facility] [address], [place], [place], [address] Phone: [phone]   Fax:  [phone]  Physical Therapy Evaluation  Patient Details  Name: [name] MRN: [id number] Date of Birth: 13-Jul-1940 Referring Provider (PT): [name], [place] MD   Encounter Date: 08/04/2020   PT End of Session - 08/04/20 1702     Visit Number 1    Date for PT Re-Evaluation 09/29/20    Authorization Type UHC medicare    PT Start Time 8366    PT Stop Time 1618    PT Time Calculation (min) 44 min    Activity Tolerance Patient tolerated treatment well    Behavior During Therapy [facility] for tasks assessed/performed             Past Medical History:  Diagnosis Date   Anemia    Asthma    Chest pain, atypical    Diabetes mellitus    GERD (gastroesophageal reflux disease)    History of transient ischemic attack (TIA)    Hyperlipidemia    Hypertension    OA (osteoarthritis)    Thyroid carcinoma ([place])     Past Surgical History:  Procedure Laterality Date   BACK SURGERY     BIOPSY  03/14/2018   Procedure: BIOPSY;  Surgeon: Wilford Corner, MD;  Location: [facility];  Service: Endoscopy;;  EGD and Colon   BREAST BIOPSY     CARDIAC CATHETERIZATION  03/18/2009   NORMAL LEFT VENTRICULAR SIZE AND CONTRACTILITY WITH NORMAL SYSTOLIC  FUNCTION. EF 60%   CARDIOLITE STUDY     SHOWED A SIGNIFICANT REVERSIBLE ANTERIOR WALL DEFECT CONSISTENT WITH ISCHEMIA. EF 55%   COLONOSCOPY WITH PROPOFOL N/A 03/14/2018   Procedure: COLONOSCOPY WITH PROPOFOL;  Surgeon: Wilford Corner, MD;  [facility];  Service: Endoscopy;  Laterality: N/A;   ESOPHAGOGASTRODUODENOSCOPY (EGD) WITH PROPOFOL N/A 03/14/2018   Procedure: ESOPHAGOGASTRODUODENOSCOPY (EGD) WITH PROPOFOL;  Surgeon: Wilford Corner, MD;  [facility];  Service: Endoscopy;  Laterality: N/A;   FEMUR IM NAIL Right 04/25/2015   Procedure: INTRAMEDULLARY (IM) RIGHT  RETROGRADE  FEMORAL NAILING;  Surgeon: Rod Can, MD;  [facility]: [facility];  Service: Orthopedics;  Laterality: Right;   IR FL GUIDED LOC OF NEEDLE/CATH TIP FOR SPINAL INJECTION RT  04/05/2017   IR FLUORO GUIDE CV LINE RIGHT  04/08/2017   IR US GUIDE VASC ACCESS RIGHT  04/08/2017   LUMBAR FUSION     POLYPECTOMY  03/14/2018   Procedure: POLYPECTOMY;  Surgeon: Wilford Corner, MD;  [facility];  Service: Endoscopy;;   THYROIDECTOMY     TOTAL KNEE ARTHROPLASTY     right    There were no vitals filed for this visit.    Subjective Assessment - 08/04/20 1538     Subjective Pt with LBP and Rt LE pain that has been chronic for 10 years.  Order from MD is for thoracic pain although pt reports that she does not have pain now.  Pt had lumbar surgery in ~2009  due to radiculopathy and this pain resolved.  Pt reports that ~1 week ago, pain began in low back and intermittent Lt LE pain.  Pt has walked with a walker x 10 years.    Pertinent History lumbar surgery Dr [name]- > 5 years,  walker use for all mobility, thyroid cancer, osteoporosis, CVA 2009, Rt sided weakness    Limitations Standing;Walking    How long can you walk comfortably? walking limited by mobility, not LBP- 1-2 minutes now due  to LBP, prior to onset of pain, could walk 5-10 min    Diagnostic tests MRI 2019- infection on spine- had to have IV    Patient Stated Goals reduce LBP, return to prior level of function    Currently in Pain? Yes    Pain Score 8     Pain Location Back    Pain Orientation Left;Lower    Pain Descriptors / Indicators Aching    Pain Type Chronic pain    Pain Radiating Towards Lt gluteals    Pain Onset More than a month ago    Pain Frequency Constant    Aggravating Factors  standing and walking    Pain Relieving Factors sitting, pain medication                [facility] PT Assessment - 08/04/20 0001       Assessment   Medical Diagnosis Rt upper thoracic pain, discitis of thoracic region    Referring  Provider (PT) [name], [name] MD    Onset Date/Surgical Date 08/05/10    Prior Therapy for shoulder at this clinic      Precautions   Precautions Fall      Restrictions   Weight Bearing Restrictions No      Balance Screen   Has the patient fallen in the past 6 months No    Has the patient had a decrease in activity level because of a fear of falling?  No    Is the patient reluctant to leave their home because of a fear of falling?  No      Home Environment   Living Environment Private residence    Living Arrangements Alone    Type of [place] living facility    Home Access Level entry    Additional Comments [place]      Prior Function   Level of [place] device for independence;Needs assistance with ADLs;Needs assistance with homemaking      Cognition   Overall Cognitive Status Within Functional Limits for tasks assessed      Observation/Other Assessments   Focus on Therapeutic Outcomes (FOTO)  34 (53 is goal)      Posture/Postural Control   Posture/Postural Control Postural limitations    Postural Limitations Flexed trunk;Forward head      ROM / Strength   AROM / PROM / Strength AROM;Strength      AROM   Overall AROM  Unable to assess    Overall AROM Comments limited by pain in standing and balance      Strength   Overall Strength Deficits    Overall Strength Comments 4/5 bil hips, 4+/5 bil knees      Palpation   Spinal mobility not assessed    Palpation comment Lt gluteals and SI joint- tender to palpation      Transfers   Transfers Sit to Stand;Stand to Sit    Sit to Stand With upper extremity assist;6: Modified independent (Device/Increase time)    Stand to Sit With upper extremity assist      Ambulation/Gait   Ambulation/Gait Yes    Ambulation/Gait Assistance 6: Modified independent (Device/Increase time)    Assistive device Rolling walker    Gait Pattern Trunk flexed;Lateral trunk lean to right;Antalgic                         Objective measurements completed on examination: See above findings.  PT Education - 08/04/20 1614     Education Details Access Code: [email or web address]    Person(s) Educated Patient    Methods Explanation;Demonstration;Handout    Comprehension Verbalized understanding;Returned demonstration              PT Short Term Goals - 08/04/20 1603       PT SHORT TERM GOAL #1   Title be independent in initial HEP    Time 4    Period Weeks    Status New    Target Date 09/01/20      PT SHORT TERM GOAL #2   Title report a > or = to 30% reduction in LBP with standing and walking    Time 4    Period Weeks    Status New    Target Date 09/01/20      PT SHORT TERM GOAL #3   Title reduce LBP to allow for standing and walking > or = to 5 minutes without limitation    Time 4    Period Weeks    Status New    Target Date 09/01/20      PT SHORT TERM GOAL #4   Title ----      PT SHORT TERM GOAL #5   Title -----               PT Long Term Goals - 08/04/20 1604       PT LONG TERM GOAL #1   Title The patient will be independent with safe self progression of HEP    Time 8    Period Weeks    Status New    Target Date 09/29/20      PT LONG TERM GOAL #2   Title increase FOTO to > = to 53 to improve function    Time 8    Period Weeks    Status New    Target Date 09/29/20      PT LONG TERM GOAL #3   Title stand and walk with erect posture due to reduced LBP    Time 8    Period Weeks    Status New    Target Date 09/29/20      PT LONG TERM GOAL #4   Title stand and walk for 5-10 minutes without limitation due to LBP to improve function and independence    Time 8    Period Weeks    Status New    Target Date 09/29/20      PT LONG TERM GOAL #5   Title report < or = to 4/10 max LBP with standing and walking    Time 8    Period Weeks    Status New    Target Date 09/29/20                    Plan - 08/04/20 1659      Clinical Impression Statement Pt presents to PT with LBP and Lt LE radiculopathy that began 1 week ago.  Pt has a history of lumbar laminectomy in 2008 and significant mobility issues.  Pt reports that she now has 8/10 LBP that limits her ability to walk for > 1-2 minutes with previous ability of 5-10 minutes.  Pt reports reduced pain with sitting down or taking pain medication.  Pt has ambulated with a rolling walker for the past 10 years per pt report.  Pt stands and walks with significant forward flexion and reduced gait speed with guarded pattern.  Pt  requires max UE support with sit to stand transition.  Pt with palpable tenderness over Lt SI joint, sacrum and Lt gluteals.  Pt will benefit from skilled PT to address new onset of LBP and improve mobility and function.    Personal Factors and Comorbidities Comorbidity 3+    Comorbidities lumbar laminectomy, thyroid cancer, walker use x 10 years    Examination-Activity Limitations Carry;Locomotion Level;Stairs;Stand;Transfers    Examination-Participation Restrictions Community Activity;Meal Prep;Cleaning    Stability/Clinical Decision Making Evolving/Moderate complexity    Clinical Decision Making Moderate    Rehab Potential Good    PT Frequency 2x / week    PT Duration 8 weeks    PT Treatment/Interventions ADLs/Self Care Home Management;Cryotherapy;Electrical Stimulation;Gait training;Stair training;Functional mobility training;Therapeutic activities;Therapeutic exercise;Neuromuscular re-education;Balance training;Patient/family education;Manual techniques;Dry needling;Taping;Spinal Manipulations;Joint Manipulations    PT Next Visit Plan work on sit to stand, lumbar flexibility, home TENs, Addaday to Lt gluteals, review HEP    PT Home Exercise Plan Access Code: [email or web address]    Consulted and Agree with Plan of Care Patient             Patient will benefit from skilled therapeutic intervention in order to improve the following deficits and  impairments:  Decreased range of motion, Difficulty walking, Decreased endurance, Increased muscle spasms, Decreased activity tolerance, Pain, Improper body mechanics, Impaired flexibility, Decreased balance, Postural dysfunction  Visit Diagnosis: Pain in thoracic spine - Plan: PT plan of care cert/re-cert  Chronic left-sided low back pain with left-sided sciatica - Plan: PT plan of care cert/re-cert  Cramp and spasm - Plan: PT plan of care cert/re-cert  Other abnormalities of gait and mobility - Plan: PT plan of care cert/re-cert     Problem List Patient Active Problem List   Diagnosis Date Noted   Age-related osteoporosis without current pathological fracture 06/13/2020   CVA, old, speech/language deficit 06/13/2020   Generalized abdominal pain 06/13/2020   Long term (current) use of insulin ([place]) 06/13/2020   Osteoporosis without current pathological fracture 06/13/2020   Pure hypercholesterolemia 06/13/2020   Pain in joint of right shoulder 06/26/2019   Iron deficiency anemia, unspecified 03/14/2018   Chronic pain syndrome 11/04/2017   Thoracic radiculopathy 11/04/2017   Pes anserinus bursitis of right knee 08/12/2017   GERD without esophagitis 05/12/2017   Pleuritic chest pain 05/04/2017   Medication monitoring encounter 05/04/2017   Dyslipidemia associated with type 2 diabetes mellitus ([place]) 04/20/2017   Hypertension associated with diabetes ([place]) 04/20/2017   Cerebrovascular accident (CVA) with involvement of right side of body ([place]) 04/20/2017   Peripheral autonomic neuropathy due to diabetes mellitus ([place]) 04/20/2017   Hypertensive renal disease with renal failure, stage 1-4 or unspecified chronic kidney disease 04/20/2017   Constipation 04/06/2017   Status post lumbar spinal fusion 04/03/2017   Recurrent UTI 04/03/2017   Chronic renal insufficiency 04/03/2017   Normocytic anemia 04/03/2017   Osteomyelitis of thoracic spine ([place]) 04/02/2017   Thoracic discitis  04/02/2017   DDD (degenerative disc disease), lumbar 12/31/2016   Narcotic dependence ([place]) 10/24/2015   Periprosthetic fracture around internal prosthetic right knee joint 04/25/2015   Fracture, femur, distal, right, closed, initial encounter 04/24/2015   Closed fracture of distal end of right femur, initial encounter ([place]) 04/24/2015   Acute gout 10/17/2013   Hypoparathyroidism ([place]) 03/02/2012   Benign essential tremor 09/28/2010   Combined hyperlipidemia associated with type 2 diabetes mellitus ([place]) 09/26/2009   Osteopenia 09/26/2009   Postoperative hypothyroidism 09/26/2009   Multiple pulmonary nodules 08/22/2009   OSTEOARTHRITIS, KNEES,  BILATERAL 08/07/2009   Allergic rhinitis 11/19/2008   History of thyroid cancer 09/26/2008   Spinal stenosis of lumbar region 03/26/2008   Hypothyroidism 07/03/2007   Insulin dependent type 2 diabetes mellitus, controlled ([place]) 07/03/2007   Spinal epidural abscess 07/03/2007   Benign essential HTN 07/03/2007   PSOAS MUSCLE ABSCESS 06/09/2007    Sigurd Sos, PT 08/04/20 5:05 PM   [facility] [address] [place], [place], [address] Phone: [phone]   Fax:  [phone]  Name: VANIA ROSERO MRN: [id number] Date of Birth: July 10, 1940

## 2020-08-06 ENCOUNTER — Ambulatory Visit: Payer: Medicare Other | Admitting: Physical Therapy

## 2020-08-06 ENCOUNTER — Other Ambulatory Visit: Payer: Self-pay

## 2020-08-06 ENCOUNTER — Encounter: Payer: Self-pay | Admitting: Physical Therapy

## 2020-08-06 DIAGNOSIS — M546 Pain in thoracic spine: Secondary | ICD-10-CM | POA: Diagnosis not present

## 2020-08-06 DIAGNOSIS — M5442 Lumbago with sciatica, left side: Secondary | ICD-10-CM | POA: Diagnosis not present

## 2020-08-06 DIAGNOSIS — R252 Cramp and spasm: Secondary | ICD-10-CM | POA: Diagnosis not present

## 2020-08-06 DIAGNOSIS — R2689 Other abnormalities of gait and mobility: Secondary | ICD-10-CM

## 2020-08-06 DIAGNOSIS — G8929 Other chronic pain: Secondary | ICD-10-CM

## 2020-08-06 DIAGNOSIS — M6281 Muscle weakness (generalized): Secondary | ICD-10-CM | POA: Diagnosis not present

## 2020-08-06 NOTE — Therapy (Signed)
[facility] [address] [place], [place], [address] Phone: [phone]   Fax:  [phone]  Physical Therapy Treatment  Patient Details  Name: [name] MRN: [id number] Date of Birth: [birth year] Referring Provider (PT): [name], [place] MD   Encounter Date: 08/06/2020   PT End of Session - 08/06/20 1511     Visit Number 2    Date for PT Re-Evaluation 09/29/20    Authorization Type UHC medicare    PT Start Time 1447    PT Stop Time 4496    PT Time Calculation (min) 40 min    Activity Tolerance Patient limited by pain    Behavior During Therapy Agitated             Past Medical History:  Diagnosis Date   Anemia    Asthma    Chest pain, atypical    Diabetes mellitus    GERD (gastroesophageal reflux disease)    History of transient ischemic attack (TIA)    Hyperlipidemia    Hypertension    OA (osteoarthritis)    Thyroid carcinoma ([place])     Past Surgical History:  Procedure Laterality Date   BACK SURGERY     BIOPSY  03/14/2018   Procedure: BIOPSY;  Surgeon: Wilford Corner, MD;  [facility];  Service: Endoscopy;;  EGD and Colon   BREAST BIOPSY     CARDIAC CATHETERIZATION  03/18/2009   NORMAL LEFT VENTRICULAR SIZE AND CONTRACTILITY WITH NORMAL SYSTOLIC  FUNCTION. EF 60%   CARDIOLITE STUDY     SHOWED A SIGNIFICANT REVERSIBLE ANTERIOR WALL DEFECT CONSISTENT WITH ISCHEMIA. EF 55%   COLONOSCOPY WITH PROPOFOL N/A 03/14/2018   Procedure: COLONOSCOPY WITH PROPOFOL;  Surgeon: Wilford Corner, MD;  [facility];  Service: Endoscopy;  Laterality: N/A;   ESOPHAGOGASTRODUODENOSCOPY (EGD) WITH PROPOFOL N/A 03/14/2018   Procedure: ESOPHAGOGASTRODUODENOSCOPY (EGD) WITH PROPOFOL;  Surgeon: Wilford Corner, MD;  [facility];  Service: Endoscopy;  Laterality: N/A;   FEMUR IM NAIL Right 04/25/2015   Procedure: INTRAMEDULLARY (IM) RIGHT  RETROGRADE FEMORAL NAILING;  Surgeon: Rod Can, MD;  [facility];  Service: Orthopedics;  Laterality: Right;   IR FL GUIDED LOC OF NEEDLE/CATH TIP FOR SPINAL INJECTION RT  04/05/2017   IR FLUORO GUIDE CV LINE RIGHT  04/08/2017   IR US GUIDE VASC ACCESS RIGHT  04/08/2017   LUMBAR FUSION     POLYPECTOMY  03/14/2018   Procedure: POLYPECTOMY;  Surgeon: Wilford Corner, MD;  [facility];  Service: Endoscopy;;   THYROIDECTOMY     TOTAL KNEE ARTHROPLASTY     right    There were no vitals filed for this visit.   Subjective Assessment - 08/06/20 1450     Subjective I have not done any exercises because the pain has been so bad. I am trying to stay away from any pain meds because I get too constipated.    Pertinent History lumbar surgery Dr [name]- > 5 years,  walker use for all mobility, thyroid cancer, osteoporosis, CVA 2009, Rt sided weakness    How long can you walk comfortably? walking limited by mobility, not LBP- 1-2 minutes now due to LBP, prior to onset of pain, could walk 5-10 min    Diagnostic tests MRI 2019- infection on spine- had to have IV    Patient Stated Goals reduce LBP, return to prior level of function    Currently in Pain? Yes    Pain Score 8  Pain Location Back    Pain Orientation Lower    Pain Descriptors / Indicators Sharp    Multiple Pain Sites No                               [facility] Adult PT Treatment/Exercise - 08/06/20 0001       Lumbar Exercises: Aerobic   Nustep L1 5 min with MHP to lumbar      Electrical Stimulation   Electrical Stimulation Location Lower lumbar    Electrical Stimulation Action IFc    Electrical Stimulation Parameters 80-150 Hz    Electrical Stimulation Goals Pain      Manual Therapy   Soft tissue mobilization Addaday asst seated to low back                      PT Short Term Goals - 08/04/20 1603       PT SHORT TERM GOAL #1   Title be independent in initial HEP    Time 4    Period Weeks    Status New    Target Date  09/01/20      PT SHORT TERM GOAL #2   Title report a > or = to 30% reduction in LBP with standing and walking    Time 4    Period Weeks    Status New    Target Date 09/01/20      PT SHORT TERM GOAL #3   Title reduce LBP to allow for standing and walking > or = to 5 minutes without limitation    Time 4    Period Weeks    Status New    Target Date 09/01/20      PT SHORT TERM GOAL #4   Title ----      PT SHORT TERM GOAL #5   Title -----               PT Long Term Goals - 08/04/20 1604       PT LONG TERM GOAL #1   Title The patient will be independent with safe self progression of HEP    Time 8    Period Weeks    Status New    Target Date 09/29/20      PT LONG TERM GOAL #2   Title increase FOTO to > = to 53 to improve function    Time 8    Period Weeks    Status New    Target Date 09/29/20      PT LONG TERM GOAL #3   Title stand and walk with erect posture due to reduced LBP    Time 8    Period Weeks    Status New    Target Date 09/29/20      PT LONG TERM GOAL #4   Title stand and walk for 5-10 minutes without limitation due to LBP to improve function and independence    Time 8    Period Weeks    Status New    Target Date 09/29/20      PT LONG TERM GOAL #5   Title report < or = to 4/10 max LBP with standing and walking    Time 8    Period Weeks    Status New    Target Date 09/29/20                   Plan -  08/06/20 1504     Clinical Impression Statement Pt presents with high levels of back pain, initially pt aggitated secondary to her pain. Pt reported not doing her HEp due to pain. Pt was hesitant to do much PT but became more agreeable to the trying the Nustep and requested Estim for pain reduction. Pt wa sable to stand more erect at the end of todays session PTA encouraged pt to try her HEP slowly and assured her it would not damage her back. Pt agreed to try.    Personal Factors and Comorbidities Comorbidity 3+    Comorbidities lumbar  laminectomy, thyroid cancer, walker use x 10 years    Examination-Activity Limitations Carry;Locomotion Level;Stairs;Stand;Transfers    Examination-Participation Restrictions Community Activity;Meal Prep;Cleaning    Stability/Clinical Decision Making Evolving/Moderate complexity    Rehab Potential Good    PT Frequency 2x / week    PT Duration 8 weeks    PT Treatment/Interventions ADLs/Self Care Home Management;Cryotherapy;Electrical Stimulation;Gait training;Stair training;Functional mobility training;Therapeutic activities;Therapeutic exercise;Neuromuscular re-education;Balance training;Patient/family education;Manual techniques;Dry needling;Taping;Spinal Manipulations;Joint Manipulations    PT Next Visit Plan Review HEP, Nustep, see if pt interested in home TENS; pt may not be able to get on by herself.    PT Home Exercise Plan Access Code: [email or web address]    Consulted and Agree with Plan of Care Patient             Patient will benefit from skilled therapeutic intervention in order to improve the following deficits and impairments:  Decreased range of motion, Difficulty walking, Decreased endurance, Increased muscle spasms, Decreased activity tolerance, Pain, Improper body mechanics, Impaired flexibility, Decreased balance, Postural dysfunction  Visit Diagnosis: Pain in thoracic spine  Chronic left-sided low back pain with left-sided sciatica  Cramp and spasm  Other abnormalities of gait and mobility     Problem List Patient Active Problem List   Diagnosis Date Noted   Age-related osteoporosis without current pathological fracture 06/13/2020   CVA, old, speech/language deficit 06/13/2020   Generalized abdominal pain 06/13/2020   Long term (current) use of insulin ([place]) 06/13/2020   Osteoporosis without current pathological fracture 06/13/2020   Pure hypercholesterolemia 06/13/2020   Pain in joint of right shoulder 06/26/2019   Iron deficiency anemia, unspecified 03/14/2018    Chronic pain syndrome 11/04/2017   Thoracic radiculopathy 11/04/2017   Pes anserinus bursitis of right knee 08/12/2017   GERD without esophagitis 05/12/2017   Pleuritic chest pain 05/04/2017   Medication monitoring encounter 05/04/2017   Dyslipidemia associated with type 2 diabetes mellitus ([place]) 04/20/2017   Hypertension associated with diabetes ([place]) 04/20/2017   Cerebrovascular accident (CVA) with involvement of right side of body ([place]) 04/20/2017   Peripheral autonomic neuropathy due to diabetes mellitus ([place]) 04/20/2017   Hypertensive renal disease with renal failure, stage 1-4 or unspecified chronic kidney disease 04/20/2017   Constipation 04/06/2017   Status post lumbar spinal fusion 04/03/2017   Recurrent UTI 04/03/2017   Chronic renal insufficiency 04/03/2017   Normocytic anemia 04/03/2017   Osteomyelitis of thoracic spine ([place]) 04/02/2017   Thoracic discitis 04/02/2017   DDD (degenerative disc disease), lumbar 12/31/2016   Narcotic dependence ([place]) 10/24/2015   Periprosthetic fracture around internal prosthetic right knee joint 04/25/2015   Fracture, femur, distal, right, closed, initial encounter 04/24/2015   Closed fracture of distal end of right femur, initial encounter ([place]) 04/24/2015   Acute gout 10/17/2013   Hypoparathyroidism ([place]) 03/02/2012   Benign essential tremor 09/28/2010   Combined hyperlipidemia associated with type 2 diabetes mellitus ([place]) 09/26/2009  Osteopenia 09/26/2009   Postoperative hypothyroidism 09/26/2009   Multiple pulmonary nodules 08/22/2009   OSTEOARTHRITIS, KNEES, BILATERAL 08/07/2009   Allergic rhinitis 11/19/2008   History of thyroid cancer 09/26/2008   Spinal stenosis of lumbar region 03/26/2008   Hypothyroidism 07/03/2007   Insulin dependent type 2 diabetes mellitus, controlled ([place]) 07/03/2007   Spinal epidural abscess 07/03/2007   Benign essential HTN 07/03/2007   PSOAS MUSCLE ABSCESS 06/09/2007    Zayne Marovich,  PTA 08/06/2020, 3:17 PM  [facility] [address] [place], [place], [address] Phone: [phone]   Fax:  [phone]  Name: JAIDALYN SCHILLO MRN: [id number] Date of Birth: 12-16-1940

## 2020-08-11 ENCOUNTER — Ambulatory Visit: Payer: Medicare Other

## 2020-08-11 ENCOUNTER — Other Ambulatory Visit: Payer: Self-pay

## 2020-08-11 DIAGNOSIS — R2689 Other abnormalities of gait and mobility: Secondary | ICD-10-CM | POA: Diagnosis not present

## 2020-08-11 DIAGNOSIS — R252 Cramp and spasm: Secondary | ICD-10-CM

## 2020-08-11 DIAGNOSIS — G8929 Other chronic pain: Secondary | ICD-10-CM

## 2020-08-11 DIAGNOSIS — M5442 Lumbago with sciatica, left side: Secondary | ICD-10-CM | POA: Diagnosis not present

## 2020-08-11 DIAGNOSIS — M6281 Muscle weakness (generalized): Secondary | ICD-10-CM | POA: Diagnosis not present

## 2020-08-11 DIAGNOSIS — M546 Pain in thoracic spine: Secondary | ICD-10-CM | POA: Diagnosis not present

## 2020-08-11 NOTE — Therapy (Signed)
[facility] [address], [place], [place], [address] Phone: [phone]   Fax:  [phone]  Physical Therapy Treatment  Patient Details  Name: [name] MRN: [id number] Date of Birth: Mar 27, 1940 Referring Provider (PT): [name], [place] MD   Encounter Date: 08/11/2020   PT End of Session - 08/11/20 1227     Visit Number 3    Date for PT Re-Evaluation 09/29/20    Authorization Type UHC medicare    PT Start Time 0354    PT Stop Time 1238    PT Time Calculation (min) 47 min    Activity Tolerance Patient tolerated treatment well    Behavior During Therapy [facility] for tasks assessed/performed             Past Medical History:  Diagnosis Date   Anemia    Asthma    Chest pain, atypical    Diabetes mellitus    GERD (gastroesophageal reflux disease)    History of transient ischemic attack (TIA)    Hyperlipidemia    Hypertension    OA (osteoarthritis)    Thyroid carcinoma ([place])     Past Surgical History:  Procedure Laterality Date   BACK SURGERY     BIOPSY  03/14/2018   Procedure: BIOPSY;  Surgeon: Wilford Corner, MD;  Location: [facility];  Service: Endoscopy;;  EGD and Colon   BREAST BIOPSY     CARDIAC CATHETERIZATION  03/18/2009   NORMAL LEFT VENTRICULAR SIZE AND CONTRACTILITY WITH NORMAL SYSTOLIC  FUNCTION. EF 60%   CARDIOLITE STUDY     SHOWED A SIGNIFICANT REVERSIBLE ANTERIOR WALL DEFECT CONSISTENT WITH ISCHEMIA. EF 55%   COLONOSCOPY WITH PROPOFOL N/A 03/14/2018   Procedure: COLONOSCOPY WITH PROPOFOL;  Surgeon: Wilford Corner, MD;  [facility];  Service: Endoscopy;  Laterality: N/A;   ESOPHAGOGASTRODUODENOSCOPY (EGD) WITH PROPOFOL N/A 03/14/2018   Procedure: ESOPHAGOGASTRODUODENOSCOPY (EGD) WITH PROPOFOL;  Surgeon: Wilford Corner, MD;  [facility];  Service: Endoscopy;  Laterality: N/A;   FEMUR IM NAIL Right 04/25/2015   Procedure: INTRAMEDULLARY (IM) RIGHT  RETROGRADE  FEMORAL NAILING;  Surgeon: Rod Can, MD;  [facility];  Service: Orthopedics;  Laterality: Right;   IR FL GUIDED LOC OF NEEDLE/CATH TIP FOR SPINAL INJECTION RT  04/05/2017   IR FLUORO GUIDE CV LINE RIGHT  04/08/2017   IR US GUIDE VASC ACCESS RIGHT  04/08/2017   LUMBAR FUSION     POLYPECTOMY  03/14/2018   Procedure: POLYPECTOMY;  Surgeon: Wilford Corner, MD;  [facility];  Service: Endoscopy;;   THYROIDECTOMY     TOTAL KNEE ARTHROPLASTY     right    There were no vitals filed for this visit.   Subjective Assessment - 08/11/20 1152     Subjective I felt really good over the weekend, pain was 4-5/10.  today it is really hurting.    Currently in Pain? Yes    Pain Score 8     Pain Location Back    Pain Orientation Lower    Pain Descriptors / Indicators Sharp    Pain Type Chronic pain    Pain Onset More than a month ago    Pain Frequency Constant    Aggravating Factors  standing and walking    Pain Relieving Factors sitting, pain medication                               [facility] Adult PT Treatment/Exercise -  08/11/20 0001       Exercises   Exercises Knee/Hip;Lumbar      Lumbar Exercises: Aerobic   Nustep L1 5 min with MHP to lumbar      Knee/Hip Exercises: Stretches   Active Hamstring Stretch 3 reps;20 seconds      Knee/Hip Exercises: Aerobic   Nustep Level 1x 7 min- PT present to monitor      Knee/Hip Exercises: Seated   Long Arc Quad Both;2 sets;10 reps    Marching Both;2 sets;10 reps    Abd/Adduction Limitations heel/toe raises      Modalities   Modalities Moist Heat      Moist Heat Therapy   Number Minutes Moist Heat 15 Minutes    Moist Heat Location Hip   with e-stim     Electrical Stimulation   Electrical Stimulation Location Lower lumbar    Electrical Stimulation Action IFC    Electrical Stimulation Parameters 15 min    Electrical Stimulation Goals Pain      Manual Therapy   Soft tissue mobilization Addaday with pt  seated                      PT Short Term Goals - 08/04/20 1603       PT SHORT TERM GOAL #1   Title be independent in initial HEP    Time 4    Period Weeks    Status New    Target Date 09/01/20      PT SHORT TERM GOAL #2   Title report a > or = to 30% reduction in LBP with standing and walking    Time 4    Period Weeks    Status New    Target Date 09/01/20      PT SHORT TERM GOAL #3   Title reduce LBP to allow for standing and walking > or = to 5 minutes without limitation    Time 4    Period Weeks    Status New    Target Date 09/01/20      PT SHORT TERM GOAL #4   Title ----      PT SHORT TERM GOAL #5   Title -----               PT Long Term Goals - 08/04/20 1604       PT LONG TERM GOAL #1   Title The patient will be independent with safe self progression of HEP    Time 8    Period Weeks    Status New    Target Date 09/29/20      PT LONG TERM GOAL #2   Title increase FOTO to > = to 53 to improve function    Time 8    Period Weeks    Status New    Target Date 09/29/20      PT LONG TERM GOAL #3   Title stand and walk with erect posture due to reduced LBP    Time 8    Period Weeks    Status New    Target Date 09/29/20      PT LONG TERM GOAL #4   Title stand and walk for 5-10 minutes without limitation due to LBP to improve function and independence    Time 8    Period Weeks    Status New    Target Date 09/29/20      PT LONG TERM GOAL #5   Title report <  or = to 4/10 max LBP with standing and walking    Time 8    Period Weeks    Status New    Target Date 09/29/20                   Plan - 08/11/20 1216     Clinical Impression Statement Pt had a good 3-4 days after last treatment. Her pain is now elevated today.  Pt is now doing her HEP although needed review and frequent tactile and demo cues.  Pt did well with gentle seated exercise and 10 min on the NuStep today.  Pt responds well to electrical stimulation so this  was repeated today.  Pt will continue to benefit from skilled PT to improve mobility and address chronic and significant LBP.    PT Frequency 2x / week    PT Duration 8 weeks    PT Treatment/Interventions ADLs/Self Care Home Management;Cryotherapy;Electrical Stimulation;Gait training;Stair training;Functional mobility training;Therapeutic activities;Therapeutic exercise;Neuromuscular re-education;Balance training;Patient/family education;Manual techniques;Dry needling;Taping;Spinal Manipulations;Joint Manipulations    PT Next Visit Plan activity as tolerated, manual, modalities to address LBP    PT Home Exercise Plan Access Code: [email or web address]    Recommended Other Services initial cert is signed    Consulted and Agree with Plan of Care Patient             Patient will benefit from skilled therapeutic intervention in order to improve the following deficits and impairments:  Decreased range of motion, Difficulty walking, Decreased endurance, Increased muscle spasms, Decreased activity tolerance, Pain, Improper body mechanics, Impaired flexibility, Decreased balance, Postural dysfunction  Visit Diagnosis: Pain in thoracic spine  Cramp and spasm  Other abnormalities of gait and mobility  Chronic left-sided low back pain with left-sided sciatica     Problem List Patient Active Problem List   Diagnosis Date Noted   Age-related osteoporosis without current pathological fracture 06/13/2020   CVA, old, speech/language deficit 06/13/2020   Generalized abdominal pain 06/13/2020   Long term (current) use of insulin ([place]) 06/13/2020   Osteoporosis without current pathological fracture 06/13/2020   Pure hypercholesterolemia 06/13/2020   Pain in joint of right shoulder 06/26/2019   Iron deficiency anemia, unspecified 03/14/2018   Chronic pain syndrome 11/04/2017   Thoracic radiculopathy 11/04/2017   Pes anserinus bursitis of right knee 08/12/2017   GERD without esophagitis 05/12/2017    Pleuritic chest pain 05/04/2017   Medication monitoring encounter 05/04/2017   Dyslipidemia associated with type 2 diabetes mellitus ([place]) 04/20/2017   Hypertension associated with diabetes ([place]) 04/20/2017   Cerebrovascular accident (CVA) with involvement of right side of body ([place]) 04/20/2017   Peripheral autonomic neuropathy due to diabetes mellitus ([place]) 04/20/2017   Hypertensive renal disease with renal failure, stage 1-4 or unspecified chronic kidney disease 04/20/2017   Constipation 04/06/2017   Status post lumbar spinal fusion 04/03/2017   Recurrent UTI 04/03/2017   Chronic renal insufficiency 04/03/2017   Normocytic anemia 04/03/2017   Osteomyelitis of thoracic spine ([place]) 04/02/2017   Thoracic discitis 04/02/2017   DDD (degenerative disc disease), lumbar 12/31/2016   Narcotic dependence ([place]) 10/24/2015   Periprosthetic fracture around internal prosthetic right knee joint 04/25/2015   Fracture, femur, distal, right, closed, initial encounter 04/24/2015   Closed fracture of distal end of right femur, initial encounter ([place]) 04/24/2015   Acute gout 10/17/2013   Hypoparathyroidism ([place]) 03/02/2012   Benign essential tremor 09/28/2010   Combined hyperlipidemia associated with type 2 diabetes mellitus ([place]) 09/26/2009   Osteopenia 09/26/2009  Postoperative hypothyroidism 09/26/2009   Multiple pulmonary nodules 08/22/2009   OSTEOARTHRITIS, KNEES, BILATERAL 08/07/2009   Allergic rhinitis 11/19/2008   History of thyroid cancer 09/26/2008   Spinal stenosis of lumbar region 03/26/2008   Hypothyroidism 07/03/2007   Insulin dependent type 2 diabetes mellitus, controlled ([place]) 07/03/2007   Spinal epidural abscess 07/03/2007   Benign essential HTN 07/03/2007   PSOAS MUSCLE ABSCESS 06/09/2007    Sigurd Sos, PT 08/11/20 12:29 PM   [facility] [address] [place], [place], [address] Phone: [phone]   Fax:   [phone]  Name: [name] MRN: [id number] Date of Birth: [birth year]

## 2020-08-12 DIAGNOSIS — D72829 Elevated white blood cell count, unspecified: Secondary | ICD-10-CM | POA: Diagnosis not present

## 2020-08-13 ENCOUNTER — Other Ambulatory Visit: Payer: Self-pay

## 2020-08-13 ENCOUNTER — Ambulatory Visit: Payer: Medicare Other | Admitting: Physical Therapy

## 2020-08-13 DIAGNOSIS — G8929 Other chronic pain: Secondary | ICD-10-CM | POA: Diagnosis not present

## 2020-08-13 DIAGNOSIS — R252 Cramp and spasm: Secondary | ICD-10-CM | POA: Diagnosis not present

## 2020-08-13 DIAGNOSIS — M5442 Lumbago with sciatica, left side: Secondary | ICD-10-CM

## 2020-08-13 DIAGNOSIS — R2689 Other abnormalities of gait and mobility: Secondary | ICD-10-CM | POA: Diagnosis not present

## 2020-08-13 DIAGNOSIS — M546 Pain in thoracic spine: Secondary | ICD-10-CM

## 2020-08-13 DIAGNOSIS — M6281 Muscle weakness (generalized): Secondary | ICD-10-CM | POA: Diagnosis not present

## 2020-08-13 NOTE — Therapy (Signed)
[facility] [address] [place], [place], [address] Phone: [phone]   Fax:  [phone]  Physical Therapy Treatment  Patient Details  Name: [name] MRN: [id number] Date of Birth: 30-Dec-1940 Referring Provider (PT): [name], [place] MD   Encounter Date: 08/13/2020   PT End of Session - 08/13/20 1455     Visit Number 4    Date for PT Re-Evaluation 09/29/20    Authorization Type UHC medicare    PT Start Time 1450    PT Stop Time 1535    PT Time Calculation (min) 45 min    Activity Tolerance Patient tolerated treatment well    Behavior During Therapy [facility] for tasks assessed/performed             Past Medical History:  Diagnosis Date   Anemia    Asthma    Chest pain, atypical    Diabetes mellitus    GERD (gastroesophageal reflux disease)    History of transient ischemic attack (TIA)    Hyperlipidemia    Hypertension    OA (osteoarthritis)    Thyroid carcinoma ([place])     Past Surgical History:  Procedure Laterality Date   BACK SURGERY     BIOPSY  03/14/2018   Procedure: BIOPSY;  Surgeon: Wilford Corner, MD;  [facility];  Service: Endoscopy;;  EGD and Colon   BREAST BIOPSY     CARDIAC CATHETERIZATION  03/18/2009   NORMAL LEFT VENTRICULAR SIZE AND CONTRACTILITY WITH NORMAL SYSTOLIC  FUNCTION. EF 60%   CARDIOLITE STUDY     SHOWED A SIGNIFICANT REVERSIBLE ANTERIOR WALL DEFECT CONSISTENT WITH ISCHEMIA. EF 55%   COLONOSCOPY WITH PROPOFOL N/A 03/14/2018   Procedure: COLONOSCOPY WITH PROPOFOL;  Surgeon: Wilford Corner, MD;  [facility];  Service: Endoscopy;  Laterality: N/A;   ESOPHAGOGASTRODUODENOSCOPY (EGD) WITH PROPOFOL N/A 03/14/2018   Procedure: ESOPHAGOGASTRODUODENOSCOPY (EGD) WITH PROPOFOL;  Surgeon: Wilford Corner, MD;  [facility];  Service: Endoscopy;  Laterality: N/A;   FEMUR IM NAIL Right 04/25/2015   Procedure: INTRAMEDULLARY (IM) RIGHT  RETROGRADE  FEMORAL NAILING;  Surgeon: Rod Can, MD;  [facility];  Service: Orthopedics;  Laterality: Right;   IR FL GUIDED LOC OF NEEDLE/CATH TIP FOR SPINAL INJECTION RT  04/05/2017   IR FLUORO GUIDE CV LINE RIGHT  04/08/2017   IR US GUIDE VASC ACCESS RIGHT  04/08/2017   LUMBAR FUSION     POLYPECTOMY  03/14/2018   Procedure: POLYPECTOMY;  Surgeon: Wilford Corner, MD;  [facility];  Service: Endoscopy;;   THYROIDECTOMY     TOTAL KNEE ARTHROPLASTY     right    There were no vitals filed for this visit.   Subjective Assessment - 08/13/20 1457     Subjective I am hurting very bad today, woke up with it, hurting all day. i do not want the massage today.    Pertinent History lumbar surgery Dr [name]- > 5 years,  walker use for all mobility, thyroid cancer, osteoporosis, CVA 2009, Rt sided weakness    How long can you walk comfortably? walking limited by mobility, not LBP- 1-2 minutes now due to LBP, prior to onset of pain, could walk 5-10 min    Currently in Pain? Yes    Pain Score 9     Pain Location Back    Pain Orientation Lower    Pain Descriptors / Indicators Tightness;Hervey Ard  [place] Adult PT Treatment/Exercise - 08/13/20 0001       Knee/Hip Exercises: Stretches   Active Hamstring Stretch 3 reps;20 seconds    Other Knee/Hip Stretches Forward flexion stretch with green ball 3 sec hold 5x2      Knee/Hip Exercises: Aerobic   Nustep L2 x 10 min PTA present to discuss pain      Moist Heat Therapy   Number Minutes Moist Heat 15 Minutes    Moist Heat Location Lumbar Spine      Electrical Stimulation   Electrical Stimulation Location Lower lumbar    Electrical Stimulation Action IFC    Electrical Stimulation Parameters 15 min    Electrical Stimulation Goals Pain                      PT Short Term Goals - 08/04/20 1603       PT SHORT TERM GOAL #1   Title be independent in initial HEP    Time 4    Period  Weeks    Status New    Target Date 09/01/20      PT SHORT TERM GOAL #2   Title report a > or = to 30% reduction in LBP with standing and walking    Time 4    Period Weeks    Status New    Target Date 09/01/20      PT SHORT TERM GOAL #3   Title reduce LBP to allow for standing and walking > or = to 5 minutes without limitation    Time 4    Period Weeks    Status New    Target Date 09/01/20      PT SHORT TERM GOAL #4   Title ----      PT SHORT TERM GOAL #5   Title -----               PT Long Term Goals - 08/04/20 1604       PT LONG TERM GOAL #1   Title The patient will be independent with safe self progression of HEP    Time 8    Period Weeks    Status New    Target Date 09/29/20      PT LONG TERM GOAL #2   Title increase FOTO to > = to 53 to improve function    Time 8    Period Weeks    Status New    Target Date 09/29/20      PT LONG TERM GOAL #3   Title stand and walk with erect posture due to reduced LBP    Time 8    Period Weeks    Status New    Target Date 09/29/20      PT LONG TERM GOAL #4   Title stand and walk for 5-10 minutes without limitation due to LBP to improve function and independence    Time 8    Period Weeks    Status New    Target Date 09/29/20      PT LONG TERM GOAL #5   Title report < or = to 4/10 max LBP with standing and walking    Time 8    Period Weeks    Status New    Target Date 09/29/20                   Plan - 08/13/20 1459     Clinical Impression Statement Pt presents with increased pain  today, woke up with the pain and have had it all day. Pt tolerated back stretches well, stating "it felt good." Pt wanted to try therapy without the soft tissue work to see if that was "too much." Post therapy pain was 5/10.    Personal Factors and Comorbidities Comorbidity 3+    Comorbidities lumbar laminectomy, thyroid cancer, walker use x 10 years    Examination-Activity Limitations Carry;Locomotion  Level;Stairs;Stand;Transfers    Examination-Participation Restrictions Community Activity;Meal Prep;Cleaning    Stability/Clinical Decision Making Evolving/Moderate complexity    Rehab Potential Good    PT Frequency 2x / week    PT Duration 8 weeks    PT Treatment/Interventions ADLs/Self Care Home Management;Cryotherapy;Electrical Stimulation;Gait training;Stair training;Functional mobility training;Therapeutic activities;Therapeutic exercise;Neuromuscular re-education;Balance training;Patient/family education;Manual techniques;Dry needling;Taping;Spinal Manipulations;Joint Manipulations    PT Next Visit Plan activity as tolerated, manual, modalities to address LBP    PT Home Exercise Plan Access Code: [email or web address]    Consulted and Agree with Plan of Care Patient             Patient will benefit from skilled therapeutic intervention in order to improve the following deficits and impairments:  Decreased range of motion, Difficulty walking, Decreased endurance, Increased muscle spasms, Decreased activity tolerance, Pain, Improper body mechanics, Impaired flexibility, Decreased balance, Postural dysfunction  Visit Diagnosis: Pain in thoracic spine  Other abnormalities of gait and mobility  Cramp and spasm  Chronic left-sided low back pain with left-sided sciatica     Problem List Patient Active Problem List   Diagnosis Date Noted   Age-related osteoporosis without current pathological fracture 06/13/2020   CVA, old, speech/language deficit 06/13/2020   Generalized abdominal pain 06/13/2020   Long term (current) use of insulin ([place]) 06/13/2020   Osteoporosis without current pathological fracture 06/13/2020   Pure hypercholesterolemia 06/13/2020   Pain in joint of right shoulder 06/26/2019   Iron deficiency anemia, unspecified 03/14/2018   Chronic pain syndrome 11/04/2017   Thoracic radiculopathy 11/04/2017   Pes anserinus bursitis of right knee 08/12/2017   GERD without  esophagitis 05/12/2017   Pleuritic chest pain 05/04/2017   Medication monitoring encounter 05/04/2017   Dyslipidemia associated with type 2 diabetes mellitus ([place]) 04/20/2017   Hypertension associated with diabetes ([place]) 04/20/2017   Cerebrovascular accident (CVA) with involvement of right side of body ([place]) 04/20/2017   Peripheral autonomic neuropathy due to diabetes mellitus ([place]) 04/20/2017   Hypertensive renal disease with renal failure, stage 1-4 or unspecified chronic kidney disease 04/20/2017   Constipation 04/06/2017   Status post lumbar spinal fusion 04/03/2017   Recurrent UTI 04/03/2017   Chronic renal insufficiency 04/03/2017   Normocytic anemia 04/03/2017   Osteomyelitis of thoracic spine ([place]) 04/02/2017   Thoracic discitis 04/02/2017   DDD (degenerative disc disease), lumbar 12/31/2016   Narcotic dependence ([place]) 10/24/2015   Periprosthetic fracture around internal prosthetic right knee joint 04/25/2015   Fracture, femur, distal, right, closed, initial encounter 04/24/2015   Closed fracture of distal end of right femur, initial encounter ([place]) 04/24/2015   Acute gout 10/17/2013   Hypoparathyroidism ([place]) 03/02/2012   Benign essential tremor 09/28/2010   Combined hyperlipidemia associated with type 2 diabetes mellitus ([place]) 09/26/2009   Osteopenia 09/26/2009   Postoperative hypothyroidism 09/26/2009   Multiple pulmonary nodules 08/22/2009   OSTEOARTHRITIS, KNEES, BILATERAL 08/07/2009   Allergic rhinitis 11/19/2008   History of thyroid cancer 09/26/2008   Spinal stenosis of lumbar region 03/26/2008   Hypothyroidism 07/03/2007   Insulin dependent type 2 diabetes mellitus, controlled ([place]) 07/03/2007  Spinal epidural abscess 07/03/2007   Benign essential HTN 07/03/2007   PSOAS MUSCLE ABSCESS 06/09/2007    Avary Eichenberger, PTA 08/13/2020, 3:21 PM  [facility] [address] [place], [place],  [address] Phone: [phone]   Fax:  [phone]  Name: [name] MRN: [id number] Date of Birth: February 12, 1940

## 2020-08-20 ENCOUNTER — Other Ambulatory Visit: Payer: Self-pay

## 2020-08-20 ENCOUNTER — Ambulatory Visit: Payer: Medicare Other | Admitting: Physical Therapy

## 2020-08-20 DIAGNOSIS — M546 Pain in thoracic spine: Secondary | ICD-10-CM | POA: Diagnosis not present

## 2020-08-20 DIAGNOSIS — G8929 Other chronic pain: Secondary | ICD-10-CM | POA: Diagnosis not present

## 2020-08-20 DIAGNOSIS — M5442 Lumbago with sciatica, left side: Secondary | ICD-10-CM | POA: Diagnosis not present

## 2020-08-20 DIAGNOSIS — M6281 Muscle weakness (generalized): Secondary | ICD-10-CM

## 2020-08-20 DIAGNOSIS — R252 Cramp and spasm: Secondary | ICD-10-CM | POA: Diagnosis not present

## 2020-08-20 DIAGNOSIS — R2689 Other abnormalities of gait and mobility: Secondary | ICD-10-CM

## 2020-08-20 NOTE — Therapy (Signed)
[facility] [address], [place], [place], [address] Phone: [phone]   Fax:  [phone]  Physical Therapy Treatment  Patient Details  Name: [name] MRN: [id number] Date of Birth: [birth year] Referring Provider (PT): [name], [place] MD   Encounter Date: 08/20/2020   PT End of Session - 08/20/20 1249     Visit Number 5    Date for PT Re-Evaluation 09/29/20    Authorization Type UHC medicare    PT Start Time 0938    PT Stop Time 1829    PT Time Calculation (min) 40 min    Activity Tolerance Patient tolerated treatment well    Behavior During Therapy [facility] for tasks assessed/performed             Past Medical History:  Diagnosis Date   Anemia    Asthma    Chest pain, atypical    Diabetes mellitus    GERD (gastroesophageal reflux disease)    History of transient ischemic attack (TIA)    Hyperlipidemia    Hypertension    OA (osteoarthritis)    Thyroid carcinoma ([place])     Past Surgical History:  Procedure Laterality Date   BACK SURGERY     BIOPSY  03/14/2018   Procedure: BIOPSY;  Surgeon: Wilford Corner, MD;  Location: [facility];  Service: Endoscopy;;  EGD and Colon   BREAST BIOPSY     CARDIAC CATHETERIZATION  03/18/2009   NORMAL LEFT VENTRICULAR SIZE AND CONTRACTILITY WITH NORMAL SYSTOLIC  FUNCTION. EF 60%   CARDIOLITE STUDY     SHOWED A SIGNIFICANT REVERSIBLE ANTERIOR WALL DEFECT CONSISTENT WITH ISCHEMIA. EF 55%   COLONOSCOPY WITH PROPOFOL N/A 03/14/2018   Procedure: COLONOSCOPY WITH PROPOFOL;  Surgeon: Wilford Corner, MD;  [facility];  Service: Endoscopy;  Laterality: N/A;   ESOPHAGOGASTRODUODENOSCOPY (EGD) WITH PROPOFOL N/A 03/14/2018   Procedure: ESOPHAGOGASTRODUODENOSCOPY (EGD) WITH PROPOFOL;  Surgeon: Wilford Corner, MD;  [facility];  Service: Endoscopy;  Laterality: N/A;   FEMUR IM NAIL Right 04/25/2015   Procedure: INTRAMEDULLARY (IM) RIGHT  RETROGRADE  FEMORAL NAILING;  Surgeon: Rod Can, MD;  [facility]: [facility];  Service: Orthopedics;  Laterality: Right;   IR FL GUIDED LOC OF NEEDLE/CATH TIP FOR SPINAL INJECTION RT  04/05/2017   IR FLUORO GUIDE CV LINE RIGHT  04/08/2017   IR US GUIDE VASC ACCESS RIGHT  04/08/2017   LUMBAR FUSION     POLYPECTOMY  03/14/2018   Procedure: POLYPECTOMY;  Surgeon: Wilford Corner, MD;  [facility];  Service: Endoscopy;;   THYROIDECTOMY     TOTAL KNEE ARTHROPLASTY     right    There were no vitals filed for this visit.   Subjective Assessment - 08/20/20 1242     Subjective Pt reports she is feeling better today, less pain than last visit    Pertinent History lumbar surgery Dr [name]- > 5 years,  walker use for all mobility, thyroid cancer, osteoporosis, CVA 2009, Rt sided weakness    Limitations Standing;Walking    How long can you walk comfortably? walking limited by mobility, not LBP- 1-2 minutes now due to LBP, prior to onset of pain, could walk 5-10 min    Diagnostic tests MRI 2019- infection on spine- had to have IV    Currently in Pain? Yes    Pain Score 5     Pain Location Back    Pain Orientation Lower    Pain Descriptors / Indicators Tightness    Pain  Type Chronic pain    Pain Onset More than a month ago    Pain Frequency Constant                               [facility] Adult PT Treatment/Exercise - 08/20/20 0001       Exercises   Exercises Knee/Hip;Lumbar      Lumbar Exercises: Seated   Sit to Stand Limitations 2x5      Knee/Hip Exercises: Stretches   Other Knee/Hip Stretches Forward flexion stretch with blue ball 3 sec hold x10      Knee/Hip Exercises: Aerobic   Nustep L2 x 10 min PT present to discuss pain and stretching      Knee/Hip Exercises: Standing   Other Standing Knee Exercises standing marching with walker 2x10      Modalities   Modalities Moist Heat   PT present throughout discussing mobility and pts noted improvements with pain levels  and mobility     Moist Heat Therapy   Number Minutes Moist Heat 5 Minutes    Moist Heat Location Lumbar Spine                    PT Education - 08/20/20 1248     Education Details Access Code: [email or web address]; cues for proper technique with all exercises    Person(s) Educated Patient    Methods Explanation;Demonstration;Tactile cues;Verbal cues    Comprehension Verbalized understanding;Returned demonstration              PT Short Term Goals - 08/04/20 1603       PT SHORT TERM GOAL #1   Title be independent in initial HEP    Time 4    Period Weeks    Status New    Target Date 09/01/20      PT SHORT TERM GOAL #2   Title report a > or = to 30% reduction in LBP with standing and walking    Time 4    Period Weeks    Status New    Target Date 09/01/20      PT SHORT TERM GOAL #3   Title reduce LBP to allow for standing and walking > or = to 5 minutes without limitation    Time 4    Period Weeks    Status New    Target Date 09/01/20      PT SHORT TERM GOAL #4   Title ----      PT SHORT TERM GOAL #5   Title -----               PT Long Term Goals - 08/04/20 1604       PT LONG TERM GOAL #1   Title The patient will be independent with safe self progression of HEP    Time 8    Period Weeks    Status New    Target Date 09/29/20      PT LONG TERM GOAL #2   Title increase FOTO to > = to 53 to improve function    Time 8    Period Weeks    Status New    Target Date 09/29/20      PT LONG TERM GOAL #3   Title stand and walk with erect posture due to reduced LBP    Time 8    Period Weeks    Status New    Target Date 09/29/20  PT LONG TERM GOAL #4   Title stand and walk for 5-10 minutes without limitation due to LBP to improve function and independence    Time 8    Period Weeks    Status New    Target Date 09/29/20      PT LONG TERM GOAL #5   Title report < or = to 4/10 max LBP with standing and walking    Time 8    Period Weeks    Status  New    Target Date 09/29/20                   Plan - 08/20/20 1249     Clinical Impression Statement Pt presents to clinic reporting improvement with back pain since last session and that she feels the nustep and stretching has helped a alot. Pt tolerated session well and focused on lumbar mobility and standing tolerance with mild balance and hip strengthening tasks. Pt fatigued in standing quickly but denied increased pain. Pt would benefit from continued PT for improvement in balance, pain levels, strengthening and flexibility and increased tolerance to activity.    Personal Factors and Comorbidities Comorbidity 3+    Comorbidities lumbar laminectomy, thyroid cancer, walker use x 10 years    Examination-Activity Limitations Carry;Locomotion Level;Stairs;Stand;Transfers    Examination-Participation Restrictions Community Activity;Meal Prep;Cleaning    Stability/Clinical Decision Making Evolving/Moderate complexity    Clinical Decision Making Moderate    Rehab Potential Good    PT Frequency 2x / week    PT Duration 8 weeks    PT Treatment/Interventions ADLs/Self Care Home Management;Cryotherapy;Electrical Stimulation;Gait training;Stair training;Functional mobility training;Therapeutic activities;Therapeutic exercise;Neuromuscular re-education;Balance training;Patient/family education;Manual techniques;Dry needling;Taping;Spinal Manipulations;Joint Manipulations    PT Next Visit Plan activity as tolerated, manual, modalities to address LBP    PT Home Exercise Plan Access Code: [email or web address]    Consulted and Agree with Plan of Care Patient             Patient will benefit from skilled therapeutic intervention in order to improve the following deficits and impairments:  Decreased range of motion, Difficulty walking, Decreased endurance, Increased muscle spasms, Decreased activity tolerance, Pain, Improper body mechanics, Impaired flexibility, Decreased balance, Postural  dysfunction  Visit Diagnosis: Muscle weakness (generalized)  Other abnormalities of gait and mobility     Problem List Patient Active Problem List   Diagnosis Date Noted   Age-related osteoporosis without current pathological fracture 06/13/2020   CVA, old, speech/language deficit 06/13/2020   Generalized abdominal pain 06/13/2020   Long term (current) use of insulin ([place]) 06/13/2020   Osteoporosis without current pathological fracture 06/13/2020   Pure hypercholesterolemia 06/13/2020   Pain in joint of right shoulder 06/26/2019   Iron deficiency anemia, unspecified 03/14/2018   Chronic pain syndrome 11/04/2017   Thoracic radiculopathy 11/04/2017   Pes anserinus bursitis of right knee 08/12/2017   GERD without esophagitis 05/12/2017   Pleuritic chest pain 05/04/2017   Medication monitoring encounter 05/04/2017   Dyslipidemia associated with type 2 diabetes mellitus ([place]) 04/20/2017   Hypertension associated with diabetes ([place]) 04/20/2017   Cerebrovascular accident (CVA) with involvement of right side of body ([place]) 04/20/2017   Peripheral autonomic neuropathy due to diabetes mellitus ([place]) 04/20/2017   Hypertensive renal disease with renal failure, stage 1-4 or unspecified chronic kidney disease 04/20/2017   Constipation 04/06/2017   Status post lumbar spinal fusion 04/03/2017   Recurrent UTI 04/03/2017   Chronic renal insufficiency 04/03/2017   Normocytic anemia 04/03/2017   Osteomyelitis of thoracic spine (  [place]) 04/02/2017   Thoracic discitis 04/02/2017   DDD (degenerative disc disease), lumbar 12/31/2016   Narcotic dependence ([place]) 10/24/2015   Periprosthetic fracture around internal prosthetic right knee joint 04/25/2015   Fracture, femur, distal, right, closed, initial encounter 04/24/2015   Closed fracture of distal end of right femur, initial encounter ([place]) 04/24/2015   Acute gout 10/17/2013   Hypoparathyroidism ([place]) 03/02/2012   Benign essential tremor 09/28/2010    Combined hyperlipidemia associated with type 2 diabetes mellitus ([place]) 09/26/2009   Osteopenia 09/26/2009   Postoperative hypothyroidism 09/26/2009   Multiple pulmonary nodules 08/22/2009   OSTEOARTHRITIS, KNEES, BILATERAL 08/07/2009   Allergic rhinitis 11/19/2008   History of thyroid cancer 09/26/2008   Spinal stenosis of lumbar region 03/26/2008   Hypothyroidism 07/03/2007   Insulin dependent type 2 diabetes mellitus, controlled ([place]) 07/03/2007   Spinal epidural abscess 07/03/2007   Benign essential HTN 07/03/2007   PSOAS MUSCLE ABSCESS 06/09/2007    Stacy Gardner, PT 08/17/221:22 PM   [facility] [address], [place], [place], [address] Phone: [phone]   Fax:  [phone]  Name: RAFAELITA FOISTER MRN: [id number] Date of Birth: 12/10/1940

## 2020-08-25 ENCOUNTER — Ambulatory Visit: Payer: Medicare Other | Admitting: Physical Therapy

## 2020-08-25 ENCOUNTER — Encounter: Payer: Self-pay | Admitting: Physical Therapy

## 2020-08-25 ENCOUNTER — Other Ambulatory Visit: Payer: Self-pay

## 2020-08-25 DIAGNOSIS — M546 Pain in thoracic spine: Secondary | ICD-10-CM

## 2020-08-25 DIAGNOSIS — M6281 Muscle weakness (generalized): Secondary | ICD-10-CM

## 2020-08-25 DIAGNOSIS — M5442 Lumbago with sciatica, left side: Secondary | ICD-10-CM | POA: Diagnosis not present

## 2020-08-25 DIAGNOSIS — G8929 Other chronic pain: Secondary | ICD-10-CM

## 2020-08-25 DIAGNOSIS — R2689 Other abnormalities of gait and mobility: Secondary | ICD-10-CM | POA: Diagnosis not present

## 2020-08-25 DIAGNOSIS — R252 Cramp and spasm: Secondary | ICD-10-CM | POA: Diagnosis not present

## 2020-08-25 NOTE — Therapy (Signed)
[facility] [address] [place], [place], [address] Phone: [phone]   Fax:  [phone]  Physical Therapy Treatment  Patient Details  Name: [name] MRN: [id number] Date of Birth: 09/07/1940 Referring Provider (PT): [place], [place] MD   Encounter Date: 08/25/2020   PT End of Session - 08/25/20 1019     Visit Number 6    Date for PT Re-Evaluation 09/29/20    Authorization Type UHC medicare    PT Start Time 1018    PT Stop Time 1110   time needed to use bathroom, IFC end of session   PT Time Calculation (min) 52 min    Activity Tolerance Patient tolerated treatment well    Behavior During Therapy [facility] for tasks assessed/performed             Past Medical History:  Diagnosis Date   Anemia    Asthma    Chest pain, atypical    Diabetes mellitus    GERD (gastroesophageal reflux disease)    History of transient ischemic attack (TIA)    Hyperlipidemia    Hypertension    OA (osteoarthritis)    Thyroid carcinoma ([place])     Past Surgical History:  Procedure Laterality Date   BACK SURGERY     BIOPSY  03/14/2018   Procedure: BIOPSY;  Surgeon: Wilford Corner, MD;  [facility];  Service: Endoscopy;;  EGD and Colon   BREAST BIOPSY     CARDIAC CATHETERIZATION  03/18/2009   NORMAL LEFT VENTRICULAR SIZE AND CONTRACTILITY WITH NORMAL SYSTOLIC  FUNCTION. EF 60%   CARDIOLITE STUDY     SHOWED A SIGNIFICANT REVERSIBLE ANTERIOR WALL DEFECT CONSISTENT WITH ISCHEMIA. EF 55%   COLONOSCOPY WITH PROPOFOL N/A 03/14/2018   Procedure: COLONOSCOPY WITH PROPOFOL;  Surgeon: Wilford Corner, MD;  [facility];  Service: Endoscopy;  Laterality: N/A;   ESOPHAGOGASTRODUODENOSCOPY (EGD) WITH PROPOFOL N/A 03/14/2018   Procedure: ESOPHAGOGASTRODUODENOSCOPY (EGD) WITH PROPOFOL;  Surgeon: Wilford Corner, MD;  [facility];  Service: Endoscopy;  Laterality: N/A;   FEMUR IM NAIL Right 04/25/2015    Procedure: INTRAMEDULLARY (IM) RIGHT  RETROGRADE FEMORAL NAILING;  Surgeon: Rod Can, MD;  [facility];  Service: Orthopedics;  Laterality: Right;   IR FL GUIDED LOC OF NEEDLE/CATH TIP FOR SPINAL INJECTION RT  04/05/2017   IR FLUORO GUIDE CV LINE RIGHT  04/08/2017   IR US GUIDE VASC ACCESS RIGHT  04/08/2017   LUMBAR FUSION     POLYPECTOMY  03/14/2018   Procedure: POLYPECTOMY;  Surgeon: Wilford Corner, MD;  [facility];  Service: Endoscopy;;   THYROIDECTOMY     TOTAL KNEE ARTHROPLASTY     right    There were no vitals filed for this visit.   Subjective Assessment - 08/25/20 1019     Subjective My pain is low today.  3/10.  I had more pain yesterday - too much to make it to church.  I want to do 15 min on the NuStep today and want the heat and estim.    Pertinent History lumbar surgery Dr [name]- > 5 years,  walker use for all mobility, thyroid cancer, osteoporosis, CVA 2009, Rt sided weakness    Limitations Standing;Walking    How long can you walk comfortably? walking limited by mobility, not LBP- 1-2 minutes now due to LBP, prior to onset of pain, could walk 5-10 min    Diagnostic tests MRI 2019- infection on spine- had to have IV  Patient Stated Goals reduce LBP, return to prior level of function    Currently in Pain? Yes    Pain Score 3     Pain Location Back    Pain Orientation Lower    Pain Descriptors / Indicators Tightness    Pain Type Chronic pain    Pain Onset More than a month ago    Pain Frequency Constant    Aggravating Factors  standing, walking    Pain Relieving Factors sitting, pain meds                               [facility] Adult PT Treatment/Exercise - 08/25/20 0001       Exercises   Exercises Knee/Hip;Lumbar      Lumbar Exercises: Stretches   Other Lumbar Stretch Exercise seated lumbar flexion ball rollouts 10x3" holds      Lumbar Exercises: Aerobic   Nustep L4 (per Pt request) x 15' (per Pt request) with lumbar  heat, seat 9, arms 10, PT present to monitor and discuss progress      Knee/Hip Exercises: Standing   Hip Flexion 20 reps;Knee bent    Hip Flexion Limitations bil UE support    Gait Training weight shifting 3-way (lateral, stagger Rt/Lt)      Modalities   Modalities Moist Heat      Moist Heat Therapy   Number Minutes Moist Heat 15 Minutes   concurrent with NuStep   Moist Heat Location Lumbar Spine      Electrical Stimulation   Electrical Stimulation Location Lower lumbar    Electrical Stimulation Action IFC    Electrical Stimulation Parameters 15 min    Electrical Stimulation Goals Pain                      PT Short Term Goals - 08/04/20 1603       PT SHORT TERM GOAL #1   Title be independent in initial HEP    Time 4    Period Weeks    Status New    Target Date 09/01/20      PT SHORT TERM GOAL #2   Title report a > or = to 30% reduction in LBP with standing and walking    Time 4    Period Weeks    Status New    Target Date 09/01/20      PT SHORT TERM GOAL #3   Title reduce LBP to allow for standing and walking > or = to 5 minutes without limitation    Time 4    Period Weeks    Status New    Target Date 09/01/20      PT SHORT TERM GOAL #4   Title ----      PT SHORT TERM GOAL #5   Title -----               PT Long Term Goals - 08/04/20 1604       PT LONG TERM GOAL #1   Title The patient will be independent with safe self progression of HEP    Time 8    Period Weeks    Status New    Target Date 09/29/20      PT LONG TERM GOAL #2   Title increase FOTO to > = to 53 to improve function    Time 8    Period Weeks    Status New  Target Date 09/29/20      PT LONG TERM GOAL #3   Title stand and walk with erect posture due to reduced LBP    Time 8    Period Weeks    Status New    Target Date 09/29/20      PT LONG TERM GOAL #4   Title stand and walk for 5-10 minutes without limitation due to LBP to improve function and independence     Time 8    Period Weeks    Status New    Target Date 09/29/20      PT LONG TERM GOAL #5   Title report < or = to 4/10 max LBP with standing and walking    Time 8    Period Weeks    Status New    Target Date 09/29/20                   Plan - 08/25/20 1033     Clinical Impression Statement Pt arrives with lower pain rating today, 3/10.  She has good days and bad days and feels she may be having more good days since starting PT.  She feels the heat, stim, time on NuStep and stretches are a good combo for her within sessions.  She was able to increase time on NuStep today to 15 min with good tolerance.  PT encouraged her to continue working toward more standing time to work towards goals.  She was able to complete a brief series of standing ther ex with UE support at counter today.  Continue along POC.    Comorbidities lumbar laminectomy, thyroid cancer, walker use x 10 years    PT Frequency 2x / week    PT Duration 8 weeks    PT Treatment/Interventions ADLs/Self Care Home Management;Cryotherapy;Electrical Stimulation;Gait training;Stair training;Functional mobility training;Therapeutic activities;Therapeutic exercise;Neuromuscular re-education;Balance training;Patient/family education;Manual techniques;Dry needling;Taping;Spinal Manipulations;Joint Manipulations    PT Next Visit Plan NuStep up to 15', work on standing tolerance, activity as tolerated, manual, modalities to address LBP (likes IFC and heat seated)    PT Home Exercise Plan Access Code: [email or web address]    Consulted and Agree with Plan of Care Patient             Patient will benefit from skilled therapeutic intervention in order to improve the following deficits and impairments:     Visit Diagnosis: Muscle weakness (generalized)  Other abnormalities of gait and mobility  Pain in thoracic spine  Cramp and spasm  Chronic left-sided low back pain with left-sided sciatica     Problem List Patient Active  Problem List   Diagnosis Date Noted   Age-related osteoporosis without current pathological fracture 06/13/2020   CVA, old, speech/language deficit 06/13/2020   Generalized abdominal pain 06/13/2020   Long term (current) use of insulin ([place]) 06/13/2020   Osteoporosis without current pathological fracture 06/13/2020   Pure hypercholesterolemia 06/13/2020   Pain in joint of right shoulder 06/26/2019   Iron deficiency anemia, unspecified 03/14/2018   Chronic pain syndrome 11/04/2017   Thoracic radiculopathy 11/04/2017   Pes anserinus bursitis of right knee 08/12/2017   GERD without esophagitis 05/12/2017   Pleuritic chest pain 05/04/2017   Medication monitoring encounter 05/04/2017   Dyslipidemia associated with type 2 diabetes mellitus ([place]) 04/20/2017   Hypertension associated with diabetes ([place]) 04/20/2017   Cerebrovascular accident (CVA) with involvement of right side of body ([place]) 04/20/2017   Peripheral autonomic neuropathy due to diabetes mellitus ([place]) 04/20/2017   Hypertensive renal disease  with renal failure, stage 1-4 or unspecified chronic kidney disease 04/20/2017   Constipation 04/06/2017   Status post lumbar spinal fusion 04/03/2017   Recurrent UTI 04/03/2017   Chronic renal insufficiency 04/03/2017   Normocytic anemia 04/03/2017   Osteomyelitis of thoracic spine ([place]) 04/02/2017   Thoracic discitis 04/02/2017   DDD (degenerative disc disease), lumbar 12/31/2016   Narcotic dependence ([place]) 10/24/2015   Periprosthetic fracture around internal prosthetic right knee joint 04/25/2015   Fracture, femur, distal, right, closed, initial encounter 04/24/2015   Closed fracture of distal end of right femur, initial encounter ([place]) 04/24/2015   Acute gout 10/17/2013   Hypoparathyroidism ([place]) 03/02/2012   Benign essential tremor 09/28/2010   Combined hyperlipidemia associated with type 2 diabetes mellitus ([place]) 09/26/2009   Osteopenia 09/26/2009   Postoperative hypothyroidism  09/26/2009   Multiple pulmonary nodules 08/22/2009   OSTEOARTHRITIS, KNEES, BILATERAL 08/07/2009   Allergic rhinitis 11/19/2008   History of thyroid cancer 09/26/2008   Spinal stenosis of lumbar region 03/26/2008   Hypothyroidism 07/03/2007   Insulin dependent type 2 diabetes mellitus, controlled ([place]) 07/03/2007   Spinal epidural abscess 07/03/2007   Benign essential HTN 07/03/2007   PSOAS MUSCLE ABSCESS 06/09/2007   Baruch Merl, PT 08/25/20 12:51 PM   [facility] [address], [place], [place], [address] Phone: [phone]   Fax:  [phone]  Name: [name] MRN: [id number] Date of Birth: [birth year]

## 2020-08-27 ENCOUNTER — Encounter: Payer: Medicare Other | Admitting: Physical Therapy

## 2020-09-01 ENCOUNTER — Encounter: Payer: Self-pay | Admitting: Physical Therapy

## 2020-09-01 ENCOUNTER — Ambulatory Visit: Payer: Medicare Other | Admitting: Physical Therapy

## 2020-09-01 ENCOUNTER — Other Ambulatory Visit: Payer: Self-pay

## 2020-09-01 DIAGNOSIS — R252 Cramp and spasm: Secondary | ICD-10-CM

## 2020-09-01 DIAGNOSIS — M5442 Lumbago with sciatica, left side: Secondary | ICD-10-CM | POA: Diagnosis not present

## 2020-09-01 DIAGNOSIS — R2689 Other abnormalities of gait and mobility: Secondary | ICD-10-CM | POA: Diagnosis not present

## 2020-09-01 DIAGNOSIS — M546 Pain in thoracic spine: Secondary | ICD-10-CM | POA: Diagnosis not present

## 2020-09-01 DIAGNOSIS — M6281 Muscle weakness (generalized): Secondary | ICD-10-CM | POA: Diagnosis not present

## 2020-09-01 DIAGNOSIS — G8929 Other chronic pain: Secondary | ICD-10-CM | POA: Diagnosis not present

## 2020-09-01 NOTE — Therapy (Signed)
[facility] [address], [place], [place], [address] Phone: [phone]   Fax:  [phone]  Physical Therapy Treatment  Patient Details  Name: [name] MRN: [id number] Date of Birth: Dec 04, 1940 Referring Provider (PT): [name], [place] MD   Encounter Date: 09/01/2020   PT End of Session - 09/01/20 1235     Visit Number 7    Date for PT Re-Evaluation 09/29/20    Authorization Type UHC medicare    PT Start Time 1231    PT Stop Time 3419    PT Time Calculation (min) 44 min    Activity Tolerance Patient tolerated treatment well    Behavior During Therapy [facility] for tasks assessed/performed             Past Medical History:  Diagnosis Date   Anemia    Asthma    Chest pain, atypical    Diabetes mellitus    GERD (gastroesophageal reflux disease)    History of transient ischemic attack (TIA)    Hyperlipidemia    Hypertension    OA (osteoarthritis)    Thyroid carcinoma ([place])     Past Surgical History:  Procedure Laterality Date   BACK SURGERY     BIOPSY  03/14/2018   Procedure: BIOPSY;  Surgeon: Wilford Corner, MD;  [facility];  Service: Endoscopy;;  EGD and Colon   BREAST BIOPSY     CARDIAC CATHETERIZATION  03/18/2009   NORMAL LEFT VENTRICULAR SIZE AND CONTRACTILITY WITH NORMAL SYSTOLIC  FUNCTION. EF 60%   CARDIOLITE STUDY     SHOWED A SIGNIFICANT REVERSIBLE ANTERIOR WALL DEFECT CONSISTENT WITH ISCHEMIA. EF 55%   COLONOSCOPY WITH PROPOFOL N/A 03/14/2018   Procedure: COLONOSCOPY WITH PROPOFOL;  Surgeon: Wilford Corner, MD;  [facility];  Service: Endoscopy;  Laterality: N/A;   ESOPHAGOGASTRODUODENOSCOPY (EGD) WITH PROPOFOL N/A 03/14/2018   Procedure: ESOPHAGOGASTRODUODENOSCOPY (EGD) WITH PROPOFOL;  Surgeon: Wilford Corner, MD;  [facility];  Service: Endoscopy;  Laterality: N/A;   FEMUR IM NAIL Right 04/25/2015   Procedure: INTRAMEDULLARY (IM) RIGHT  RETROGRADE  FEMORAL NAILING;  Surgeon: Rod Can, MD;  [facility];  Service: Orthopedics;  Laterality: Right;   IR FL GUIDED LOC OF NEEDLE/CATH TIP FOR SPINAL INJECTION RT  04/05/2017   IR FLUORO GUIDE CV LINE RIGHT  04/08/2017   IR US GUIDE VASC ACCESS RIGHT  04/08/2017   LUMBAR FUSION     POLYPECTOMY  03/14/2018   Procedure: POLYPECTOMY;  Surgeon: Wilford Corner, MD;  [facility];  Service: Endoscopy;;   THYROIDECTOMY     TOTAL KNEE ARTHROPLASTY     right    There were no vitals filed for this visit.   Subjective Assessment - 09/01/20 1237     Subjective My pain awlays comes back, and comes back high. I want to finish PT this week bc it just isn't "sticking."    Pertinent History lumbar surgery Dr [name]- > 5 years,  walker use for all mobility, thyroid cancer, osteoporosis, CVA 2009, Rt sided weakness    How long can you walk comfortably? walking limited by mobility, not LBP- 1-2 minutes now due to LBP, prior to onset of pain, could walk 5-10 min    Diagnostic tests MRI 2019- infection on spine- had to have IV    Patient Stated Goals reduce LBP, return to prior level of function    Currently in Pain? Yes    Pain Score 9     Pain Location Back  Pain Orientation Lower    Pain Descriptors / Indicators Sharp;Throbbing    Aggravating Factors  standing and walking    Pain Relieving Factors sitting, pain meds    Multiple Pain Sites No                               [facility] Adult PT Treatment/Exercise - 09/01/20 0001       Lumbar Exercises: Stretches   Other Lumbar Stretch Exercise seated lumbar flexion ball rollouts 10x3" holds      Lumbar Exercises: Aerobic   Nustep L4 (per Pt request) x 15' (per Pt request) with lumbar heat, seat 9, arms 10, PTA present to monitor and discuss progress      Knee/Hip Exercises: Standing   Hip Flexion 20 reps;Knee bent    Hip Flexion Limitations bil UE support    Gait Training weight shifting 3-way (lateral, stagger  Rt/Lt)      Modalities   Modalities Moist Heat      Moist Heat Therapy   Number Minutes Moist Heat 15 Minutes    Moist Heat Location Lumbar Spine      Electrical Stimulation   Electrical Stimulation Location Lower lumbar    Electrical Stimulation Action IFC    Electrical Stimulation Parameters 15 MIN    Electrical Stimulation Goals Pain                      PT Short Term Goals - 08/04/20 1603       PT SHORT TERM GOAL #1   Title be independent in initial HEP    Time 4    Period Weeks    Status New    Target Date 09/01/20      PT SHORT TERM GOAL #2   Title report a > or = to 30% reduction in LBP with standing and walking    Time 4    Period Weeks    Status New    Target Date 09/01/20      PT SHORT TERM GOAL #3   Title reduce LBP to allow for standing and walking > or = to 5 minutes without limitation    Time 4    Period Weeks    Status New    Target Date 09/01/20      PT SHORT TERM GOAL #4   Title ----      PT SHORT TERM GOAL #5   Title -----               PT Long Term Goals - 08/04/20 1604       PT LONG TERM GOAL #1   Title The patient will be independent with safe self progression of HEP    Time 8    Period Weeks    Status New    Target Date 09/29/20      PT LONG TERM GOAL #2   Title increase FOTO to > = to 53 to improve function    Time 8    Period Weeks    Status New    Target Date 09/29/20      PT LONG TERM GOAL #3   Title stand and walk with erect posture due to reduced LBP    Time 8    Period Weeks    Status New    Target Date 09/29/20      PT LONG TERM GOAL #4   Title stand and  walk for 5-10 minutes without limitation due to LBP to improve function and independence    Time 8    Period Weeks    Status New    Target Date 09/29/20      PT LONG TERM GOAL #5   Title report < or = to 4/10 max LBP with standing and walking    Time 8    Period Weeks    Status New    Target Date 09/29/20                    Plan - 09/01/20 1235     Clinical Impression Statement Pt arrives in high complaints of low central back pain. She feels she is not getting much pain relief that 'sticks" in PT and would like to finish this week.    Personal Factors and Comorbidities Comorbidity 3+    Comorbidities lumbar laminectomy, thyroid cancer, walker use x 10 years    Examination-Activity Limitations Carry;Locomotion Level;Stairs;Stand;Transfers    Examination-Participation Restrictions Community Activity;Meal Prep;Cleaning    Stability/Clinical Decision Making Evolving/Moderate complexity    Rehab Potential Good    PT Frequency 2x / week    PT Duration 8 weeks    PT Treatment/Interventions ADLs/Self Care Home Management;Cryotherapy;Electrical Stimulation;Gait training;Stair training;Functional mobility training;Therapeutic activities;Therapeutic exercise;Neuromuscular re-education;Balance training;Patient/family education;Manual techniques;Dry needling;Taping;Spinal Manipulations;Joint Manipulations    PT Next Visit Plan DC nect session per pt request    PT Home Exercise Plan Access Code: [email or web address]    Consulted and Agree with Plan of Care Patient             Patient will benefit from skilled therapeutic intervention in order to improve the following deficits and impairments:  Decreased range of motion, Difficulty walking, Decreased endurance, Increased muscle spasms, Decreased activity tolerance, Pain, Improper body mechanics, Impaired flexibility, Decreased balance, Postural dysfunction  Visit Diagnosis: Muscle weakness (generalized)  Other abnormalities of gait and mobility  Pain in thoracic spine  Cramp and spasm  Chronic left-sided low back pain with left-sided sciatica     Problem List Patient Active Problem List   Diagnosis Date Noted   Age-related osteoporosis without current pathological fracture 06/13/2020   CVA, old, speech/language deficit 06/13/2020   Generalized abdominal pain 06/13/2020    Long term (current) use of insulin ([place]) 06/13/2020   Osteoporosis without current pathological fracture 06/13/2020   Pure hypercholesterolemia 06/13/2020   Pain in joint of right shoulder 06/26/2019   Iron deficiency anemia, unspecified 03/14/2018   Chronic pain syndrome 11/04/2017   Thoracic radiculopathy 11/04/2017   Pes anserinus bursitis of right knee 08/12/2017   GERD without esophagitis 05/12/2017   Pleuritic chest pain 05/04/2017   Medication monitoring encounter 05/04/2017   Dyslipidemia associated with type 2 diabetes mellitus ([place]) 04/20/2017   Hypertension associated with diabetes ([place]) 04/20/2017   Cerebrovascular accident (CVA) with involvement of right side of body ([place]) 04/20/2017   Peripheral autonomic neuropathy due to diabetes mellitus ([place]) 04/20/2017   Hypertensive renal disease with renal failure, stage 1-4 or unspecified chronic kidney disease 04/20/2017   Constipation 04/06/2017   Status post lumbar spinal fusion 04/03/2017   Recurrent UTI 04/03/2017   Chronic renal insufficiency 04/03/2017   Normocytic anemia 04/03/2017   Osteomyelitis of thoracic spine ([place]) 04/02/2017   Thoracic discitis 04/02/2017   DDD (degenerative disc disease), lumbar 12/31/2016   Narcotic dependence ([place]) 10/24/2015   Periprosthetic fracture around internal prosthetic right knee joint 04/25/2015   Fracture, femur, distal, right, closed, initial encounter 04/24/2015  Closed fracture of distal end of right femur, initial encounter ([place]) 04/24/2015   Acute gout 10/17/2013   Hypoparathyroidism ([place]) 03/02/2012   Benign essential tremor 09/28/2010   Combined hyperlipidemia associated with type 2 diabetes mellitus ([place]) 09/26/2009   Osteopenia 09/26/2009   Postoperative hypothyroidism 09/26/2009   Multiple pulmonary nodules 08/22/2009   OSTEOARTHRITIS, KNEES, BILATERAL 08/07/2009   Allergic rhinitis 11/19/2008   History of thyroid cancer 09/26/2008   Spinal stenosis of lumbar  region 03/26/2008   Hypothyroidism 07/03/2007   Insulin dependent type 2 diabetes mellitus, controlled ([place]) 07/03/2007   Spinal epidural abscess 07/03/2007   Benign essential HTN 07/03/2007   PSOAS MUSCLE ABSCESS 06/09/2007    Tameaka Eichhorn, PTA 09/01/2020, 1:01 PM  [facility] [address], [place], [place], [address] Phone: [phone]   Fax:  [phone]  Name: AMIR GLAUS MRN: [id number] Date of Birth: 1940-09-16

## 2020-09-03 ENCOUNTER — Encounter: Payer: Self-pay | Admitting: Physical Therapy

## 2020-09-03 ENCOUNTER — Other Ambulatory Visit: Payer: Self-pay

## 2020-09-03 ENCOUNTER — Ambulatory Visit: Payer: Medicare Other | Admitting: Physical Therapy

## 2020-09-03 DIAGNOSIS — G8929 Other chronic pain: Secondary | ICD-10-CM | POA: Diagnosis not present

## 2020-09-03 DIAGNOSIS — M546 Pain in thoracic spine: Secondary | ICD-10-CM

## 2020-09-03 DIAGNOSIS — M6281 Muscle weakness (generalized): Secondary | ICD-10-CM | POA: Diagnosis not present

## 2020-09-03 DIAGNOSIS — M5442 Lumbago with sciatica, left side: Secondary | ICD-10-CM | POA: Diagnosis not present

## 2020-09-03 DIAGNOSIS — R252 Cramp and spasm: Secondary | ICD-10-CM

## 2020-09-03 DIAGNOSIS — R2689 Other abnormalities of gait and mobility: Secondary | ICD-10-CM | POA: Diagnosis not present

## 2020-09-03 NOTE — Therapy (Addendum)
[facility] [address], [place], [place], [address] Phone: [phone]   Fax:  [phone]  Physical Therapy Treatment  Patient Details  Name: [name] MRN: [id number] Date of Birth: [birth year] Referring Provider (PT): [name], [place] MD   Encounter Date: 09/03/2020   PT End of Session - 09/03/20 1448     Visit Number 8    Date for PT Re-Evaluation 10/29/20    Authorization Type UHC medicare    PT Start Time 4401    PT Stop Time 1535    PT Time Calculation (min) 52 min    Activity Tolerance Patient tolerated treatment well    Behavior During Therapy [facility] for tasks assessed/performed             Past Medical History:  Diagnosis Date   Anemia    Asthma    Chest pain, atypical    Diabetes mellitus    GERD (gastroesophageal reflux disease)    History of transient ischemic attack (TIA)    Hyperlipidemia    Hypertension    OA (osteoarthritis)    Thyroid carcinoma ([place])     Past Surgical History:  Procedure Laterality Date   BACK SURGERY     BIOPSY  03/14/2018   Procedure: BIOPSY;  Surgeon: Wilford Corner, MD;  [facility];  Service: Endoscopy;;  EGD and Colon   BREAST BIOPSY     CARDIAC CATHETERIZATION  03/18/2009   NORMAL LEFT VENTRICULAR SIZE AND CONTRACTILITY WITH NORMAL SYSTOLIC  FUNCTION. EF 60%   CARDIOLITE STUDY     SHOWED A SIGNIFICANT REVERSIBLE ANTERIOR WALL DEFECT CONSISTENT WITH ISCHEMIA. EF 55%   COLONOSCOPY WITH PROPOFOL N/A 03/14/2018   Procedure: COLONOSCOPY WITH PROPOFOL;  Surgeon: Wilford Corner, MD;  [facility];  Service: Endoscopy;  Laterality: N/A;   ESOPHAGOGASTRODUODENOSCOPY (EGD) WITH PROPOFOL N/A 03/14/2018   Procedure: ESOPHAGOGASTRODUODENOSCOPY (EGD) WITH PROPOFOL;  Surgeon: Wilford Corner, MD;  [facility];  Service: Endoscopy;  Laterality: N/A;   FEMUR IM NAIL Right 04/25/2015   Procedure: INTRAMEDULLARY (IM) RIGHT  RETROGRADE  FEMORAL NAILING;  Surgeon: Rod Can, MD;  [facility];  Service: Orthopedics;  Laterality: Right;   IR FL GUIDED LOC OF NEEDLE/CATH TIP FOR SPINAL INJECTION RT  04/05/2017   IR FLUORO GUIDE CV LINE RIGHT  04/08/2017   IR US GUIDE VASC ACCESS RIGHT  04/08/2017   LUMBAR FUSION     POLYPECTOMY  03/14/2018   Procedure: POLYPECTOMY;  Surgeon: Wilford Corner, MD;  [facility];  Service: Endoscopy;;   THYROIDECTOMY     TOTAL KNEE ARTHROPLASTY     right    There were no vitals filed for this visit.   Subjective Assessment - 09/03/20 1450     Subjective My back is feeling better.    Pertinent History lumbar surgery Dr [name]- > 5 years,  walker use for all mobility, thyroid cancer, osteoporosis, CVA 2009, Rt sided weakness    How long can you walk comfortably? walking limited by mobility, not LBP- 1-2 minutes now due to LBP, prior to onset of pain, could walk 5-10 min    Diagnostic tests MRI 2019- infection on spine- had to have IV    Patient Stated Goals reduce LBP, return to prior level of function    Pain Score 5     Pain Location Back    Pain Orientation Lower    Pain Descriptors / Indicators S[place]  [facility] PT Assessment - 09/03/20 0001       Assessment   Medical Diagnosis Rt upper thoracic pain, discitis of thoracic region    Referring Provider (PT) [name], [name] MD    Onset Date/Surgical Date 08/05/10      [place] Level entry    Additional Comments [place]      Prior Function   Level of [place] device for independence;Needs assistance with ADLs;Needs assistance with homemaking      Cognition   Overall Cognitive Status Within Functional Limits for tasks assessed      Observation/Other Assessments   Focus on Therapeutic Outcomes (FOTO)  34 (53 is goal)      Posture/Postural  Control   Posture/Postural Control Postural limitations    Postural Limitations Flexed trunk;Forward head      Strength   Overall Strength Deficits    Overall Strength Comments 4/5 bil hips, 4+/5 bil knees      Ambulation/Gait   Ambulation/Gait Yes    Ambulation/Gait Assistance 6: Modified independent (Device/Increase time)    Assistive device Rolling walker    Gait Pattern Trunk flexed;Lateral trunk lean to right;Antalgic                           [facility] Adult PT Treatment/Exercise - 09/03/20 0001       Lumbar Exercises: Stretches   Other Lumbar Stretch Exercise seated lumbar flexion ball rollouts 10x3" holds      Lumbar Exercises: Aerobic   Nustep L4 (per Pt request) x 15' (per Pt request) with lumbar heat, seat 9, arms 10, PTA present to monitor and discuss progress      Knee/Hip Exercises: Standing   Hip Flexion 20 reps;Knee bent    Hip Flexion Limitations bil UE support      Modalities   Modalities Moist Heat      Moist Heat Therapy   Number Minutes Moist Heat 15 Minutes    Moist Heat Location Lumbar Spine      Electrical Stimulation   Electrical Stimulation Location Lower lumbar    Electrical Stimulation Action IFC    Electrical Stimulation Parameters 10 min    Electrical Stimulation Goals Pain                      PT Short Term Goals - 08/04/20 1603       PT SHORT TERM GOAL #1   Title be independent in initial HEP    Time 4    Period Weeks    Status New    Target Date 09/01/20      PT SHORT TERM GOAL #2   Title report a > or = to 30% reduction in LBP with standing and walking    Time 4    Period Weeks    Status New    Target Date 09/01/20      PT SHORT TERM GOAL #3   Title reduce LBP to allow for standing and walking > or = to 5 minutes without limitation    Time 4    Period Weeks    Status New    Target Date 09/01/20      PT SHORT TERM GOAL #4   Title ----      PT SHORT TERM GOAL #5  Title -----                PT Long Term Goals - 09/03/20 1854       PT LONG TERM GOAL #1   Title The patient will be independent with safe self progression of HEP    Time 8    Period Weeks    Status On-going    Target Date 10/29/20      PT LONG TERM GOAL #2   Title increase FOTO to > = to 53 to improve function    Time 8    Period Weeks    Status On-going    Target Date 10/29/20      PT LONG TERM GOAL #3   Title stand and walk with erect posture due to reduced LBP    Baseline pain levels have been variable due to chronic nature of condition.  Improved awareness with postural alignment    Time 8    Period Weeks    Status New    Target Date 10/29/20      PT LONG TERM GOAL #4   Title stand and walk for 5-10 minutes without limitation due to LBP to improve function and independence    Time 8    Period Weeks    Status On-going      PT LONG TERM GOAL #5   Title report < or = to 4/10 max LBP with standing and walking    Time 8    Period Weeks    Status On-going    Target Date 10/29/20                   Plan - 09/03/20 1451     Clinical Impression Statement Pt changed her mind about finishing PT today. Pt reports on Monday she felt discouraged and  hopeless due to her pain. Since Monday her pain has been much lower and managable so she would like to continue her POC out. Pt reports she has a strong interest in doing aquatic PT. She previously exercised in the water at the [facility] pre-Cov[facility] and enjoys it immensely. She would like to add aqautics 1x week if agreed upon by PT. Pt would benefit from buoyancy principle off loading pt's joints when moving.  Pt has had slow progress due to complexity and chronicity of medical condition and variable pain levels.    Personal Factors and Comorbidities Comorbidity 3+    Comorbidities lumbar laminectomy, thyroid cancer, walker use x 10 years    Examination-Activity Limitations Carry;Locomotion Level;Stairs;Stand;Transfers    Examination-Participation  Restrictions Community Activity;Meal Prep;Cleaning    Stability/Clinical Decision Making Evolving/Moderate complexity    Rehab Potential Good    PT Frequency 2x / week    PT Duration 8 weeks    PT Treatment/Interventions ADLs/Self Care Home Management;Cryotherapy;Electrical Stimulation;Gait training;Stair training;Functional mobility training;Therapeutic activities;Therapeutic exercise;Neuromuscular re-education;Balance training;Patient/family education;Manual techniques;Dry needling;Taping;Spinal Manipulations;Joint Manipulations;Aquatic Therapy    PT Next Visit Plan begin aquatics next week to improve pt's ability to exercise while offloading her joints.  Pt will continue 1x/wk with land based PT and 1x in the pool each week.    PT Home Exercise Plan Access Code: [email or web address]    Consulted and Agree with Plan of Care Patient             Patient will benefit from skilled therapeutic intervention in order to improve the following deficits and impairments:  Decreased range of motion, Difficulty walking, Decreased endurance, Increased muscle spasms, Decreased activity tolerance, Pain,  Improper body mechanics, Impaired flexibility, Decreased balance, Postural dysfunction  Visit Diagnosis: Muscle weakness (generalized) - Plan: PT plan of care cert/re-cert  Other abnormalities of gait and mobility - Plan: PT plan of care cert/re-cert  Pain in thoracic spine - Plan: PT plan of care cert/re-cert  Cramp and spasm - Plan: PT plan of care cert/re-cert     Problem List Patient Active Problem List   Diagnosis Date Noted   Age-related osteoporosis without current pathological fracture 06/13/2020   CVA, old, speech/language deficit 06/13/2020   Generalized abdominal pain 06/13/2020   Long term (current) use of insulin ([place]) 06/13/2020   Osteoporosis without current pathological fracture 06/13/2020   Pure hypercholesterolemia 06/13/2020   Pain in joint of right shoulder 06/26/2019   Iron  deficiency anemia, unspecified 03/14/2018   Chronic pain syndrome 11/04/2017   Thoracic radiculopathy 11/04/2017   Pes anserinus bursitis of right knee 08/12/2017   GERD without esophagitis 05/12/2017   Pleuritic chest pain 05/04/2017   Medication monitoring encounter 05/04/2017   Dyslipidemia associated with type 2 diabetes mellitus ([place]) 04/20/2017   Hypertension associated with diabetes ([place]) 04/20/2017   Cerebrovascular accident (CVA) with involvement of right side of body ([place]) 04/20/2017   Peripheral autonomic neuropathy due to diabetes mellitus ([place]) 04/20/2017   Hypertensive renal disease with renal failure, stage 1-4 or unspecified chronic kidney disease 04/20/2017   Constipation 04/06/2017   Status post lumbar spinal fusion 04/03/2017   Recurrent UTI 04/03/2017   Chronic renal insufficiency 04/03/2017   Normocytic anemia 04/03/2017   Osteomyelitis of thoracic spine ([place]) 04/02/2017   Thoracic discitis 04/02/2017   DDD (degenerative disc disease), lumbar 12/31/2016   Narcotic dependence ([place]) 10/24/2015   Periprosthetic fracture around internal prosthetic right knee joint 04/25/2015   Fracture, femur, distal, right, closed, initial encounter 04/24/2015   Closed fracture of distal end of right femur, initial encounter ([place]) 04/24/2015   Acute gout 10/17/2013   Hypoparathyroidism ([place]) 03/02/2012   Benign essential tremor 09/28/2010   Combined hyperlipidemia associated with type 2 diabetes mellitus ([place]) 09/26/2009   Osteopenia 09/26/2009   Postoperative hypothyroidism 09/26/2009   Multiple pulmonary nodules 08/22/2009   OSTEOARTHRITIS, KNEES, BILATERAL 08/07/2009   Allergic rhinitis 11/19/2008   History of thyroid cancer 09/26/2008   Spinal stenosis of lumbar region 03/26/2008   Hypothyroidism 07/03/2007   Insulin dependent type 2 diabetes mellitus, controlled ([place]) 07/03/2007   Spinal epidural abscess 07/03/2007   Benign essential HTN 07/03/2007   PSOAS MUSCLE ABSCESS  06/09/2007      Antawan Mchugh, PTA 09/03/2020, 3:30 PM Sigurd Sos, PT 09/03/20 6:58 PM   [facility] [address] [place], [place], [address] Phone: [phone]   Fax:  [phone]  Name: [name] MRN: [id number] Date of Birth: [birth year]

## 2020-09-03 NOTE — Patient Instructions (Signed)
      [facility] Welcome to [facility]! Here you will find all the information you will need regarding your pool therapy. If you have further questions at any time, please call our office at [phone]. After completing your initial evaluation in the [facility], you may be eligible to complete a portion of your therapy in the pool. A typical week of therapy will consist of 1-2 typical physical therapy visits at our [facility] location and an additional session of therapy in the pool located at the [facility] at [address]. [address], [place] [address]. The phone number at the pool site is [phone]. Please call this number if you are running late or need to cancel your appointment.  Aquatic therapy will be offered on Friday afternoons. Each session will last approximately 45 minutes. All scheduling and payments for aquatic therapy sessions, including cancelations, will be done through our [facility] location.  To be eligible for aquatic therapy, these criteria must be met: You must be able to independently change in the locker room and get to the pool deck. A caregiver can come with you to help if needed. There are bleachers for a caregiver to sit on next to the pool. No one with an open wound is permitted in the pool.  Handicap parking is available in the front and there is a drop off option for even closer accessibility. Please arrive 15 minutes prior to your appointment to prepare for your pool session. You must sign in at the front desk upon your arrival. Please be sure to attend to any toileting needs prior to entering the pool. Locker rooms for changing are located to the right of the check-in desk. There is direct access to the pool deck from the locker room. You can lock your belongings in a locker but must bring a lock. Your therapist will greet you on the pool deck. There may be other swimmers in the pool at the  same time but your session is one-on-one with the therapist.   

## 2020-09-03 NOTE — Addendum Note (Signed)
Addended by: Danie Binder on: 09/03/2020 06:58 PM   Modules accepted: Orders

## 2020-09-10 ENCOUNTER — Encounter: Payer: Self-pay | Admitting: Physical Therapy

## 2020-09-10 ENCOUNTER — Other Ambulatory Visit: Payer: Self-pay

## 2020-09-10 ENCOUNTER — Encounter: Payer: Medicare Other | Admitting: Physical Therapy

## 2020-09-10 ENCOUNTER — Ambulatory Visit: Payer: Medicare Other | Attending: Family Medicine | Admitting: Physical Therapy

## 2020-09-10 DIAGNOSIS — R2689 Other abnormalities of gait and mobility: Secondary | ICD-10-CM | POA: Insufficient documentation

## 2020-09-10 DIAGNOSIS — M5442 Lumbago with sciatica, left side: Secondary | ICD-10-CM | POA: Diagnosis not present

## 2020-09-10 DIAGNOSIS — M6281 Muscle weakness (generalized): Secondary | ICD-10-CM | POA: Insufficient documentation

## 2020-09-10 DIAGNOSIS — G8929 Other chronic pain: Secondary | ICD-10-CM | POA: Insufficient documentation

## 2020-09-10 DIAGNOSIS — R252 Cramp and spasm: Secondary | ICD-10-CM | POA: Diagnosis not present

## 2020-09-10 DIAGNOSIS — M546 Pain in thoracic spine: Secondary | ICD-10-CM | POA: Diagnosis not present

## 2020-09-10 NOTE — Therapy (Signed)
[facility] [address] [place], [place], [address] Phone: [phone]   Fax:  [phone]  Physical Therapy Treatment  Patient Details  Name: [name] MRN: [id number] Date of Birth: 02/23/1940 Referring Provider (PT): [name], [place] MD   Encounter Date: 09/10/2020   PT End of Session - 09/10/20 1231     Visit Number 9    Date for PT Re-Evaluation 10/29/20    Authorization Type UHC medicare    PT Start Time 1030    PT Stop Time 1115    PT Time Calculation (min) 45 min    Activity Tolerance Patient tolerated treatment well    Behavior During Therapy [facility] for tasks assessed/performed             Past Medical History:  Diagnosis Date   Anemia    Asthma    Chest pain, atypical    Diabetes mellitus    GERD (gastroesophageal reflux disease)    History of transient ischemic attack (TIA)    Hyperlipidemia    Hypertension    OA (osteoarthritis)    Thyroid carcinoma ([place])     Past Surgical History:  Procedure Laterality Date   BACK SURGERY     BIOPSY  03/14/2018   Procedure: BIOPSY;  Surgeon: Wilford Corner, MD;  Location: [facility];  Service: Endoscopy;;  EGD and Colon   BREAST BIOPSY     CARDIAC CATHETERIZATION  03/18/2009   NORMAL LEFT VENTRICULAR SIZE AND CONTRACTILITY WITH NORMAL SYSTOLIC  FUNCTION. EF 60%   CARDIOLITE STUDY     SHOWED A SIGNIFICANT REVERSIBLE ANTERIOR WALL DEFECT CONSISTENT WITH ISCHEMIA. EF 55%   COLONOSCOPY WITH PROPOFOL N/A 03/14/2018   Procedure: COLONOSCOPY WITH PROPOFOL;  Surgeon: Wilford Corner, MD;  [facility];  Service: Endoscopy;  Laterality: N/A;   ESOPHAGOGASTRODUODENOSCOPY (EGD) WITH PROPOFOL N/A 03/14/2018   Procedure: ESOPHAGOGASTRODUODENOSCOPY (EGD) WITH PROPOFOL;  Surgeon: Wilford Corner, MD;  [facility];  Service: Endoscopy;  Laterality: N/A;   FEMUR IM NAIL Right 04/25/2015   Procedure: INTRAMEDULLARY (IM) RIGHT  RETROGRADE  FEMORAL NAILING;  Surgeon: Rod Can, MD;  [facility];  Service: Orthopedics;  Laterality: Right;   IR FL GUIDED LOC OF NEEDLE/CATH TIP FOR SPINAL INJECTION RT  04/05/2017   IR FLUORO GUIDE CV LINE RIGHT  04/08/2017   IR US GUIDE VASC ACCESS RIGHT  04/08/2017   LUMBAR FUSION     POLYPECTOMY  03/14/2018   Procedure: POLYPECTOMY;  Surgeon: Wilford Corner, MD;  [facility];  Service: Endoscopy;;   THYROIDECTOMY     TOTAL KNEE ARTHROPLASTY     right    There were no vitals filed for this visit.   Subjective Assessment - 09/10/20 1229     Subjective My back is hurting today. i couldn't sleep last night because i was so excited about being in the pool today.    Pertinent History lumbar surgery Dr [name]- > 5 years,  walker use for all mobility, thyroid cancer, osteoporosis, CVA 2009, Rt sided weakness    Patient Stated Goals reduce LBP, return to prior level of function    Currently in Pain? Yes    Pain Score 5     Pain Location Back    Pain Orientation Lower    Pain Descriptors / Indicators Sore    Aggravating Factors  standing and walking    Pain Relieving Factors sitting, meds, TENS, stretching, water exercises    Multiple Pain Sites No  Treatment: Patient seen for aquatic therapy today.  Treatment took place in water 2.5-4 feet deep depending upon activity.  Pt entered the pool via stairs, step to step slowly with heavy use of railings. Water temp 94 degrees F. Pt requires buoyancy of water for support and to offload joints with strengthening exercises.    Seated water bench with 75% submersion Pt performed seated LE AROM exercises 20x in all planes, added 2# to LAQ 10x Bil, abdominal compressions for core strength 10x 5 sec hold, pain assessment concurrent.  Standing in 75% depth: Water walking with large noodle 4-6 lengths 3 bouts with seated rest break in between. Mini squats 15x, hip abd 10x bil alternating, heel raises 15x MArching 15x: both  holding on the side of the pool                         Upper Extremity Functional Index Score :   /80     PT Short Term Goals - 08/04/20 1603       PT SHORT TERM GOAL #1   Title be independent in initial HEP    Time 4    Period Weeks    Status New    Target Date 09/01/20      PT SHORT TERM GOAL #2   Title report a > or = to 30% reduction in LBP with standing and walking    Time 4    Period Weeks    Status New    Target Date 09/01/20      PT SHORT TERM GOAL #3   Title reduce LBP to allow for standing and walking > or = to 5 minutes without limitation    Time 4    Period Weeks    Status New    Target Date 09/01/20      PT SHORT TERM GOAL #4   Title ----      PT SHORT TERM GOAL #5   Title -----               PT Long Term Goals - 09/03/20 1854       PT LONG TERM GOAL #1   Title The patient will be independent with safe self progression of HEP    Time 8    Period Weeks    Status On-going    Target Date 10/29/20      PT LONG TERM GOAL #2   Title increase FOTO to > = to 53 to improve function    Time 8    Period Weeks    Status On-going    Target Date 10/29/20      PT LONG TERM GOAL #3   Title stand and walk with erect posture due to reduced LBP    Baseline pain levels have been variable due to chronic nature of condition.  Improved awareness with postural alignment    Time 8    Period Weeks    Status New    Target Date 10/29/20      PT LONG TERM GOAL #4   Title stand and walk for 5-10 minutes without limitation due to LBP to improve function and independence    Time 8    Period Weeks    Status On-going      PT LONG TERM GOAL #5   Title report < or = to 4/10 max LBP with standing and walking    Time 8    Period Weeks  Status On-going    Target Date 10/29/20                   Plan - 09/10/20 1232     Clinical Impression Statement Pt arrives today for her first aquatic PT session. Pt consistently verbally  report how much she enjoys the water and how much easier it is to move and exercise. Pt has previous water exercise experience and plans to return after she completes her therapy. Pt was initially nervous moving in the water but became accustomed with in 10-15 min and could move more freely. Pt completed a level 1 program with no issues and denied any back pain throughout the session.    Personal Factors and Comorbidities Comorbidity 3+    Comorbidities lumbar laminectomy, thyroid cancer, walker use x 10 years    Examination-Activity Limitations Carry;Locomotion Level;Stairs;Stand;Transfers    Examination-Participation Restrictions Community Activity;Meal Prep;Cleaning    Stability/Clinical Decision Making Evolving/Moderate complexity    Rehab Potential Good    PT Frequency 2x / week    PT Duration 8 weeks    PT Treatment/Interventions ADLs/Self Care Home Management;Cryotherapy;Electrical Stimulation;Gait training;Stair training;Functional mobility training;Therapeutic activities;Therapeutic exercise;Neuromuscular re-education;Balance training;Patient/family education;Manual techniques;Dry needling;Taping;Spinal Manipulations;Joint Manipulations;Aquatic Therapy    PT Next Visit Plan See how pt did in the pool    PT Home Exercise Plan Access Code: [email or web address]    Consulted and Agree with Plan of Care Patient             Patient will benefit from skilled therapeutic intervention in order to improve the following deficits and impairments:  Decreased range of motion, Difficulty walking, Decreased endurance, Increased muscle spasms, Decreased activity tolerance, Pain, Improper body mechanics, Impaired flexibility, Decreased balance, Postural dysfunction  Visit Diagnosis: Muscle weakness (generalized)  Other abnormalities of gait and mobility  Cramp and spasm     Problem List Patient Active Problem List   Diagnosis Date Noted   Age-related osteoporosis without current pathological fracture  06/13/2020   CVA, old, speech/language deficit 06/13/2020   Generalized abdominal pain 06/13/2020   Long term (current) use of insulin ([place]) 06/13/2020   Osteoporosis without current pathological fracture 06/13/2020   Pure hypercholesterolemia 06/13/2020   Pain in joint of right shoulder 06/26/2019   Iron deficiency anemia, unspecified 03/14/2018   Chronic pain syndrome 11/04/2017   Thoracic radiculopathy 11/04/2017   Pes anserinus bursitis of right knee 08/12/2017   GERD without esophagitis 05/12/2017   Pleuritic chest pain 05/04/2017   Medication monitoring encounter 05/04/2017   Dyslipidemia associated with type 2 diabetes mellitus ([place]) 04/20/2017   Hypertension associated with diabetes ([place]) 04/20/2017   Cerebrovascular accident (CVA) with involvement of right side of body ([place]) 04/20/2017   Peripheral autonomic neuropathy due to diabetes mellitus ([place]) 04/20/2017   Hypertensive renal disease with renal failure, stage 1-4 or unspecified chronic kidney disease 04/20/2017   Constipation 04/06/2017   Status post lumbar spinal fusion 04/03/2017   Recurrent UTI 04/03/2017   Chronic renal insufficiency 04/03/2017   Normocytic anemia 04/03/2017   Osteomyelitis of thoracic spine ([place]) 04/02/2017   Thoracic discitis 04/02/2017   DDD (degenerative disc disease), lumbar 12/31/2016   Narcotic dependence ([place]) 10/24/2015   Periprosthetic fracture around internal prosthetic right knee joint 04/25/2015   Fracture, femur, distal, right, closed, initial encounter 04/24/2015   Closed fracture of distal end of right femur, initial encounter ([place]) 04/24/2015   Acute gout 10/17/2013   Hypoparathyroidism ([place]) 03/02/2012   Benign essential tremor 09/28/2010   Combined hyperlipidemia associated  with type 2 diabetes mellitus ([place]) 09/26/2009   Osteopenia 09/26/2009   Postoperative hypothyroidism 09/26/2009   Multiple pulmonary nodules 08/22/2009   OSTEOARTHRITIS, KNEES, BILATERAL 08/07/2009    Allergic rhinitis 11/19/2008   History of thyroid cancer 09/26/2008   Spinal stenosis of lumbar region 03/26/2008   Hypothyroidism 07/03/2007   Insulin dependent type 2 diabetes mellitus, controlled ([place]) 07/03/2007   Spinal epidural abscess 07/03/2007   Benign essential HTN 07/03/2007   PSOAS MUSCLE ABSCESS 06/09/2007    Tatyanna Cronk, PTA 09/10/2020, 12:36 PM  [facility] [address] [place], [place], [address] Phone: [phone]   Fax:  [phone]  Name: [name] MRN: [id number] Date of Birth: [birth year]

## 2020-09-12 ENCOUNTER — Telehealth: Payer: Self-pay | Admitting: Physical Therapy

## 2020-09-12 ENCOUNTER — Ambulatory Visit: Payer: Medicare Other | Admitting: Physical Therapy

## 2020-09-12 NOTE — Telephone Encounter (Signed)
Pt missed PT appointment on 09/12/20 at 10:15.  Pt didn't realize she had an appointment today and confirmed she will be at next visit on 09/15/20.  Page Lancon, PT 09/12/20 10:51 AM

## 2020-09-15 ENCOUNTER — Ambulatory Visit: Payer: Medicare Other | Admitting: Physical Therapy

## 2020-09-15 ENCOUNTER — Other Ambulatory Visit: Payer: Self-pay

## 2020-09-15 ENCOUNTER — Encounter: Payer: Self-pay | Admitting: Physical Therapy

## 2020-09-15 DIAGNOSIS — M5442 Lumbago with sciatica, left side: Secondary | ICD-10-CM | POA: Diagnosis not present

## 2020-09-15 DIAGNOSIS — G8929 Other chronic pain: Secondary | ICD-10-CM | POA: Diagnosis not present

## 2020-09-15 DIAGNOSIS — R2689 Other abnormalities of gait and mobility: Secondary | ICD-10-CM | POA: Diagnosis not present

## 2020-09-15 DIAGNOSIS — M546 Pain in thoracic spine: Secondary | ICD-10-CM

## 2020-09-15 DIAGNOSIS — R252 Cramp and spasm: Secondary | ICD-10-CM

## 2020-09-15 DIAGNOSIS — M6281 Muscle weakness (generalized): Secondary | ICD-10-CM

## 2020-09-15 NOTE — Therapy (Addendum)
[facility] [address] [place], [place], [address] Phone: [phone]   Fax:  [phone]  Physical Therapy Treatment  Patient Details  Name: [name] MRN: [id number] Date of Birth: 16-Mar-1940 Referring Provider (PT): [name], [place] MD   Encounter Date: 09/15/2020 Progress Note Reporting Period 08/04/20 to 09/15/20  See note below for Objective Data and Assessment of Progress/Goals.      PT End of Session - 09/15/20 1238     Visit Number 10    Date for PT Re-Evaluation 10/29/20    Authorization Type UHC medicare    PT Start Time 9622    PT Stop Time 1312    PT Time Calculation (min) 37 min    Activity Tolerance Patient tolerated treatment well    Behavior During Therapy [facility] for tasks assessed/performed             Past Medical History:  Diagnosis Date   Anemia    Asthma    Chest pain, atypical    Diabetes mellitus    GERD (gastroesophageal reflux disease)    History of transient ischemic attack (TIA)    Hyperlipidemia    Hypertension    OA (osteoarthritis)    Thyroid carcinoma ([place])     Past Surgical History:  Procedure Laterality Date   BACK SURGERY     BIOPSY  03/14/2018   Procedure: BIOPSY;  Surgeon: Wilford Corner, MD;  [facility];  Service: Endoscopy;;  EGD and Colon   BREAST BIOPSY     CARDIAC CATHETERIZATION  03/18/2009   NORMAL LEFT VENTRICULAR SIZE AND CONTRACTILITY WITH NORMAL SYSTOLIC  FUNCTION. EF 60%   CARDIOLITE STUDY     SHOWED A SIGNIFICANT REVERSIBLE ANTERIOR WALL DEFECT CONSISTENT WITH ISCHEMIA. EF 55%   COLONOSCOPY WITH PROPOFOL N/A 03/14/2018   Procedure: COLONOSCOPY WITH PROPOFOL;  Surgeon: Wilford Corner, MD;  [facility];  Service: Endoscopy;  Laterality: N/A;   ESOPHAGOGASTRODUODENOSCOPY (EGD) WITH PROPOFOL N/A 03/14/2018   Procedure: ESOPHAGOGASTRODUODENOSCOPY (EGD) WITH PROPOFOL;  Surgeon: Wilford Corner, MD;  [facility];   Service: Endoscopy;  Laterality: N/A;   FEMUR IM NAIL Right 04/25/2015   Procedure: INTRAMEDULLARY (IM) RIGHT  RETROGRADE FEMORAL NAILING;  Surgeon: Rod Can, MD;  [facility];  Service: Orthopedics;  Laterality: Right;   IR FL GUIDED LOC OF NEEDLE/CATH TIP FOR SPINAL INJECTION RT  04/05/2017   IR FLUORO GUIDE CV LINE RIGHT  04/08/2017   IR US GUIDE VASC ACCESS RIGHT  04/08/2017   LUMBAR FUSION     POLYPECTOMY  03/14/2018   Procedure: POLYPECTOMY;  Surgeon: Wilford Corner, MD;  [facility];  Service: Endoscopy;;   THYROIDECTOMY     TOTAL KNEE ARTHROPLASTY     right    There were no vitals filed for this visit.   Subjective Assessment - 09/15/20 1239     Subjective I really loved working in the water, "I don't hurt when I am in the water."    Pertinent History lumbar surgery Dr [name]- > 5 years,  walker use for all mobility, thyroid cancer, osteoporosis, CVA 2009, Rt sided weakness    Currently in Pain? Yes    Pain Score 8     Pain Location Back    Pain Orientation Lower    Pain Descriptors / Indicators Sore    Aggravating Factors  standing and walking    Pain Relieving Factors water    Multiple Pain Sites No  [facility] PT Assessment - 09/15/20 0001       Assessment   Medical Diagnosis Rt upper thoracic pain, discitis of thoracic region    Referring Provider (PT) [name], [name] MD    Onset Date/Surgical Date 08/05/10      Observation/Other Assessments   Focus on Therapeutic Outcomes (FOTO)  Out of time      Posture/Postural Control   Posture/Postural Control Postural limitations    Postural Limitations Flexed trunk;Forward head      Strength   Overall Strength Deficits    Overall Strength Comments 4/5 bil hips, 4+/5 bil knees      Ambulation/Gait   Assistive device Rolling walker    Gait Pattern Trunk flexed;Lateral trunk lean to right;Antalgic                           [facility] Adult PT Treatment/Exercise -  09/15/20 0001       Modalities   Modalities Moist Heat      Moist Heat Therapy   Number Minutes Moist Heat 15 Minutes    Moist Heat Location Lumbar Spine      Electrical Stimulation   Electrical Stimulation Location Lower lumbar    Electrical Stimulation Action IFC    Electrical Stimulation Parameters 15 min    Electrical Stimulation Goals Pain                       PT Short Term Goals - 08/04/20 1603       PT SHORT TERM GOAL #1   Title be independent in initial HEP    Time 4    Period Weeks    Status New    Target Date 09/01/20      PT SHORT TERM GOAL #2   Title report a > or = to 30% reduction in LBP with standing and walking    Time 4    Period Weeks    Status New    Target Date 09/01/20      PT SHORT TERM GOAL #3   Title reduce LBP to allow for standing and walking > or = to 5 minutes without limitation    Time 4    Period Weeks    Status New    Target Date 09/01/20      PT SHORT TERM GOAL #4   Title ----      PT SHORT TERM GOAL #5   Title -----               PT Long Term Goals - 09/03/20 1854       PT LONG TERM GOAL #1   Title The patient will be independent with safe self progression of HEP    Time 8    Period Weeks    Status On-going    Target Date 10/29/20      PT LONG TERM GOAL #2   Title increase FOTO to > = to 53 to improve function    Time 8    Period Weeks    Status On-going    Target Date 10/29/20      PT LONG TERM GOAL #3   Title stand and walk with erect posture due to reduced LBP    Baseline pain levels have been variable due to chronic nature of condition.  Improved awareness with postural alignment    Time 8    Period Weeks    Status New  Target Date 10/29/20      PT LONG TERM GOAL #4   Title stand and walk for 5-10 minutes without limitation due to LBP to improve function and independence    Time 8    Period Weeks    Status On-going      PT LONG TERM GOAL #5   Title report < or = to 4/10 max LBP  with standing and walking    Time 8    Period Weeks    Status On-going    Target Date 10/29/20                   Plan - 09/15/20 1240     Clinical Impression Statement Pt was able to work 45 min in the pool vs what she can do on land. Per pt request and due to her significant pain reduction in the water, pt will begin aquatic sessions until the end of her POC. Next week pt plans to return to the [facility] to add her water class back in. This will provide 3 aquatic sessions for pt to exercise and build her tolerance back. Pt reported no negative effects from exercising 45 min ( no fatigue etc..) We were out of time and could do FOTO during todays session. Pt's back pain was at a higher level today. Extra time to discuss status and perform MMT ( unchanged.)    Personal Factors and Comorbidities Comorbidity 3+    Comorbidities lumbar laminectomy, thyroid cancer, walker use x 10 years    Examination-Activity Limitations Carry;Locomotion Level;Stairs;Stand;Transfers    Examination-Participation Restrictions Community Activity;Meal Prep;Cleaning    Stability/Clinical Decision Making Evolving/Moderate complexity    Rehab Potential Good    PT Frequency 2x / week    PT Duration 8 weeks    PT Treatment/Interventions ADLs/Self Care Home Management;Cryotherapy;Electrical Stimulation;Gait training;Stair training;Functional mobility training;Therapeutic activities;Therapeutic exercise;Neuromuscular re-education;Balance training;Patient/family education;Manual techniques;Dry needling;Taping;Spinal Manipulations;Joint Manipulations;Aquatic Therapy    PT Next Visit Plan Aquatics: do FOTO when pt sees PT in a week or so.    PT Home Exercise Plan Access Code: [email or web address]    Consulted and Agree with Plan of Care Patient             Patient will benefit from skilled therapeutic intervention in order to improve the following deficits and impairments:  Decreased range of motion, Difficulty walking, Decreased  endurance, Increased muscle spasms, Decreased activity tolerance, Pain, Improper body mechanics, Impaired flexibility, Decreased balance, Postural dysfunction  Visit Diagnosis: Muscle weakness (generalized)  Other abnormalities of gait and mobility  Cramp and spasm  Pain in thoracic spine  Chronic left-sided low back pain with left-sided sciatica     Problem List Patient Active Problem List   Diagnosis Date Noted   Age-related osteoporosis without current pathological fracture 06/13/2020   CVA, old, speech/language deficit 06/13/2020   Generalized abdominal pain 06/13/2020   Long term (current) use of insulin ([place]) 06/13/2020   Osteoporosis without current pathological fracture 06/13/2020   Pure hypercholesterolemia 06/13/2020   Pain in joint of right shoulder 06/26/2019   Iron deficiency anemia, unspecified 03/14/2018   Chronic pain syndrome 11/04/2017   Thoracic radiculopathy 11/04/2017   Pes anserinus bursitis of right knee 08/12/2017   GERD without esophagitis 05/12/2017   Pleuritic chest pain 05/04/2017   Medication monitoring encounter 05/04/2017   Dyslipidemia associated with type 2 diabetes mellitus ([place]) 04/20/2017   Hypertension associated with diabetes ([place]) 04/20/2017   Cerebrovascular accident (CVA) with involvement of right side of body ([place])  04/20/2017   Peripheral autonomic neuropathy due to diabetes mellitus ([place]) 04/20/2017   Hypertensive renal disease with renal failure, stage 1-4 or unspecified chronic kidney disease 04/20/2017   Constipation 04/06/2017   Status post lumbar spinal fusion 04/03/2017   Recurrent UTI 04/03/2017   Chronic renal insufficiency 04/03/2017   Normocytic anemia 04/03/2017   Osteomyelitis of thoracic spine ([place]) 04/02/2017   Thoracic discitis 04/02/2017   DDD (degenerative disc disease), lumbar 12/31/2016   Narcotic dependence ([place]) 10/24/2015   Periprosthetic fracture around internal prosthetic right knee joint 04/25/2015    Fracture, femur, distal, right, closed, initial encounter 04/24/2015   Closed fracture of distal end of right femur, initial encounter ([place]) 04/24/2015   Acute gout 10/17/2013   Hypoparathyroidism ([place]) 03/02/2012   Benign essential tremor 09/28/2010   Combined hyperlipidemia associated with type 2 diabetes mellitus ([place]) 09/26/2009   Osteopenia 09/26/2009   Postoperative hypothyroidism 09/26/2009   Multiple pulmonary nodules 08/22/2009   OSTEOARTHRITIS, KNEES, BILATERAL 08/07/2009   Allergic rhinitis 11/19/2008   History of thyroid cancer 09/26/2008   Spinal stenosis of lumbar region 03/26/2008   Hypothyroidism 07/03/2007   Insulin dependent type 2 diabetes mellitus, controlled ([place]) 07/03/2007   Spinal epidural abscess 07/03/2007   Benign essential HTN 07/03/2007   PSOAS MUSCLE ABSCESS 06/09/2007    Myrene Galas, PTA 09/15/20 3:33 PM  09/15/2020, 3:33 PM Sigurd Sos, PT 09/15/20 3:56 PM  [facility] [address] [place], [place], [address] Phone: [phone]   Fax:  [phone]  Name: [name] MRN: [id number] Date of Birth: 26-Dec-1940

## 2020-09-17 ENCOUNTER — Other Ambulatory Visit: Payer: Self-pay

## 2020-09-17 ENCOUNTER — Ambulatory Visit: Payer: Medicare Other | Admitting: Physical Therapy

## 2020-09-17 ENCOUNTER — Encounter: Payer: Medicare Other | Admitting: Physical Therapy

## 2020-09-17 DIAGNOSIS — R2689 Other abnormalities of gait and mobility: Secondary | ICD-10-CM | POA: Diagnosis not present

## 2020-09-17 DIAGNOSIS — E209 Hypoparathyroidism, unspecified: Secondary | ICD-10-CM | POA: Diagnosis not present

## 2020-09-17 DIAGNOSIS — G8929 Other chronic pain: Secondary | ICD-10-CM | POA: Diagnosis not present

## 2020-09-17 DIAGNOSIS — R252 Cramp and spasm: Secondary | ICD-10-CM | POA: Diagnosis not present

## 2020-09-17 DIAGNOSIS — M6281 Muscle weakness (generalized): Secondary | ICD-10-CM | POA: Diagnosis not present

## 2020-09-17 DIAGNOSIS — M546 Pain in thoracic spine: Secondary | ICD-10-CM

## 2020-09-17 DIAGNOSIS — M5442 Lumbago with sciatica, left side: Secondary | ICD-10-CM | POA: Diagnosis not present

## 2020-09-17 NOTE — Therapy (Signed)
[facility] [address], [place], [place], [address] Phone: [phone]   Fax:  [phone]  Physical Therapy Treatment  Patient Details  Name: [name] MRN: [id number] Date of Birth: 1940/02/12 Referring Provider (PT): [name], [place] MD   Encounter Date: 09/17/2020   PT End of Session - 09/17/20 1159     Visit Number 11    Date for PT Re-Evaluation 10/29/20    Authorization Type UHC medicare    PT Start Time 8182    PT Stop Time 1100    PT Time Calculation (min) 45 min    Activity Tolerance Patient tolerated treatment well    Behavior During Therapy [facility] for tasks assessed/performed             Past Medical History:  Diagnosis Date   Anemia    Asthma    Chest pain, atypical    Diabetes mellitus    GERD (gastroesophageal reflux disease)    History of transient ischemic attack (TIA)    Hyperlipidemia    Hypertension    OA (osteoarthritis)    Thyroid carcinoma ([place])     Past Surgical History:  Procedure Laterality Date   BACK SURGERY     BIOPSY  03/14/2018   Procedure: BIOPSY;  Surgeon: Wilford Corner, MD;  Location: [facility];  Service: Endoscopy;;  EGD and Colon   BREAST BIOPSY     CARDIAC CATHETERIZATION  03/18/2009   NORMAL LEFT VENTRICULAR SIZE AND CONTRACTILITY WITH NORMAL SYSTOLIC  FUNCTION. EF 60%   CARDIOLITE STUDY     SHOWED A SIGNIFICANT REVERSIBLE ANTERIOR WALL DEFECT CONSISTENT WITH ISCHEMIA. EF 55%   COLONOSCOPY WITH PROPOFOL N/A 03/14/2018   Procedure: COLONOSCOPY WITH PROPOFOL;  Surgeon: Wilford Corner, MD;  [facility];  Service: Endoscopy;  Laterality: N/A;   ESOPHAGOGASTRODUODENOSCOPY (EGD) WITH PROPOFOL N/A 03/14/2018   Procedure: ESOPHAGOGASTRODUODENOSCOPY (EGD) WITH PROPOFOL;  Surgeon: Wilford Corner, MD;  [facility];  Service: Endoscopy;  Laterality: N/A;   FEMUR IM NAIL Right 04/25/2015   Procedure: INTRAMEDULLARY (IM) RIGHT  RETROGRADE  FEMORAL NAILING;  Surgeon: Rod Can, MD;  [facility];  Service: Orthopedics;  Laterality: Right;   IR FL GUIDED LOC OF NEEDLE/CATH TIP FOR SPINAL INJECTION RT  04/05/2017   IR FLUORO GUIDE CV LINE RIGHT  04/08/2017   IR US GUIDE VASC ACCESS RIGHT  04/08/2017   LUMBAR FUSION     POLYPECTOMY  03/14/2018   Procedure: POLYPECTOMY;  Surgeon: Wilford Corner, MD;  [facility];  Service: Endoscopy;;   THYROIDECTOMY     TOTAL KNEE ARTHROPLASTY     right    There were no vitals filed for this visit.   Treatment:Patient seen for aquatic therapy today.  Treatment took place in water 2.5-4 feet deep depending upon activity.  Pt entered the pool via stairs step to step with heavy use of rails and extra time exiting. Pt requires buoyancy of water for support and to offload joints with strengthening exercises.    Seated water bench with 75% submersion Pt performed seated LE AROM exercises 20x in all planes, pain assessment concurrent and current status. Added 2# to LAQ 10x Bil, VC to contract core muscles.Abdominal compressions with nekadoodle 10x 5 sec hold.   Standing in 75% depth: Water walking holding onto large noodle 10x in each direction, light CGA for backwards walking secondary pt nervous/safety. Wall exercises with light UE: Bil 15 each heel raises, hip abd/add, flex/ext, circumduction, and squats, marching,  Yellow UE wts horizontl shld abd/add 20x  Attempted seated decompression for lumabr with PTA support for pt, pt too nervous to continue and couldn't relax. Netx session try adding aquajogger. Pt agreed to try                              PT Short Term Goals - 08/04/20 1603       PT SHORT TERM GOAL #1   Title be independent in initial HEP    Time 4    Period Weeks    Status New    Target Date 09/01/20      PT SHORT TERM GOAL #2   Title report a > or = to 30% reduction in LBP with standing and walking    Time 4    Period Weeks     Status New    Target Date 09/01/20      PT SHORT TERM GOAL #3   Title reduce LBP to allow for standing and walking > or = to 5 minutes without limitation    Time 4    Period Weeks    Status New    Target Date 09/01/20      PT SHORT TERM GOAL #4   Title ----      PT SHORT TERM GOAL #5   Title -----               PT Long Term Goals - 09/03/20 1854       PT LONG TERM GOAL #1   Title The patient will be independent with safe self progression of HEP    Time 8    Period Weeks    Status On-going    Target Date 10/29/20      PT LONG TERM GOAL #2   Title increase FOTO to > = to 53 to improve function    Time 8    Period Weeks    Status On-going    Target Date 10/29/20      PT LONG TERM GOAL #3   Title stand and walk with erect posture due to reduced LBP    Baseline pain levels have been variable due to chronic nature of condition.  Improved awareness with postural alignment    Time 8    Period Weeks    Status New    Target Date 10/29/20      PT LONG TERM GOAL #4   Title stand and walk for 5-10 minutes without limitation due to LBP to improve function and independence    Time 8    Period Weeks    Status On-going      PT LONG TERM GOAL #5   Title report < or = to 4/10 max LBP with standing and walking    Time 8    Period Weeks    Status On-going    Target Date 10/29/20                   Plan - 09/17/20 1200     Personal Factors and Comorbidities Comorbidity 3+    Comorbidities lumbar laminectomy, thyroid cancer, walker use x 10 years    Examination-Participation Restrictions Community Activity;Meal Prep;Cleaning    Stability/Clinical Decision Making Evolving/Moderate complexity    Rehab Potential Good    PT Frequency 2x / week    PT Duration 8 weeks    PT Treatment/Interventions ADLs/Self Care Home Management;Cryotherapy;Electrical Stimulation;Gait training;Stair training;Functional mobility training;Therapeutic  activities;Therapeutic  exercise;Neuromuscular re-education;Balance training;Patient/family education;Manual techniques;Dry needling;Taping;Spinal Manipulations;Joint Manipulations;Aquatic Therapy    PT Next Visit Plan Aquatics: do FOTO when pt sees PT in a week or so.    PT Home Exercise Plan Access Code: [email or web address]             Patient will benefit from skilled therapeutic intervention in order to improve the following deficits and impairments:  Decreased range of motion, Difficulty walking, Decreased endurance, Increased muscle spasms, Decreased activity tolerance, Pain, Improper body mechanics, Impaired flexibility, Decreased balance, Postural dysfunction  Visit Diagnosis: Muscle weakness (generalized)  Other abnormalities of gait and mobility  Cramp and spasm  Pain in thoracic spine  Chronic left-sided low back pain with left-sided sciatica     Problem List Patient Active Problem List   Diagnosis Date Noted   Age-related osteoporosis without current pathological fracture 06/13/2020   CVA, old, speech/language deficit 06/13/2020   Generalized abdominal pain 06/13/2020   Long term (current) use of insulin ([place]) 06/13/2020   Osteoporosis without current pathological fracture 06/13/2020   Pure hypercholesterolemia 06/13/2020   Pain in joint of right shoulder 06/26/2019   Iron deficiency anemia, unspecified 03/14/2018   Chronic pain syndrome 11/04/2017   Thoracic radiculopathy 11/04/2017   Pes anserinus bursitis of right knee 08/12/2017   GERD without esophagitis 05/12/2017   Pleuritic chest pain 05/04/2017   Medication monitoring encounter 05/04/2017   Dyslipidemia associated with type 2 diabetes mellitus ([place]) 04/20/2017   Hypertension associated with diabetes ([place]) 04/20/2017   Cerebrovascular accident (CVA) with involvement of right side of body ([place]) 04/20/2017   Peripheral autonomic neuropathy due to diabetes mellitus ([place]) 04/20/2017   Hypertensive renal disease with renal failure, stage  1-4 or unspecified chronic kidney disease 04/20/2017   Constipation 04/06/2017   Status post lumbar spinal fusion 04/03/2017   Recurrent UTI 04/03/2017   Chronic renal insufficiency 04/03/2017   Normocytic anemia 04/03/2017   Osteomyelitis of thoracic spine ([place]) 04/02/2017   Thoracic discitis 04/02/2017   DDD (degenerative disc disease), lumbar 12/31/2016   Narcotic dependence ([place]) 10/24/2015   Periprosthetic fracture around internal prosthetic right knee joint 04/25/2015   Fracture, femur, distal, right, closed, initial encounter 04/24/2015   Closed fracture of distal end of right femur, initial encounter ([place]) 04/24/2015   Acute gout 10/17/2013   Hypoparathyroidism ([place]) 03/02/2012   Benign essential tremor 09/28/2010   Combined hyperlipidemia associated with type 2 diabetes mellitus ([place]) 09/26/2009   Osteopenia 09/26/2009   Postoperative hypothyroidism 09/26/2009   Multiple pulmonary nodules 08/22/2009   OSTEOARTHRITIS, KNEES, BILATERAL 08/07/2009   Allergic rhinitis 11/19/2008   History of thyroid cancer 09/26/2008   Spinal stenosis of lumbar region 03/26/2008   Hypothyroidism 07/03/2007   Insulin dependent type 2 diabetes mellitus, controlled ([place]) 07/03/2007   Spinal epidural abscess 07/03/2007   Benign essential HTN 07/03/2007   PSOAS MUSCLE ABSCESS 06/09/2007    [name], PTA 09/17/2020, 12:39 PM  [facility] [address], [place], [place], [address] Phone: [phone]   Fax:  [phone]  Name: [name] MRN: [id number] Date of Birth: November 30, 1940

## 2020-09-18 DIAGNOSIS — M25511 Pain in right shoulder: Secondary | ICD-10-CM | POA: Diagnosis not present

## 2020-09-18 DIAGNOSIS — M19011 Primary osteoarthritis, right shoulder: Secondary | ICD-10-CM | POA: Diagnosis not present

## 2020-09-22 ENCOUNTER — Ambulatory Visit: Payer: Medicare Other | Admitting: Podiatry

## 2020-09-24 ENCOUNTER — Other Ambulatory Visit: Payer: Self-pay

## 2020-09-24 ENCOUNTER — Ambulatory Visit: Payer: Medicare Other | Admitting: Physical Therapy

## 2020-09-24 ENCOUNTER — Encounter: Payer: Self-pay | Admitting: Physical Therapy

## 2020-09-24 DIAGNOSIS — M546 Pain in thoracic spine: Secondary | ICD-10-CM

## 2020-09-24 DIAGNOSIS — R252 Cramp and spasm: Secondary | ICD-10-CM

## 2020-09-24 DIAGNOSIS — G8929 Other chronic pain: Secondary | ICD-10-CM | POA: Diagnosis not present

## 2020-09-24 DIAGNOSIS — R2689 Other abnormalities of gait and mobility: Secondary | ICD-10-CM | POA: Diagnosis not present

## 2020-09-24 DIAGNOSIS — M6281 Muscle weakness (generalized): Secondary | ICD-10-CM

## 2020-09-24 DIAGNOSIS — M5442 Lumbago with sciatica, left side: Secondary | ICD-10-CM | POA: Diagnosis not present

## 2020-09-24 NOTE — Therapy (Signed)
[facility] [address] [place], [place], [address] Phone: [phone]   Fax:  [phone]  Physical Therapy Treatment  Patient Details  Name: [name] MRN: [id number] Date of Birth: [birth year] Referring Provider (PT): [name], [place] MD   Encounter Date: 09/24/2020   PT End of Session - 09/24/20 1126     Visit Number 12    Date for PT Re-Evaluation 10/29/20    Authorization Type UHC medicare    PT Start Time 7035    PT Stop Time 1100    PT Time Calculation (min) 45 min    Activity Tolerance Patient tolerated treatment well    Behavior During Therapy [facility] for tasks assessed/performed             Past Medical History:  Diagnosis Date   Anemia    Asthma    Chest pain, atypical    Diabetes mellitus    GERD (gastroesophageal reflux disease)    History of transient ischemic attack (TIA)    Hyperlipidemia    Hypertension    OA (osteoarthritis)    Thyroid carcinoma ([place])     Past Surgical History:  Procedure Laterality Date   BACK SURGERY     BIOPSY  03/14/2018   Procedure: BIOPSY;  Surgeon: Wilford Corner, MD;  [facility];  Service: Endoscopy;;  EGD and Colon   BREAST BIOPSY     CARDIAC CATHETERIZATION  03/18/2009   NORMAL LEFT VENTRICULAR SIZE AND CONTRACTILITY WITH NORMAL SYSTOLIC  FUNCTION. EF 60%   CARDIOLITE STUDY     SHOWED A SIGNIFICANT REVERSIBLE ANTERIOR WALL DEFECT CONSISTENT WITH ISCHEMIA. EF 55%   COLONOSCOPY WITH PROPOFOL N/A 03/14/2018   Procedure: COLONOSCOPY WITH PROPOFOL;  Surgeon: Wilford Corner, MD;  [facility];  Service: Endoscopy;  Laterality: N/A;   ESOPHAGOGASTRODUODENOSCOPY (EGD) WITH PROPOFOL N/A 03/14/2018   Procedure: ESOPHAGOGASTRODUODENOSCOPY (EGD) WITH PROPOFOL;  Surgeon: Wilford Corner, MD;  [facility];  Service: Endoscopy;  Laterality: N/A;   FEMUR IM NAIL Right 04/25/2015   Procedure: INTRAMEDULLARY (IM) RIGHT  RETROGRADE  FEMORAL NAILING;  Surgeon: Rod Can, MD;  [facility];  Service: Orthopedics;  Laterality: Right;   IR FL GUIDED LOC OF NEEDLE/CATH TIP FOR SPINAL INJECTION RT  04/05/2017   IR FLUORO GUIDE CV LINE RIGHT  04/08/2017   IR US GUIDE VASC ACCESS RIGHT  04/08/2017   LUMBAR FUSION     POLYPECTOMY  03/14/2018   Procedure: POLYPECTOMY;  Surgeon: Wilford Corner, MD;  [facility];  Service: Endoscopy;;   THYROIDECTOMY     TOTAL KNEE ARTHROPLASTY     right    There were no vitals filed for this visit.   Subjective Assessment - 09/24/20 1124     Subjective My back is doing good. I'm just very tired from taking a birthday trip this past weekend.    Pertinent History lumbar surgery Dr [name]- > 5 years,  walker use for all mobility, thyroid cancer, osteoporosis, CVA 2009, Rt sided weakness    Currently in Pain? Yes    Pain Score 4     Pain Location Back    Pain Orientation Lower    Pain Descriptors / Indicators Dull;Aching    Aggravating Factors  walking and standing too long    Pain Relieving Factors exercising in the water    Multiple Pain Sites No             Treatment:Patient seen for aquatic therapy today.  Treatment  took place in water 2.5-4 feet deep depending upon activity.  Pt entered the pool via stairs:step  to step with extra time getting in and out. Pt requires buoyancy of water for support and to offload joints with strengthening exercises.  Water temp 93 degrees F.  Seated water bench with 75% submersion Pt performed seated LE AROM exercises 20x in all planes, 2# LAQ 20x Bil, manual soft tissue work Bil gastroc, abdominal compressions with nekdoodle 10x 3 sec hold, VC to relax her shoulders.  Hands in mitten shape for gentle shoulder abd/add 20x  Standing in 75% depth: 8 lengths  with yellow noodle walking in all 4 directions. CGA with backwards walking Wall exs  for UE support: Bil heel raises 15x, hip abd 10x, marching 10x, had to sit in between to rest  LE.                             PT Short Term Goals - 08/04/20 1603       PT SHORT TERM GOAL #1   Title be independent in initial HEP    Time 4    Period Weeks    Status New    Target Date 09/01/20      PT SHORT TERM GOAL #2   Title report a > or = to 30% reduction in LBP with standing and walking    Time 4    Period Weeks    Status New    Target Date 09/01/20      PT SHORT TERM GOAL #3   Title reduce LBP to allow for standing and walking > or = to 5 minutes without limitation    Time 4    Period Weeks    Status New    Target Date 09/01/20      PT SHORT TERM GOAL #4   Title ----      PT SHORT TERM GOAL #5   Title -----               PT Long Term Goals - 09/03/20 1854       PT LONG TERM GOAL #1   Title The patient will be independent with safe self progression of HEP    Time 8    Period Weeks    Status On-going    Target Date 10/29/20      PT LONG TERM GOAL #2   Title increase FOTO to > = to 53 to improve function    Time 8    Period Weeks    Status On-going    Target Date 10/29/20      PT LONG TERM GOAL #3   Title stand and walk with erect posture due to reduced LBP    Baseline pain levels have been variable due to chronic nature of condition.  Improved awareness with postural alignment    Time 8    Period Weeks    Status New    Target Date 10/29/20      PT LONG TERM GOAL #4   Title stand and walk for 5-10 minutes without limitation due to LBP to improve function and independence    Time 8    Period Weeks    Status On-going      PT LONG TERM GOAL #5   Title report < or = to 4/10 max LBP with standing and walking    Time 8    Period Weeks  Status On-going    Target Date 10/29/20                   Plan - 09/24/20 1127     Clinical Impression Statement pt doing very well mananging her low back pain with the introduction of aquatic exercise. [place], and  enjoy herself with mild discomfort and only fatigue the next 1-2 days. Pt arrives today with mild back pain which was not exacerbated by any exerices today, just "my legs are tired." Pt did require more sitting for short rest breaks today secondary to that fatigue.    Personal Factors and Comorbidities Comorbidity 3+    Comorbidities lumbar laminectomy, thyroid cancer, walker use x 10 years    Examination-Activity Limitations Carry;Locomotion Level;Stairs;Stand;Transfers    Stability/Clinical Decision Making Evolving/Moderate complexity    PT Frequency 2x / week    PT Duration 8 weeks    PT Treatment/Interventions ADLs/Self Care Home Management;Cryotherapy;Electrical Stimulation;Gait training;Stair training;Functional mobility training;Therapeutic activities;Therapeutic exercise;Neuromuscular re-education;Balance training;Patient/family education;Manual techniques;Dry needling;Taping;Spinal Manipulations;Joint Manipulations;Aquatic Therapy    PT Next Visit Plan Aquatics: do FOTO when pt sees PT in a week or so.    PT Home Exercise Plan Access Code: [email or web address]    Consulted and Agree with Plan of Care Patient             Patient will benefit from skilled therapeutic intervention in order to improve the following deficits and impairments:  Decreased range of motion, Difficulty walking, Decreased endurance, Increased muscle spasms, Decreased activity tolerance, Pain, Improper body mechanics, Impaired flexibility, Decreased balance, Postural dysfunction  Visit Diagnosis: Muscle weakness (generalized)  Other abnormalities of gait and mobility  Cramp and spasm  Pain in thoracic spine     Problem List Patient Active Problem List   Diagnosis Date Noted   Age-related osteoporosis without current pathological fracture 06/13/2020   CVA, old, speech/language deficit 06/13/2020   Generalized abdominal pain 06/13/2020   Long term (current) use of insulin ([place]) 06/13/2020   Osteoporosis without  current pathological fracture 06/13/2020   Pure hypercholesterolemia 06/13/2020   Pain in joint of right shoulder 06/26/2019   Iron deficiency anemia, unspecified 03/14/2018   Chronic pain syndrome 11/04/2017   Thoracic radiculopathy 11/04/2017   Pes anserinus bursitis of right knee 08/12/2017   GERD without esophagitis 05/12/2017   Pleuritic chest pain 05/04/2017   Medication monitoring encounter 05/04/2017   Dyslipidemia associated with type 2 diabetes mellitus ([place]) 04/20/2017   Hypertension associated with diabetes ([place]) 04/20/2017   Cerebrovascular accident (CVA) with involvement of right side of body ([place]) 04/20/2017   Peripheral autonomic neuropathy due to diabetes mellitus ([place]) 04/20/2017   Hypertensive renal disease with renal failure, stage 1-4 or unspecified chronic kidney disease 04/20/2017   Constipation 04/06/2017   Status post lumbar spinal fusion 04/03/2017   Recurrent UTI 04/03/2017   Chronic renal insufficiency 04/03/2017   Normocytic anemia 04/03/2017   Osteomyelitis of thoracic spine ([place]) 04/02/2017   Thoracic discitis 04/02/2017   DDD (degenerative disc disease), lumbar 12/31/2016   Narcotic dependence ([place]) 10/24/2015   Periprosthetic fracture around internal prosthetic right knee joint 04/25/2015   Fracture, femur, distal, right, closed, initial encounter 04/24/2015   Closed fracture of distal end of right femur, initial encounter ([place]) 04/24/2015   Acute gout 10/17/2013   Hypoparathyroidism ([place]) 03/02/2012   Benign essential tremor 09/28/2010   Combined hyperlipidemia associated with type 2 diabetes mellitus ([place]) 09/26/2009   Osteopenia 09/26/2009  Postoperative hypothyroidism 09/26/2009   Multiple pulmonary nodules 08/22/2009   OSTEOARTHRITIS, KNEES, BILATERAL 08/07/2009   Allergic rhinitis 11/19/2008   History of thyroid cancer 09/26/2008   Spinal stenosis of lumbar region 03/26/2008   Hypothyroidism 07/03/2007   Insulin dependent type 2 diabetes  mellitus, controlled ([place]) 07/03/2007   Spinal epidural abscess 07/03/2007   Benign essential HTN 07/03/2007   PSOAS MUSCLE ABSCESS 06/09/2007    Devun Anna, PTA 09/24/2020, 11:30 AM  [facility] [address], [place], [place], [address] Phone: [phone]   Fax:  [phone]  Name: [name] MRN: [id number] Date of Birth: 05/20/1940

## 2020-09-26 ENCOUNTER — Other Ambulatory Visit: Payer: Self-pay

## 2020-09-26 ENCOUNTER — Ambulatory Visit: Payer: Medicare Other | Admitting: Physical Therapy

## 2020-09-26 ENCOUNTER — Encounter: Payer: Self-pay | Admitting: Physical Therapy

## 2020-09-26 DIAGNOSIS — R2689 Other abnormalities of gait and mobility: Secondary | ICD-10-CM | POA: Diagnosis not present

## 2020-09-26 DIAGNOSIS — M5442 Lumbago with sciatica, left side: Secondary | ICD-10-CM | POA: Diagnosis not present

## 2020-09-26 DIAGNOSIS — G8929 Other chronic pain: Secondary | ICD-10-CM | POA: Diagnosis not present

## 2020-09-26 DIAGNOSIS — R252 Cramp and spasm: Secondary | ICD-10-CM

## 2020-09-26 DIAGNOSIS — M546 Pain in thoracic spine: Secondary | ICD-10-CM

## 2020-09-26 DIAGNOSIS — M6281 Muscle weakness (generalized): Secondary | ICD-10-CM

## 2020-09-26 NOTE — Therapy (Addendum)
[facility] [address] [place], [place], [address] Phone: [phone]   Fax:  [phone]  Physical Therapy Treatment  Patient Details  Name: [name] MRN: [id number] Date of Birth: 1940/06/01 Referring Provider (PT): [name], [place] MD   Encounter Date: 09/26/2020   PT End of Session - 09/26/20 1610     Visit Number 13    Date for PT Re-Evaluation 10/29/20    Authorization Type [employer] medicare    PT Start Time 1345    PT Stop Time 1430    PT Time Calculation (min) 45 min    Activity Tolerance Patient tolerated treatment well    Behavior During Therapy [facility] for tasks assessed/performed             Past Medical History:  Diagnosis Date   Anemia    Asthma    Chest pain, atypical    Diabetes mellitus    GERD (gastroesophageal reflux disease)    History of transient ischemic attack (TIA)    Hyperlipidemia    Hypertension    OA (osteoarthritis)    Thyroid carcinoma ([place])     Past Surgical History:  Procedure Laterality Date   BACK SURGERY     BIOPSY  03/14/2018   Procedure: BIOPSY;  Surgeon: Wilford Corner, MD;  Location: [facility];  Service: Endoscopy;;  EGD and Colon   BREAST BIOPSY     CARDIAC CATHETERIZATION  03/18/2009   NORMAL LEFT VENTRICULAR SIZE AND CONTRACTILITY WITH NORMAL SYSTOLIC  FUNCTION. EF 60%   CARDIOLITE STUDY     SHOWED A SIGNIFICANT REVERSIBLE ANTERIOR WALL DEFECT CONSISTENT WITH ISCHEMIA. EF 55%   COLONOSCOPY WITH PROPOFOL N/A 03/14/2018   Procedure: COLONOSCOPY WITH PROPOFOL;  Surgeon: Wilford Corner, MD;  Location: [facility];  Service: Endoscopy;  Laterality: N/A;   ESOPHAGOGASTRODUODENOSCOPY (EGD) WITH PROPOFOL N/A 03/14/2018   Procedure: ESOPHAGOGASTRODUODENOSCOPY (EGD) WITH PROPOFOL;  Surgeon: Wilford Corner, MD;  Location: [facility];  Service: Endoscopy;  Laterality: N/A;   FEMUR IM NAIL Right 04/25/2015   Procedure: INTRAMEDULLARY (IM) RIGHT  RETROGRADE  FEMORAL NAILING;  Surgeon: Rod Can, MD;  Location: [facility];  Service: Orthopedics;  Laterality: Right;   IR FL GUIDED LOC OF NEEDLE/CATH TIP FOR SPINAL INJECTION RT  04/05/2017   IR FLUORO GUIDE CV LINE RIGHT  04/08/2017   IR US GUIDE VASC ACCESS RIGHT  04/08/2017   LUMBAR FUSION     POLYPECTOMY  03/14/2018   Procedure: POLYPECTOMY;  Surgeon: Wilford Corner, MD;  Location: [facility];  Service: Endoscopy;;   THYROIDECTOMY     TOTAL KNEE ARTHROPLASTY     right    There were no vitals filed for this visit.   Subjective Assessment - 09/26/20 1609     Subjective My back is 80% better and I am ready to go back to the [facility] to join there and continue my exercises. I want today to be my last day.    Pertinent History lumbar surgery Dr [name]- > 5 years,  walker use for all mobility, thyroid cancer, osteoporosis, CVA 2009, Rt sided weakness    Currently in Pain? No/denies    Aggravating Factors  Walking too long or standing still too long    Pain Relieving Factors Exercising in the water    Multiple Pain Sites No             Treatment: Patient seen for aquatic therapy today.  Treatment took place in water 2.5-4 feet deep depending upon activity.  Pt entered the pool via Stairs, reciprocally with heavy use of the hand rails. Pt requires buoyancy of water for support and to offload joints with strengthening exercises. Water temp 97 degree F.  Seated water bench with 75% submersion Pt performed seated LE AROM exercises 20x in all planes, concurrent pain assessment and discussion of pt's future plans for exercise. 2# LAQ 20x Bil, gastroc soft tissue work Bil, abdominal compression with nekadoodle 5 sec hold 10x, UE submerged for horizontal abb/abd 20x with multicolored [id number]  75% depth for water walking 6x in each diretion using large noodle, SB A for backwards walking. Wall exercises including Bil 15x marching, hip abduction, calf raises,      [facility] PT Assessment - 09/26/20 0001        Assessment   Medical Diagnosis Rt upper thoracic pain, discitis of thoracic region    Referring Provider (PT) [name], [name] MD    Onset Date/Surgical Date 08/05/10      Strength   Overall Strength Comments 4/5 bil hips, 4+/5 bil knees      Ambulation/Gait   Assistive device Rolling walker    Gait Pattern Trunk flexed;Lateral trunk lean to right;Antalgic                                      PT Short Term Goals - 08/04/20 1603       PT SHORT TERM GOAL #1   Title be independent in initial HEP    Time 4    Period Weeks    Status New    Target Date 09/01/20      PT SHORT TERM GOAL #2   Title report a > or = to 30% reduction in LBP with standing and walking    Time 4    Period Weeks    Status New    Target Date 09/01/20      PT SHORT TERM GOAL #3   Title reduce LBP to allow for standing and walking > or = to 5 minutes without limitation    Time 4    Period Weeks    Status New    Target Date 09/01/20      PT SHORT TERM GOAL #4   Title ----      PT SHORT TERM GOAL #5   Title -----               PT Long Term Goals - 09/26/20 1614       PT LONG TERM GOAL #1   Title The patient will be independent with safe self progression of HEP    Time 8    Period Weeks    Status Achieved      PT LONG TERM GOAL #2   Time 8    Period Weeks    Status Unable to assess      PT LONG TERM GOAL #3   Title stand and walk with erect posture due to reduced LBP    Baseline pain levels have been variable due to chronic nature of condition.  Improved awareness with postural alignment    Time 8    Period Weeks    Status Partially Met   Some days I can stand straight and others I just can't.     PT LONG TERM GOAL #4   Title stand and walk for 5-10 minutes without limitation due to LBP to improve function and independence  Time 8    Period Weeks    Status Achieved   Closer to the 5-7 min mark consistently     PT LONG TERM GOAL #5   Title report  < or = to 4/10 max LBP with standing and walking    Time 8    Period Weeks    Status Partially Met   In the pool and the day after 0/10 pain, weekends can get to 6/10                  Plan - 09/26/20 1611     Clinical Impression Statement Pt arrives to aquatic stating she would like today to be her last Pt visit as she is very happy with how her back is doing and wants to go back to the [facility] to do 3x week exercise there.She reports an 80% decrease in pain, over the weekends she still experiences increased back pain especially if she "over does it."    Personal Factors and Comorbidities Comorbidity 3+    Comorbidities lumbar laminectomy, thyroid cancer, walker use x 10 years    Examination-Activity Limitations Carry;Locomotion Level;Stairs;Stand;Transfers    Examination-Participation Restrictions Community Activity;Meal Prep;Cleaning    Stability/Clinical Decision Making Evolving/Moderate complexity    Rehab Potential Good    PT Frequency 2x / week    PT Duration 8 weeks    PT Treatment/Interventions ADLs/Self Care Home Management;Cryotherapy;Electrical Stimulation;Gait training;Stair training;Functional mobility training;Therapeutic activities;Therapeutic exercise;Neuromuscular re-education;Balance training;Patient/family education;Manual techniques;Dry needling;Taping;Spinal Manipulations;Joint Manipulations;Aquatic Therapy    PT Next Visit Plan DC per Pt request today, pt plans on returning to the [facility] to participate in water exercises 3x week.    PT Home Exercise Plan Access Code: [place]             Patient will benefit from skilled therapeutic intervention in order to improve the following deficits and impairments:     Visit Diagnosis: Muscle weakness (generalized)  Other abnormalities of gait and mobility  Cramp and spasm  Pain in thoracic spine     Problem List Patient Active Problem List   Diagnosis Date Noted   Age-related osteoporosis without current  pathological fracture 06/13/2020   CVA, old, speech/language deficit 06/13/2020   Generalized abdominal pain 06/13/2020   Long term (current) use of insulin ([place]) 06/13/2020   Osteoporosis without current pathological fracture 06/13/2020   Pure hypercholesterolemia 06/13/2020   Pain in joint of right shoulder 06/26/2019   Iron deficiency anemia, unspecified 03/14/2018   Chronic pain syndrome 11/04/2017   Thoracic radiculopathy 11/04/2017   Pes anserinus bursitis of right knee 08/12/2017   GERD without esophagitis 05/12/2017   Pleuritic chest pain 05/04/2017   Medication monitoring encounter 05/04/2017   Dyslipidemia associated with type 2 diabetes mellitus ([place]) 04/20/2017   Hypertension associated with diabetes ([place]) 04/20/2017   Cerebrovascular accident (CVA) with involvement of right side of body ([place]) 04/20/2017   Peripheral autonomic neuropathy due to diabetes mellitus ([place]) 04/20/2017   Hypertensive renal disease with renal failure, stage 1-4 or unspecified chronic kidney disease 04/20/2017   Constipation 04/06/2017   Status post lumbar spinal fusion 04/03/2017   Recurrent UTI 04/03/2017   Chronic renal insufficiency 04/03/2017   Normocytic anemia 04/03/2017   Osteomyelitis of thoracic spine ([place]) 04/02/2017   Thoracic discitis 04/02/2017   DDD (degenerative disc disease), lumbar 12/31/2016   Narcotic dependence ([place]) 10/24/2015   Periprosthetic fracture around internal prosthetic right knee joint 04/25/2015   Fracture, femur, distal, right, closed, initial encounter 04/24/2015   Closed  fracture of distal end of right femur, initial encounter ([place]) 04/24/2015   Acute gout 10/17/2013   Hypoparathyroidism ([place]) 03/02/2012   Benign essential tremor 09/28/2010   Combined hyperlipidemia associated with type 2 diabetes mellitus ([place]) 09/26/2009   Osteopenia 09/26/2009   Postoperative hypothyroidism 09/26/2009   Multiple pulmonary nodules 08/22/2009   OSTEOARTHRITIS, KNEES,  BILATERAL 08/07/2009   Allergic rhinitis 11/19/2008   History of thyroid cancer 09/26/2008   Spinal stenosis of lumbar region 03/26/2008   Hypothyroidism 07/03/2007   Insulin dependent type 2 diabetes mellitus, controlled ([place]) 07/03/2007   Spinal epidural abscess 07/03/2007   Benign essential HTN 07/03/2007   PSOAS MUSCLE ABSCESS 06/09/2007    Myrene Galas, PTA 09/26/20 4:23 PM  09/26/2020, 4:23 PM PHYSICAL THERAPY DISCHARGE SUMMARY  Visits from Start of Care: 13  Current functional level related to goals / functional outcomes: See above for most current status.     Remaining deficits: See above.  Pt plans to continue aquatic exercise for strength and endurance gains .    Education / Equipment: HEP   Patient agrees to discharge. Patient goals were partially met. Patient is being discharged due to being pleased with the current functional level.  Sigurd Sos, PT 09/29/20 10:31 AM   [facility] [address], [place], [place], [address] Phone: [phone]   Fax:  [phone]  Name: [name] MRN: [id number] Date of Birth: [birth year]

## 2020-09-29 ENCOUNTER — Ambulatory Visit: Payer: Medicare Other

## 2020-10-01 ENCOUNTER — Ambulatory Visit: Payer: Medicare Other | Admitting: Physical Therapy

## 2020-10-08 ENCOUNTER — Ambulatory Visit: Payer: Medicare Other | Admitting: Physical Therapy

## 2020-10-10 ENCOUNTER — Ambulatory Visit: Payer: Self-pay | Admitting: Physical Therapy

## 2020-10-10 DIAGNOSIS — M25511 Pain in right shoulder: Secondary | ICD-10-CM | POA: Diagnosis not present

## 2020-10-15 ENCOUNTER — Ambulatory Visit: Payer: Medicare Other | Admitting: Physical Therapy

## 2020-10-17 ENCOUNTER — Ambulatory Visit: Payer: Self-pay | Admitting: Physical Therapy

## 2020-10-21 ENCOUNTER — Ambulatory Visit: Payer: Medicare Other | Admitting: Podiatry

## 2020-10-22 ENCOUNTER — Ambulatory Visit: Payer: Medicare Other | Admitting: Physical Therapy

## 2020-10-24 ENCOUNTER — Other Ambulatory Visit: Payer: Self-pay

## 2020-10-24 ENCOUNTER — Ambulatory Visit (INDEPENDENT_AMBULATORY_CARE_PROVIDER_SITE_OTHER): Payer: Medicare Other | Admitting: Podiatry

## 2020-10-24 ENCOUNTER — Ambulatory Visit: Payer: Self-pay | Admitting: Physical Therapy

## 2020-10-24 ENCOUNTER — Encounter: Payer: Self-pay | Admitting: Podiatry

## 2020-10-24 DIAGNOSIS — B351 Tinea unguium: Secondary | ICD-10-CM

## 2020-10-24 DIAGNOSIS — M79674 Pain in right toe(s): Secondary | ICD-10-CM | POA: Diagnosis not present

## 2020-10-24 DIAGNOSIS — L84 Corns and callosities: Secondary | ICD-10-CM | POA: Diagnosis not present

## 2020-10-24 DIAGNOSIS — E1142 Type 2 diabetes mellitus with diabetic polyneuropathy: Secondary | ICD-10-CM | POA: Diagnosis not present

## 2020-10-24 DIAGNOSIS — C73 Malignant neoplasm of thyroid gland: Secondary | ICD-10-CM | POA: Insufficient documentation

## 2020-10-24 DIAGNOSIS — M79675 Pain in left toe(s): Secondary | ICD-10-CM

## 2020-10-31 NOTE — Progress Notes (Signed)
Subjective: [name] is a 80 y.o. female patient seen today for follow up of callus left great toe and painful thick toenails that are difficult to trim. Pain interferes with ambulation. Aggravating factors include wearing enclosed shoe gear. Pain is relieved with periodic professional debridement.  New problems reported today: None.  She is diabetic. Patient states their blood glucose was 99 mg/dl today.   PCP is [name], [name], MD.  Allergies  Allergen Reactions   Pioglitazone Swelling    Other reaction(s): swelling   Actos [Pioglitazone Hydrochloride] Swelling   Amlodipine Besylate     Other reaction(s): Unknown   Cephalexin Other (See Comments)    Doesn't remember  Other reaction(s): unknown   Lorazepam     swelling Other reaction(s): swelling   Oxycodone     This medication makes her feel sick   Oxycodone-Acetaminophen     Other reaction(s): vomiting   Penicillin G Benzathine     Other reaction(s): Unknown   Rosiglitazone     unknown Other reaction(s): unknown   Tramadol Other (See Comments)    hallucinations   Tramadol Hcl     Other reaction(s): hallucinations    Objective: Physical Exam  General: Patient is a pleasant 80 y.o. African American female in NAD. AAO x 3.   Neurovascular Examination: Capillary refill time to digits immediate b/l. Palpable DP pulse(s) b/l lower extremities Palpable PT pulse(s) b/l lower extremities Pedal hair absent. Lower extremity skin temperature gradient within normal limits. No pain with calf compression b/l. No edema noted b/l LE.  Protective sensation diminished with 10g monofilament b/l.  Dermatological:  Pedal skin is warm and supple b/l LE. No open wounds b/l LE. No interdigital macerations noted b/l LE. Toenails 2-5 bilaterally elongated, discolored, dystrophic, thickened, and crumbly with subungual debris and tenderness to dorsal palpation. Anonychia noted L hallux and R hallux. Nailbed(s) epithelialized.   Hyperkeratotic lesion(s) L hallux.  No erythema, no edema, no drainage, no fluctuance.  Musculoskeletal:  Normal muscle strength 5/5 to all lower extremity muscle groups bilaterally. No pain crepitus or joint limitation noted with ROM b/l lower extremities.  Assessment: 1. Pain due to onychomycosis of toenails of both feet   2. Callus   3. Diabetic peripheral neuropathy associated with type 2 diabetes mellitus ([place])    Plan: Patient was evaluated and treated and all questions answered. Consent given for treatment as described below: -Continue diabetic foot care principles: inspect feet daily, monitor glucose as recommended by PCP and/or Endocrinologist, and follow prescribed diet per PCP, Endocrinologist and/or dietician. -Toenails 2-5 bilaterally debrided in length and girth without iatrogenic bleeding with sterile nail nipper and dremel.  -Callus(es) L hallux pared utilizing sterile scalpel blade without complication or incident. Total number debrided =1. -Patient/POA to call should there be question/concern in the interim.  Return in about 3 months (around 01/24/2021).  Marzetta Board, DPM

## 2020-11-03 DIAGNOSIS — E1169 Type 2 diabetes mellitus with other specified complication: Secondary | ICD-10-CM | POA: Diagnosis not present

## 2020-11-03 DIAGNOSIS — M810 Age-related osteoporosis without current pathological fracture: Secondary | ICD-10-CM | POA: Diagnosis not present

## 2020-11-03 DIAGNOSIS — K219 Gastro-esophageal reflux disease without esophagitis: Secondary | ICD-10-CM | POA: Diagnosis not present

## 2020-11-03 DIAGNOSIS — I10 Essential (primary) hypertension: Secondary | ICD-10-CM | POA: Diagnosis not present

## 2020-11-03 DIAGNOSIS — G8929 Other chronic pain: Secondary | ICD-10-CM | POA: Diagnosis not present

## 2020-11-03 DIAGNOSIS — E890 Postprocedural hypothyroidism: Secondary | ICD-10-CM | POA: Diagnosis not present

## 2020-11-03 DIAGNOSIS — D509 Iron deficiency anemia, unspecified: Secondary | ICD-10-CM | POA: Diagnosis not present

## 2020-11-03 DIAGNOSIS — E7800 Pure hypercholesterolemia, unspecified: Secondary | ICD-10-CM | POA: Diagnosis not present

## 2020-11-03 DIAGNOSIS — N1832 Chronic kidney disease, stage 3b: Secondary | ICD-10-CM | POA: Diagnosis not present

## 2020-11-05 DIAGNOSIS — G8929 Other chronic pain: Secondary | ICD-10-CM | POA: Diagnosis not present

## 2020-11-05 DIAGNOSIS — I69328 Other speech and language deficits following cerebral infarction: Secondary | ICD-10-CM | POA: Diagnosis not present

## 2020-11-05 DIAGNOSIS — R229 Localized swelling, mass and lump, unspecified: Secondary | ICD-10-CM | POA: Diagnosis not present

## 2020-11-06 ENCOUNTER — Other Ambulatory Visit: Payer: Self-pay | Admitting: Family Medicine

## 2020-11-06 DIAGNOSIS — R229 Localized swelling, mass and lump, unspecified: Secondary | ICD-10-CM

## 2020-11-09 ENCOUNTER — Emergency Department (HOSPITAL_COMMUNITY): Payer: Medicare Other

## 2020-11-09 ENCOUNTER — Emergency Department (HOSPITAL_COMMUNITY)
Admission: EM | Admit: 2020-11-09 | Discharge: 2020-11-09 | Disposition: A | Discharge status: Home / Self Care | Payer: Medicare Other | Attending: Emergency Medicine | Admitting: Emergency Medicine

## 2020-11-09 ENCOUNTER — Other Ambulatory Visit: Payer: Self-pay

## 2020-11-09 ENCOUNTER — Encounter (HOSPITAL_COMMUNITY): Payer: Self-pay | Admitting: Emergency Medicine

## 2020-11-09 DIAGNOSIS — I129 Hypertensive chronic kidney disease with stage 1 through stage 4 chronic kidney disease, or unspecified chronic kidney disease: Secondary | ICD-10-CM | POA: Insufficient documentation

## 2020-11-09 DIAGNOSIS — Z7951 Long term (current) use of inhaled steroids: Secondary | ICD-10-CM | POA: Diagnosis not present

## 2020-11-09 DIAGNOSIS — Z85850 Personal history of malignant neoplasm of thyroid: Secondary | ICD-10-CM | POA: Insufficient documentation

## 2020-11-09 DIAGNOSIS — C73 Malignant neoplasm of thyroid gland: Secondary | ICD-10-CM | POA: Diagnosis not present

## 2020-11-09 DIAGNOSIS — N189 Chronic kidney disease, unspecified: Secondary | ICD-10-CM | POA: Insufficient documentation

## 2020-11-09 DIAGNOSIS — S299XXA Unspecified injury of thorax, initial encounter: Secondary | ICD-10-CM | POA: Diagnosis not present

## 2020-11-09 DIAGNOSIS — M40204 Unspecified kyphosis, thoracic region: Secondary | ICD-10-CM | POA: Diagnosis not present

## 2020-11-09 DIAGNOSIS — E890 Postprocedural hypothyroidism: Secondary | ICD-10-CM | POA: Diagnosis not present

## 2020-11-09 DIAGNOSIS — E1140 Type 2 diabetes mellitus with diabetic neuropathy, unspecified: Secondary | ICD-10-CM | POA: Diagnosis not present

## 2020-11-09 DIAGNOSIS — R1030 Lower abdominal pain, unspecified: Secondary | ICD-10-CM | POA: Diagnosis not present

## 2020-11-09 DIAGNOSIS — S34109A Unspecified injury to unspecified level of lumbar spinal cord, initial encounter: Secondary | ICD-10-CM | POA: Insufficient documentation

## 2020-11-09 DIAGNOSIS — T1490XA Injury, unspecified, initial encounter: Secondary | ICD-10-CM

## 2020-11-09 DIAGNOSIS — S0993XA Unspecified injury of face, initial encounter: Secondary | ICD-10-CM | POA: Diagnosis not present

## 2020-11-09 DIAGNOSIS — I700 Atherosclerosis of aorta: Secondary | ICD-10-CM | POA: Diagnosis not present

## 2020-11-09 DIAGNOSIS — S24109A Unspecified injury at unspecified level of thoracic spinal cord, initial encounter: Secondary | ICD-10-CM | POA: Diagnosis not present

## 2020-11-09 DIAGNOSIS — R6889 Other general symptoms and signs: Secondary | ICD-10-CM | POA: Diagnosis not present

## 2020-11-09 DIAGNOSIS — Z79899 Other long term (current) drug therapy: Secondary | ICD-10-CM | POA: Diagnosis not present

## 2020-11-09 DIAGNOSIS — S0990XA Unspecified injury of head, initial encounter: Secondary | ICD-10-CM | POA: Diagnosis not present

## 2020-11-09 DIAGNOSIS — M5124 Other intervertebral disc displacement, thoracic region: Secondary | ICD-10-CM | POA: Diagnosis not present

## 2020-11-09 DIAGNOSIS — S3991XA Unspecified injury of abdomen, initial encounter: Secondary | ICD-10-CM | POA: Diagnosis not present

## 2020-11-09 DIAGNOSIS — E039 Hypothyroidism, unspecified: Secondary | ICD-10-CM | POA: Diagnosis not present

## 2020-11-09 DIAGNOSIS — Z87891 Personal history of nicotine dependence: Secondary | ICD-10-CM | POA: Insufficient documentation

## 2020-11-09 DIAGNOSIS — R109 Unspecified abdominal pain: Secondary | ICD-10-CM | POA: Diagnosis not present

## 2020-11-09 DIAGNOSIS — Z96651 Presence of right artificial knee joint: Secondary | ICD-10-CM | POA: Diagnosis not present

## 2020-11-09 DIAGNOSIS — R0789 Other chest pain: Secondary | ICD-10-CM | POA: Insufficient documentation

## 2020-11-09 DIAGNOSIS — G319 Degenerative disease of nervous system, unspecified: Secondary | ICD-10-CM | POA: Diagnosis not present

## 2020-11-09 DIAGNOSIS — Y92410 Unspecified street and highway as the place of occurrence of the external cause: Secondary | ICD-10-CM | POA: Insufficient documentation

## 2020-11-09 DIAGNOSIS — J45909 Unspecified asthma, uncomplicated: Secondary | ICD-10-CM | POA: Diagnosis not present

## 2020-11-09 DIAGNOSIS — Z743 Need for continuous supervision: Secondary | ICD-10-CM | POA: Diagnosis not present

## 2020-11-09 DIAGNOSIS — M25519 Pain in unspecified shoulder: Secondary | ICD-10-CM | POA: Diagnosis not present

## 2020-11-09 DIAGNOSIS — M19011 Primary osteoarthritis, right shoulder: Secondary | ICD-10-CM | POA: Diagnosis not present

## 2020-11-09 DIAGNOSIS — Z794 Long term (current) use of insulin: Secondary | ICD-10-CM | POA: Diagnosis not present

## 2020-11-09 DIAGNOSIS — M4802 Spinal stenosis, cervical region: Secondary | ICD-10-CM | POA: Diagnosis not present

## 2020-11-09 DIAGNOSIS — S199XXA Unspecified injury of neck, initial encounter: Secondary | ICD-10-CM | POA: Diagnosis not present

## 2020-11-09 DIAGNOSIS — M545 Low back pain, unspecified: Secondary | ICD-10-CM | POA: Diagnosis not present

## 2020-11-09 DIAGNOSIS — M47814 Spondylosis without myelopathy or radiculopathy, thoracic region: Secondary | ICD-10-CM | POA: Diagnosis not present

## 2020-11-09 DIAGNOSIS — M25511 Pain in right shoulder: Secondary | ICD-10-CM | POA: Insufficient documentation

## 2020-11-09 DIAGNOSIS — M542 Cervicalgia: Secondary | ICD-10-CM | POA: Diagnosis not present

## 2020-11-09 LAB — CBC WITH DIFFERENTIAL/PLATELET
Abs Immature Granulocytes: 0.06 10*3/uL (ref 0.00–0.07)
Basophils Absolute: 0 10*3/uL (ref 0.0–0.1)
Basophils Relative: 0 %
Eosinophils Absolute: 0.1 10*3/uL (ref 0.0–0.5)
Eosinophils Relative: 1 %
HCT: 33.5 % — ABNORMAL LOW (ref 36.0–46.0)
Hemoglobin: 10.8 g/dL — ABNORMAL LOW (ref 12.0–15.0)
Immature Granulocytes: 0 %
Lymphocytes Relative: 10 %
Lymphs Abs: 1.4 10*3/uL (ref 0.7–4.0)
MCH: 29.5 pg (ref 26.0–34.0)
MCHC: 32.2 g/dL (ref 30.0–36.0)
MCV: 91.5 fL (ref 80.0–100.0)
Monocytes Absolute: 0.8 10*3/uL (ref 0.1–1.0)
Monocytes Relative: 6 %
Neutro Abs: 11.5 10*3/uL — ABNORMAL HIGH (ref 1.7–7.7)
Neutrophils Relative %: 83 %
Platelets: 322 10*3/uL (ref 150–400)
RBC: 3.66 MIL/uL — ABNORMAL LOW (ref 3.87–5.11)
RDW: 13.1 % (ref 11.5–15.5)
WBC: 13.9 10*3/uL — ABNORMAL HIGH (ref 4.0–10.5)
nRBC: 0 % (ref 0.0–0.2)

## 2020-11-09 LAB — BASIC METABOLIC PANEL
Anion gap: 11 (ref 5–15)
BUN: 22 mg/dL (ref 8–23)
CO2: 26 mmol/L (ref 22–32)
Calcium: 8.6 mg/dL — ABNORMAL LOW (ref 8.9–10.3)
Chloride: 101 mmol/L (ref 98–111)
Creatinine, Ser: 1.24 mg/dL — ABNORMAL HIGH (ref 0.44–1.00)
GFR, Estimated: 43 mL/min — ABNORMAL LOW (ref >60)
Glucose, Bld: 229 mg/dL — ABNORMAL HIGH (ref 70–99)
Potassium: 3.7 mmol/L (ref 3.5–5.1)
Sodium: 138 mmol/L (ref 135–145)

## 2020-11-09 MED ORDER — NAPROXEN 375 MG PO TABS: 375.0000 mg | ORAL_TABLET | Freq: Once | ORAL | Status: DC

## 2020-11-09 MED ORDER — IOHEXOL 350 MG/ML SOLN
80.0000 mL | Freq: Once | INTRAVENOUS | Status: AC | PRN
  Administered 2020-11-09: 80 mL via INTRAVENOUS

## 2020-11-09 MED ORDER — ACETAMINOPHEN-CODEINE #3 300-30 MG PO TABS
1.0000 | ORAL_TABLET | Freq: Once | ORAL | Status: AC
  Administered 2020-11-09: 1 via ORAL
  Filled 2020-11-09: qty 1

## 2020-11-09 MED ORDER — ACETAMINOPHEN 325 MG PO TABS: 650.0000 mg | ORAL_TABLET | Freq: Once | ORAL | Status: DC

## 2020-11-09 MED ORDER — ONDANSETRON HCL 4 MG PO TABS
4.0000 mg | ORAL_TABLET | Freq: Once | ORAL | Status: AC
  Administered 2020-11-09: 4 mg via ORAL
  Filled 2020-11-09: qty 1
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
## 2020-11-10 ENCOUNTER — Other Ambulatory Visit: Payer: Self-pay | Admitting: Family Medicine

## 2020-11-10 DIAGNOSIS — Z1231 Encounter for screening mammogram for malignant neoplasm of breast: Secondary | ICD-10-CM

## 2020-11-11 DIAGNOSIS — M25511 Pain in right shoulder: Secondary | ICD-10-CM | POA: Diagnosis not present

## 2020-11-12 ENCOUNTER — Ambulatory Visit
Admission: RE | Admit: 2020-11-12 | Discharge: 2020-11-12 | Disposition: A | Discharge status: Home / Self Care | Payer: Medicare Other | Source: Ambulatory Visit | Attending: Family Medicine | Admitting: Family Medicine

## 2020-11-12 DIAGNOSIS — Z0389 Encounter for observation for other suspected diseases and conditions ruled out: Secondary | ICD-10-CM | POA: Diagnosis not present

## 2020-11-12 DIAGNOSIS — R229 Localized swelling, mass and lump, unspecified: Secondary | ICD-10-CM

## 2020-11-21 DIAGNOSIS — S301XXA Contusion of abdominal wall, initial encounter: Secondary | ICD-10-CM | POA: Diagnosis not present

## 2020-12-03 DIAGNOSIS — E7800 Pure hypercholesterolemia, unspecified: Secondary | ICD-10-CM | POA: Diagnosis not present

## 2020-12-03 DIAGNOSIS — G8929 Other chronic pain: Secondary | ICD-10-CM | POA: Diagnosis not present

## 2020-12-03 DIAGNOSIS — D509 Iron deficiency anemia, unspecified: Secondary | ICD-10-CM | POA: Diagnosis not present

## 2020-12-03 DIAGNOSIS — M810 Age-related osteoporosis without current pathological fracture: Secondary | ICD-10-CM | POA: Diagnosis not present

## 2020-12-03 DIAGNOSIS — N1832 Chronic kidney disease, stage 3b: Secondary | ICD-10-CM | POA: Diagnosis not present

## 2020-12-03 DIAGNOSIS — H35033 Hypertensive retinopathy, bilateral: Secondary | ICD-10-CM | POA: Diagnosis not present

## 2020-12-03 DIAGNOSIS — K219 Gastro-esophageal reflux disease without esophagitis: Secondary | ICD-10-CM | POA: Diagnosis not present

## 2020-12-03 DIAGNOSIS — I10 Essential (primary) hypertension: Secondary | ICD-10-CM | POA: Diagnosis not present

## 2020-12-03 DIAGNOSIS — E1169 Type 2 diabetes mellitus with other specified complication: Secondary | ICD-10-CM | POA: Diagnosis not present

## 2020-12-03 DIAGNOSIS — E890 Postprocedural hypothyroidism: Secondary | ICD-10-CM | POA: Diagnosis not present

## 2020-12-24 ENCOUNTER — Ambulatory Visit
Admission: RE | Admit: 2020-12-24 | Discharge: 2020-12-24 | Disposition: A | Discharge status: Home / Self Care | Payer: Medicare Other | Source: Ambulatory Visit | Attending: Family Medicine | Admitting: Family Medicine

## 2020-12-24 DIAGNOSIS — Z1231 Encounter for screening mammogram for malignant neoplasm of breast: Secondary | ICD-10-CM | POA: Diagnosis not present

## 2021-01-01 DIAGNOSIS — E1169 Type 2 diabetes mellitus with other specified complication: Secondary | ICD-10-CM | POA: Diagnosis not present

## 2021-01-01 DIAGNOSIS — I10 Essential (primary) hypertension: Secondary | ICD-10-CM | POA: Diagnosis not present

## 2021-01-01 DIAGNOSIS — K219 Gastro-esophageal reflux disease without esophagitis: Secondary | ICD-10-CM | POA: Diagnosis not present

## 2021-01-01 DIAGNOSIS — E7800 Pure hypercholesterolemia, unspecified: Secondary | ICD-10-CM | POA: Diagnosis not present

## 2021-01-01 DIAGNOSIS — H35033 Hypertensive retinopathy, bilateral: Secondary | ICD-10-CM | POA: Diagnosis not present

## 2021-01-01 DIAGNOSIS — M810 Age-related osteoporosis without current pathological fracture: Secondary | ICD-10-CM | POA: Diagnosis not present

## 2021-01-01 DIAGNOSIS — N1832 Chronic kidney disease, stage 3b: Secondary | ICD-10-CM | POA: Diagnosis not present

## 2021-01-01 DIAGNOSIS — E890 Postprocedural hypothyroidism: Secondary | ICD-10-CM | POA: Diagnosis not present

## 2021-01-01 DIAGNOSIS — G8929 Other chronic pain: Secondary | ICD-10-CM | POA: Diagnosis not present

## 2021-02-06 ENCOUNTER — Ambulatory Visit (INDEPENDENT_AMBULATORY_CARE_PROVIDER_SITE_OTHER): Payer: Medicare Other | Admitting: Podiatry

## 2021-02-06 ENCOUNTER — Encounter: Payer: Self-pay | Admitting: Podiatry

## 2021-02-06 ENCOUNTER — Other Ambulatory Visit: Payer: Self-pay

## 2021-02-06 DIAGNOSIS — M2141 Flat foot [pes planus] (acquired), right foot: Secondary | ICD-10-CM | POA: Diagnosis not present

## 2021-02-06 DIAGNOSIS — R600 Localized edema: Secondary | ICD-10-CM | POA: Diagnosis not present

## 2021-02-06 DIAGNOSIS — M2010 Hallux valgus (acquired), unspecified foot: Secondary | ICD-10-CM | POA: Diagnosis not present

## 2021-02-06 DIAGNOSIS — M79675 Pain in left toe(s): Secondary | ICD-10-CM

## 2021-02-06 DIAGNOSIS — M2142 Flat foot [pes planus] (acquired), left foot: Secondary | ICD-10-CM | POA: Diagnosis not present

## 2021-02-06 DIAGNOSIS — E119 Type 2 diabetes mellitus without complications: Secondary | ICD-10-CM

## 2021-02-06 DIAGNOSIS — B351 Tinea unguium: Secondary | ICD-10-CM

## 2021-02-06 DIAGNOSIS — E1142 Type 2 diabetes mellitus with diabetic polyneuropathy: Secondary | ICD-10-CM

## 2021-02-06 DIAGNOSIS — M79674 Pain in right toe(s): Secondary | ICD-10-CM | POA: Diagnosis not present

## 2021-02-06 MED ORDER — DICLOFENAC SODIUM 1 % EX GEL: 2.0000 g | Freq: Four times a day (QID) | CUTANEOUS | 4 refills | Status: AC

## 2021-02-06 NOTE — Progress Notes (Signed)
ANNUAL DIABETIC FOOT EXAM  Subjective: [name] presents today for for annual diabetic foot examination.  Patient relates h/o diabetes.  Patient denies any h/o foot wounds.  Patient has been diagnosed with neuropathy and it is managed with pregabalin.  Patient's blood sugar was 183 mg/dl today.   She is requesting refill of Voltaren Gel for her feet.  This helps with her neuropathic pain.  Risk factors:  former smoker, diabetes, diabetic neuropathy, CKD, dyslipidemia, h/o CVA, HTN.  [name], [name], MD is patient's PCP. Last visit was August 12, 2020.  Past Medical History:  Diagnosis Date   Anemia    Asthma    Chest pain, atypical    Diabetes mellitus    GERD (gastroesophageal reflux disease)    History of transient ischemic attack (TIA)    Hyperlipidemia    Hypertension    OA (osteoarthritis)    Thyroid carcinoma ([place])    Patient Active Problem List   Diagnosis Date Noted   Hypocalcemia 10/24/2020   Malignant tumor of thyroid gland ([place]) 10/24/2020   Age-related osteoporosis without current pathological fracture 06/13/2020   CVA, old, speech/language deficit 06/13/2020   Generalized abdominal pain 06/13/2020   Long term (current) use of insulin ([place]) 06/13/2020   Osteoporosis without current pathological fracture 06/13/2020   Pure hypercholesterolemia 06/13/2020   Pain in joint of right shoulder 06/26/2019   Iron deficiency anemia, unspecified 03/14/2018   Chronic pain syndrome 11/04/2017   Thoracic radiculopathy 11/04/2017   Pes anserinus bursitis of right knee 08/12/2017   GERD without esophagitis 05/12/2017   Pleuritic chest pain 05/04/2017   Medication monitoring encounter 05/04/2017   Dyslipidemia associated with type 2 diabetes mellitus ([place]) 04/20/2017   Hypertension associated with diabetes ([place]) 04/20/2017   Cerebrovascular accident (CVA) with involvement of right side of body ([place]) 04/20/2017   Peripheral autonomic neuropathy due to diabetes  mellitus ([place]) 04/20/2017   Hypertensive renal disease with renal failure, stage 1-4 or unspecified chronic kidney disease 04/20/2017   Constipation 04/06/2017   Status post lumbar spinal fusion 04/03/2017   Recurrent UTI 04/03/2017   Chronic renal insufficiency 04/03/2017   Normocytic anemia 04/03/2017   Osteomyelitis of thoracic spine ([place]) 04/02/2017   Thoracic discitis 04/02/2017   DDD (degenerative disc disease), lumbar 12/31/2016   Narcotic dependence ([place]) 10/24/2015   Periprosthetic fracture around internal prosthetic right knee joint 04/25/2015   Fracture, femur, distal, right, closed, initial encounter 04/24/2015   Closed fracture of distal end of right femur, initial encounter ([place]) 04/24/2015   Acute gout 10/17/2013   Hypoparathyroidism ([place]) 03/02/2012   Benign essential tremor 09/28/2010   Combined hyperlipidemia associated with type 2 diabetes mellitus ([place]) 09/26/2009   Osteopenia 09/26/2009   Postoperative hypothyroidism 09/26/2009   Multiple pulmonary nodules 08/22/2009   OSTEOARTHRITIS, KNEES, BILATERAL 08/07/2009   Allergic rhinitis 11/19/2008   CVA, old, hemiparesis ([place]) 10/16/2008   History of thyroid cancer 09/26/2008   Spinal stenosis of lumbar region 03/26/2008   Hypothyroidism 07/03/2007   Insulin dependent type 2 diabetes mellitus, controlled ([place]) 07/03/2007   Spinal epidural abscess 07/03/2007   Benign essential HTN 07/03/2007   PSOAS MUSCLE ABSCESS 06/09/2007   Past Surgical History:  Procedure Laterality Date   BACK SURGERY     BIOPSY  03/14/2018   Procedure: BIOPSY;  Surgeon: Wilford Corner, MD;  Location: [facility];  Service: Endoscopy;;  EGD and Colon   BREAST BIOPSY     CARDIAC CATHETERIZATION  03/18/2009   NORMAL LEFT VENTRICULAR SIZE AND CONTRACTILITY  WITH NORMAL SYSTOLIC  FUNCTION. EF 60%   CARDIOLITE STUDY     SHOWED A SIGNIFICANT REVERSIBLE ANTERIOR WALL DEFECT CONSISTENT WITH ISCHEMIA. EF 55%   COLONOSCOPY WITH PROPOFOL N/A  03/14/2018   Procedure: COLONOSCOPY WITH PROPOFOL;  Surgeon: Wilford Corner, MD;  Location: [facility];  Service: Endoscopy;  Laterality: N/A;   ESOPHAGOGASTRODUODENOSCOPY (EGD) WITH PROPOFOL N/A 03/14/2018   Procedure: ESOPHAGOGASTRODUODENOSCOPY (EGD) WITH PROPOFOL;  Surgeon: Wilford Corner, MD;  Location: [facility];  Service: Endoscopy;  Laterality: N/A;   FEMUR IM NAIL Right 04/25/2015   Procedure: INTRAMEDULLARY (IM) RIGHT  RETROGRADE FEMORAL NAILING;  Surgeon: Rod Can, MD;  Location: [facility];  Service: Orthopedics;  Laterality: Right;   IR FL GUIDED LOC OF NEEDLE/CATH TIP FOR SPINAL INJECTION RT  04/05/2017   IR FLUORO GUIDE CV LINE RIGHT  04/08/2017   IR US GUIDE VASC ACCESS RIGHT  04/08/2017   LUMBAR FUSION     POLYPECTOMY  03/14/2018   Procedure: POLYPECTOMY;  Surgeon: Wilford Corner, MD;  Location: [facility];  Service: Endoscopy;;   THYROIDECTOMY     TOTAL KNEE ARTHROPLASTY     right   Current Outpatient Medications on File Prior to Visit  Medication Sig Dispense Refill   ACETAMINOPHEN EXTRA STRENGTH PO 2 tablets     Acetaminophen-Codeine 300-30 MG tablet acetaminophen 300 mg-codeine 30 mg tablet  Take 1 tablet as needed by oral route at bedtime for 20 days.     albuterol (VENTOLIN HFA) 108 (90 Base) MCG/ACT inhaler 1 puff as needed     amLODipine (NORVASC) 5 MG tablet Take 5 mg by mouth daily.     amLODipine (NORVASC) 5 MG tablet 1 tablet     atorvastatin (LIPITOR) 20 MG tablet Take 20 mg by mouth daily.   0   atorvastatin (LIPITOR) 20 MG tablet Take 1 tablet by mouth daily.     azithromycin (ZITHROMAX) 250 MG tablet Take 250 mg by mouth as directed.     B-D UF III MINI PEN NEEDLES 31G X 5 MM MISC Inject into the skin daily.     benzonatate (TESSALON) 100 MG capsule 1 capsule as needed     Blood Glucose Monitoring Suppl (ONETOUCH VERIO REFLECT) w/Device KIT 2 (two) times daily. as directed     Blood Pressure Monitor DEVI See admin instructions.     calcitRIOL  (ROCALTROL) 0.25 MCG capsule Take 0.25 mcg by mouth 2 (two) times daily.      calcitRIOL (ROCALTROL) 0.25 MCG capsule 1 capsule     Calcium Carb-Cholecalciferol (CALCIUM-VITAMIN D) 500-200 MG-UNIT tablet Take by mouth.     calcium-vitamin D (OSCAL WITH D) 500-200 MG-UNIT per tablet Take 1 tablet by mouth daily with breakfast.     Cholecalciferol (VITAMIN D3) 1.25 MG (50000 UT) CAPS Take 1 capsule by mouth once a week.     clotrimazole-betamethasone (LOTRISONE) cream Apply topically 2 (two) times daily.     colchicine 0.6 MG tablet Take by mouth.     dipyridamole-aspirin (AGGRENOX) 200-25 MG 12hr capsule 1 capsule     dipyridamole-aspirin (AGGRENOX) 25-200 MG per 12 hr capsule Take 1 capsule by mouth 2 (two) times daily.      famotidine (PEPCID) 20 MG tablet Take 20 mg by mouth daily as needed for heartburn or indigestion.     ferrous sulfate 325 (65 FE) MG tablet Take 325 mg by mouth daily with breakfast.     fluticasone (FLONASE) 50 MCG/ACT nasal spray Place 1 spray into both nostrils daily.  FLUZONE HIGH-DOSE QUADRIVALENT 0.7 ML SUSY      furosemide (LASIX) 20 MG tablet Take 1 tablet by mouth every other day.     glucose blood (ONETOUCH VERIO) test strip See admin instructions.     guaiFENesin-codeine 100-10 MG/5ML syrup Take by mouth.     HYDROcodone-acetaminophen (NORCO/VICODIN) 5-325 MG tablet Take 0.5-1 tablets by mouth 2 (two) times daily as needed for moderate pain.     hydrocortisone (ANUSOL-HC) 2.5 % rectal cream 1 application to affected area     Insulin NPH, Human,, Isophane, (HUMULIN N KWIKPEN) 100 UNIT/ML Kiwkpen INJECT 10 UNITS UNDER THE SKIN EVERY MORNING AS DIRECTED     ipratropium (ATROVENT) 0.06 % nasal spray Place 2 sprays into both nostrils 4 (four) times daily.     levothyroxine (SYNTHROID) 137 MCG tablet 1 tablet on an empty stomach in the morning     levothyroxine (SYNTHROID, LEVOTHROID) 125 MCG tablet Take 125 mcg by mouth daily before breakfast.     lisinopril  (PRINIVIL,ZESTRIL) 40 MG tablet Take 40 mg by mouth daily.      lisinopril (ZESTRIL) 20 MG tablet Take 20 mg by mouth daily.     loratadine-pseudoephedrine (CLARITIN-D 24 HOUR) 10-240 MG 24 hr tablet 1 tablet as needed     meclizine (ANTIVERT) 12.5 MG tablet Take 12.5 mg by mouth daily as needed for dizziness.     mometasone (ELOCON) 0.1 % ointment Apply topically.     montelukast (SINGULAIR) 10 MG tablet Take 10 mg by mouth daily as needed.  5   NON [facility]  Creams-#11     nystatin ointment (MYCOSTATIN) Apply 1 application topically 2 (two) times daily as needed for itching.  0   OneTouch Delica Lancets 58K MISC See admin instructions.     pantoprazole (PROTONIX) 40 MG tablet Take 40 mg by mouth daily.     pantoprazole (PROTONIX) 40 MG tablet 1 tablet     PFIZER COVID-19 VAC BIVALENT injection      polyethylene glycol (MIRALAX / GLYCOLAX) packet Take 17 g by mouth daily as needed for moderate constipation.      Polyethylene Glycol 3350 POWD 527 gram bottle 1 capfull of powder mixed with 8 ounces of liquid     predniSONE (DELTASONE) 10 MG tablet See admin instructions.     pregabalin (LYRICA) 100 MG capsule Take 1 capsule (100 mg total) by mouth every 8 (eight) hours. 90 capsule 0   pregabalin (LYRICA) 100 MG capsule 1 capsule     RESTASIS 0.05 % ophthalmic emulsion 1 drop 2 (two) times daily.     senna (SENOKOT) 8.6 MG TABS tablet Take 1 tablet by mouth 2 (two) times daily as needed for mild constipation.     trimethoprim (TRIMPEX) 100 MG tablet Take 100 mg by mouth daily.     valACYclovir (VALTREX) 1000 MG tablet 1 tablet     [DISCONTINUED] escitalopram (LEXAPRO) 10 MG tablet Take 10 mg by mouth daily.       No current facility-administered medications on file prior to visit.    Allergies  Allergen Reactions   Pioglitazone Swelling    Other reaction(s): swelling   Actos [Pioglitazone Hydrochloride] Swelling   Amlodipine Besylate     Other reaction(s): Unknown    Cephalexin Other (See Comments)    Doesn't remember  Other reaction(s): unknown   Lorazepam     swelling Other reaction(s): swelling   Oxycodone     This medication makes her feel sick   Oxycodone-Acetaminophen  Other reaction(s): vomiting   Penicillin G Benzathine     Other reaction(s): Unknown   Rosiglitazone     unknown Other reaction(s): unknown   Tramadol Other (See Comments)    hallucinations   Tramadol Hcl     Other reaction(s): hallucinations   Social History   Occupational History    Employer: RETIRED  Tobacco Use   Smoking status: Former   Smokeless tobacco: Never  Scientific laboratory technician Use: Never used  Substance and Sexual Activity   Alcohol use: No   Drug use: No   Sexual activity: Not on file   Family History  Problem Relation Age of Onset   Pneumonia Mother    Heart attack Father    Breast cancer Neg Hx    Immunization History  Administered Date(s) Administered   DT (Pediatric) 05/20/2008   Influenza Split 11/19/2008, 12/01/2009   Influenza,inj,Quad PF,6+ Mos 10/08/2014   Influenza,inj,quad, With Preservative 10/04/2016   Influenza-Unspecified 09/28/2010, 11/01/2011, 10/16/2012, 10/17/2013, 10/01/2014, 10/24/2015, 12/31/2016   PPD Test 04/20/2017, 05/04/2017   Pneumococcal Conjugate-13 02/15/2014   Pneumococcal-Unspecified 10/05/2003, 01/04/2005, 01/04/2006   Td 05/20/2008   Tdap 10/07/2009   Zoster Recombinat (Shingrix) 01/12/2017   Zoster, Live 09/26/2009, 10/07/2012, 04/16/2016     Review of Systems: Negative except as noted in the HPI.   Objective: There were no vitals filed for this visit.  [name] is a pleasant 81 y.o. female in NAD. AAO X 3.  Vascular Examination: CFT <3 seconds b/l LE. Palpable pedal pulses b/l LE. Pedal hair absent. No pain with calf compression b/l. Lower extremity skin temperature gradient within normal limits. +1 pitting edema noted BLE. No cyanosis or clubbing noted b/l LE.  Dermatological  Examination: Pedal integument with normal turgor, texture and tone BLE. No open wounds b/l LE. No interdigital macerations noted b/l LE. Toenails 2-5 left and 2-5 right elongated, discolored, dystrophic, thickened, and crumbly with subungual debris and tenderness to dorsal palpation. Anonychia noted bilateral great toes. Nailbed(s) epithelialized.   Musculoskeletal Examination: Muscle strength 5/5 to all lower extremity muscle groups bilaterally. No pain, crepitus or joint limitation noted with ROM bilateral LE. HAV with bunion deformity noted b/l LE. Pes planus deformity noted bilateral LE.  Footwear Assessment: Does the patient wear appropriate shoes? Yes. Does the patient need inserts/orthotics? Yes.  Neurological Examination: Protective sensation diminished with 10g monofilament b/l. Vibratory sensation decreased b/l.  Assessment: 1. Pain due to onychomycosis of toenails of both feet   2. Hallux valgus, acquired, unspecified laterality   3. Pes planus of both feet   4. Lower extremity edema   5. Diabetic peripheral neuropathy associated with type 2 diabetes mellitus ([place])   6. Encounter for diabetic foot exam ([place])     ADA Risk Categorization: High Risk  Patient has one or more of the following: Loss of protective sensation Absent pedal pulses Severe Foot deformity History of foot ulcer  Plan: -Diabetic foot examination performed today. -Continue foot and shoe inspections daily. Monitor blood glucose per PCP/Endocrinologist's recommendations. -Mycotic toenails 2-5 bilaterally were debrided in length and girth with sterile nail nippers and dremel without iatrogenic bleeding. -Rx sent for diclofenac gel 1%. -Patient/POA to call should there be question/concern in the interim.  Return in about 3 months (around 05/06/2021).  Marzetta Board, DPM

## 2021-02-10 DIAGNOSIS — C73 Malignant neoplasm of thyroid gland: Secondary | ICD-10-CM | POA: Diagnosis not present

## 2021-02-10 DIAGNOSIS — E1165 Type 2 diabetes mellitus with hyperglycemia: Secondary | ICD-10-CM | POA: Diagnosis not present

## 2021-02-10 DIAGNOSIS — E890 Postprocedural hypothyroidism: Secondary | ICD-10-CM | POA: Diagnosis not present

## 2021-02-16 DIAGNOSIS — N189 Chronic kidney disease, unspecified: Secondary | ICD-10-CM | POA: Diagnosis not present

## 2021-02-16 DIAGNOSIS — I10 Essential (primary) hypertension: Secondary | ICD-10-CM | POA: Diagnosis not present

## 2021-02-16 DIAGNOSIS — E1165 Type 2 diabetes mellitus with hyperglycemia: Secondary | ICD-10-CM | POA: Diagnosis not present

## 2021-02-16 DIAGNOSIS — C73 Malignant neoplasm of thyroid gland: Secondary | ICD-10-CM | POA: Diagnosis not present

## 2021-02-16 DIAGNOSIS — E892 Postprocedural hypoparathyroidism: Secondary | ICD-10-CM | POA: Diagnosis not present

## 2021-02-16 DIAGNOSIS — E890 Postprocedural hypothyroidism: Secondary | ICD-10-CM | POA: Diagnosis not present

## 2021-02-25 DIAGNOSIS — E7800 Pure hypercholesterolemia, unspecified: Secondary | ICD-10-CM | POA: Diagnosis not present

## 2021-02-25 DIAGNOSIS — N1832 Chronic kidney disease, stage 3b: Secondary | ICD-10-CM | POA: Diagnosis not present

## 2021-02-25 DIAGNOSIS — I10 Essential (primary) hypertension: Secondary | ICD-10-CM | POA: Diagnosis not present

## 2021-02-25 DIAGNOSIS — E890 Postprocedural hypothyroidism: Secondary | ICD-10-CM | POA: Diagnosis not present

## 2021-02-25 DIAGNOSIS — E1169 Type 2 diabetes mellitus with other specified complication: Secondary | ICD-10-CM | POA: Diagnosis not present

## 2021-03-11 DIAGNOSIS — H524 Presbyopia: Secondary | ICD-10-CM | POA: Diagnosis not present

## 2021-03-11 DIAGNOSIS — H43812 Vitreous degeneration, left eye: Secondary | ICD-10-CM | POA: Diagnosis not present

## 2021-03-11 DIAGNOSIS — H26492 Other secondary cataract, left eye: Secondary | ICD-10-CM | POA: Diagnosis not present

## 2021-03-11 DIAGNOSIS — H35033 Hypertensive retinopathy, bilateral: Secondary | ICD-10-CM | POA: Diagnosis not present

## 2021-03-18 DIAGNOSIS — M810 Age-related osteoporosis without current pathological fracture: Secondary | ICD-10-CM | POA: Diagnosis not present

## 2021-03-18 DIAGNOSIS — E113293 Type 2 diabetes mellitus with mild nonproliferative diabetic retinopathy without macular edema, bilateral: Secondary | ICD-10-CM | POA: Diagnosis not present

## 2021-03-18 DIAGNOSIS — I69328 Other speech and language deficits following cerebral infarction: Secondary | ICD-10-CM | POA: Diagnosis not present

## 2021-03-18 DIAGNOSIS — I700 Atherosclerosis of aorta: Secondary | ICD-10-CM | POA: Diagnosis not present

## 2021-03-18 DIAGNOSIS — E7800 Pure hypercholesterolemia, unspecified: Secondary | ICD-10-CM | POA: Diagnosis not present

## 2021-03-18 DIAGNOSIS — Z0000 Encounter for general adult medical examination without abnormal findings: Secondary | ICD-10-CM | POA: Diagnosis not present

## 2021-03-18 DIAGNOSIS — E890 Postprocedural hypothyroidism: Secondary | ICD-10-CM | POA: Diagnosis not present

## 2021-03-18 DIAGNOSIS — E209 Hypoparathyroidism, unspecified: Secondary | ICD-10-CM | POA: Diagnosis not present

## 2021-03-18 DIAGNOSIS — I10 Essential (primary) hypertension: Secondary | ICD-10-CM | POA: Diagnosis not present

## 2021-03-18 DIAGNOSIS — H35033 Hypertensive retinopathy, bilateral: Secondary | ICD-10-CM | POA: Diagnosis not present

## 2021-03-18 DIAGNOSIS — Z79899 Other long term (current) drug therapy: Secondary | ICD-10-CM | POA: Diagnosis not present

## 2021-03-18 DIAGNOSIS — N1832 Chronic kidney disease, stage 3b: Secondary | ICD-10-CM | POA: Diagnosis not present

## 2021-03-28 DIAGNOSIS — M5431 Sciatica, right side: Secondary | ICD-10-CM | POA: Diagnosis not present

## 2021-03-31 DIAGNOSIS — E890 Postprocedural hypothyroidism: Secondary | ICD-10-CM | POA: Diagnosis not present

## 2021-03-31 DIAGNOSIS — E892 Postprocedural hypoparathyroidism: Secondary | ICD-10-CM | POA: Diagnosis not present

## 2021-05-05 DIAGNOSIS — M48062 Spinal stenosis, lumbar region with neurogenic claudication: Secondary | ICD-10-CM | POA: Diagnosis not present

## 2021-05-05 DIAGNOSIS — M5136 Other intervertebral disc degeneration, lumbar region: Secondary | ICD-10-CM | POA: Diagnosis not present

## 2021-05-06 ENCOUNTER — Other Ambulatory Visit: Payer: Self-pay | Admitting: Neurosurgery

## 2021-05-06 DIAGNOSIS — M48062 Spinal stenosis, lumbar region with neurogenic claudication: Secondary | ICD-10-CM

## 2021-05-08 ENCOUNTER — Ambulatory Visit: Payer: Medicare Other | Admitting: Podiatry

## 2021-05-15 ENCOUNTER — Ambulatory Visit
Admission: RE | Admit: 2021-05-15 | Discharge: 2021-05-15 | Disposition: A | Discharge status: Home / Self Care | Payer: Medicare Other | Source: Ambulatory Visit | Attending: Neurosurgery | Admitting: Neurosurgery

## 2021-05-15 DIAGNOSIS — M48061 Spinal stenosis, lumbar region without neurogenic claudication: Secondary | ICD-10-CM | POA: Diagnosis not present

## 2021-05-15 DIAGNOSIS — M48062 Spinal stenosis, lumbar region with neurogenic claudication: Secondary | ICD-10-CM

## 2021-05-15 DIAGNOSIS — R200 Anesthesia of skin: Secondary | ICD-10-CM | POA: Diagnosis not present

## 2021-05-15 DIAGNOSIS — M545 Low back pain, unspecified: Secondary | ICD-10-CM | POA: Diagnosis not present

## 2021-05-15 DIAGNOSIS — M4316 Spondylolisthesis, lumbar region: Secondary | ICD-10-CM | POA: Diagnosis not present

## 2021-05-17 ENCOUNTER — Other Ambulatory Visit: Payer: Medicare Other

## 2021-05-26 DIAGNOSIS — M48062 Spinal stenosis, lumbar region with neurogenic claudication: Secondary | ICD-10-CM | POA: Diagnosis not present

## 2021-05-26 DIAGNOSIS — M5136 Other intervertebral disc degeneration, lumbar region: Secondary | ICD-10-CM | POA: Diagnosis not present

## 2021-06-03 ENCOUNTER — Encounter: Payer: Self-pay | Admitting: Podiatry

## 2021-06-03 ENCOUNTER — Ambulatory Visit: Payer: Medicare Other | Admitting: Podiatry

## 2021-06-03 DIAGNOSIS — R600 Localized edema: Secondary | ICD-10-CM | POA: Diagnosis not present

## 2021-06-03 DIAGNOSIS — M79675 Pain in left toe(s): Secondary | ICD-10-CM | POA: Diagnosis not present

## 2021-06-03 DIAGNOSIS — M79674 Pain in right toe(s): Secondary | ICD-10-CM | POA: Diagnosis not present

## 2021-06-03 DIAGNOSIS — M2142 Flat foot [pes planus] (acquired), left foot: Secondary | ICD-10-CM | POA: Diagnosis not present

## 2021-06-03 DIAGNOSIS — M2141 Flat foot [pes planus] (acquired), right foot: Secondary | ICD-10-CM | POA: Diagnosis not present

## 2021-06-03 DIAGNOSIS — M2010 Hallux valgus (acquired), unspecified foot: Secondary | ICD-10-CM | POA: Diagnosis not present

## 2021-06-03 DIAGNOSIS — E1142 Type 2 diabetes mellitus with diabetic polyneuropathy: Secondary | ICD-10-CM | POA: Diagnosis not present

## 2021-06-03 DIAGNOSIS — B351 Tinea unguium: Secondary | ICD-10-CM | POA: Diagnosis not present

## 2021-06-03 DIAGNOSIS — I872 Venous insufficiency (chronic) (peripheral): Secondary | ICD-10-CM | POA: Diagnosis not present

## 2021-06-07 NOTE — Progress Notes (Signed)
Subjective:  Patient ID: [name], female    DOB: 09-16-1940,  MRN: [id number]  [name] presents to clinic today for at risk foot care with history of diabetic neuropathy and painful elongated mycotic toenails 1-5 bilaterally which are tender when wearing enclosed shoe gear. Pain is relieved with periodic professional debridement.  Last known HgA1c was unknown. Patient did not check blood glucose today and hasn't taken it in two days.  New problem(s):  Patient c/o chronic lower extremity swelling. States she has not seen a specialist for this. Legs feel heavy at times and walking is limited to short distances. She does have DJD of lumbar spine and is s/p  lumbar spinal fusion.  PCP is [name], [name], MD , and last visit was January 01, 2022.  Allergies  Allergen Reactions   Pioglitazone Swelling    Other reaction(s): swelling Other reaction(s): Unknown   Actos [Pioglitazone Hydrochloride] Swelling   Amlodipine Besylate     Other reaction(s): Unknown Other reaction(s): Unknown   Cephalexin Other (See Comments)    Doesn't remember  Other reaction(s): unknown   Lorazepam     swelling Other reaction(s): swelling   Oxycodone     This medication makes her feel sick   Oxycodone-Acetaminophen     Other reaction(s): vomiting   Penicillin G Benzathine     Other reaction(s): Unknown Other reaction(s): Unknown   Rosiglitazone     unknown Other reaction(s): unknown Other reaction(s): Unknown   Tramadol Other (See Comments)    hallucinations   Tramadol Hcl     Other reaction(s): hallucinations    Review of Systems: Negative except as noted in the HPI.  Objective: No changes noted in today's physical examination. [name] is a pleasant 81 y.o. female in NAD. AAO X 3.  Vascular Examination: CFT <3 seconds b/l LE. Palpable pedal pulses b/l LE. Pedal hair absent. No pain with calf compression b/l. Lower extremity skin temperature gradient within normal limits.  +1 pitting edema noted BLE. No cyanosis or clubbing noted b/l LE.  Dermatological Examination: Pedal integument with normal turgor, texture and tone BLE. No open wounds b/l LE. No interdigital macerations noted b/l LE. Toenails 2-5 left and 2-5 right elongated, discolored, dystrophic, thickened, and crumbly with subungual debris and tenderness to dorsal palpation. Anonychia noted bilateral great toes. Nailbed(s) epithelialized.   Musculoskeletal Examination: Muscle strength 5/5 to all lower extremity muscle groups bilaterally. No pain, crepitus or joint limitation noted with ROM bilateral LE. HAV with bunion deformity noted b/l LE. Pes planus deformity noted bilateral LE.  Neurological Examination: Protective sensation diminished with 10g monofilament b/l. Vibratory sensation decreased b/l.  Assessment/Plan: 1. Pain due to onychomycosis of toenails of both feet   2. Lower extremity edema   3. Venous (peripheral) insufficiency   4. Hallux valgus, acquired, unspecified laterality   5. Pes planus of both feet   6. Diabetic peripheral neuropathy associated with type 2 diabetes mellitus ([place])     -Patient was evaluated and treated. All patient's and/or POA's questions/concerns answered on today's visit. -Patient to continue soft, supportive shoe gear daily. Order entered for one pair extra depth shoes and 3 pair heat moldable insoles. Patient qualifies based on diagnoses. -Toenails 2-5 bilaterally debrided in length and girth without iatrogenic bleeding with sterile nail nipper and dremel.  -Vascular Surgery consultation ordered today for evaluation/treatment of venous insufficiency with bilateral lower extremity edema. -Patient/POA to call should there be question/concern in the interim.   Return in about 3  months (around 09/03/2021).  Marzetta Board, DPM

## 2021-06-10 ENCOUNTER — Other Ambulatory Visit: Payer: Medicare Other

## 2021-06-12 ENCOUNTER — Telehealth: Payer: Self-pay

## 2021-06-12 NOTE — Telephone Encounter (Signed)
CMN submitted ?

## 2021-06-15 ENCOUNTER — Ambulatory Visit: Payer: Medicare Other

## 2021-06-15 DIAGNOSIS — M2141 Flat foot [pes planus] (acquired), right foot: Secondary | ICD-10-CM

## 2021-06-15 DIAGNOSIS — M2010 Hallux valgus (acquired), unspecified foot: Secondary | ICD-10-CM

## 2021-06-15 DIAGNOSIS — E1142 Type 2 diabetes mellitus with diabetic polyneuropathy: Secondary | ICD-10-CM

## 2021-06-15 NOTE — Progress Notes (Signed)
SITUATION Reason for Consult: Evaluation for Prefabricated Diabetic Shoes and Custom Diabetic Inserts. Patient / Caregiver Report: Patient would like well fitting shoes  OBJECTIVE DATA: Patient History / Diagnosis:    ICD-10-CM   1. Diabetic peripheral neuropathy associated with type 2 diabetes mellitus (HCC)  E11.42     2. Hallux valgus, acquired, unspecified laterality  M20.10     3. Pes planus of both feet  M21.41    M21.42       Physician Treating Diabetes:  [name] [name]  Current or Previous Devices:   Historical user  In-Person Foot Examination: Ulcers & Callousing:   None Deformities:    Pes Planus Sensation:    Compromised  Shoe Size:     51M  ORTHOTIC RECOMMENDATION Recommended Devices: - 1x pair prefabricated PDAC approved diabetic shoes; Patient Selected [employer] [place]  Size 51M - 3x pair custom-to-patient PDAC approved vacuum formed diabetic insoles.  GOALS OF SHOES AND INSOLES - Reduce shear and pressure - Reduce / Prevent callus formation - Reduce / Prevent ulceration - Protect the fragile healing compromised diabetic foot.  Patient would benefit from diabetic shoes and inserts as patient has diabetes mellitus and the patient has one or more of the following conditions: - History of partial or complete amputation of the foot - History of previous foot ulceration. - History of pre-ulcerative callus - Peripheral neuropathy with evidence of callus formation - Foot deformity - Poor circulation  ACTIONS PERFORMED Potential out of pocket cost was communicated to patient. Patient understood and consented to measurement and casting. Patient was casted for insoles via crush box and measured for shoes via brannock device. Procedure was explained and patient tolerated procedure well. All questions were answered and concerns addressed. Casts were shipped to central fabrication for HOLD until Certificate of Medical Necessity or otherwise necessary  authorization from insurance is obtained.  PLAN Shoes are to be ordered and casts released from hold once all appropriate paperwork is complete. Patient is to be contacted and scheduled for fitting once shoes and insoles have been fabricated and received.

## 2021-06-25 DIAGNOSIS — N1832 Chronic kidney disease, stage 3b: Secondary | ICD-10-CM | POA: Diagnosis not present

## 2021-06-25 DIAGNOSIS — B356 Tinea cruris: Secondary | ICD-10-CM | POA: Diagnosis not present

## 2021-06-25 DIAGNOSIS — R600 Localized edema: Secondary | ICD-10-CM | POA: Diagnosis not present

## 2021-06-25 DIAGNOSIS — K648 Other hemorrhoids: Secondary | ICD-10-CM | POA: Diagnosis not present

## 2021-07-22 DIAGNOSIS — H6123 Impacted cerumen, bilateral: Secondary | ICD-10-CM | POA: Diagnosis not present

## 2021-07-22 DIAGNOSIS — G8929 Other chronic pain: Secondary | ICD-10-CM | POA: Diagnosis not present

## 2021-07-22 DIAGNOSIS — R3915 Urgency of urination: Secondary | ICD-10-CM | POA: Diagnosis not present

## 2021-07-22 DIAGNOSIS — M549 Dorsalgia, unspecified: Secondary | ICD-10-CM | POA: Diagnosis not present

## 2021-07-22 DIAGNOSIS — R350 Frequency of micturition: Secondary | ICD-10-CM | POA: Diagnosis not present

## 2021-08-10 DIAGNOSIS — E892 Postprocedural hypoparathyroidism: Secondary | ICD-10-CM | POA: Diagnosis not present

## 2021-08-10 DIAGNOSIS — E1169 Type 2 diabetes mellitus with other specified complication: Secondary | ICD-10-CM | POA: Diagnosis not present

## 2021-08-10 DIAGNOSIS — D649 Anemia, unspecified: Secondary | ICD-10-CM | POA: Diagnosis not present

## 2021-08-10 DIAGNOSIS — Z01818 Encounter for other preprocedural examination: Secondary | ICD-10-CM | POA: Diagnosis not present

## 2021-08-10 DIAGNOSIS — E1165 Type 2 diabetes mellitus with hyperglycemia: Secondary | ICD-10-CM | POA: Diagnosis not present

## 2021-08-10 DIAGNOSIS — I700 Atherosclerosis of aorta: Secondary | ICD-10-CM | POA: Diagnosis not present

## 2021-08-10 DIAGNOSIS — M5136 Other intervertebral disc degeneration, lumbar region: Secondary | ICD-10-CM | POA: Diagnosis not present

## 2021-08-10 DIAGNOSIS — Z8673 Personal history of transient ischemic attack (TIA), and cerebral infarction without residual deficits: Secondary | ICD-10-CM | POA: Diagnosis not present

## 2021-08-10 DIAGNOSIS — E890 Postprocedural hypothyroidism: Secondary | ICD-10-CM | POA: Diagnosis not present

## 2021-08-11 ENCOUNTER — Ambulatory Visit
Admission: RE | Admit: 2021-08-11 | Discharge: 2021-08-11 | Disposition: A | Discharge status: Home / Self Care | Payer: Medicare Other | Source: Ambulatory Visit | Attending: Family Medicine | Admitting: Family Medicine

## 2021-08-11 ENCOUNTER — Other Ambulatory Visit: Payer: Self-pay | Admitting: Family Medicine

## 2021-08-11 DIAGNOSIS — Z01818 Encounter for other preprocedural examination: Secondary | ICD-10-CM

## 2021-08-11 DIAGNOSIS — R0602 Shortness of breath: Secondary | ICD-10-CM | POA: Diagnosis not present

## 2021-08-17 DIAGNOSIS — C73 Malignant neoplasm of thyroid gland: Secondary | ICD-10-CM | POA: Diagnosis not present

## 2021-08-17 DIAGNOSIS — E1165 Type 2 diabetes mellitus with hyperglycemia: Secondary | ICD-10-CM | POA: Diagnosis not present

## 2021-08-17 DIAGNOSIS — E892 Postprocedural hypoparathyroidism: Secondary | ICD-10-CM | POA: Diagnosis not present

## 2021-08-17 DIAGNOSIS — N189 Chronic kidney disease, unspecified: Secondary | ICD-10-CM | POA: Diagnosis not present

## 2021-08-17 DIAGNOSIS — I10 Essential (primary) hypertension: Secondary | ICD-10-CM | POA: Diagnosis not present

## 2021-08-17 DIAGNOSIS — E890 Postprocedural hypothyroidism: Secondary | ICD-10-CM | POA: Diagnosis not present

## 2021-08-25 DIAGNOSIS — M5136 Other intervertebral disc degeneration, lumbar region: Secondary | ICD-10-CM | POA: Diagnosis not present

## 2021-08-25 DIAGNOSIS — M48062 Spinal stenosis, lumbar region with neurogenic claudication: Secondary | ICD-10-CM | POA: Diagnosis not present

## 2021-08-27 DIAGNOSIS — N184 Chronic kidney disease, stage 4 (severe): Secondary | ICD-10-CM | POA: Diagnosis not present

## 2021-09-03 DIAGNOSIS — G8929 Other chronic pain: Secondary | ICD-10-CM | POA: Diagnosis not present

## 2021-09-03 DIAGNOSIS — M810 Age-related osteoporosis without current pathological fracture: Secondary | ICD-10-CM | POA: Diagnosis not present

## 2021-09-03 DIAGNOSIS — E7800 Pure hypercholesterolemia, unspecified: Secondary | ICD-10-CM | POA: Diagnosis not present

## 2021-09-03 DIAGNOSIS — I10 Essential (primary) hypertension: Secondary | ICD-10-CM | POA: Diagnosis not present

## 2021-09-03 DIAGNOSIS — E1169 Type 2 diabetes mellitus with other specified complication: Secondary | ICD-10-CM | POA: Diagnosis not present

## 2021-09-04 ENCOUNTER — Ambulatory Visit: Payer: Medicare Other | Admitting: Podiatry

## 2021-09-09 DIAGNOSIS — M545 Low back pain, unspecified: Secondary | ICD-10-CM | POA: Diagnosis not present

## 2021-09-11 ENCOUNTER — Other Ambulatory Visit: Payer: Self-pay | Admitting: *Deleted

## 2021-09-11 DIAGNOSIS — M7989 Other specified soft tissue disorders: Secondary | ICD-10-CM

## 2021-09-15 ENCOUNTER — Ambulatory Visit (INDEPENDENT_AMBULATORY_CARE_PROVIDER_SITE_OTHER): Payer: Medicare Other | Admitting: Podiatry

## 2021-09-15 DIAGNOSIS — Z91199 Patient's noncompliance with other medical treatment and regimen due to unspecified reason: Secondary | ICD-10-CM

## 2021-09-15 NOTE — Progress Notes (Signed)
1. No-show for appointment     

## 2021-09-22 ENCOUNTER — Ambulatory Visit (HOSPITAL_COMMUNITY): Payer: Medicare Other

## 2021-09-22 ENCOUNTER — Encounter: Payer: Medicare Other | Admitting: Vascular Surgery

## 2021-09-24 ENCOUNTER — Telehealth: Payer: Self-pay | Admitting: Podiatry

## 2021-09-24 NOTE — Telephone Encounter (Signed)
Pt calling for status of her diabetic shoes. It has been over 6 wks. Pt has appt 10.6 to see Dr Valentina Lucks if they are in she can dispense. Please let pt know status

## 2021-09-25 DIAGNOSIS — N184 Chronic kidney disease, stage 4 (severe): Secondary | ICD-10-CM | POA: Diagnosis not present

## 2021-09-25 DIAGNOSIS — E113293 Type 2 diabetes mellitus with mild nonproliferative diabetic retinopathy without macular edema, bilateral: Secondary | ICD-10-CM | POA: Diagnosis not present

## 2021-09-25 DIAGNOSIS — Z23 Encounter for immunization: Secondary | ICD-10-CM | POA: Diagnosis not present

## 2021-09-25 DIAGNOSIS — I69328 Other speech and language deficits following cerebral infarction: Secondary | ICD-10-CM | POA: Diagnosis not present

## 2021-10-06 DIAGNOSIS — M545 Low back pain, unspecified: Secondary | ICD-10-CM | POA: Diagnosis not present

## 2021-10-09 ENCOUNTER — Ambulatory Visit (INDEPENDENT_AMBULATORY_CARE_PROVIDER_SITE_OTHER): Payer: Medicare Other | Admitting: Podiatrist

## 2021-10-09 DIAGNOSIS — E1142 Type 2 diabetes mellitus with diabetic polyneuropathy: Secondary | ICD-10-CM | POA: Diagnosis not present

## 2021-10-09 DIAGNOSIS — M79675 Pain in left toe(s): Secondary | ICD-10-CM | POA: Diagnosis not present

## 2021-10-09 DIAGNOSIS — M79674 Pain in right toe(s): Secondary | ICD-10-CM

## 2021-10-09 DIAGNOSIS — B351 Tinea unguium: Secondary | ICD-10-CM | POA: Diagnosis not present

## 2021-10-15 ENCOUNTER — Encounter: Payer: Self-pay | Admitting: Podiatrist

## 2021-10-15 NOTE — Progress Notes (Signed)
Subjective: [name] is a 81 y.o. female patient who is diabetic and presents to office today with concern of long,mildly painful nails  while ambulating in shoes; unable to trim.  She is on Lyrica and relates subjective neuropathic symptoms.  No new pedal complaints reported.  Swelling has improved from her last visit.    Patient Active Problem List   Diagnosis Date Noted   Hypocalcemia 10/24/2020   Malignant tumor of thyroid gland ([place]) 10/24/2020   Age-related osteoporosis without current pathological fracture 06/13/2020   CVA, old, speech/language deficit 06/13/2020   Generalized abdominal pain 06/13/2020   Long term (current) use of insulin ([place]) 06/13/2020   Osteoporosis without current pathological fracture 06/13/2020   Pure hypercholesterolemia 06/13/2020   Pain in joint of right shoulder 06/26/2019   Iron deficiency anemia, unspecified 03/14/2018   Chronic pain syndrome 11/04/2017   Thoracic radiculopathy 11/04/2017   Pes anserinus bursitis of right knee 08/12/2017   GERD without esophagitis 05/12/2017   Pleuritic chest pain 05/04/2017   Medication monitoring encounter 05/04/2017   Dyslipidemia associated with type 2 diabetes mellitus ([place]) 04/20/2017   Hypertension associated with diabetes ([place]) 04/20/2017   Cerebrovascular accident (CVA) with involvement of right side of body ([place]) 04/20/2017   Peripheral autonomic neuropathy due to diabetes mellitus ([place]) 04/20/2017   Hypertensive renal disease with renal failure, stage 1-4 or unspecified chronic kidney disease 04/20/2017   Constipation 04/06/2017   Status post lumbar spinal fusion 04/03/2017   Recurrent UTI 04/03/2017   Chronic renal insufficiency 04/03/2017   Normocytic anemia 04/03/2017   Osteomyelitis of thoracic spine ([place]) 04/02/2017   Thoracic discitis 04/02/2017   DDD (degenerative disc disease), lumbar 12/31/2016   Narcotic dependence ([place]) 10/24/2015   Periprosthetic fracture around internal prosthetic  right knee joint 04/25/2015   Fracture, femur, distal, right, closed, initial encounter 04/24/2015   Closed fracture of distal end of right femur, initial encounter ([place]) 04/24/2015   Acute gout 10/17/2013   Hypoparathyroidism ([place]) 03/02/2012   Benign essential tremor 09/28/2010   Combined hyperlipidemia associated with type 2 diabetes mellitus ([place]) 09/26/2009   Osteopenia 09/26/2009   Postoperative hypothyroidism 09/26/2009   Multiple pulmonary nodules 08/22/2009   OSTEOARTHRITIS, KNEES, BILATERAL 08/07/2009   Allergic rhinitis 11/19/2008   CVA, old, hemiparesis ([place]) 10/16/2008   History of thyroid cancer 09/26/2008   Spinal stenosis of lumbar region 03/26/2008   Hypothyroidism 07/03/2007   Insulin dependent type 2 diabetes mellitus, controlled ([place]) 07/03/2007   Spinal epidural abscess 07/03/2007   Benign essential HTN 07/03/2007   PSOAS MUSCLE ABSCESS 06/09/2007     Allergies  Allergen Reactions   Pioglitazone Swelling    Other reaction(s): swelling Other reaction(s): Unknown   Actos [Pioglitazone Hydrochloride] Swelling   Amlodipine Besylate     Other reaction(s): Unknown Other reaction(s): Unknown   Cephalexin Other (See Comments)    Doesn't remember  Other reaction(s): unknown   Lorazepam     swelling Other reaction(s): swelling   Oxycodone     This medication makes her feel sick   Oxycodone-Acetaminophen     Other reaction(s): vomiting   Penicillin G Benzathine     Other reaction(s): Unknown Other reaction(s): Unknown   Rosiglitazone     unknown Other reaction(s): unknown Other reaction(s): Unknown   Tramadol Other (See Comments)    hallucinations   Tramadol Hcl     Other reaction(s): hallucinations     Vascular Examination: CFT <3 seconds b/l LE. Palpable pedal pulses b/l LE. Pedal hair absent. No  pain with calf compression b/l. Lower extremity skin temperature gradient within normal limits. mild edema noted BLE- improved from her last visit. No  cyanosis or clubbing noted b/l LE.   Dermatological Examination: Pedal integument with normal turgor, texture and tone BLE. No open wounds b/l LE. No interdigital macerations noted b/l LE. Toenails 2-5 left and 2-5 right elongated, discolored, dystrophic, thickened, and crumbly with subungual debris and tenderness to dorsal palpation. Anonychia noted bilateral great toes. Nailbed(s) epithelialized.    Musculoskeletal Examination: Muscle strength 5/5 to all lower extremity muscle groups bilaterally. No pain, crepitus or joint limitation noted with ROM bilateral LE. HAV with bunion deformity noted b/l LE. Pes planus deformity noted bilateral LE.   Neurological Examination: Protective sensation diminished with 10g monofilament b/l. Vibratory sensation decreased b/l.       Assessment and Plan:   ICD-10-CM   1. Pain due to onychomycosis of toenails of both feet  B35.1    M79.675    M79.674     2. Diabetic peripheral neuropathy associated with type 2 diabetes mellitus (HCC)  E11.42        -Examined patient. -Mechanically debrided all nails 2-5 bilateral using sterile nail nipper and filed with sterile dremel without incident  -Answered all patient questions -Patient to return  in 3 months for continued foot care.  -Patient advised to call the office if any problems or questions arise in the meantime.  Bronson Ing, DPM

## 2021-10-20 ENCOUNTER — Other Ambulatory Visit: Payer: Self-pay | Admitting: Nephrology

## 2021-10-20 DIAGNOSIS — N2581 Secondary hyperparathyroidism of renal origin: Secondary | ICD-10-CM

## 2021-10-20 DIAGNOSIS — N1832 Chronic kidney disease, stage 3b: Secondary | ICD-10-CM

## 2021-10-20 DIAGNOSIS — I129 Hypertensive chronic kidney disease with stage 1 through stage 4 chronic kidney disease, or unspecified chronic kidney disease: Secondary | ICD-10-CM

## 2021-10-20 DIAGNOSIS — D631 Anemia in chronic kidney disease: Secondary | ICD-10-CM

## 2021-10-20 DIAGNOSIS — N189 Chronic kidney disease, unspecified: Secondary | ICD-10-CM | POA: Diagnosis not present

## 2021-10-20 NOTE — Progress Notes (Deleted)
[facility]           [facility] [place]  History and Physical   [name] is a 81 y.o. female who presents with ***  Pt is followed by podiatry. At her visit with them in May 2023, she was having some BLE swelling with feelings of heaviness.  She has hx of DJD of lumbar spine with hx of fusion.  She also has hx of DM neuropathy.   ***  The pt does *** have hx of previous venous procedures. The patient has *** history of DVT. Pt does *** history of varicose vein.   Pt does *** history of skin changes in lower legs.   There is *** family history of venous disorders.   The patient has *** used compression stockings in the past.    The pt is on a statin for cholesterol management.  The pt is not on a daily aspirin.   Other AC:  Aggrenox The pt is on CCB, diuretic, ACEI for hypertension.   The pt is diabetic.   Tobacco hx:  former  Pt does *** have family hx of AAA.  Past Medical History:  Diagnosis Date   Anemia    Asthma    Chest pain, atypical    Diabetes mellitus    GERD (gastroesophageal reflux disease)    History of transient ischemic attack (TIA)    Hyperlipidemia    Hypertension    OA (osteoarthritis)    Thyroid carcinoma ([place])     Past Surgical History:  Procedure Laterality Date   BACK SURGERY     BIOPSY  03/14/2018   Procedure: BIOPSY;  Surgeon: Wilford Corner, MD;  Location: [facility];  Service: Endoscopy;;  EGD and Colon   BREAST BIOPSY     CARDIAC CATHETERIZATION  03/18/2009   NORMAL LEFT VENTRICULAR SIZE AND CONTRACTILITY WITH NORMAL SYSTOLIC  FUNCTION. EF 60%   CARDIOLITE STUDY     SHOWED A SIGNIFICANT REVERSIBLE ANTERIOR WALL DEFECT CONSISTENT WITH ISCHEMIA. EF 55%   COLONOSCOPY WITH PROPOFOL N/A 03/14/2018   Procedure: COLONOSCOPY WITH PROPOFOL;  Surgeon: Wilford Corner, MD;  Location: [facility];  Service: Endoscopy;  Laterality: N/A;   ESOPHAGOGASTRODUODENOSCOPY (EGD) WITH PROPOFOL N/A 03/14/2018   Procedure:  ESOPHAGOGASTRODUODENOSCOPY (EGD) WITH PROPOFOL;  Surgeon: Wilford Corner, MD;  Location: [facility];  Service: Endoscopy;  Laterality: N/A;   FEMUR IM NAIL Right 04/25/2015   Procedure: INTRAMEDULLARY (IM) RIGHT  RETROGRADE FEMORAL NAILING;  Surgeon: Rod Can, MD;  Location: [facility];  Service: Orthopedics;  Laterality: Right;   IR FL GUIDED LOC OF NEEDLE/CATH TIP FOR SPINAL INJECTION RT  04/05/2017   IR FLUORO GUIDE CV LINE RIGHT  04/08/2017   IR US GUIDE VASC ACCESS RIGHT  04/08/2017   LUMBAR FUSION     POLYPECTOMY  03/14/2018   Procedure: POLYPECTOMY;  Surgeon: Wilford Corner, MD;  Location: [facility];  Service: Endoscopy;;   THYROIDECTOMY     TOTAL KNEE ARTHROPLASTY     right    Social History   Socioeconomic History   Marital status: Widowed    Spouse name: Not on file   Number of children: 5   Years of education: Not on file   Highest education level: Not on file  Occupational History    Employer: RETIRED  Tobacco Use   Smoking status: Former   Smokeless tobacco: Never  Vaping Use   Vaping Use: Never used  Substance and Sexual  Activity   Alcohol use: No   Drug use: No   Sexual activity: Not on file  Other Topics Concern   Not on file  Social History Narrative   Not on file   Social Determinants of Health   Financial Resource Strain: Not on file  Food Insecurity: Not on file  Transportation Needs: Not on file  Physical Activity: Not on file  Stress: Not on file  Social Connections: Not on file  Intimate Partner Violence: Not on file    *** Family History  Problem Relation Age of Onset   Pneumonia Mother    Heart attack Father    Breast cancer Neg Hx     Current Outpatient Medications  Medication Sig Dispense Refill   ACETAMINOPHEN EXTRA STRENGTH PO 2 tablets     Acetaminophen-Codeine 300-30 MG tablet acetaminophen 300 mg-codeine 30 mg tablet  Take 1 tablet as needed by oral route at bedtime for 20 days.     albuterol (VENTOLIN HFA) 108 (90  Base) MCG/ACT inhaler 1 puff as needed     amLODipine (NORVASC) 5 MG tablet Take 5 mg by mouth daily.     amLODipine (NORVASC) 5 MG tablet 1 tablet     atorvastatin (LIPITOR) 20 MG tablet Take 20 mg by mouth daily.   0   atorvastatin (LIPITOR) 20 MG tablet Take 1 tablet by mouth daily.     azithromycin (ZITHROMAX) 250 MG tablet Take 250 mg by mouth as directed.     B-D UF III MINI PEN NEEDLES 31G X 5 MM MISC Inject into the skin daily.     benzonatate (TESSALON) 100 MG capsule 1 capsule as needed     Blood Glucose Monitoring Suppl (ONETOUCH VERIO REFLECT) w/Device KIT 2 (two) times daily. as directed     Blood Pressure Monitor DEVI See admin instructions.     calcitRIOL (ROCALTROL) 0.25 MCG capsule Take 0.25 mcg by mouth 2 (two) times daily.      calcitRIOL (ROCALTROL) 0.25 MCG capsule 1 capsule     Calcium Carb-Cholecalciferol (CALCIUM-VITAMIN D) 500-200 MG-UNIT tablet Take by mouth.     calcium-vitamin D (OSCAL WITH D) 500-200 MG-UNIT per tablet Take 1 tablet by mouth daily with breakfast.     Cholecalciferol (VITAMIN D3) 1.25 MG (50000 UT) CAPS Take 1 capsule by mouth once a week.     clotrimazole-betamethasone (LOTRISONE) cream Apply topically 2 (two) times daily.     colchicine 0.6 MG tablet Take by mouth.     dipyridamole-aspirin (AGGRENOX) 200-25 MG 12hr capsule 1 capsule     dipyridamole-aspirin (AGGRENOX) 25-200 MG per 12 hr capsule Take 1 capsule by mouth 2 (two) times daily.      famotidine (PEPCID) 20 MG tablet Take 20 mg by mouth daily as needed for heartburn or indigestion.     ferrous sulfate 325 (65 FE) MG tablet Take 325 mg by mouth daily with breakfast.     fluticasone (FLONASE) 50 MCG/ACT nasal spray Place 1 spray into both nostrils daily.     FLUZONE HIGH-DOSE QUADRIVALENT 0.7 ML SUSY      furosemide (LASIX) 20 MG tablet Take 1 tablet by mouth every other day.     glucose blood (ONETOUCH VERIO) test strip See admin instructions.     guaiFENesin-codeine 100-10 MG/5ML syrup  Take by mouth.     HYDROcodone-acetaminophen (NORCO/VICODIN) 5-325 MG tablet Take 0.5-1 tablets by mouth 2 (two) times daily as needed for moderate pain.     hydrocortisone (ANUSOL-HC) 2.5 %  rectal cream 1 application to affected area     Insulin NPH, Human,, Isophane, (HUMULIN N KWIKPEN) 100 UNIT/ML Kiwkpen INJECT 10 UNITS UNDER THE SKIN EVERY MORNING AS DIRECTED     ipratropium (ATROVENT) 0.06 % nasal spray Place 2 sprays into both nostrils 4 (four) times daily.     levothyroxine (SYNTHROID) 137 MCG tablet 1 tablet on an empty stomach in the morning     levothyroxine (SYNTHROID, LEVOTHROID) 125 MCG tablet Take 125 mcg by mouth daily before breakfast.     lisinopril (PRINIVIL,ZESTRIL) 40 MG tablet Take 40 mg by mouth daily.      lisinopril (ZESTRIL) 20 MG tablet Take 20 mg by mouth daily.     loratadine-pseudoephedrine (CLARITIN-D 24 HOUR) 10-240 MG 24 hr tablet 1 tablet as needed     meclizine (ANTIVERT) 12.5 MG tablet Take 12.5 mg by mouth daily as needed for dizziness.     mometasone (ELOCON) 0.1 % ointment Apply topically.     montelukast (SINGULAIR) 10 MG tablet Take 10 mg by mouth daily as needed.  5   NON [facility]  Creams-#11     nystatin ointment (MYCOSTATIN) Apply 1 application topically 2 (two) times daily as needed for itching.  0   OneTouch Delica Lancets 11A MISC See admin instructions.     pantoprazole (PROTONIX) 40 MG tablet Take 40 mg by mouth daily.     pantoprazole (PROTONIX) 40 MG tablet 1 tablet     PFIZER COVID-19 VAC BIVALENT injection      polyethylene glycol (MIRALAX / GLYCOLAX) packet Take 17 g by mouth daily as needed for moderate constipation.      Polyethylene Glycol 3350 POWD 527 gram bottle 1 capfull of powder mixed with 8 ounces of liquid     predniSONE (DELTASONE) 10 MG tablet See admin instructions.     pregabalin (LYRICA) 100 MG capsule Take 1 capsule (100 mg total) by mouth every 8 (eight) hours. 90 capsule 0   pregabalin (LYRICA) 100  MG capsule 1 capsule     RESTASIS 0.05 % ophthalmic emulsion 1 drop 2 (two) times daily.     senna (SENOKOT) 8.6 MG TABS tablet Take 1 tablet by mouth 2 (two) times daily as needed for mild constipation.     trimethoprim (TRIMPEX) 100 MG tablet Take 100 mg by mouth daily.     valACYclovir (VALTREX) 1000 MG tablet 1 tablet     No current facility-administered medications for this visit.    Allergies  Allergen Reactions   Pioglitazone Swelling    Other reaction(s): swelling Other reaction(s): Unknown   Actos [Pioglitazone Hydrochloride] Swelling   Amlodipine Besylate     Other reaction(s): Unknown Other reaction(s): Unknown   Cephalexin Other (See Comments)    Doesn't remember  Other reaction(s): unknown   Lorazepam     swelling Other reaction(s): swelling   Oxycodone     This medication makes her feel sick   Oxycodone-Acetaminophen     Other reaction(s): vomiting   Penicillin G Benzathine     Other reaction(s): Unknown Other reaction(s): Unknown   Rosiglitazone     unknown Other reaction(s): unknown Other reaction(s): Unknown   Tramadol Other (See Comments)    hallucinations   Tramadol Hcl     Other reaction(s): hallucinations    REVIEW OF SYSTEMS:  *** _0  denotes positive finding, _1  denotes negative finding Cardiac  Comments:  Chest pain or chest pressure:    Shortness of breath upon exertion:  Short of breath when lying flat:    Irregular heart rhythm:        Vascular    Pain in calf, thigh, or hip brought on by ambulation:    Pain in feet at night that wakes you up from your sleep:     Blood clot in your veins:    Leg swelling:  x       Pulmonary    Oxygen at home:    Productive cough:     Wheezing:         Neurologic    Sudden weakness in arms or legs:     Sudden numbness in arms or legs:     Sudden onset of difficulty speaking or slurred speech:    Temporary loss of vision in one eye:     Problems with dizziness:         Gastrointestinal     Blood in stool:     Vomited blood:         Genitourinary    Burning when urinating:     Blood in urine:        Psychiatric    Major depression:         Hematologic    Bleeding problems:    Problems with blood clotting too easily:        Skin    Rashes or ulcers:        Constitutional    Fever or chills:      PHYSICAL EXAMINATION:  ***  General:  WDWN in NAD; vital signs documented above Gait: Not observed HENT: WNL, normocephalic Pulmonary: normal non-labored breathing without wheezing Cardiac: {Desc; regular/irreg:14544} HR; {With/Without:20273} carotid bruit*** Abdomen: soft, NT, aortic pulse is *** palpable Skin: {With/Without:20273} rashes Vascular Exam/Pulses:  Right Left  Radial {Exam; arterial pulse strength 0-4:30167} {Exam; arterial pulse strength 0-4:30167}  DP {Exam; arterial pulse strength 0-4:30167} {Exam; arterial pulse strength 0-4:30167}  PT {Exam; arterial pulse strength 0-4:30167} {Exam; arterial pulse strength 0-4:30167}   Extremities: ***  Neurologic: A&O X 3;  moving all extremities equally Psychiatric:  The pt has {Desc; normal/abnormal:11317::"Normal"} affect.   Non-Invasive Vascular Imaging:   Venous duplex on 10/22/2021: ***    [name] is a 81 y.o. female who presents with: ***    -pt has *** pedal pulses -pt does *** have evidence of DVT.  Pt does ***have venous reflux *** -discussed with pt about wearing *** high *** mmHg compression stockings and pt was measured for these today.   *** -discussed the importance of leg elevation and how to elevate properly - pt is advised to elevate their legs and a diagram is given to them to demonstrate for pt to lay flat on their back with knees elevated and slightly bent with their feet higher than their knees, which puts their feet higher than their heart for 15 minutes per day.  If pt cannot lay flat, advised to lay as flat as possible.  -pt is advised to continue as much walking as  possible and avoid sitting or standing for long periods of time.  -discussed importance of weight loss and exercise and that water aerobics would also be beneficial.  -handout with recommendations given -pt will f/u ***   Leontine Locket, [facility] [phone]  Clinic MD:  ***

## 2021-10-22 ENCOUNTER — Ambulatory Visit (HOSPITAL_COMMUNITY): Payer: Medicare Other

## 2021-10-27 ENCOUNTER — Ambulatory Visit
Admission: RE | Admit: 2021-10-27 | Discharge: 2021-10-27 | Disposition: A | Discharge status: Home / Self Care | Payer: Medicare Other | Source: Ambulatory Visit | Attending: Nephrology | Admitting: Nephrology

## 2021-10-27 DIAGNOSIS — N189 Chronic kidney disease, unspecified: Secondary | ICD-10-CM | POA: Diagnosis not present

## 2021-10-27 DIAGNOSIS — N281 Cyst of kidney, acquired: Secondary | ICD-10-CM | POA: Diagnosis not present

## 2021-10-27 DIAGNOSIS — N1832 Chronic kidney disease, stage 3b: Secondary | ICD-10-CM

## 2021-10-27 DIAGNOSIS — I129 Hypertensive chronic kidney disease with stage 1 through stage 4 chronic kidney disease, or unspecified chronic kidney disease: Secondary | ICD-10-CM

## 2021-10-27 DIAGNOSIS — D631 Anemia in chronic kidney disease: Secondary | ICD-10-CM

## 2021-10-27 DIAGNOSIS — N2581 Secondary hyperparathyroidism of renal origin: Secondary | ICD-10-CM

## 2021-10-31 ENCOUNTER — Encounter: Payer: Self-pay | Admitting: Podiatry

## 2021-11-02 DIAGNOSIS — D649 Anemia, unspecified: Secondary | ICD-10-CM | POA: Diagnosis not present

## 2021-11-06 ENCOUNTER — Telehealth: Payer: Self-pay | Admitting: Oncology

## 2021-11-06 NOTE — Telephone Encounter (Signed)
Attempted to contact patient to schedule, no answer so voicemail was left

## 2021-11-09 ENCOUNTER — Ambulatory Visit (HOSPITAL_COMMUNITY)
Admission: RE | Admit: 2021-11-09 | Discharge: 2021-11-09 | Disposition: A | Discharge status: Home / Self Care | Payer: Medicare Other | Source: Ambulatory Visit | Attending: Vascular Surgery | Admitting: Vascular Surgery

## 2021-11-09 ENCOUNTER — Encounter: Payer: Self-pay | Admitting: Physician Assistant

## 2021-11-09 ENCOUNTER — Ambulatory Visit (INDEPENDENT_AMBULATORY_CARE_PROVIDER_SITE_OTHER): Payer: Medicare Other | Admitting: Physician Assistant

## 2021-11-09 VITALS — BP 138/51 | HR 90 | Temp 97.9°F | Resp 20 | Ht 64.0 in | Wt 244.2 lb

## 2021-11-09 DIAGNOSIS — M7989 Other specified soft tissue disorders: Secondary | ICD-10-CM

## 2021-11-09 NOTE — Progress Notes (Signed)
Requested by:  Lujean Amel, [place],  [place] [address]  Reason for consultation: leg swelling    History of Present Illness   [name] is a 81 y.o. (10/07/1940) female who presents for evaluation of right leg swelling. She has had swelling for several years now. However she reports over the past couple months she had an area of redness, warmth and tenderness appear on her distal right leg. This has slowly resolved. There is now dark area and some tenderness still present. She otherwise says she does not have pain in her legs.She denies any aching, heaviness, throbbing, burning, itching, bleeding or ulceration.  She does elevate in lift chair and also has adjustable bed. She has worn compression many years ago but not recently. She explains that the swelling is better upon first waking and also with elevation. However once she gets up it comes right back. She lives independently but feels she gets around fairly well. She uses rolling walker. She says she has chronic back issues from spinal stenosis and often has sciatic nerve pains that limit her activity. She has no DVT history. No family history of venous disease. She says she does not have any heart problems but she is CKD stage 3.   Venous symptoms include: swelling Onset/duration:  couple months  Occupation:  retired Aggravating factors: sitting, standing, ambulating Alleviating factors: elevation Compression:  many years ago Helps: unsure Pain medications:  takes chronically for back pain Previous vein procedures:  No History of DVT:  No  Past Medical History:  Diagnosis Date   Anemia    Asthma    Chest pain, atypical    Diabetes mellitus    GERD (gastroesophageal reflux disease)    History of transient ischemic attack (TIA)    Hyperlipidemia    Hypertension    OA (osteoarthritis)    Thyroid carcinoma ([place])     Past Surgical History:  Procedure Laterality Date   BACK SURGERY      BIOPSY  03/14/2018   Procedure: BIOPSY;  Surgeon: Wilford Corner, MD;  Location: [facility];  Service: Endoscopy;;  EGD and Colon   BREAST BIOPSY     CARDIAC CATHETERIZATION  03/18/2009   NORMAL LEFT VENTRICULAR SIZE AND CONTRACTILITY WITH NORMAL SYSTOLIC  FUNCTION. EF 60%   CARDIOLITE STUDY     SHOWED A SIGNIFICANT REVERSIBLE ANTERIOR WALL DEFECT CONSISTENT WITH ISCHEMIA. EF 55%   COLONOSCOPY WITH PROPOFOL N/A 03/14/2018   Procedure: COLONOSCOPY WITH PROPOFOL;  Surgeon: Wilford Corner, MD;  Location: [facility];  Service: Endoscopy;  Laterality: N/A;   ESOPHAGOGASTRODUODENOSCOPY (EGD) WITH PROPOFOL N/A 03/14/2018   Procedure: ESOPHAGOGASTRODUODENOSCOPY (EGD) WITH PROPOFOL;  Surgeon: Wilford Corner, MD;  Location: [facility];  Service: Endoscopy;  Laterality: N/A;   FEMUR IM NAIL Right 04/25/2015   Procedure: INTRAMEDULLARY (IM) RIGHT  RETROGRADE FEMORAL NAILING;  Surgeon: Rod Can, MD;  Location: [facility];  Service: Orthopedics;  Laterality: Right;   IR FL GUIDED LOC OF NEEDLE/CATH TIP FOR SPINAL INJECTION RT  04/05/2017   IR FLUORO GUIDE CV LINE RIGHT  04/08/2017   IR US GUIDE VASC ACCESS RIGHT  04/08/2017   LUMBAR FUSION     POLYPECTOMY  03/14/2018   Procedure: POLYPECTOMY;  Surgeon: Wilford Corner, MD;  Location: [facility];  Service: Endoscopy;;   THYROIDECTOMY     TOTAL KNEE ARTHROPLASTY     right    Social History   Socioeconomic History   Marital status: Widowed  Spouse name: Not on file   Number of children: 5   Years of education: Not on file   Highest education level: Not on file  Occupational History    Employer: RETIRED  Tobacco Use   Smoking status: Former    Passive exposure: Never   Smokeless tobacco: Never  Vaping Use   Vaping Use: Never used  Substance and Sexual Activity   Alcohol use: No   Drug use: No   Sexual activity: Not on file  Other Topics Concern   Not on file  Social History Narrative   Not on file   Social Determinants of  Health   Financial Resource Strain: Not on file  Food Insecurity: Not on file  Transportation Needs: Not on file  Physical Activity: Not on file  Stress: Not on file  Social Connections: Not on file  Intimate Partner Violence: Not on file    Family History  Problem Relation Age of Onset   Pneumonia Mother    Heart attack Father    Breast cancer Neg Hx     Current Outpatient Medications  Medication Sig Dispense Refill   ACETAMINOPHEN EXTRA STRENGTH PO 2 tablets     Acetaminophen-Codeine 300-30 MG tablet acetaminophen 300 mg-codeine 30 mg tablet  Take 1 tablet as needed by oral route at bedtime for 20 days.     albuterol (VENTOLIN HFA) 108 (90 Base) MCG/ACT inhaler 1 puff as needed     amLODipine (NORVASC) 5 MG tablet Take 5 mg by mouth daily.     amLODipine (NORVASC) 5 MG tablet 1 tablet     atorvastatin (LIPITOR) 20 MG tablet Take 20 mg by mouth daily.   0   atorvastatin (LIPITOR) 20 MG tablet Take 1 tablet by mouth daily.     azithromycin (ZITHROMAX) 250 MG tablet Take 250 mg by mouth as directed.     B-D UF III MINI PEN NEEDLES 31G X 5 MM MISC Inject into the skin daily.     benzonatate (TESSALON) 100 MG capsule 1 capsule as needed     Blood Glucose Monitoring Suppl (ONETOUCH VERIO REFLECT) w/Device KIT 2 (two) times daily. as directed     Blood Pressure Monitor DEVI See admin instructions.     calcitRIOL (ROCALTROL) 0.25 MCG capsule Take 0.25 mcg by mouth 2 (two) times daily.      calcitRIOL (ROCALTROL) 0.25 MCG capsule 1 capsule     Calcium Carb-Cholecalciferol (CALCIUM-VITAMIN D) 500-200 MG-UNIT tablet Take by mouth.     calcium-vitamin D (OSCAL WITH D) 500-200 MG-UNIT per tablet Take 1 tablet by mouth daily with breakfast.     Cholecalciferol (VITAMIN D3) 1.25 MG (50000 UT) CAPS Take 1 capsule by mouth once a week.     clotrimazole-betamethasone (LOTRISONE) cream Apply topically 2 (two) times daily.     colchicine 0.6 MG tablet Take by mouth.     dipyridamole-aspirin  (AGGRENOX) 200-25 MG 12hr capsule 1 capsule     dipyridamole-aspirin (AGGRENOX) 25-200 MG per 12 hr capsule Take 1 capsule by mouth 2 (two) times daily.      famotidine (PEPCID) 20 MG tablet Take 20 mg by mouth daily as needed for heartburn or indigestion.     ferrous sulfate 325 (65 FE) MG tablet Take 325 mg by mouth daily with breakfast.     fluticasone (FLONASE) 50 MCG/ACT nasal spray Place 1 spray into both nostrils daily.     FLUZONE HIGH-DOSE QUADRIVALENT 0.7 ML SUSY      furosemide (LASIX)  20 MG tablet Take 1 tablet by mouth every other day.     glucose blood (ONETOUCH VERIO) test strip See admin instructions.     guaiFENesin-codeine 100-10 MG/5ML syrup Take by mouth.     HYDROcodone-acetaminophen (NORCO/VICODIN) 5-325 MG tablet Take 0.5-1 tablets by mouth 2 (two) times daily as needed for moderate pain.     hydrocortisone (ANUSOL-HC) 2.5 % rectal cream 1 application to affected area     Insulin NPH, Human,, Isophane, (HUMULIN N KWIKPEN) 100 UNIT/ML Kiwkpen INJECT 10 UNITS UNDER THE SKIN EVERY MORNING AS DIRECTED     ipratropium (ATROVENT) 0.06 % nasal spray Place 2 sprays into both nostrils 4 (four) times daily.     levothyroxine (SYNTHROID) 137 MCG tablet 1 tablet on an empty stomach in the morning     levothyroxine (SYNTHROID, LEVOTHROID) 125 MCG tablet Take 125 mcg by mouth daily before breakfast.     lisinopril (PRINIVIL,ZESTRIL) 40 MG tablet Take 40 mg by mouth daily.      lisinopril (ZESTRIL) 20 MG tablet Take 20 mg by mouth daily.     loratadine-pseudoephedrine (CLARITIN-D 24 HOUR) 10-240 MG 24 hr tablet 1 tablet as needed     meclizine (ANTIVERT) 12.5 MG tablet Take 12.5 mg by mouth daily as needed for dizziness.     mometasone (ELOCON) 0.1 % ointment Apply topically.     montelukast (SINGULAIR) 10 MG tablet Take 10 mg by mouth daily as needed.  5   NON [facility]  Creams-#11     nystatin ointment (MYCOSTATIN) Apply 1 application topically 2 (two) times  daily as needed for itching.  0   OneTouch Delica Lancets 26Z MISC See admin instructions.     pantoprazole (PROTONIX) 40 MG tablet Take 40 mg by mouth daily.     pantoprazole (PROTONIX) 40 MG tablet 1 tablet     PFIZER COVID-19 VAC BIVALENT injection      polyethylene glycol (MIRALAX / GLYCOLAX) packet Take 17 g by mouth daily as needed for moderate constipation.      Polyethylene Glycol 3350 POWD 527 gram bottle 1 capfull of powder mixed with 8 ounces of liquid     predniSONE (DELTASONE) 10 MG tablet See admin instructions.     pregabalin (LYRICA) 100 MG capsule Take 1 capsule (100 mg total) by mouth every 8 (eight) hours. 90 capsule 0   pregabalin (LYRICA) 100 MG capsule 1 capsule     RESTASIS 0.05 % ophthalmic emulsion 1 drop 2 (two) times daily.     senna (SENOKOT) 8.6 MG TABS tablet Take 1 tablet by mouth 2 (two) times daily as needed for mild constipation.     trimethoprim (TRIMPEX) 100 MG tablet Take 100 mg by mouth daily.     valACYclovir (VALTREX) 1000 MG tablet 1 tablet     No current facility-administered medications for this visit.    Allergies  Allergen Reactions   Pioglitazone Swelling    Other reaction(s): swelling Other reaction(s): Unknown   Actos [Pioglitazone Hydrochloride] Swelling   Amlodipine Besylate     Other reaction(s): Unknown Other reaction(s): Unknown   Cephalexin Other (See Comments)    Doesn't remember  Other reaction(s): unknown   Lorazepam     swelling Other reaction(s): swelling   Oxycodone     This medication makes her feel sick   Oxycodone-Acetaminophen     Other reaction(s): vomiting   Penicillin G Benzathine     Other reaction(s): Unknown Other reaction(s): Unknown   Rosiglitazone  unknown Other reaction(s): unknown Other reaction(s): Unknown   Tramadol Other (See Comments)    hallucinations   Tramadol Hcl     Other reaction(s): hallucinations    REVIEW OF SYSTEMS (negative unless checked):   Cardiac:  _0  Chest pain or  chest pressure? _1  Shortness of breath upon activity? _2  Shortness of breath when lying flat? _3  Irregular heart rhythm?  Vascular:  _4  Pain in calf, thigh, or hip brought on by walking? _5  Pain in feet at night that wakes you up from your sleep? _6  Blood clot in your veins? _7  Leg swelling?  Pulmonary:  _8  Oxygen at home? _9  Productive cough? _10  Wheezing?  Neurologic:  _11  Sudden weakness in arms or legs? _12  Sudden numbness in arms or legs? _13  Sudden onset of difficult speaking or slurred speech? _14  Temporary loss of vision in one eye? _15  Problems with dizziness?  Gastrointestinal:  _16  Blood in stool? _17  Vomited blood?  Genitourinary:  _18  Burning when urinating? _19  Blood in urine?  Psychiatric:  _20  Major depression  Hematologic:  _21  Bleeding problems? _22  Problems with blood clotting?  Dermatologic:  _23  Rashes or ulcers?  Constitutional:  _24  Fever or chills?  Ear/Nose/Throat:  _25  Change in hearing? _26  Nose bleeds? _27  Sore throat?  Musculoskeletal:  _28  Back pain? _29  Joint pain? _30  Muscle pain?   Physical Examination     Vitals:   11/09/21 1249  BP: (!) 138/51  Pulse: 90  Resp: 20  Temp: 97.9 F (36.6 C)  TempSrc: Temporal  SpO2: 100%  Weight: 244 lb 3.2 oz (110.8 kg)  Height: _31  (1.626 m)   Body mass index is 41.92 kg/m.  General:  WDWN in NAD; vital signs documented above Gait: Not observed, uses rolling walker HENT: WNL, normocephalic Pulmonary: normal non-labored breathing  Cardiac: regular HR Vascular Exam/Pulses:  Right Left  Radial 2+ (normal) 2+ (normal)  Femoral Unable to palpate due to patient positioning and body habitus Unable to palpate due to patient positioning and body habitus  Popliteal 2+ (normal) 2+ (normal)  DP 2+ (normal) 2+ (normal)  PT 1+ (weak) 1+ (weak)   Extremities: without varicose veins, without reticular veins, with edema of bilateral lower extremities right > L, with stasis pigmentation changes of  distal anterior-lateral leg, without lipodermatosclerosis, without ulcers Musculoskeletal: no muscle wasting or atrophy  Neurologic: A&O X 3;  No focal weakness or paresthesias are detected Psychiatric:  The pt has Normal affect.  Non-invasive Vascular Imaging   BLE Venous Insufficiency Duplex (11/09/21):  RLE:  No DVT and SVT No GSV reflux  GSV diameter 0.25 cm No SSV reflux No deep venous reflux  Medical Decision Making   KANDRA GRAVEN is a 81 y.o. female who presents with swelling BLE. On exam she does have a lot of lower extremity edema. She likely had episode of phlebitis that is now resolving with some residual hyperpigmentation present on distal leg. Her duplex shows no DVT or SVT. She has no deep or superficial venous insufficiency in  her RLE. No SSV reflux. I think her swelling is probably multifactorial. She takes multiple medications that may be contributory as well as CKD stage 3. Her duplex today does not indicate any significant venous disease that would explain her swelling.  Based on the patient's history and examination, I recommend: knee high compression stockings 15-20 mmHg, elevation daily 20-30 minutes, exercise, refraining from prolonged sitting or standing. She can follow up as needed if she has new or worsening symptoms  Karoline Caldwell, PA-C [facility] Office: [phone]  11/09/2021, 12:57 PM  Clinic MD: Trula Slade

## 2021-11-12 NOTE — Telephone Encounter (Signed)
New order submitted to Dr Kipp Brood)

## 2021-11-13 ENCOUNTER — Other Ambulatory Visit: Payer: Self-pay | Admitting: Nurse Practitioner

## 2021-11-13 ENCOUNTER — Other Ambulatory Visit: Payer: Self-pay | Admitting: *Deleted

## 2021-11-13 DIAGNOSIS — D72829 Elevated white blood cell count, unspecified: Secondary | ICD-10-CM

## 2021-11-13 DIAGNOSIS — D649 Anemia, unspecified: Secondary | ICD-10-CM

## 2021-11-13 NOTE — Progress Notes (Unsigned)
New Hematology/Oncology Consult   Requesting MD: Dr. Lujean Amel  [phone]      Reason for Consult: Elevated white blood cell count, anemia  HPI: Ms. [name] is an 81 year old woman referred for evaluation of leukocytosis and anemia.  Labs done 11/02/2021 returned with hemoglobin 9.3, MCV 90, white count 10.3, platelet count 283,000.  Comparison CBC 09/25/2021-hemoglobin 9.6, MCV 90, white count 13.4, platelet count 378,000; creatinine 1.43, calcium 10.7.   Past Medical History:  Diagnosis Date   Anemia    Asthma    Chest pain, atypical    Diabetes mellitus    GERD (gastroesophageal reflux disease)    History of transient ischemic attack (TIA)    Hyperlipidemia    Hypertension    OA (osteoarthritis)    Thyroid carcinoma ([place])      Past Surgical History:  Procedure Laterality Date   BACK SURGERY     BIOPSY  03/14/2018   Procedure: BIOPSY;  Surgeon: Wilford Corner, MD;  Location: [facility];  Service: Endoscopy;;  EGD and Colon   BREAST BIOPSY     CARDIAC CATHETERIZATION  03/18/2009   NORMAL LEFT VENTRICULAR SIZE AND CONTRACTILITY WITH NORMAL SYSTOLIC  FUNCTION. EF 60%   CARDIOLITE STUDY     SHOWED A SIGNIFICANT REVERSIBLE ANTERIOR WALL DEFECT CONSISTENT WITH ISCHEMIA. EF 55%   COLONOSCOPY WITH PROPOFOL N/A 03/14/2018   Procedure: COLONOSCOPY WITH PROPOFOL;  Surgeon: Wilford Corner, MD;  Location: [facility];  Service: Endoscopy;  Laterality: N/A;   ESOPHAGOGASTRODUODENOSCOPY (EGD) WITH PROPOFOL N/A 03/14/2018   Procedure: ESOPHAGOGASTRODUODENOSCOPY (EGD) WITH PROPOFOL;  Surgeon: Wilford Corner, MD;  Location: [facility];  Service: Endoscopy;  Laterality: N/A;   FEMUR IM NAIL Right 04/25/2015   Procedure: INTRAMEDULLARY (IM) RIGHT  RETROGRADE FEMORAL NAILING;  Surgeon: Rod Can, MD;  Location: [facility];  Service: Orthopedics;  Laterality: Right;   IR FL GUIDED LOC OF NEEDLE/CATH TIP FOR SPINAL INJECTION RT  04/05/2017   IR FLUORO GUIDE CV LINE RIGHT   04/08/2017   IR US GUIDE VASC ACCESS RIGHT  04/08/2017   LUMBAR FUSION     POLYPECTOMY  03/14/2018   Procedure: POLYPECTOMY;  Surgeon: Wilford Corner, MD;  Location: [facility];  Service: Endoscopy;;   THYROIDECTOMY     TOTAL KNEE ARTHROPLASTY     right     Current Outpatient Medications:    ACETAMINOPHEN EXTRA STRENGTH PO, 2 tablets, Disp: , Rfl:    albuterol (VENTOLIN HFA) 108 (90 Base) MCG/ACT inhaler, 1 puff as needed, Disp: , Rfl:    amLODipine (NORVASC) 5 MG tablet, Take 5 mg by mouth daily., Disp: , Rfl:    atorvastatin (LIPITOR) 20 MG tablet, Take 20 mg by mouth daily. , Disp: , Rfl: 0   azithromycin (ZITHROMAX) 250 MG tablet, Take 250 mg by mouth as directed., Disp: , Rfl:    B-D UF III MINI PEN NEEDLES 31G X 5 MM MISC, Inject into the skin daily., Disp: , Rfl:    Blood Glucose Monitoring Suppl (ONETOUCH VERIO REFLECT) w/Device KIT, 2 (two) times daily. as directed, Disp: , Rfl:    Blood Pressure Monitor DEVI, See admin instructions., Disp: , Rfl:    calcitRIOL (ROCALTROL) 0.25 MCG capsule, 1 capsule, Disp: , Rfl:    calcium-vitamin D (OSCAL WITH D) 500-200 MG-UNIT per tablet, Take 1 tablet by mouth daily with breakfast., Disp: , Rfl:    Cholecalciferol (VITAMIN D3) 1.25 MG (50000 UT) CAPS, Take 1 capsule by mouth once a week., Disp: , Rfl:  clotrimazole-betamethasone (LOTRISONE) cream, Apply topically 2 (two) times daily., Disp: , Rfl:    colchicine 0.6 MG tablet, Take by mouth., Disp: , Rfl:    dipyridamole-aspirin (AGGRENOX) 200-25 MG 12hr capsule, 1 capsule, Disp: , Rfl:    famotidine (PEPCID) 20 MG tablet, Take 20 mg by mouth daily as needed for heartburn or indigestion., Disp: , Rfl:    ferrous sulfate 325 (65 FE) MG tablet, Take 325 mg by mouth daily with breakfast., Disp: , Rfl:    furosemide (LASIX) 20 MG tablet, Take 1 tablet by mouth every other day., Disp: , Rfl:    glucose blood (ONETOUCH VERIO) test strip, See admin instructions., Disp: , Rfl:     HYDROcodone-acetaminophen (NORCO/VICODIN) 5-325 MG tablet, Take 0.5-1 tablets by mouth 2 (two) times daily as needed for moderate pain., Disp: , Rfl:    hydrocortisone (ANUSOL-HC) 2.5 % rectal cream, 1 application to affected area, Disp: , Rfl:    Insulin NPH, Human,, Isophane, (HUMULIN N KWIKPEN) 100 UNIT/ML Kiwkpen, INJECT 10 UNITS UNDER THE SKIN EVERY MORNING AS DIRECTED, Disp: , Rfl:    levothyroxine (SYNTHROID) 137 MCG tablet, 1 tablet on an empty stomach in the morning, Disp: , Rfl:    lisinopril (PRINIVIL,ZESTRIL) 40 MG tablet, Take 40 mg by mouth daily. , Disp: , Rfl:    lisinopril (ZESTRIL) 20 MG tablet, Take 20 mg by mouth daily., Disp: , Rfl:    meclizine (ANTIVERT) 12.5 MG tablet, Take 12.5 mg by mouth daily as needed for dizziness., Disp: , Rfl:    mometasone (ELOCON) 0.1 % ointment, Apply topically., Disp: , Rfl:    montelukast (SINGULAIR) 10 MG tablet, Take 10 mg by mouth daily as needed., Disp: , Rfl: [place]  Creams-#11, Disp: , Rfl:    nystatin ointment (MYCOSTATIN), Apply 1 application topically 2 (two) times daily as needed for itching., Disp: , Rfl: 0   OneTouch Delica Lancets 81O MISC, See admin instructions., Disp: , Rfl:    pantoprazole (PROTONIX) 40 MG tablet, Take 40 mg by mouth daily., Disp: , Rfl:    Polyethylene Glycol 3350 POWD, 527 gram bottle 1 capfull of powder mixed with 8 ounces of liquid, Disp: , Rfl:    predniSONE (DELTASONE) 10 MG tablet, See admin instructions., Disp: , Rfl:    pregabalin (LYRICA) 100 MG capsule, Take 1 capsule (100 mg total) by mouth every 8 (eight) hours., Disp: 90 capsule, Rfl: 0   pregabalin (LYRICA) 100 MG capsule, 1 capsule, Disp: , Rfl:    RESTASIS 0.05 % ophthalmic emulsion, 1 drop 2 (two) times daily., Disp: , Rfl:    senna (SENOKOT) 8.6 MG TABS tablet, Take 1 tablet by mouth 2 (two) times daily as needed for mild constipation., Disp: , Rfl:    trimethoprim (TRIMPEX) 100 MG tablet, Take 100 mg by mouth  daily., Disp: , Rfl:    valACYclovir (VALTREX) 1000 MG tablet, 1 tablet, Disp: , Rfl:    Acetaminophen-Codeine 300-30 MG tablet, acetaminophen 300 mg-codeine 30 mg tablet  Take 1 tablet as needed by oral route at bedtime for 20 days. (Patient not taking: Reported on 11/16/2021), Disp: , Rfl:    FLUZONE HIGH-DOSE QUADRIVALENT 0.7 ML SUSY, , Disp: , Rfl:    PFIZER COVID-19 VAC BIVALENT injection, , Disp: , Rfl: :     Allergies  Allergen Reactions   Pioglitazone Swelling    Other reaction(s): swelling Other reaction(s): Unknown   Actos [Pioglitazone Hydrochloride] Swelling   Amlodipine Besylate  Other reaction(s): Unknown Other reaction(s): Unknown   Cephalexin Other (See Comments)    Doesn't remember  Other reaction(s): unknown   Lorazepam     swelling Other reaction(s): swelling   Oxycodone     This medication makes her feel sick   Oxycodone-Acetaminophen     Other reaction(s): vomiting   Penicillin G Benzathine     Other reaction(s): Unknown Other reaction(s): Unknown   Rosiglitazone     unknown Other reaction(s): unknown Other reaction(s): Unknown   Tramadol Other (See Comments)    hallucinations   Tramadol Hcl     Other reaction(s): hallucinations    FH: No family history of cancer or blood disorder.  SOCIAL HISTORY: She lives in [place] in independent living.  She has 5 children.  She is retired.  She previously worked in Software engineer.  She quit smoking "a long time ago".  No EtOH intake.  Review of Systems: She denies bleeding.  No fevers or sweats.  She has a good appetite.  No weight loss.  She has chronic low back pain related to lumbar stenosis, 2 previous back surgeries.  Main complaint is fatigue.  No unusual headache.  No vision change.  No shortness of breath.  No chest pain.  Chronic leg edema.  No hematuria or dysuria.  "A little bit of neuropathy" hands and feet.  Physical Exam:  Blood pressure (!) 154/64, pulse 73, temperature 98.2 F (36.8 C),  temperature source Oral, resp. rate 20, height _0  (1.626 m), weight 243 lb (110.2 kg), SpO2 100 %.  HEENT: No thrush or ulcers. Lungs: Lungs clear bilaterally. Cardiac: Regular rate and rhythm. Abdomen: No hepatosplenomegaly. Vascular: Pitting edema lower leg bilaterally. Lymph nodes: No palpable cervical, supraclavicular, axillary or inguinal lymph nodes. Neurologic: Alert and oriented. Skin: Hyperpigmented dry skin at the upper back.  LABS:   Recent Labs    11/16/21 1357  WBC 12.2*  HGB 9.8*  HCT 29.8*  PLT 344  Peripheral blood smear-few ovalocytes, rare teardrop, microcytes, no nucleated red blood cells, polychromasia not increased; majority of the white blood cells are mature appearing neutrophils and lymphocytes, few plasmacytoid lymphocytes, no blasts or other young forms, several 5 lobed polys; platelets appear normal to slightly increased.   Recent Labs    11/16/21 1357  NA 140  K 4.3  CL 102  CO2 28  GLUCOSE 142*  BUN 33*  CREATININE 1.52*  CALCIUM 10.4*      RADIOLOGY:  VAS [place] LOWER EXTREMITY VENOUS REFLUX  Result Date: 11/09/2021  Lower Venous Reflux Study Patient Name:  LATAVIA GOGA  Date of Exam:   11/09/2021 Medical Rec #: [id number]        Accession #:    [id number] Date of Birth: 03-14-1940        Patient Gender: F Patient Age:   27 years Exam Location:  [facility] Procedure:      VAS [place] LOWER EXTREMITY VENOUS REFLUX Referring Phys: Monica Martinez --------------------------------------------------------------------------------  Indications: Edema.  Comparison Study: No prior study Performing Technologist: Maudry Mayhew MHA, RDMS, RVT, RDCS  Examination Guidelines: A complete evaluation includes B-mode imaging, spectral Doppler, color Doppler, and power Doppler as needed of all accessible portions of each vessel. Bilateral testing is considered an integral part of a complete examination. Limited examinations for reoccurring  indications may be performed as noted. The reflux portion of the exam is performed with the patient in reverse Trendelenburg. Significant venous reflux is defined as >500 ms in the  superficial venous system, and >1 second in the deep venous system.  Venous Reflux Times +--------------+---------+------+-----------+------------+--------+ RIGHT         Reflux NoRefluxReflux TimeDiameter cmsComments                         Yes                                  +--------------+---------+------+-----------+------------+--------+ CFV           no                                             +--------------+---------+------+-----------+------------+--------+ FV mid        no                                             +--------------+---------+------+-----------+------------+--------+ Popliteal     no                                             +--------------+---------+------+-----------+------------+--------+ GSV at [facility]                                          NWV      +--------------+---------+------+-----------+------------+--------+ GSV prox thighno                            0.85             +--------------+---------+------+-----------+------------+--------+ GSV mid thigh no                            0.25             +--------------+---------+------+-----------+------------+--------+ GSV dist thighno                            0.25             +--------------+---------+------+-----------+------------+--------+ SSV prox calf no                            0.07             +--------------+---------+------+-----------+------------+--------+   Summary: Right: - No evidence of deep vein thrombosis seen in the right lower extremity, from the common femoral through the popliteal veins. Unable to visualize distal femoral vein. - No evidence of superficial venous thrombosis in the right lower extremity. -The deep venous system is competent. -The great  saphenous vein is competent. -The small saphenous vein is competent.  *See table(s) above for measurements and observations. Electronically signed by Harold Barban MD on 11/09/2021 at 2:42:51 PM.    Final    US RENAL  Result Date: 10/28/2021 CLINICAL DATA:  Chronic renal disease EXAM: RENAL / URINARY TRACT ULTRASOUND COMPLETE COMPARISON:  CT abdomen pelvis June 13, 2018 FINDINGS: Right Kidney: Renal measurements:  11.4 x 4.4 x 4.8 cm = volume: 125 mL. Increased cortical echogenicity. Normal cortical thickness. There is a 2.4 x 2.0 x 2.9 cm mildly complicated cyst within the superior pole of the right kidney. No hydronephrosis. Left Kidney: Renal measurements: 10.7 x 4.3 x 5.3 cm = volume: 127 mL. Increased cortical echogenicity. Normal cortical thickness. No hydronephrosis or mass. Bladder: Appears normal for degree of bladder distention. Other: None. IMPRESSION: 1. No hydronephrosis. 2. Increased cortical echogenicity compatible with chronic medical renal disease. 3. Mildly complicated cyst superior pole right kidney. Recommend attention on follow-up exams, potentially ultrasound in 6 months. Electronically Signed   By: Lovey Newcomer M.D.   On: 10/28/2021 10:27    Assessment and Plan:   Normocytic anemia Leukocytosis Hypertension Diabetes Chronic kidney disease History of thyroid cancer  Ms. [name] was referred for evaluation of anemia and leukocytosis both of which are fairly longstanding.  We discussed potential etiologies including a lymphoproliferative disorder, MDS, myeloproliferative disorder, anemia of renal failure, anemia of chronic disease.  We obtained additional laboratory evaluation and will contact her once those results are available.  We discussed the possibility of a bone marrow biopsy.  She will return for lab and follow-up in 3 months.  We will adjust to a sooner appointment if needed once the outstanding labs have resulted.  Patient seen with Dr. Benay Spice.  Ned Card,  NP 11/16/2021, 3:03 PM  This was a shared visit with Ned Card.  Ms. Pilling was interviewed and examined.  I reviewed the peripheral blood smear.  She is referred for evaluation of anemia.  She has chronic anemia and leukocytosis.  There is no obvious explanation for the hematologic findings after review of her history and blood smear today.  The differential diagnosis includes a myeloproliferative disorder, lymphoproliferative disorder, myelodysplasia, and anemia secondary to chronic renal failure.  The peripheral blood smear and white cell differential are not suggestive of CML.  We obtained additional laboratory evaluation today.  We will recommend a diagnostic bone marrow biopsy if she develops progressive anemia or a new hematologic finding.  I was present for greater than 50% of today's visit.  I performed medical decision making.  Julieanne Manson, MD

## 2021-11-16 ENCOUNTER — Other Ambulatory Visit (HOSPITAL_BASED_OUTPATIENT_CLINIC_OR_DEPARTMENT_OTHER): Payer: Self-pay

## 2021-11-16 ENCOUNTER — Inpatient Hospital Stay: Payer: Medicare Other

## 2021-11-16 ENCOUNTER — Encounter: Payer: Self-pay | Admitting: Nurse Practitioner

## 2021-11-16 ENCOUNTER — Inpatient Hospital Stay: Payer: Medicare Other | Attending: Nurse Practitioner | Admitting: Nurse Practitioner

## 2021-11-16 VITALS — BP 154/64 | HR 73 | Temp 98.2°F | Resp 20 | Ht 64.0 in | Wt 243.0 lb

## 2021-11-16 DIAGNOSIS — D72829 Elevated white blood cell count, unspecified: Secondary | ICD-10-CM

## 2021-11-16 DIAGNOSIS — D649 Anemia, unspecified: Secondary | ICD-10-CM

## 2021-11-16 LAB — FERRITIN: Ferritin: 63 ng/mL (ref 11–307)

## 2021-11-16 LAB — CBC WITH DIFFERENTIAL (CANCER CENTER ONLY)
Abs Immature Granulocytes: 0.05 10*3/uL (ref 0.00–0.07)
Basophils Absolute: 0 10*3/uL (ref 0.0–0.1)
Basophils Relative: 0 %
Eosinophils Absolute: 0.1 10*3/uL (ref 0.0–0.5)
Eosinophils Relative: 1 %
HCT: 29.8 % — ABNORMAL LOW (ref 36.0–46.0)
Hemoglobin: 9.8 g/dL — ABNORMAL LOW (ref 12.0–15.0)
Immature Granulocytes: 0 %
Lymphocytes Relative: 17 %
Lymphs Abs: 2.1 10*3/uL (ref 0.7–4.0)
MCH: 30.4 pg (ref 26.0–34.0)
MCHC: 32.9 g/dL (ref 30.0–36.0)
MCV: 92.5 fL (ref 80.0–100.0)
Monocytes Absolute: 0.7 10*3/uL (ref 0.1–1.0)
Monocytes Relative: 6 %
Neutro Abs: 9.2 10*3/uL — ABNORMAL HIGH (ref 1.7–7.7)
Neutrophils Relative %: 76 %
Platelet Count: 344 10*3/uL (ref 150–400)
RBC: 3.22 MIL/uL — ABNORMAL LOW (ref 3.87–5.11)
RDW: 12.5 % (ref 11.5–15.5)
WBC Count: 12.2 10*3/uL — ABNORMAL HIGH (ref 4.0–10.5)
nRBC: 0 % (ref 0.0–0.2)

## 2021-11-16 LAB — CMP (CANCER CENTER ONLY)
ALT: 11 U/L (ref 0–44)
AST: 13 U/L — ABNORMAL LOW (ref 15–41)
Albumin: 4.2 g/dL (ref 3.5–5.0)
Alkaline Phosphatase: 45 U/L (ref 38–126)
Anion gap: 10 (ref 5–15)
BUN: 33 mg/dL — ABNORMAL HIGH (ref 8–23)
CO2: 28 mmol/L (ref 22–32)
Calcium: 10.4 mg/dL — ABNORMAL HIGH (ref 8.9–10.3)
Chloride: 102 mmol/L (ref 98–111)
Creatinine: 1.52 mg/dL — ABNORMAL HIGH (ref 0.44–1.00)
GFR, Estimated: 34 mL/min — ABNORMAL LOW (ref >60)
Glucose, Bld: 142 mg/dL — ABNORMAL HIGH (ref 70–99)
Potassium: 4.3 mmol/L (ref 3.5–5.1)
Sodium: 140 mmol/L (ref 135–145)
Total Bilirubin: 0.6 mg/dL (ref 0.3–1.2)
Total Protein: 7 g/dL (ref 6.5–8.1)

## 2021-11-16 LAB — IRON AND TIBC
Iron: 77 ug/dL (ref 28–170)
Saturation Ratios: 26 % (ref 10.4–31.8)
TIBC: 297 ug/dL (ref 250–450)
UIBC: 220 ug/dL

## 2021-11-16 LAB — VITAMIN B12: Vitamin B-12: 391 pg/mL (ref 180–914)

## 2021-11-16 LAB — SAVE SMEAR(SSMR), FOR PROVIDER SLIDE REVIEW

## 2021-11-17 LAB — KAPPA/LAMBDA LIGHT CHAINS
Kappa free light chain: 47.8 mg/L — ABNORMAL HIGH (ref 3.3–19.4)
Kappa, lambda light chain ratio: 0.82 (ref 0.26–1.65)
Lambda free light chains: 58.5 mg/L — ABNORMAL HIGH (ref 5.7–26.3)

## 2021-11-17 LAB — ERYTHROPOIETIN: Erythropoietin: 10.4 m[IU]/mL (ref 2.6–18.5)

## 2021-11-19 LAB — MULTIPLE MYELOMA PANEL, SERUM
Albumin SerPl Elph-Mcnc: 3.9 g/dL (ref 2.9–4.4)
Albumin/Glob SerPl: 1.4 (ref 0.7–1.7)
Alpha 1: 0.2 g/dL (ref 0.0–0.4)
Alpha2 Glob SerPl Elph-Mcnc: 0.8 g/dL (ref 0.4–1.0)
B-Globulin SerPl Elph-Mcnc: 0.9 g/dL (ref 0.7–1.3)
Gamma Glob SerPl Elph-Mcnc: 1 g/dL (ref 0.4–1.8)
Globulin, Total: 2.8 g/dL (ref 2.2–3.9)
IgA: 458 mg/dL — ABNORMAL HIGH (ref 64–422)
IgG (Immunoglobin G), Serum: 687 mg/dL (ref 586–1602)
IgM (Immunoglobulin M), Srm: 76 mg/dL (ref 26–217)
M Protein SerPl Elph-Mcnc: 0.3 g/dL — ABNORMAL HIGH
Total Protein ELP: 6.7 g/dL (ref 6.0–8.5)

## 2021-11-23 ENCOUNTER — Telehealth: Payer: Self-pay

## 2021-11-23 ENCOUNTER — Other Ambulatory Visit: Payer: Self-pay | Admitting: Nurse Practitioner

## 2021-11-23 DIAGNOSIS — D649 Anemia, unspecified: Secondary | ICD-10-CM

## 2021-11-23 DIAGNOSIS — D72829 Elevated white blood cell count, unspecified: Secondary | ICD-10-CM

## 2021-11-23 NOTE — Telephone Encounter (Signed)
Patient gave verbal understanding and had not further question or concern

## 2021-11-23 NOTE — Telephone Encounter (Signed)
-----   Message from Owens Shark, NP sent at 11/23/2021 12:38 PM EST ----- Please let her know we would like to obtain repeat labs in approximately 1 month.

## 2021-12-07 DIAGNOSIS — M810 Age-related osteoporosis without current pathological fracture: Secondary | ICD-10-CM | POA: Diagnosis not present

## 2021-12-07 DIAGNOSIS — N184 Chronic kidney disease, stage 4 (severe): Secondary | ICD-10-CM | POA: Diagnosis not present

## 2021-12-07 DIAGNOSIS — E7800 Pure hypercholesterolemia, unspecified: Secondary | ICD-10-CM | POA: Diagnosis not present

## 2021-12-07 DIAGNOSIS — I10 Essential (primary) hypertension: Secondary | ICD-10-CM | POA: Diagnosis not present

## 2021-12-07 DIAGNOSIS — E113293 Type 2 diabetes mellitus with mild nonproliferative diabetic retinopathy without macular edema, bilateral: Secondary | ICD-10-CM | POA: Diagnosis not present

## 2021-12-07 DIAGNOSIS — G8929 Other chronic pain: Secondary | ICD-10-CM | POA: Diagnosis not present

## 2021-12-07 DIAGNOSIS — E1169 Type 2 diabetes mellitus with other specified complication: Secondary | ICD-10-CM | POA: Diagnosis not present

## 2021-12-08 ENCOUNTER — Telehealth: Payer: Self-pay | Admitting: Podiatry

## 2021-12-08 NOTE — Telephone Encounter (Signed)
Left message on machine for patient to call back and schedule appt to try on diabetic shoes

## 2021-12-10 ENCOUNTER — Ambulatory Visit: Payer: Medicare Other

## 2021-12-10 DIAGNOSIS — E1142 Type 2 diabetes mellitus with diabetic polyneuropathy: Secondary | ICD-10-CM

## 2021-12-10 NOTE — Progress Notes (Signed)
Patient presents today to pick up custom molded foot orthotics recommended by Dr. Consuelo Pandy  Orthotics were dispensed and fit was satisfactory. Reviewed instructions for break-in and wear. Written instructions given to patient.  Patient will follow up as needed.   [facility] - order # [id number]

## 2021-12-15 ENCOUNTER — Other Ambulatory Visit: Payer: Self-pay | Admitting: Family Medicine

## 2021-12-15 DIAGNOSIS — Z1231 Encounter for screening mammogram for malignant neoplasm of breast: Secondary | ICD-10-CM

## 2021-12-23 ENCOUNTER — Inpatient Hospital Stay: Payer: Medicare Other | Attending: Nurse Practitioner

## 2022-01-08 ENCOUNTER — Encounter (HOSPITAL_BASED_OUTPATIENT_CLINIC_OR_DEPARTMENT_OTHER): Payer: Self-pay

## 2022-01-08 ENCOUNTER — Emergency Department (HOSPITAL_BASED_OUTPATIENT_CLINIC_OR_DEPARTMENT_OTHER): Payer: Medicare Other

## 2022-01-08 ENCOUNTER — Encounter (HOSPITAL_COMMUNITY): Payer: Self-pay

## 2022-01-08 ENCOUNTER — Other Ambulatory Visit: Payer: Self-pay

## 2022-01-08 ENCOUNTER — Emergency Department (HOSPITAL_BASED_OUTPATIENT_CLINIC_OR_DEPARTMENT_OTHER)
Admission: EM | Admit: 2022-01-08 | Discharge: 2022-01-12 | Disposition: A | Discharge status: Home / Self Care | Payer: Medicare Other | Attending: Emergency Medicine | Admitting: Emergency Medicine

## 2022-01-08 DIAGNOSIS — R233 Spontaneous ecchymoses: Secondary | ICD-10-CM | POA: Diagnosis not present

## 2022-01-08 DIAGNOSIS — Z043 Encounter for examination and observation following other accident: Secondary | ICD-10-CM | POA: Diagnosis not present

## 2022-01-08 DIAGNOSIS — Y92524 Gas station as the place of occurrence of the external cause: Secondary | ICD-10-CM | POA: Insufficient documentation

## 2022-01-08 DIAGNOSIS — W19XXXA Unspecified fall, initial encounter: Secondary | ICD-10-CM

## 2022-01-08 DIAGNOSIS — M5136 Other intervertebral disc degeneration, lumbar region: Secondary | ICD-10-CM | POA: Diagnosis not present

## 2022-01-08 DIAGNOSIS — S24114A Complete lesion at T11-T12 level of thoracic spinal cord, initial encounter: Secondary | ICD-10-CM | POA: Diagnosis not present

## 2022-01-08 DIAGNOSIS — D72829 Elevated white blood cell count, unspecified: Secondary | ICD-10-CM | POA: Diagnosis not present

## 2022-01-08 DIAGNOSIS — T148XXA Other injury of unspecified body region, initial encounter: Secondary | ICD-10-CM

## 2022-01-08 DIAGNOSIS — M79604 Pain in right leg: Secondary | ICD-10-CM | POA: Diagnosis not present

## 2022-01-08 DIAGNOSIS — S300XXA Contusion of lower back and pelvis, initial encounter: Secondary | ICD-10-CM | POA: Insufficient documentation

## 2022-01-08 DIAGNOSIS — N281 Cyst of kidney, acquired: Secondary | ICD-10-CM | POA: Diagnosis not present

## 2022-01-08 DIAGNOSIS — W01198A Fall on same level from slipping, tripping and stumbling with subsequent striking against other object, initial encounter: Secondary | ICD-10-CM | POA: Insufficient documentation

## 2022-01-08 DIAGNOSIS — R531 Weakness: Secondary | ICD-10-CM | POA: Diagnosis not present

## 2022-01-08 DIAGNOSIS — Z1152 Encounter for screening for COVID-19: Secondary | ICD-10-CM | POA: Diagnosis not present

## 2022-01-08 DIAGNOSIS — Z794 Long term (current) use of insulin: Secondary | ICD-10-CM | POA: Insufficient documentation

## 2022-01-08 DIAGNOSIS — S8011XA Contusion of right lower leg, initial encounter: Secondary | ICD-10-CM | POA: Diagnosis not present

## 2022-01-08 DIAGNOSIS — Z743 Need for continuous supervision: Secondary | ICD-10-CM | POA: Diagnosis not present

## 2022-01-08 DIAGNOSIS — M25571 Pain in right ankle and joints of right foot: Secondary | ICD-10-CM | POA: Diagnosis not present

## 2022-01-08 DIAGNOSIS — Z79899 Other long term (current) drug therapy: Secondary | ICD-10-CM | POA: Diagnosis not present

## 2022-01-08 DIAGNOSIS — Z7982 Long term (current) use of aspirin: Secondary | ICD-10-CM | POA: Diagnosis not present

## 2022-01-08 DIAGNOSIS — S0990XA Unspecified injury of head, initial encounter: Secondary | ICD-10-CM | POA: Diagnosis not present

## 2022-01-08 DIAGNOSIS — S3991XA Unspecified injury of abdomen, initial encounter: Secondary | ICD-10-CM | POA: Diagnosis present

## 2022-01-08 DIAGNOSIS — M4326 Fusion of spine, lumbar region: Secondary | ICD-10-CM | POA: Diagnosis not present

## 2022-01-08 DIAGNOSIS — S301XXA Contusion of abdominal wall, initial encounter: Secondary | ICD-10-CM | POA: Insufficient documentation

## 2022-01-08 DIAGNOSIS — S7001XA Contusion of right hip, initial encounter: Secondary | ICD-10-CM | POA: Diagnosis not present

## 2022-01-08 DIAGNOSIS — E119 Type 2 diabetes mellitus without complications: Secondary | ICD-10-CM | POA: Diagnosis not present

## 2022-01-08 DIAGNOSIS — I700 Atherosclerosis of aorta: Secondary | ICD-10-CM | POA: Diagnosis not present

## 2022-01-08 DIAGNOSIS — M19071 Primary osteoarthritis, right ankle and foot: Secondary | ICD-10-CM | POA: Diagnosis not present

## 2022-01-08 DIAGNOSIS — M545 Low back pain, unspecified: Secondary | ICD-10-CM | POA: Diagnosis not present

## 2022-01-08 HISTORY — DX: Disorder of kidney and ureter, unspecified: N28.9

## 2022-01-08 LAB — CBC WITH DIFFERENTIAL/PLATELET
Abs Immature Granulocytes: 0.04 10*3/uL (ref 0.00–0.07)
Basophils Absolute: 0 10*3/uL (ref 0.0–0.1)
Basophils Relative: 0 %
Eosinophils Absolute: 0.2 10*3/uL (ref 0.0–0.5)
Eosinophils Relative: 1 %
HCT: 27.8 % — ABNORMAL LOW (ref 36.0–46.0)
Hemoglobin: 9.2 g/dL — ABNORMAL LOW (ref 12.0–15.0)
Immature Granulocytes: 0 %
Lymphocytes Relative: 19 %
Lymphs Abs: 2.1 10*3/uL (ref 0.7–4.0)
MCH: 30.3 pg (ref 26.0–34.0)
MCHC: 33.1 g/dL (ref 30.0–36.0)
MCV: 91.4 fL (ref 80.0–100.0)
Monocytes Absolute: 0.7 10*3/uL (ref 0.1–1.0)
Monocytes Relative: 6 %
Neutro Abs: 8.1 10*3/uL — ABNORMAL HIGH (ref 1.7–7.7)
Neutrophils Relative %: 74 %
Platelets: 313 10*3/uL (ref 150–400)
RBC: 3.04 MIL/uL — ABNORMAL LOW (ref 3.87–5.11)
RDW: 12.3 % (ref 11.5–15.5)
WBC: 11.1 10*3/uL — ABNORMAL HIGH (ref 4.0–10.5)
nRBC: 0 % (ref 0.0–0.2)

## 2022-01-08 LAB — URINALYSIS, ROUTINE W REFLEX MICROSCOPIC
Bilirubin Urine: NEGATIVE
Glucose, UA: NEGATIVE mg/dL
Hgb urine dipstick: NEGATIVE
Ketones, ur: NEGATIVE mg/dL
Leukocytes,Ua: NEGATIVE
Nitrite: NEGATIVE
Protein, ur: NEGATIVE mg/dL
Specific Gravity, Urine: 1.008 (ref 1.005–1.030)
pH: 6.5 (ref 5.0–8.0)

## 2022-01-08 LAB — COMPREHENSIVE METABOLIC PANEL
ALT: 9 U/L (ref 0–44)
AST: 16 U/L (ref 15–41)
Albumin: 4 g/dL (ref 3.5–5.0)
Alkaline Phosphatase: 43 U/L (ref 38–126)
Anion gap: 12 (ref 5–15)
BUN: 31 mg/dL — ABNORMAL HIGH (ref 8–23)
CO2: 27 mmol/L (ref 22–32)
Calcium: 9.6 mg/dL (ref 8.9–10.3)
Chloride: 99 mmol/L (ref 98–111)
Creatinine, Ser: 1.56 mg/dL — ABNORMAL HIGH (ref 0.44–1.00)
GFR, Estimated: 33 mL/min — ABNORMAL LOW (ref >60)
Glucose, Bld: 129 mg/dL — ABNORMAL HIGH (ref 70–99)
Potassium: 4.5 mmol/L (ref 3.5–5.1)
Sodium: 138 mmol/L (ref 135–145)
Total Bilirubin: 0.6 mg/dL (ref 0.3–1.2)
Total Protein: 6.8 g/dL (ref 6.5–8.1)

## 2022-01-08 LAB — TROPONIN I (HIGH SENSITIVITY)
Troponin I (High Sensitivity): 7 ng/L (ref <18)
Troponin I (High Sensitivity): 8 ng/L (ref <18)

## 2022-01-08 LAB — CK: Total CK: 128 U/L (ref 38–234)

## 2022-01-08 MED ORDER — FENTANYL CITRATE PF 50 MCG/ML IJ SOSY
50.0000 ug | PREFILLED_SYRINGE | INTRAMUSCULAR | Status: DC | PRN
  Filled 2022-01-08: qty 1

## 2022-01-08 MED ORDER — HYDROCODONE-ACETAMINOPHEN 5-325 MG PO TABS: 1.0000 | ORAL_TABLET | Freq: Once | ORAL | Status: DC

## 2022-01-08 MED ORDER — FENTANYL CITRATE PF 50 MCG/ML IJ SOSY
50.0000 ug | PREFILLED_SYRINGE | Freq: Once | INTRAMUSCULAR | Status: AC
  Administered 2022-01-08: 50 ug via INTRAVENOUS
  Filled 2022-01-08: qty 1

## 2022-01-08 MED ORDER — SODIUM CHLORIDE 0.9 % IV BOLUS
500.0000 mL | Freq: Once | INTRAVENOUS | Status: AC
  Administered 2022-01-08: 500 mL via INTRAVENOUS

## 2022-01-08 MED ORDER — ONDANSETRON HCL 4 MG/2ML IJ SOLN
4.0000 mg | Freq: Once | INTRAMUSCULAR | Status: AC
  Administered 2022-01-08: 4 mg via INTRAVENOUS
  Filled 2022-01-08: qty 2

## 2022-01-08 MED ORDER — FENTANYL CITRATE PF 50 MCG/ML IJ SOSY
25.0000 ug | PREFILLED_SYRINGE | Freq: Once | INTRAMUSCULAR | Status: AC
  Administered 2022-01-08: 25 ug via INTRAVENOUS

## 2022-01-08 MED ORDER — IOHEXOL 300 MG/ML  SOLN
100.0000 mL | Freq: Once | INTRAMUSCULAR | Status: AC | PRN
  Administered 2022-01-08: 80 mL via INTRAVENOUS

## 2022-01-08 NOTE — ED Notes (Signed)
RT placed pt on [place] 2 Lpm post pain medication admin, w/BLBS clear/dim. Pt sats preplacement 88%, post placement of oxygen 100% on the [place] 2 Lpm. Pt respiratory status otherwise stable w/no distress noted.

## 2022-01-08 NOTE — ED Provider Notes (Incomplete)
[facility] EMERGENCY DEPT Provider Note   CSN: [id number] Arrival date & time: 01/08/22  1748     History {Add pertinent medical, surgical, social history, OB history to HPI:1} Chief Complaint  Patient presents with   [name] is a 82 y.o. female, DMII, CVA, who presents to the ED who presents to the ED secondary to a fall that occurred about an hour ago when she was at the recitation using the bathroom.  She states she does not know what happened, but she thinks she may have slipped on some water urine on the floor, and she fell and struck her head against the door, and landed on her right side.  She complains of right hip pain, right belly pain, and right leg pain.  She denies any loss of consciousness, denies any chest pain, shortness of breath, dizziness.  But states "I just do not know what happened."  She is not on any blood thinners.  Is any confusion, nausea, vomiting.  Patient has slow speech at baseline secondary to prior CVA.     Home Medications Prior to Admission medications   Medication Sig Start Date End Date Taking? Authorizing Provider  ACETAMINOPHEN EXTRA STRENGTH PO 2 tablets    [provider]  Acetaminophen-Codeine 300-30 MG tablet acetaminophen 300 mg-codeine 30 mg tablet  Take 1 tablet as needed by oral route at bedtime for 20 days. Patient not taking: Reported on 11/16/2021    [provider]  albuterol (VENTOLIN HFA) 108 (90 Base) MCG/ACT inhaler 1 puff as needed 05/28/20   [provider]  amLODipine (NORVASC) 5 MG tablet Take 5 mg by mouth daily. 08/07/19   [provider]  atorvastatin (LIPITOR) 20 MG tablet Take 20 mg by mouth daily.  06/21/17   [provider]  azithromycin (ZITHROMAX) 250 MG tablet Take 250 mg by mouth as directed. 05/12/20   [provider]  B-D UF III MINI PEN NEEDLES 31G X 5 MM MISC Inject into the skin daily. 08/16/19   [provider]  Blood  Glucose Monitoring Suppl (ONETOUCH VERIO REFLECT) w/Device KIT 2 (two) times daily. as directed 03/27/20   [provider]  Blood Pressure Monitor DEVI See admin instructions. 03/27/20   [provider]  calcitRIOL (ROCALTROL) 0.25 MCG capsule 1 capsule    [provider]  calcium-vitamin D (OSCAL WITH D) 500-200 MG-UNIT per tablet Take 1 tablet by mouth daily with breakfast.    [provider]  Cholecalciferol (VITAMIN D3) 1.25 MG (50000 UT) CAPS Take 1 capsule by mouth once a week. 08/13/20   [provider]  clotrimazole-betamethasone (LOTRISONE) cream Apply topically 2 (two) times daily. 01/08/20   [provider]  colchicine 0.6 MG tablet Take by mouth. 11/15/14   [provider]  dipyridamole-aspirin (AGGRENOX) 200-25 MG 12hr capsule 1 capsule 08/15/19   [provider]  famotidine (PEPCID) 20 MG tablet Take 20 mg by mouth daily as needed for heartburn or indigestion.    [provider]  ferrous sulfate 325 (65 FE) MG tablet Take 325 mg by mouth daily with breakfast.    [provider]  FLUZONE HIGH-DOSE QUADRIVALENT 0.7 ML SUSY  10/13/20   [provider]  furosemide (LASIX) 20 MG tablet Take 1 tablet by mouth every other day.    [provider]  glucose blood (ONETOUCH VERIO) test strip See admin instructions. 03/27/20   [provider]  HYDROcodone-acetaminophen (NORCO/VICODIN) 5-325 MG tablet  Take 0.5-1 tablets by mouth 2 (two) times daily as needed for moderate pain. 12/14/18   Chase Picket, MD  hydrocortisone (ANUSOL-HC) 2.5 % rectal cream 1 application to affected area 02/12/19   [provider]  Insulin NPH, Human,, Isophane, (HUMULIN N KWIKPEN) 100 UNIT/ML Kiwkpen INJECT 10 UNITS UNDER THE SKIN EVERY MORNING AS DIRECTED    [provider]  levothyroxine (SYNTHROID) 137 MCG tablet 1 tablet on an empty stomach in the morning    [provider]   lisinopril (PRINIVIL,ZESTRIL) 40 MG tablet Take 40 mg by mouth daily.     [provider]  lisinopril (ZESTRIL) 20 MG tablet Take 20 mg by mouth daily. 01/19/21   [provider]  meclizine (ANTIVERT) 12.5 MG tablet Take 12.5 mg by mouth daily as needed for dizziness.    [provider]  mometasone (ELOCON) 0.1 % ointment Apply topically. 07/30/19   [provider]  montelukast (SINGULAIR) 10 MG tablet Take 10 mg by mouth daily as needed. 01/27/17   [provider]  [facility]  Creams-#11    [provider]  nystatin ointment (MYCOSTATIN) Apply 1 application topically 2 (two) times daily as needed for itching. 03/09/17   [provider]  OneTouch Delica Lancets 37T MISC See admin instructions. 03/27/20   [provider]  pantoprazole (PROTONIX) 40 MG tablet Take 40 mg by mouth daily. 12/19/18   [provider]  PFIZER COVID-19 VAC BIVALENT injection  10/13/20   [provider]  Polyethylene Glycol 3350 POWD 527 gram bottle 1 capfull of powder mixed with 8 ounces of liquid 04/06/18   [provider]  predniSONE (DELTASONE) 10 MG tablet See admin instructions. 05/28/20   [provider]  pregabalin (LYRICA) 100 MG capsule Take 1 capsule (100 mg total) by mouth every 8 (eight) hours. 05/12/17   Gerlene Fee, NP  pregabalin (LYRICA) 100 MG capsule 1 capsule 02/01/20   [provider]  RESTASIS 0.05 % ophthalmic emulsion 1 drop 2 (two) times daily. 02/01/20   [provider]  senna (SENOKOT) 8.6 MG TABS tablet Take 1 tablet by mouth 2 (two) times daily as needed for mild constipation. 04/27/17   [provider]  trimethoprim (TRIMPEX) 100 MG tablet Take 100 mg by mouth daily.    [provider]  valACYclovir (VALTREX) 1000 MG tablet 1 tablet    [provider]  escitalopram (LEXAPRO) 10 MG tablet Take 10 mg by mouth daily.    06/28/10   [provider]      Allergies    Pioglitazone, Actos [pioglitazone hydrochloride], Amlodipine besylate, Cephalexin, Lorazepam, Oxycodone, Oxycodone-acetaminophen, Penicillin g benzathine, Rosiglitazone, Tramadol, and Tramadol hcl    Review of Systems   Review of Systems  Respiratory:  Negative for shortness of breath.   Cardiovascular:  Negative for chest pain.  Musculoskeletal:  Positive for back pain.  Neurological:  Negative for dizziness and headaches.    Physical Exam Updated Vital Signs BP (!) 135/52 (BP Location: Right Arm)   Pulse 66   Temp 98.5 F (36.9 C) (Oral)   Resp 20   Ht '5\' 4"'$  (1.626 m)   Wt 108.9 kg   SpO2 98%   BMI 41.20 kg/m  Physical Exam Vitals and nursing note reviewed.  Constitutional:      General: She is not in acute distress.    Appearance: She is well-developed.  HENT:     Head: Normocephalic and atraumatic.  Eyes:  Conjunctiva/sclera: Conjunctivae normal.  Cardiovascular:     Rate and Rhythm: Normal rate and regular rhythm.     Heart sounds: No murmur heard. Pulmonary:     Effort: Pulmonary effort is normal. No respiratory distress.     Breath sounds: Normal breath sounds.  Abdominal:     Palpations: Abdomen is soft.     Tenderness: There is abdominal tenderness.     Comments: Tenderness to palpation of right flank  Musculoskeletal:        General: No swelling.     Cervical back: Neck supple.     Comments: Tenderness to palpation of thoracic, lumbar spine no step-offs noted.  Palpation of tibia/fibula of right lower extremity.  Also tender to lateral aspect and posterior aspect of right hip.  Skin:    General: Skin is warm and dry.     Capillary Refill: Capillary refill takes less than 2 seconds.  Neurological:     Mental Status: She is alert.  Psychiatric:        Mood and Affect: Mood normal.     ED Results / Procedures / Treatments   Labs (all labs ordered are listed, but only abnormal results are displayed) Labs  Reviewed - No data to display  EKG None  Radiology No results found.  Procedures Procedures  {Document cardiac monitor, telemetry assessment procedure when appropriate:1}  Medications Ordered in ED Medications - No data to display  ED Course/ Medical Decision Making/ A&P                           Medical Decision Making Amount and/or Complexity of Data Reviewed Labs: ordered. Radiology: ordered.  Risk Prescription drug management.   ***  {Document critical care time when appropriate:1} {Document review of labs and clinical decision tools ie heart score, Chads2Vasc2 etc:1}  {Document your independent review of radiology images, and any outside records:1} {Document your discussion with family members, caretakers, and with consultants:1} {Document social determinants of health affecting pt's care:1} {Document your decision making why or why not admission, treatments were needed:1} Final Clinical Impression(s) / ED Diagnoses Final diagnoses:  None    Rx / DC Orders ED Discharge Orders     None

## 2022-01-08 NOTE — ED Triage Notes (Signed)
Pt BIB [facility] who had a mechanical fall while in the restroom. Pt with injury to R lower back and hip. Pt did strike the R side of her head on the door. Denies LOC. Pt is not on anticoagulants.

## 2022-01-09 DIAGNOSIS — S301XXA Contusion of abdominal wall, initial encounter: Secondary | ICD-10-CM | POA: Diagnosis not present

## 2022-01-09 DIAGNOSIS — Z1152 Encounter for screening for COVID-19: Secondary | ICD-10-CM | POA: Diagnosis not present

## 2022-01-09 DIAGNOSIS — M79604 Pain in right leg: Secondary | ICD-10-CM | POA: Diagnosis not present

## 2022-01-09 DIAGNOSIS — Z794 Long term (current) use of insulin: Secondary | ICD-10-CM | POA: Diagnosis not present

## 2022-01-09 DIAGNOSIS — Y92524 Gas station as the place of occurrence of the external cause: Secondary | ICD-10-CM | POA: Diagnosis not present

## 2022-01-09 DIAGNOSIS — D72829 Elevated white blood cell count, unspecified: Secondary | ICD-10-CM | POA: Diagnosis not present

## 2022-01-09 DIAGNOSIS — S300XXA Contusion of lower back and pelvis, initial encounter: Secondary | ICD-10-CM | POA: Diagnosis not present

## 2022-01-09 DIAGNOSIS — E119 Type 2 diabetes mellitus without complications: Secondary | ICD-10-CM | POA: Diagnosis not present

## 2022-01-09 DIAGNOSIS — S3991XA Unspecified injury of abdomen, initial encounter: Secondary | ICD-10-CM | POA: Diagnosis present

## 2022-01-09 DIAGNOSIS — S7001XA Contusion of right hip, initial encounter: Secondary | ICD-10-CM | POA: Diagnosis not present

## 2022-01-09 DIAGNOSIS — W01198A Fall on same level from slipping, tripping and stumbling with subsequent striking against other object, initial encounter: Secondary | ICD-10-CM | POA: Diagnosis not present

## 2022-01-09 DIAGNOSIS — S8011XA Contusion of right lower leg, initial encounter: Secondary | ICD-10-CM | POA: Diagnosis not present

## 2022-01-09 DIAGNOSIS — Z7982 Long term (current) use of aspirin: Secondary | ICD-10-CM | POA: Diagnosis not present

## 2022-01-09 DIAGNOSIS — R531 Weakness: Secondary | ICD-10-CM | POA: Diagnosis not present

## 2022-01-09 LAB — CBG MONITORING, ED
Glucose-Capillary: 148 mg/dL — ABNORMAL HIGH (ref 70–99)
Glucose-Capillary: 140 mg/dL — ABNORMAL HIGH (ref 70–99)
Glucose-Capillary: 196 mg/dL — ABNORMAL HIGH (ref 70–99)
Glucose-Capillary: 140 mg/dL — ABNORMAL HIGH (ref 70–99)
Glucose-Capillary: 141 mg/dL — ABNORMAL HIGH (ref 70–99)

## 2022-01-09 MED ORDER — FERROUS SULFATE 325 (65 FE) MG PO TABS
325.0000 mg | ORAL_TABLET | Freq: Every day | ORAL | Status: DC
  Administered 2022-01-09 – 2022-01-12 (×4): 325 mg via ORAL
  Filled 2022-01-09 (×4): qty 1

## 2022-01-09 MED ORDER — PREGABALIN 50 MG PO CAPS
100.0000 mg | ORAL_CAPSULE | Freq: Two times a day (BID) | ORAL | Status: DC
  Administered 2022-01-09 – 2022-01-12 (×8): 100 mg via ORAL
  Filled 2022-01-09 (×8): qty 2

## 2022-01-09 MED ORDER — INSULIN NPH (HUMAN) (ISOPHANE) 100 UNIT/ML ~~LOC~~ SUSP
12.0000 [IU] | Freq: Every day | SUBCUTANEOUS | Status: DC
  Administered 2022-01-09 – 2022-01-12 (×4): 12 [IU] via SUBCUTANEOUS
  Filled 2022-01-09 (×2): qty 10

## 2022-01-09 MED ORDER — ALBUTEROL SULFATE (2.5 MG/3ML) 0.083% IN NEBU: 2.5000 mg | INHALATION_SOLUTION | Freq: Four times a day (QID) | RESPIRATORY_TRACT | Status: DC | PRN

## 2022-01-09 MED ORDER — INSULIN ISOPHANE HUMAN 100 UNIT/ML KWIKPEN: 12.0000 [IU] | PEN_INJECTOR | Freq: Every morning | SUBCUTANEOUS | Status: DC

## 2022-01-09 MED ORDER — AMLODIPINE BESYLATE 5 MG PO TABS
5.0000 mg | ORAL_TABLET | Freq: Every day | ORAL | Status: DC
  Administered 2022-01-10 – 2022-01-12 (×3): 5 mg via ORAL
  Filled 2022-01-09 (×4): qty 1

## 2022-01-09 MED ORDER — ATORVASTATIN CALCIUM 10 MG PO TABS
20.0000 mg | ORAL_TABLET | Freq: Every day | ORAL | Status: DC
  Administered 2022-01-09 – 2022-01-12 (×4): 20 mg via ORAL
  Filled 2022-01-09 (×4): qty 2

## 2022-01-09 MED ORDER — LISINOPRIL 10 MG PO TABS
20.0000 mg | ORAL_TABLET | Freq: Every day | ORAL | Status: DC
  Administered 2022-01-09 – 2022-01-12 (×4): 20 mg via ORAL
  Filled 2022-01-09 (×4): qty 1

## 2022-01-09 MED ORDER — ALBUTEROL SULFATE HFA 108 (90 BASE) MCG/ACT IN AERS: 2.0000 | INHALATION_SPRAY | Freq: Four times a day (QID) | RESPIRATORY_TRACT | Status: DC | PRN

## 2022-01-09 MED ORDER — COLCHICINE 0.6 MG PO TABS
0.6000 mg | ORAL_TABLET | Freq: Two times a day (BID) | ORAL | Status: DC
  Administered 2022-01-10 – 2022-01-12 (×4): 0.6 mg via ORAL
  Filled 2022-01-09 (×9): qty 1

## 2022-01-09 MED ORDER — FAMOTIDINE 20 MG PO TABS: 20.0000 mg | ORAL_TABLET | Freq: Every day | ORAL | Status: DC | PRN

## 2022-01-09 MED ORDER — HYDROCODONE-ACETAMINOPHEN 5-325 MG PO TABS
0.5000 | ORAL_TABLET | Freq: Two times a day (BID) | ORAL | Status: DC | PRN
  Administered 2022-01-09 – 2022-01-12 (×6): 1 via ORAL
  Filled 2022-01-09 (×7): qty 1

## 2022-01-09 MED ORDER — DICLOFENAC SODIUM 1 % EX GEL
2.0000 g | Freq: Two times a day (BID) | CUTANEOUS | Status: DC | PRN
  Administered 2022-01-09 – 2022-01-11 (×2): 2 g via TOPICAL
  Filled 2022-01-09: qty 100

## 2022-01-09 MED ORDER — LEVOTHYROXINE SODIUM 137 MCG PO TABS
137.0000 ug | ORAL_TABLET | Freq: Every day | ORAL | Status: DC
  Administered 2022-01-09 – 2022-01-12 (×4): 137 ug via ORAL
  Filled 2022-01-09 (×7): qty 1

## 2022-01-09 MED ORDER — FUROSEMIDE 40 MG PO TABS
20.0000 mg | ORAL_TABLET | ORAL | Status: DC
  Administered 2022-01-11: 20 mg via ORAL
  Filled 2022-01-09 (×2): qty 1

## 2022-01-09 NOTE — NC FL2 (Signed)
[place] LEVEL OF CARE FORM     IDENTIFICATION  Patient Name: [name] Birthdate: [birth year] Sex: female Admission Date (Current Location): 01/08/2022  [facility] and [place] Number:  Herbalist and Address:  [facility],  [address] [place], [place]      Provider Number: [id number]  Attending Physician Name and Address:  Default, Provider, MD  Relative Name and Phone Number:  Lowella Dell [phone]    Current Level of Care: Hospital Recommended Level of Care: [place] Prior Approval Number:    Date Approved/Denied:   PASRR Number: [id number]  Discharge Plan: SNF    Current Diagnoses: Patient Active Problem List   Diagnosis Date Noted   Hypocalcemia 10/24/2020   Malignant tumor of thyroid gland ([place]) 10/24/2020   Age-related osteoporosis without current pathological fracture 06/13/2020   CVA, old, speech/language deficit 06/13/2020   Generalized abdominal pain 06/13/2020   Long term (current) use of insulin ([place]) 06/13/2020   Osteoporosis without current pathological fracture 06/13/2020   Pure hypercholesterolemia 06/13/2020   Pain in joint of right shoulder 06/26/2019   Iron deficiency anemia, unspecified 03/14/2018   Chronic pain syndrome 11/04/2017   Thoracic radiculopathy 11/04/2017   Pes anserinus bursitis of right knee 08/12/2017   GERD without esophagitis 05/12/2017   Pleuritic chest pain 05/04/2017   Medication monitoring encounter 05/04/2017   Dyslipidemia associated with type 2 diabetes mellitus ([place]) 04/20/2017   Hypertension associated with diabetes ([place]) 04/20/2017   Cerebrovascular accident (CVA) with involvement of right side of body ([place]) 04/20/2017   Peripheral autonomic neuropathy due to diabetes mellitus ([place]) 04/20/2017   Hypertensive renal disease with renal failure, stage 1-4 or unspecified chronic kidney disease 04/20/2017   Constipation 04/06/2017   Status post lumbar  spinal fusion 04/03/2017   Recurrent UTI 04/03/2017   Chronic renal insufficiency 04/03/2017   Normocytic anemia 04/03/2017   Osteomyelitis of thoracic spine ([place]) 04/02/2017   Thoracic discitis 04/02/2017   DDD (degenerative disc disease), lumbar 12/31/2016   Narcotic dependence ([place]) 10/24/2015   Periprosthetic fracture around internal prosthetic right knee joint 04/25/2015   Fracture, femur, distal, right, closed, initial encounter 04/24/2015   Closed fracture of distal end of right femur, initial encounter ([place]) 04/24/2015   Acute gout 10/17/2013   Hypoparathyroidism ([place]) 03/02/2012   Benign essential tremor 09/28/2010   Combined hyperlipidemia associated with type 2 diabetes mellitus ([place]) 09/26/2009   Osteopenia 09/26/2009   Postoperative hypothyroidism 09/26/2009   Multiple pulmonary nodules 08/22/2009   OSTEOARTHRITIS, KNEES, BILATERAL 08/07/2009   Allergic rhinitis 11/19/2008   CVA, old, hemiparesis ([place]) 10/16/2008   History of thyroid cancer 09/26/2008   Spinal stenosis of lumbar region 03/26/2008   Hypothyroidism 07/03/2007   Insulin dependent type 2 diabetes mellitus, controlled ([place]) 07/03/2007   Spinal epidural abscess 07/03/2007   Benign essential HTN 07/03/2007   PSOAS MUSCLE ABSCESS 06/09/2007    Orientation RESPIRATION BLADDER Height & Weight     Self, Place, Situation, Time  Normal Continent Weight: 240 lb (108.9 kg) Height:  '5\' 4"'$  (162.6 cm)  BEHAVIORAL SYMPTOMS/MOOD NEUROLOGICAL BOWEL NUTRITION STATUS      Continent Diet (Carb Modified)  AMBULATORY STATUS COMMUNICATION OF NEEDS Skin   Limited Assist Verbally Normal                       Personal Care Assistance Level of Assistance  Bathing, Feeding, Dressing Bathing Assistance: Limited assistance Feeding assistance: Independent Dressing Assistance: Limited  assistance     Functional Limitations Info  Sight, Hearing, Speech Sight Info: Adequate Hearing Info: Adequate Speech Info: Adequate     SPECIAL CARE FACTORS FREQUENCY  PT (By licensed PT), OT (By licensed OT)     PT Frequency: 5 times a week. OT Frequency: 5 times a week.            Contractures Contractures Info: Not present    Additional Factors Info  Code Status, Allergies Code Status Info: Full Code Allergies Info: Pioglitazone, Actos (pioglitazone hydrochloride), Cephalexin, Lorazepam, Oxycodone, Oxycodone-Actaminophen, Penicillin G Benzathine, Rosiglitazone, Tramadol, and Tramadol Hcl           Current Medications (01/09/2022):  This is the current hospital active medication list Current Facility-Administered Medications  Medication Dose Route Frequency Provider Last Rate Last Admin   albuterol (PROVENTIL) (2.5 MG/3ML) 0.083% nebulizer solution 2.5 mg  2.5 mg Inhalation Q6H PRN Hayden Rasmussen, MD       amLODipine (NORVASC) tablet 5 mg  5 mg Oral Daily Small, Brooke L, PA       atorvastatin (LIPITOR) tablet 20 mg  20 mg Oral Daily Small, Brooke L, PA   20 mg at 01/09/22 1029   diclofenac Sodium (VOLTAREN) 1 % topical gel 2 g  2 g Topical BID PRN Quintella Reichert, MD       famotidine (PEPCID) tablet 20 mg  20 mg Oral Daily PRN Small, Brooke L, PA       ferrous sulfate tablet 325 mg  325 mg Oral Q breakfast Small, Brooke L, PA   325 mg at 01/09/22 1029   furosemide (LASIX) tablet 20 mg  20 mg Oral QODAY Small, Brooke L, PA       HYDROcodone-acetaminophen (NORCO/VICODIN) 5-325 MG per tablet 0.5-1 tablet  0.5-1 tablet Oral BID PRN Small, Brooke L, PA   1 tablet at 01/09/22 1040   insulin NPH Human (NOVOLIN N) injection 12 Units  12 Units Subcutaneous QAC breakfast Hayden Rasmussen, MD       levothyroxine (SYNTHROID) tablet 137 mcg  137 mcg Oral Q0600 Small, Brooke L, PA   137 mcg at 01/09/22 0700   lisinopril (ZESTRIL) tablet 20 mg  20 mg Oral Daily Small, Brooke L, PA   20 mg at 01/09/22 1029   pregabalin (LYRICA) capsule 100 mg  100 mg Oral BID Small, Brooke L, PA   100 mg at 01/09/22 1030   Current  Outpatient Medications  Medication Sig Dispense Refill   ACETAMINOPHEN EXTRA STRENGTH PO 2 tablets     Acetaminophen-Codeine 300-30 MG tablet acetaminophen 300 mg-codeine 30 mg tablet  Take 1 tablet as needed by oral route at bedtime for 20 days. (Patient not taking: Reported on 11/16/2021)     albuterol (VENTOLIN HFA) 108 (90 Base) MCG/ACT inhaler 1 puff as needed     amLODipine (NORVASC) 5 MG tablet Take 5 mg by mouth daily.     atorvastatin (LIPITOR) 20 MG tablet Take 20 mg by mouth daily.   0   azithromycin (ZITHROMAX) 250 MG tablet Take 250 mg by mouth as directed.     B-D UF III MINI PEN NEEDLES 31G X 5 MM MISC Inject into the skin daily.     Blood Glucose Monitoring Suppl (ONETOUCH VERIO REFLECT) w/Device KIT 2 (two) times daily. as directed     Blood Pressure Monitor DEVI See admin instructions.     calcitRIOL (ROCALTROL) 0.25 MCG capsule 1 capsule     calcium-vitamin D (OSCAL WITH  D) 500-200 MG-UNIT per tablet Take 1 tablet by mouth daily with breakfast.     Cholecalciferol (VITAMIN D3) 1.25 MG (50000 UT) CAPS Take 1 capsule by mouth once a week.     clotrimazole-betamethasone (LOTRISONE) cream Apply topically 2 (two) times daily.     colchicine 0.6 MG tablet Take by mouth.     dipyridamole-aspirin (AGGRENOX) 200-25 MG 12hr capsule 1 capsule     famotidine (PEPCID) 20 MG tablet Take 20 mg by mouth daily as needed for heartburn or indigestion.     ferrous sulfate 325 (65 FE) MG tablet Take 325 mg by mouth daily with breakfast.     FLUZONE HIGH-DOSE QUADRIVALENT 0.7 ML SUSY  (Patient not taking: Reported on 11/16/2021)     furosemide (LASIX) 20 MG tablet Take 1 tablet by mouth every other day.     glucose blood (ONETOUCH VERIO) test strip See admin instructions.     HYDROcodone-acetaminophen (NORCO/VICODIN) 5-325 MG tablet Take 0.5-1 tablets by mouth 2 (two) times daily as needed for moderate pain.     hydrocortisone (ANUSOL-HC) 2.5 % rectal cream 1 application to affected area      Insulin NPH, Human,, Isophane, (HUMULIN N KWIKPEN) 100 UNIT/ML Kiwkpen INJECT 10 UNITS UNDER THE SKIN EVERY MORNING AS DIRECTED     levothyroxine (SYNTHROID) 137 MCG tablet 1 tablet on an empty stomach in the morning     lisinopril (PRINIVIL,ZESTRIL) 40 MG tablet Take 40 mg by mouth daily.      lisinopril (ZESTRIL) 20 MG tablet Take 20 mg by mouth daily.     meclizine (ANTIVERT) 12.5 MG tablet Take 12.5 mg by mouth daily as needed for dizziness.     mometasone (ELOCON) 0.1 % ointment Apply topically.     montelukast (SINGULAIR) 10 MG tablet Take 10 mg by mouth daily as needed.  5   NON [facility]  Creams-#11     nystatin ointment (MYCOSTATIN) Apply 1 application topically 2 (two) times daily as needed for itching.  0   OneTouch Delica Lancets 38H MISC See admin instructions.     pantoprazole (PROTONIX) 40 MG tablet Take 40 mg by mouth daily.     PFIZER COVID-19 VAC BIVALENT injection  (Patient not taking: Reported on 11/16/2021)     Polyethylene Glycol 3350 POWD 527 gram bottle 1 capfull of powder mixed with 8 ounces of liquid     predniSONE (DELTASONE) 10 MG tablet See admin instructions.     pregabalin (LYRICA) 100 MG capsule Take 1 capsule (100 mg total) by mouth every 8 (eight) hours. 90 capsule 0   pregabalin (LYRICA) 100 MG capsule 1 capsule     RESTASIS 0.05 % ophthalmic emulsion 1 drop 2 (two) times daily.     senna (SENOKOT) 8.6 MG TABS tablet Take 1 tablet by mouth 2 (two) times daily as needed for mild constipation.     trimethoprim (TRIMPEX) 100 MG tablet Take 100 mg by mouth daily.     valACYclovir (VALTREX) 1000 MG tablet 1 tablet       Discharge Medications: Please see discharge summary for a list of discharge medications.  Relevant Imaging Results:  Relevant Lab Results:   Additional Information SSN:  [id number]     HT:  '5\' 4"'$            WT:  240 lbs          BMI:  41.20 kg/m2  Chinara Hertzberg C Tarpley-Carter, LCSWA

## 2022-01-09 NOTE — ED Notes (Signed)
PT report called to [place] RN @ [facility], verbalized complete understanding of Pt current condition and plan of care, denies questions at this time. Pt remains a/o x 4 skin warm dry intact. VSS NAD PT continues on 2L [place].

## 2022-01-09 NOTE — Evaluation (Signed)
Physical Therapy Evaluation Patient Details Name: [name] MRN: [id number] DOB: October 29, 1940 Today's Date: 01/09/2022  History of Present Illness  [name] is a 82 y.o. female, DMII, CVA, who presents to the ED who presents to the ED secondary to a fall at a gas station. Imaging tests are negative for acute injury.  Clinical Impression  Pt admitted with above diagnosis.  Pt currently with functional limitations due to the deficits listed below (see PT Problem List). Pt will benefit from skilled PT to increase their independence and safety with mobility to allow discharge to the venue listed below.  Recommend SNF as she lives alone and is not safe to d/c to her apartment at her current level. She was limited by pain in her R md-low back stating "it feels like something is moving in there".        Recommendations for follow up therapy are one component of a multi-disciplinary discharge planning process, led by the attending physician.  Recommendations may be updated based on patient status, additional functional criteria and insurance authorization.  Follow Up Recommendations Skilled nursing-short term rehab (<3 hours/day) Can patient physically be transported by private vehicle: No    Assistance Recommended at Discharge Intermittent Supervision/Assistance  Patient can return home with the following  A little help with walking and/or transfers;A little help with bathing/dressing/bathroom    Equipment Recommendations None recommended by PT  Recommendations for Other Services       Functional Status Assessment Patient has had a recent decline in their functional status and demonstrates the ability to make significant improvements in function in a reasonable and predictable amount of time.     Precautions / Restrictions Precautions Precautions: Fall Restrictions Weight Bearing Restrictions: No      Mobility  Bed Mobility Overal bed mobility: Needs Assistance Bed Mobility:  Sit to Supine, Supine to Sit     Supine to sit: Mod assist Sit to supine: Mod assist, +2 for physical assistance   General bed mobility comments: Attempted to have her log roll, but she found that to be too painful    Transfers Overall transfer level: Needs assistance Equipment used: Rolling walker (2 wheels) Transfers: Sit to/from Stand Sit to Stand: Mod assist, +2 physical assistance, From elevated surface           General transfer comment: Unable to pushup off of bed and had both hands on RW with bed elevated    Ambulation/Gait Ambulation/Gait assistance: Min assist, +2 safety/equipment Gait Distance (Feet): 4 Feet Assistive device: Rolling walker (2 wheels) Gait Pattern/deviations: Step-to pattern       General Gait Details: Side stepping in front of bed with A to move RW.  Stairs            Wheelchair Mobility    Modified Rankin (Stroke Patients Only)       Balance Overall balance assessment: History of Falls                                           Pertinent Vitals/Pain Pain Assessment Pain Assessment: Faces Faces Pain Scale: Hurts whole lot Pain Location: R mid back Pain Descriptors / Indicators: Grimacing, Moaning Pain Intervention(s): Monitored during session, Repositioned    Home Living Family/patient expects to be discharged to:: Skilled nursing facility Living Arrangements: Alone  Prior Function Prior Level of Function : Driving;History of Falls (last six months)             Mobility Comments: Lives at the [place] and ambulates with what sounds like a RW with extensions to make it taller, possibly a platform RW?       Hand Dominance        Extremity/Trunk Assessment   Upper Extremity Assessment Upper Extremity Assessment: Defer to OT evaluation    Lower Extremity Assessment Lower Extremity Assessment: Generalized weakness    Cervical / Trunk Assessment Cervical /  Trunk Assessment: Other exceptions Cervical / Trunk Exceptions: pain in low back and hx of back surgery  Communication   Communication: Expressive difficulties  Cognition Arousal/Alertness: Awake/alert Behavior During Therapy: WFL for tasks assessed/performed Overall Cognitive Status: Within Functional Limits for tasks assessed                                          General Comments      Exercises     Assessment/Plan    PT Assessment Patient needs continued PT services  PT Problem List Decreased strength;Decreased activity tolerance;Decreased balance;Decreased mobility;Pain       PT Treatment Interventions DME instruction;Gait training;Functional mobility training;Therapeutic activities;Therapeutic exercise;Balance training    PT Goals (Current goals can be found in the Care Plan section)  Acute Rehab PT Goals Patient Stated Goal: Go to rehab PT Goal Formulation: With patient Time For Goal Achievement: 01/23/22 Potential to Achieve Goals: Good    Frequency Min 2X/week     Co-evaluation               AM-PAC PT "6 Clicks" Mobility  Outcome Measure Help needed turning from your back to your side while in a flat bed without using bedrails?: A Lot Help needed moving from lying on your back to sitting on the side of a flat bed without using bedrails?: A Lot Help needed moving to and from a bed to a chair (including a wheelchair)?: A Little Help needed standing up from a chair using your arms (e.g., wheelchair or bedside chair)?: A Lot Help needed to walk in hospital room?: A Lot Help needed climbing 3-5 steps with a railing? : Total 6 Click Score: 12    End of Session Equipment Utilized During Treatment: Gait belt Activity Tolerance: Patient limited by pain Patient left: in bed;with nursing/sitter in room Nurse Communication: Mobility status PT Visit Diagnosis: Pain;Other abnormalities of gait and mobility (R26.89);Difficulty in walking, not  elsewhere classified (R26.2) Pain - Right/Left: Right Pain - part of body:  (low back)    Time: 8280-0349 PT Time Calculation (min) (ACUTE ONLY): 24 min   Charges:   PT Evaluation $PT Eval Moderate Complexity: 1 Mod PT Treatments $Therapeutic Activity: 8-22 mins        Lydia Guiles., PT Office [phone] Acute Rehab 01/09/2022   Galen Manila 01/09/2022, 10:27 AM

## 2022-01-09 NOTE — ED Notes (Signed)
Patient has been alert this shift. Cooperative with care.  Medication compliant.  Confusion noted at times.  Patient  needs assist with ADLs.

## 2022-01-09 NOTE — ED Notes (Signed)
Pt give cranberry juice and graham crackers.

## 2022-01-10 ENCOUNTER — Emergency Department (HOSPITAL_COMMUNITY): Payer: Medicare Other

## 2022-01-10 DIAGNOSIS — M25571 Pain in right ankle and joints of right foot: Secondary | ICD-10-CM | POA: Diagnosis not present

## 2022-01-10 DIAGNOSIS — M19071 Primary osteoarthritis, right ankle and foot: Secondary | ICD-10-CM | POA: Diagnosis not present

## 2022-01-10 LAB — URINALYSIS, ROUTINE W REFLEX MICROSCOPIC
Bilirubin Urine: NEGATIVE
Glucose, UA: NEGATIVE mg/dL
Hgb urine dipstick: NEGATIVE
Ketones, ur: NEGATIVE mg/dL
Leukocytes,Ua: NEGATIVE
Nitrite: NEGATIVE
Protein, ur: NEGATIVE mg/dL
Specific Gravity, Urine: 1.004 — ABNORMAL LOW (ref 1.005–1.030)
pH: 6 (ref 5.0–8.0)

## 2022-01-10 LAB — CBG MONITORING, ED: Glucose-Capillary: 159 mg/dL — ABNORMAL HIGH (ref 70–99)

## 2022-01-10 MED ORDER — ACETAMINOPHEN 500 MG PO TABS
1000.0000 mg | ORAL_TABLET | Freq: Once | ORAL | Status: AC
  Administered 2022-01-10: 1000 mg via ORAL
  Filled 2022-01-10: qty 2

## 2022-01-10 NOTE — Progress Notes (Signed)
TOC CSW spoke with pt.  Pt has accepted [facility].  CSW also confirmed bed offer with Janie @ [facility].  Aleda Madl Tarpley-Carter, MSW, LCSW-A Pronouns:  She/Her/Hers [facility] Health[facility]itions of Care Clinical Social Worker Direct Number:  [phone] [email or web address]

## 2022-01-10 NOTE — ED Provider Notes (Signed)
Emergency Medicine Observation Re-evaluation Note  [name] is a 82 y.o. female, seen on rounds today.  Pt initially presented to the ED for complaints of Fall Currently, the patient is awaiting placement.  Describes pain limiting ambulation. Had work up including CT chest/abdomen/pelvis, CT T/L spine, and XR tibia, CT head and I reviewed these results and agree do not see emergent indication for admission.  Also have low suspicion for acute CVA.  Added on XR ankle and foot given tenderness in these areas which were reviewed and show no acute fracture.  Physical Exam  BP (!) 139/55   Pulse 65   Temp 98 F (36.7 C) (Oral)   Resp 18   Ht '5\' 4"'$  (1.626 m)   Wt 108.9 kg   SpO2 90%   BMI 41.20 kg/m  Physical Exam General: NAD Cardiac: RRR, normal distal pulses Lungs: even, unlabored Psych: normal affect  ED Course / MDM  EKG:EKG Interpretation  Date/Time:  Friday January 08 2022 18:24:55 EST Ventricular Rate:  60 PR Interval:  218 QRS Duration: 100 QT Interval:  415 QTC Calculation: 415 R Axis:   1 Text Interpretation: Sinus rhythm Borderline prolonged PR interval No significant change since prior 7/22 Confirmed by Aletta Edouard [phone]) on 01/08/2022 6:29:19 PM  I have reviewed the labs performed to date as well as medications administered while in observation.  Recent changes in the last 24 hours include none.  Plan  Current plan is for awaiting PT/OT likely placement. Gareth Morgan, MD 01/11/22 1052

## 2022-01-10 NOTE — Progress Notes (Signed)
Pts Josem Kaufmann has been started for [employer]	[place] through [place].  Selma Rodelo Tarpley-Carter, MSW, LCSW-A Pronouns:  She/Her/Hers [facility] HealthTransitions of Care Clinical Social Worker Direct Number:  [phone] Sofija [email or web address]

## 2022-01-10 NOTE — Evaluation (Signed)
Occupational Therapy Evaluation Patient Details Name: [name] MRN: [id number] DOB: 1940-01-17 Today's Date: 01/10/2022   History of Present Illness [name] is a 82 y.o. female who presents to the ED who presents to the ED secondary to a fall at a gas station. Imaging tests are negative for acute injury.PMH: DMII, CVA,   Clinical Impression   Patient is a 82 year old female who presented to the hospital for above. Patient was living at home alone with caregiver 3x a week to assist with bathing. Patient currently is max A for bed mobility, TD for LB dress/bathing tasks and max A for transfer with RW  with increased pain noted with all movements when pain medication is onboard. Patient was noted to have decreased functional activity tolerance, decreased endurance, decreased standing balance, decreased safety awareness, and decreased knowledge of AD/AE impacting participation in ADLs. Patient would continue to benefit from skilled OT services at this time while admitted and after d/c to address noted deficits in order to improve overall safety and independence in ADLs.       Recommendations for follow up therapy are one component of a multi-disciplinary discharge planning process, led by the attending physician.  Recommendations may be updated based on patient status, additional functional criteria and insurance authorization.   Follow Up Recommendations  Skilled nursing-short term rehab (<3 hours/day)     Assistance Recommended at Discharge Frequent or constant Supervision/Assistance  Patient can return home with the following Two people to help with walking and/or transfers;A lot of help with bathing/dressing/bathroom;Assistance with cooking/housework;Assist for transportation;Direct supervision/assist for medications management;Help with stairs or ramp for entrance;Direct supervision/assist for financial management    Functional Status Assessment  Patient has had a recent  decline in their functional status and demonstrates the ability to make significant improvements in function in a reasonable and predictable amount of time.  Equipment Recommendations  None recommended by OT    Recommendations for Other Services       Precautions / Restrictions Precautions Precautions: Fall Restrictions Weight Bearing Restrictions: No      Mobility Bed Mobility Overal bed mobility: Needs Assistance Bed Mobility: Sit to Supine, Supine to Sit     Supine to sit: Mod assist Sit to supine: Max assist   General bed mobility comments: Attempted to have her log roll, but she found that to be too painful        Balance Overall balance assessment: History of Falls           ADL either performed or assessed with clinical judgement   ADL Overall ADL's : Needs assistance/impaired Eating/Feeding: Set up;Sitting   Grooming: Set up;Sitting   Upper Body Bathing: Minimal assistance;Sitting   Lower Body Bathing: Total assistance;Sitting/lateral leans   Upper Body Dressing : Minimal assistance;Sitting   Lower Body Dressing: Sitting/lateral leans;Total assistance Lower Body Dressing Details (indicate cue type and reason): unable to reach BLE, unable to participate in figure four positioning. Toilet Transfer: Moderate assistance;Ambulation;Rolling walker (2 wheels) Toilet Transfer Details (indicate cue type and reason): to take few steps up to head of bed with RW with increased time and noted tears with pain. patient was motivated to participate and wants to get better. nurse made aware of increased pain with movement. Toileting- Clothing Manipulation and Hygiene: Total assistance;Sit to/from stand Toileting - Clothing Manipulation Details (indicate cue type and reason): needs BUE suppot to maintain standing balance.             Vision  Vision Assessment?: No apparent visual deficits            Pertinent Vitals/Pain Pain Assessment Pain Assessment:  Faces Faces Pain Scale: Hurts whole lot Pain Location: R mid back (flank) Pain Descriptors / Indicators: Grimacing, Moaning Pain Intervention(s): Limited activity within patient's tolerance, Monitored during session, Premedicated before session, Repositioned, Patient requesting pain meds-RN notified     Hand Dominance Right   Extremity/Trunk Assessment Upper Extremity Assessment Upper Extremity Assessment: Generalized weakness (limited with pain in back/flank impacting participation in tasks.)   Lower Extremity Assessment Lower Extremity Assessment: Defer to PT evaluation   Cervical / Trunk Assessment Cervical / Trunk Assessment: Other exceptions Cervical / Trunk Exceptions: pain in low back and hx of back surgery   Communication Communication Communication: Expressive difficulties   Cognition Arousal/Alertness: Awake/alert Behavior During Therapy: WFL for tasks assessed/performed Overall Cognitive Status: Within Functional Limits for tasks assessed         General Comments: plesant lady who was motivated to participate even with increased pain.                Home Living Family/patient expects to be discharged to:: Skilled nursing facility Living Arrangements: Alone     Additional Comments: patient lives at home alone with caregiver 3x a week. patient makes her own simple meals. patient has PRN support from daughter. patient fell while at gas station getting her car full of gas for the pending bad weather.      Prior Functioning/Environment Prior Level of Function : Driving;History of Falls (last six months)             OT Problem List: Impaired balance (sitting and/or standing);Decreased safety awareness;Decreased coordination;Decreased knowledge of precautions;Decreased knowledge of use of DME or AE;Cardiopulmonary status limiting activity;Pain;Decreased activity tolerance      OT Treatment/Interventions: Self-care/ADL training;Energy  conservation;Therapeutic exercise;DME and/or AE instruction;Therapeutic activities;Patient/family education;Balance training    OT Goals(Current goals can be found in the care plan section) Acute Rehab OT Goals Patient Stated Goal: to get pain under control OT Goal Formulation: With patient Time For Goal Achievement: 01/24/22 Potential to Achieve Goals: Fair  OT Frequency: Min 2X/week       AM-PAC OT "6 Clicks" Daily Activity     Outcome Measure Help from another person eating meals?: A Little Help from another person taking care of personal grooming?: A Little Help from another person toileting, which includes using toliet, bedpan, or urinal?: A Lot Help from another person bathing (including washing, rinsing, drying)?: A Lot Help from another person to put on and taking off regular upper body clothing?: A Little Help from another person to put on and taking off regular lower body clothing?: A Lot 6 Click Score: 15   End of Session Equipment Utilized During Treatment: Gait belt;Rolling walker (2 wheels) Nurse Communication: Patient requests pain meds;Other (comment) (increased pain and patients request for constipation medications)  Activity Tolerance: Patient limited by pain;Patient limited by lethargy Patient left: in bed;with call bell/phone within reach;with bed alarm set  OT Visit Diagnosis: Unsteadiness on feet (R26.81);Other abnormalities of gait and mobility (R26.89);Muscle weakness (generalized) (M62.81);Pain Pain - Right/Left: Right Pain - part of body:  (mid flank posterior)                Time: 7026-3785 OT Time Calculation (min): 48 min Charges:  OT General Charges $OT Visit: 1 Visit OT Evaluation $OT Eval Moderate Complexity: 1 Mod OT Treatments $Self Care/Home Management : 23-37 mins  Ysabel Stankovich OTR/L, MS  Acute Rehabilitation Department Office# [phone]   Willa Rough 01/10/2022, 4:19 PM

## 2022-01-11 LAB — URINALYSIS, ROUTINE W REFLEX MICROSCOPIC
Bilirubin Urine: NEGATIVE
Glucose, UA: NEGATIVE mg/dL
Hgb urine dipstick: NEGATIVE
Ketones, ur: NEGATIVE mg/dL
Leukocytes,Ua: NEGATIVE
Nitrite: NEGATIVE
Protein, ur: NEGATIVE mg/dL
Specific Gravity, Urine: 1.005 (ref 1.005–1.030)
pH: 6 (ref 5.0–8.0)

## 2022-01-11 LAB — CBG MONITORING, ED
Glucose-Capillary: 128 mg/dL — ABNORMAL HIGH (ref 70–99)
Glucose-Capillary: 166 mg/dL — ABNORMAL HIGH (ref 70–99)
Glucose-Capillary: 222 mg/dL — ABNORMAL HIGH (ref 70–99)
Glucose-Capillary: 202 mg/dL — ABNORMAL HIGH (ref 70–99)

## 2022-01-11 LAB — SARS CORONAVIRUS 2 BY RT PCR: SARS Coronavirus 2 by RT PCR: NEGATIVE

## 2022-01-11 MED ORDER — HYDROCODONE-ACETAMINOPHEN 5-325 MG PO TABS
1.0000 | ORAL_TABLET | Freq: Once | ORAL | Status: AC
  Administered 2022-01-11: 1 via ORAL

## 2022-01-11 MED ORDER — PHENAZOPYRIDINE HCL 100 MG PO TABS
100.0000 mg | ORAL_TABLET | Freq: Three times a day (TID) | ORAL | Status: DC
  Administered 2022-01-11 – 2022-01-12 (×4): 100 mg via ORAL
  Filled 2022-01-11 (×4): qty 1

## 2022-01-11 MED ORDER — DOCUSATE SODIUM 100 MG PO CAPS
100.0000 mg | ORAL_CAPSULE | Freq: Two times a day (BID) | ORAL | Status: DC | PRN
  Administered 2022-01-11 (×2): 100 mg via ORAL
  Filled 2022-01-11 (×2): qty 1

## 2022-01-11 NOTE — ED Provider Notes (Signed)
Emergency Medicine Observation Re-evaluation Note  [name] is a 82 y.o. female, seen on rounds today.  Pt initially presented to the ED for complaints of Fall Currently, the patient is resting comfortably.  Physical Exam  BP (!) 145/68   Pulse 77   Temp 97.7 F (36.5 C)   Resp 19   Ht '5\' 4"'$  (1.626 m)   Wt 108.9 kg   SpO2 94%   BMI 41.20 kg/m  Physical Exam General: NAD   ED Course / MDM  EKG:EKG Interpretation  Date/Time:  Friday January 08 2022 18:24:55 EST Ventricular Rate:  60 PR Interval:  218 QRS Duration: 100 QT Interval:  415 QTC Calculation: 415 R Axis:   1 Text Interpretation: Sinus rhythm Borderline prolonged PR interval No significant change since prior 7/22 Confirmed by Aletta Edouard [phone]) on 01/08/2022 6:29:19 PM  I have reviewed the labs performed to date as well as medications administered while in observation.  Recent changes in the last 24 hours include no acute event reported.  Plan  Current plan is for placement. [facility] 1/8?    Valarie Merino, MD 01/11/22 [phone]

## 2022-01-11 NOTE — ED Notes (Signed)
Per pt request, assisted pt w/ personal hygiene. Pt requires max assist. Cleaned pt and provided pericare. Provided a new female purewick and brief. Pt given a new gown. Adjusted pt in bed. Pt comfortable. Call bell in reach. Pt request pain med. RN aware

## 2022-01-11 NOTE — Progress Notes (Signed)
8:22 pm AUTH still pending, TOC will continue to follow.

## 2022-01-11 NOTE — Progress Notes (Addendum)
This CSW requested EDP order a covid test before pt's d/c to [facility]. Per EDP, it has been ordered.   Addend @ 10:42 AM Pt's Auth is still pending.

## 2022-01-11 NOTE — ED Notes (Signed)
Pt sleeping. Will hold off dinner tray and CBG. RN aware

## 2022-01-11 NOTE — ED Notes (Signed)
Patient alert this shift. Medication compliant. Confusion noted at times.  Cooperative with care. Patient needs assist with ADLs.

## 2022-01-11 NOTE — ED Notes (Signed)
Pt resting att. Will hold off CBG and lunch tray. RN aware

## 2022-01-11 NOTE — Progress Notes (Signed)
AUTH still pending as of 6:46 pm

## 2022-01-11 NOTE — Progress Notes (Signed)
CSW has looked at this patient, as the [name] is still pending.

## 2022-01-11 NOTE — ED Notes (Signed)
Pt required +2 assist to transfer from bed to [facility]. Noted pt had large bm. This Probation officer provided [employer]. After having BM, pt c/o "feeling very dizzy". Pt required multiple verbal cues to stay awake. Pt transferred back to bed w/ +2 assist. VS obtained. RN aware

## 2022-01-12 ENCOUNTER — Emergency Department (HOSPITAL_BASED_OUTPATIENT_CLINIC_OR_DEPARTMENT_OTHER)
Admit: 2022-01-12 | Discharge: 2022-01-12 | Disposition: A | Discharge status: Home / Self Care | Payer: Medicare Other | Attending: Emergency Medicine | Admitting: Emergency Medicine

## 2022-01-12 ENCOUNTER — Ambulatory Visit (INDEPENDENT_AMBULATORY_CARE_PROVIDER_SITE_OTHER): Payer: Medicare Other | Admitting: Podiatry

## 2022-01-12 DIAGNOSIS — E039 Hypothyroidism, unspecified: Secondary | ICD-10-CM | POA: Diagnosis not present

## 2022-01-12 DIAGNOSIS — R2681 Unsteadiness on feet: Secondary | ICD-10-CM | POA: Diagnosis not present

## 2022-01-12 DIAGNOSIS — E611 Iron deficiency: Secondary | ICD-10-CM | POA: Diagnosis not present

## 2022-01-12 DIAGNOSIS — S8011XA Contusion of right lower leg, initial encounter: Secondary | ICD-10-CM | POA: Diagnosis not present

## 2022-01-12 DIAGNOSIS — E785 Hyperlipidemia, unspecified: Secondary | ICD-10-CM | POA: Diagnosis not present

## 2022-01-12 DIAGNOSIS — E782 Mixed hyperlipidemia: Secondary | ICD-10-CM | POA: Diagnosis not present

## 2022-01-12 DIAGNOSIS — S301XXA Contusion of abdominal wall, initial encounter: Secondary | ICD-10-CM | POA: Diagnosis not present

## 2022-01-12 DIAGNOSIS — G63 Polyneuropathy in diseases classified elsewhere: Secondary | ICD-10-CM | POA: Diagnosis not present

## 2022-01-12 DIAGNOSIS — R296 Repeated falls: Secondary | ICD-10-CM | POA: Diagnosis not present

## 2022-01-12 DIAGNOSIS — G250 Essential tremor: Secondary | ICD-10-CM | POA: Diagnosis not present

## 2022-01-12 DIAGNOSIS — M109 Gout, unspecified: Secondary | ICD-10-CM | POA: Diagnosis not present

## 2022-01-12 DIAGNOSIS — R531 Weakness: Secondary | ICD-10-CM | POA: Diagnosis not present

## 2022-01-12 DIAGNOSIS — S300XXA Contusion of lower back and pelvis, initial encounter: Secondary | ICD-10-CM | POA: Diagnosis not present

## 2022-01-12 DIAGNOSIS — E1165 Type 2 diabetes mellitus with hyperglycemia: Secondary | ICD-10-CM | POA: Diagnosis not present

## 2022-01-12 DIAGNOSIS — K219 Gastro-esophageal reflux disease without esophagitis: Secondary | ICD-10-CM | POA: Diagnosis not present

## 2022-01-12 DIAGNOSIS — Z743 Need for continuous supervision: Secondary | ICD-10-CM | POA: Diagnosis not present

## 2022-01-12 DIAGNOSIS — I10 Essential (primary) hypertension: Secondary | ICD-10-CM | POA: Diagnosis not present

## 2022-01-12 DIAGNOSIS — R52 Pain, unspecified: Secondary | ICD-10-CM

## 2022-01-12 DIAGNOSIS — M79604 Pain in right leg: Secondary | ICD-10-CM | POA: Diagnosis not present

## 2022-01-12 DIAGNOSIS — Z7982 Long term (current) use of aspirin: Secondary | ICD-10-CM | POA: Diagnosis not present

## 2022-01-12 DIAGNOSIS — R233 Spontaneous ecchymoses: Secondary | ICD-10-CM | POA: Diagnosis not present

## 2022-01-12 DIAGNOSIS — E1122 Type 2 diabetes mellitus with diabetic chronic kidney disease: Secondary | ICD-10-CM | POA: Diagnosis not present

## 2022-01-12 DIAGNOSIS — Z794 Long term (current) use of insulin: Secondary | ICD-10-CM | POA: Diagnosis not present

## 2022-01-12 DIAGNOSIS — J45909 Unspecified asthma, uncomplicated: Secondary | ICD-10-CM | POA: Diagnosis not present

## 2022-01-12 DIAGNOSIS — W19XXXD Unspecified fall, subsequent encounter: Secondary | ICD-10-CM | POA: Diagnosis not present

## 2022-01-12 DIAGNOSIS — R262 Difficulty in walking, not elsewhere classified: Secondary | ICD-10-CM | POA: Diagnosis not present

## 2022-01-12 DIAGNOSIS — M6281 Muscle weakness (generalized): Secondary | ICD-10-CM | POA: Diagnosis not present

## 2022-01-12 DIAGNOSIS — I700 Atherosclerosis of aorta: Secondary | ICD-10-CM | POA: Diagnosis not present

## 2022-01-12 DIAGNOSIS — R278 Other lack of coordination: Secondary | ICD-10-CM | POA: Diagnosis not present

## 2022-01-12 DIAGNOSIS — S7001XA Contusion of right hip, initial encounter: Secondary | ICD-10-CM | POA: Diagnosis not present

## 2022-01-12 DIAGNOSIS — Z1152 Encounter for screening for COVID-19: Secondary | ICD-10-CM | POA: Diagnosis not present

## 2022-01-12 DIAGNOSIS — K5900 Constipation, unspecified: Secondary | ICD-10-CM | POA: Diagnosis not present

## 2022-01-12 DIAGNOSIS — M1990 Unspecified osteoarthritis, unspecified site: Secondary | ICD-10-CM | POA: Diagnosis not present

## 2022-01-12 DIAGNOSIS — D72829 Elevated white blood cell count, unspecified: Secondary | ICD-10-CM | POA: Diagnosis not present

## 2022-01-12 DIAGNOSIS — W19XXXA Unspecified fall, initial encounter: Secondary | ICD-10-CM | POA: Diagnosis not present

## 2022-01-12 DIAGNOSIS — Z79899 Other long term (current) drug therapy: Secondary | ICD-10-CM | POA: Diagnosis not present

## 2022-01-12 DIAGNOSIS — G8929 Other chronic pain: Secondary | ICD-10-CM | POA: Diagnosis not present

## 2022-01-12 DIAGNOSIS — G894 Chronic pain syndrome: Secondary | ICD-10-CM | POA: Diagnosis not present

## 2022-01-12 DIAGNOSIS — E119 Type 2 diabetes mellitus without complications: Secondary | ICD-10-CM | POA: Diagnosis not present

## 2022-01-12 DIAGNOSIS — Z91199 Patient's noncompliance with other medical treatment and regimen due to unspecified reason: Secondary | ICD-10-CM

## 2022-01-12 LAB — CBG MONITORING, ED
Glucose-Capillary: 175 mg/dL — ABNORMAL HIGH (ref 70–99)
Glucose-Capillary: 163 mg/dL — ABNORMAL HIGH (ref 70–99)

## 2022-01-12 MED ORDER — ACETAMINOPHEN 325 MG PO TABS
650.0000 mg | ORAL_TABLET | Freq: Once | ORAL | Status: AC
  Administered 2022-01-12: 650 mg via ORAL
  Filled 2022-01-12: qty 2

## 2022-01-12 MED ORDER — LIDOCAINE 5 % EX PTCH
1.0000 | MEDICATED_PATCH | CUTANEOUS | Status: DC
  Administered 2022-01-12: 1 via TRANSDERMAL
  Filled 2022-01-12 (×2): qty 1

## 2022-01-12 NOTE — Progress Notes (Signed)
Right lower extremity venous duplex has been completed. Preliminary results can be found in CV Proc through chart review.  Results were given to Dr. Armandina Gemma.  01/12/22 11:05 AM [name] RVT

## 2022-01-12 NOTE — ED Provider Notes (Addendum)
Emergency Medicine Observation Re-evaluation Note  [name] is a 82 y.o. female, seen on rounds today.  Pt initially presented to the ED for complaints of Fall Currently, the patient is resting comfortably.  Physical Exam  BP (!) 147/53 (BP Location: Right Arm)   Pulse 62   Temp 98.8 F (37.1 C) (Oral)   Resp 18   Ht '5\' 4"'$  (1.626 m)   Wt 108.9 kg   SpO2 97%   BMI 41.20 kg/m  Physical Exam General: NAD Abd: Soft, NT, ND, no rebound or guarding MSK: soft tissue tenderness along the patient's right flank Ext: Tenderness of the right calf  ED Course / MDM  EKG:EKG Interpretation  Date/Time:  Friday January 08 2022 18:24:55 EST Ventricular Rate:  60 PR Interval:  218 QRS Duration: 100 QT Interval:  415 QTC Calculation: 415 R Axis:   1 Text Interpretation: Sinus rhythm Borderline prolonged PR interval No significant change since prior 7/22 Confirmed by Aletta Edouard [phone]) on 01/08/2022 6:29:19 PM  I have reviewed the labs performed to date as well as medications administered while in observation.  Recent changes in the last 24 hours include low back pain. On exam, the patient's abdomen is soft, non-tender, non-distended. She has musculoskeletal right flank pain which has been present since her fall. Her CT imaging was negative. Suspect MSK strain. Will apply lidocaine patch on the right. She also complains of new pain and swelling in her right posterior calf. Will obtain DVT US of the RLE.   DVT US negative for DVT. Pt remains medically clear.  Plan  Current plan is for placement.         Regan Lemming, MD 01/12/22 [id number]    Regan Lemming, MD 01/12/22 1054

## 2022-01-12 NOTE — ED Provider Notes (Signed)
See same-day note from prior provider.  I was informed that patient has been accepted at a local skilled nursing facility will plan to transport to that facility at this time.   Tretha Sciara, MD 01/12/22 [phone]

## 2022-01-12 NOTE — Progress Notes (Signed)
CSW was just informed that the patient has been DC to the facility.

## 2022-01-12 NOTE — Progress Notes (Addendum)
CSW spoke to the patient's daughter about the patient leaving to go to the facility. The daughter expressed that she did not want to be responsible for any finance after the 20 days. The daughter wanted to know if the mom could sign the paper work her self. Janie from the facility stated that if the patient was alert and oriented  that that patient could do so. Per leadership Narda Rutherford would need to explain the paper work to the patient. CSW took the patient's paper work down to the patient's room to have janie explain. The patient did understand and was able to sign the paper work. The CSW than fax and scanned the paper work over back to [place]. CSW also called [facility] as the patient will leave tonight.  Daughter's are aware. TOC will continue to follow.   6:29pm CSW re-notified provider of DC, Awaiting [facility] currently at this moment bed # and report was given to the patient's nurse.

## 2022-01-12 NOTE — ED Notes (Signed)
Patient reports that pain 10/10 to back turned to left sided and wedged.

## 2022-01-12 NOTE — Progress Notes (Signed)
Physical Therapy Treatment Patient Details Name: [name] MRN: [id number] DOB: 1940-02-16 Today's Date: 01/12/2022   History of Present Illness [name] is a 82 y.o. female who presents to the ED who presents to the ED secondary to a fall at a gas station. Imaging tests are negative for acute injury.PMH: DMII, CVA,    PT Comments    Pt pleasant, very motivated to mobilize and get OOB. Pt needing increased time and cues to get to EOB, limited by R knee pain. Once up, vascular arrived so assisted pt back to supine. Pt very upset, wanting to participate in therapy. Will continue acute efforts as able.    Recommendations for follow up therapy are one component of a multi-disciplinary discharge planning process, led by the attending physician.  Recommendations may be updated based on patient status, additional functional criteria and insurance authorization.  Follow Up Recommendations  Skilled nursing-short term rehab (<3 hours/day) Can patient physically be transported by private vehicle: No   Assistance Recommended at Discharge Intermittent Supervision/Assistance  Patient can return home with the following A little help with walking and/or transfers;A little help with bathing/dressing/bathroom   Equipment Recommendations  None recommended by PT    Recommendations for Other Services       Precautions / Restrictions Precautions Precautions: Fall Restrictions Weight Bearing Restrictions: No     Mobility  Bed Mobility Overal bed mobility: Needs Assistance Bed Mobility: Supine to Sit, Sit to Supine  Supine to sit: Mod assist Sit to supine: Min assist  General bed mobility comments: cues for sequencing and hand placement with mobility, mod A with significant increased time to come to sitting, vascular enterred room for [place] so pt returns to supine, min A for BLE management    Transfers   Ambulation/Gait    Stairs             Wheelchair Mobility    Modified  Rankin (Stroke Patients Only)       Balance                   Cognition Arousal/Alertness: Awake/alert Behavior During Therapy: WFL for tasks assessed/performed Overall Cognitive Status: Within Functional Limits for tasks assessed    General Comments: pt pleasant, very motivated to mobilize        Exercises      General Comments        Pertinent Vitals/Pain Pain Assessment Pain Assessment: Faces Faces Pain Scale: Hurts little more Pain Location: R knee Pain Descriptors / Indicators: Tender Pain Intervention(s): Limited activity within patient's tolerance, Monitored during session    Home Living                          Prior Function            PT Goals (current goals can now be found in the care plan section) Acute Rehab PT Goals Patient Stated Goal: Go to rehab PT Goal Formulation: With patient Time For Goal Achievement: 01/23/22 Potential to Achieve Goals: Good Progress towards PT goals: Progressing toward goals    Frequency    Min 2X/week      PT Plan Current plan remains appropriate    Co-evaluation              AM-PAC PT "6 Clicks" Mobility   Outcome Measure  Help needed turning from your back to your side while in a flat bed without using bedrails?: A Lot Help needed moving  from lying on your back to sitting on the side of a flat bed without using bedrails?: A Lot Help needed moving to and from a bed to a chair (including a wheelchair)?: A Little Help needed standing up from a chair using your arms (e.g., wheelchair or bedside chair)?: A Lot Help needed to walk in hospital room?: A Lot Help needed climbing 3-5 steps with a railing? : Total 6 Click Score: 12    End of Session   Activity Tolerance: Patient limited by pain;Other (comment) (needing [place]) Patient left: in bed;Other (comment) (vascular in room for [place]) Nurse Communication: Mobility status PT Visit Diagnosis: Pain;Other abnormalities of gait and mobility  (R26.89);Difficulty in walking, not elsewhere classified (R26.2) Pain - Right/Left: Right Pain - part of body:  (low back)     Time: 1012-1020 PT Time Calculation (min) (ACUTE ONLY): 8 min  Charges:  $Therapeutic Activity: 8-22 mins                      Tori Brailey Buescher PT, DPT 01/12/22, 11:07 AM

## 2022-01-12 NOTE — Progress Notes (Addendum)
Pt's Josem Kaufmann was approved. TOC to coordinate d/c.  Addend @ 1:05 PM This CSW attempted to contact [place], admissions rep at [facility].   Addend @ 1:38 PM This CSW contacted [place] to inquire about signing paperwork. She advised she is in [place] and to try reaching [place]. This CSW attempted to contact [place]. Left HIPAA Compliant voicemail.  Addend @ 2:54 PM This CSW called [place] to inform that I've been unable to contact [place]. Sharman Crate states she will try to reach her and if she cannot, she will complete paperwork. [place] notified.

## 2022-01-12 NOTE — Progress Notes (Signed)
This CSW noted that [facility] requested latest MD/PA note. This CSW uploaded the latest 2 evaluation notes signed by MDs. TOC following.

## 2022-01-12 NOTE — Progress Notes (Signed)
1. No-show for appointment     

## 2022-01-12 NOTE — Progress Notes (Signed)
Physical Therapy Treatment Patient Details Name: [name] MRN: [id number] DOB: [birth year] Today's Date: 01/12/2022   History of Present Illness [name] is a 82 y.o. female who presents to the ED who presents to the ED secondary to a fall at a gas station. Imaging tests are negative for acute injury.PMH: DMII, CVA,    PT Comments    Pt needing increased time to come to sitting EOB, min A to fully mobilize BLE. Pt powers to stand and takes step over to bedside recliner with mod A+2 for safety due to fall history and fear of falling. Cues for hand placement and sequencing with transfers, pt also needing assist to maneuver RW appropriately. Once in recliner, pt tolerates BLE strengthening exercises with equal BLE strength and ROM, but increased time to complete exercises on RLE. Pt endorses some R hip pain - notified RN.   Recommendations for follow up therapy are one component of a multi-disciplinary discharge planning process, led by the attending physician.  Recommendations may be updated based on patient status, additional functional criteria and insurance authorization.  Follow Up Recommendations  Skilled nursing-short term rehab (<3 hours/day) Can patient physically be transported by private vehicle: No   Assistance Recommended at Discharge Intermittent Supervision/Assistance  Patient can return home with the following A little help with bathing/dressing/bathroom;Assistance with cooking/housework;A lot of help with walking and/or transfers   Equipment Recommendations  None recommended by PT    Recommendations for Other Services       Precautions / Restrictions Precautions Precautions: Fall Restrictions Weight Bearing Restrictions: No     Mobility  Bed Mobility Overal bed mobility: Needs Assistance Bed Mobility: Supine to Sit  Supine to sit: Min assist Sit to supine: Min assist  General bed mobility comments: increased time, min A to mobilize BLE to EOB and  scoot out to EOB    Transfers Overall transfer level: Needs assistance Equipment used: Rolling walker (2 wheels) Transfers: Sit to/from Stand, Bed to chair/wheelchair/BSC Sit to Stand: Mod assist, +2 safety/equipment  Step pivot transfers: Mod assist, +2 safety/equipment  General transfer comment: cues for hand placement, mod A+2 for safety to power to stand, shift weight anterior, assist to manage RW with step pivot to recliner at bedside, 2+ for safety due to pt's fear of falling and history of falling    Ambulation/Gait  General Gait Details: pt able to perform standing marching and step to recliner, declines further ambulation due to pain   Stairs             Wheelchair Mobility    Modified Rankin (Stroke Patients Only)       Balance Overall balance assessment: History of Falls, Needs assistance Sitting-balance support: Feet supported Sitting balance-Leahy Scale: Good     Standing balance support: Bilateral upper extremity supported, Reliant on assistive device for balance, During functional activity Standing balance-Leahy Scale: Poor     Cognition Arousal/Alertness: Awake/alert Behavior During Therapy: WFL for tasks assessed/performed Overall Cognitive Status: Within Functional Limits for tasks assessed  General Comments: pt pleasant, very motivated to mobilize        Exercises General Exercises - Lower Extremity Long Arc Quad: Seated, AROM, Strengthening, Both, 10 reps Hip Flexion/Marching: Seated, AROM, Strengthening, Both, 10 reps    General Comments        Pertinent Vitals/Pain Pain Assessment Pain Assessment: Faces Faces Pain Scale: Hurts even more Pain Location: R hip Pain Descriptors / Indicators: Discomfort, Grimacing, Tender Pain Intervention(s): Limited activity within patient's tolerance,  Monitored during session    Home Living                          Prior Function            PT Goals (current goals can now be found  in the care plan section) Acute Rehab PT Goals Patient Stated Goal: Go to rehab PT Goal Formulation: With patient Time For Goal Achievement: 01/23/22 Potential to Achieve Goals: Good Progress towards PT goals: Progressing toward goals    Frequency    Min 2X/week      PT Plan Current plan remains appropriate    Co-evaluation              AM-PAC PT "6 Clicks" Mobility   Outcome Measure  Help needed turning from your back to your side while in a flat bed without using bedrails?: A Lot Help needed moving from lying on your back to sitting on the side of a flat bed without using bedrails?: A Lot Help needed moving to and from a bed to a chair (including a wheelchair)?: A Lot Help needed standing up from a chair using your arms (e.g., wheelchair or bedside chair)?: A Lot Help needed to walk in hospital room?: A Lot Help needed climbing 3-5 steps with a railing? : Total 6 Click Score: 11    End of Session Equipment Utilized During Treatment: Gait belt Activity Tolerance: Patient limited by pain Patient left: in chair;with call bell/phone within reach;with nursing/sitter in room (vascular in room for [place]) Nurse Communication: Mobility status PT Visit Diagnosis: Pain;Other abnormalities of gait and mobility (R26.89);Difficulty in walking, not elsewhere classified (R26.2) Pain - Right/Left: Right Pain - part of body:  (low back)     Time: 1340-1408 PT Time Calculation (min) (ACUTE ONLY): 28 min  Charges:  $Therapeutic Exercise: 8-22 mins $Therapeutic Activity: 8-22 mins                      Tori Ryler Laskowski PT, DPT 01/12/22, 2:18 PM

## 2022-01-13 DIAGNOSIS — J45909 Unspecified asthma, uncomplicated: Secondary | ICD-10-CM | POA: Diagnosis not present

## 2022-01-13 DIAGNOSIS — I10 Essential (primary) hypertension: Secondary | ICD-10-CM | POA: Diagnosis not present

## 2022-01-13 DIAGNOSIS — G894 Chronic pain syndrome: Secondary | ICD-10-CM | POA: Diagnosis not present

## 2022-01-13 DIAGNOSIS — G63 Polyneuropathy in diseases classified elsewhere: Secondary | ICD-10-CM | POA: Diagnosis not present

## 2022-01-14 DIAGNOSIS — E1122 Type 2 diabetes mellitus with diabetic chronic kidney disease: Secondary | ICD-10-CM | POA: Diagnosis not present

## 2022-01-14 DIAGNOSIS — I10 Essential (primary) hypertension: Secondary | ICD-10-CM | POA: Diagnosis not present

## 2022-01-14 DIAGNOSIS — E782 Mixed hyperlipidemia: Secondary | ICD-10-CM | POA: Diagnosis not present

## 2022-01-14 DIAGNOSIS — E611 Iron deficiency: Secondary | ICD-10-CM | POA: Diagnosis not present

## 2022-01-14 DIAGNOSIS — E039 Hypothyroidism, unspecified: Secondary | ICD-10-CM | POA: Diagnosis not present

## 2022-01-14 DIAGNOSIS — J45909 Unspecified asthma, uncomplicated: Secondary | ICD-10-CM | POA: Diagnosis not present

## 2022-01-14 DIAGNOSIS — G894 Chronic pain syndrome: Secondary | ICD-10-CM | POA: Diagnosis not present

## 2022-01-15 DIAGNOSIS — I10 Essential (primary) hypertension: Secondary | ICD-10-CM | POA: Diagnosis not present

## 2022-01-15 DIAGNOSIS — E1165 Type 2 diabetes mellitus with hyperglycemia: Secondary | ICD-10-CM | POA: Diagnosis not present

## 2022-01-15 DIAGNOSIS — E785 Hyperlipidemia, unspecified: Secondary | ICD-10-CM | POA: Diagnosis not present

## 2022-01-16 DIAGNOSIS — E611 Iron deficiency: Secondary | ICD-10-CM | POA: Diagnosis not present

## 2022-01-16 DIAGNOSIS — E039 Hypothyroidism, unspecified: Secondary | ICD-10-CM | POA: Diagnosis not present

## 2022-01-16 DIAGNOSIS — K5900 Constipation, unspecified: Secondary | ICD-10-CM | POA: Diagnosis not present

## 2022-01-16 DIAGNOSIS — K219 Gastro-esophageal reflux disease without esophagitis: Secondary | ICD-10-CM | POA: Diagnosis not present

## 2022-01-16 DIAGNOSIS — E1122 Type 2 diabetes mellitus with diabetic chronic kidney disease: Secondary | ICD-10-CM | POA: Diagnosis not present

## 2022-01-16 DIAGNOSIS — G63 Polyneuropathy in diseases classified elsewhere: Secondary | ICD-10-CM | POA: Diagnosis not present

## 2022-01-16 DIAGNOSIS — E782 Mixed hyperlipidemia: Secondary | ICD-10-CM | POA: Diagnosis not present

## 2022-01-16 DIAGNOSIS — G250 Essential tremor: Secondary | ICD-10-CM | POA: Diagnosis not present

## 2022-01-16 DIAGNOSIS — J45909 Unspecified asthma, uncomplicated: Secondary | ICD-10-CM | POA: Diagnosis not present

## 2022-01-16 DIAGNOSIS — G894 Chronic pain syndrome: Secondary | ICD-10-CM | POA: Diagnosis not present

## 2022-01-16 DIAGNOSIS — M109 Gout, unspecified: Secondary | ICD-10-CM | POA: Diagnosis not present

## 2022-01-16 DIAGNOSIS — I10 Essential (primary) hypertension: Secondary | ICD-10-CM | POA: Diagnosis not present

## 2022-01-19 DIAGNOSIS — I10 Essential (primary) hypertension: Secondary | ICD-10-CM | POA: Diagnosis not present

## 2022-01-19 DIAGNOSIS — G894 Chronic pain syndrome: Secondary | ICD-10-CM | POA: Diagnosis not present

## 2022-01-19 DIAGNOSIS — J45909 Unspecified asthma, uncomplicated: Secondary | ICD-10-CM | POA: Diagnosis not present

## 2022-01-19 DIAGNOSIS — M1990 Unspecified osteoarthritis, unspecified site: Secondary | ICD-10-CM | POA: Diagnosis not present

## 2022-01-20 DIAGNOSIS — J45909 Unspecified asthma, uncomplicated: Secondary | ICD-10-CM | POA: Diagnosis not present

## 2022-01-20 DIAGNOSIS — E1122 Type 2 diabetes mellitus with diabetic chronic kidney disease: Secondary | ICD-10-CM | POA: Diagnosis not present

## 2022-01-20 DIAGNOSIS — G894 Chronic pain syndrome: Secondary | ICD-10-CM | POA: Diagnosis not present

## 2022-01-20 DIAGNOSIS — I10 Essential (primary) hypertension: Secondary | ICD-10-CM | POA: Diagnosis not present

## 2022-01-20 DIAGNOSIS — M1990 Unspecified osteoarthritis, unspecified site: Secondary | ICD-10-CM | POA: Diagnosis not present

## 2022-01-21 ENCOUNTER — Other Ambulatory Visit: Payer: Self-pay | Admitting: *Deleted

## 2022-01-21 NOTE — Patient Outreach (Signed)
Mrs. Straley resides in [facility] skilled nursing facility. Screening for potential care coordination services as benefit of insurance plan and Primary Care Provider.   Collaboration with Florentina Jenny, Scientist, forensic. Florentina Jenny reports Mrs. Gradel plan is to to return home upon SNF discharge.   PCP office [facility] has [facility] care management.   Will continue to follow.   Marthenia Rolling, MSN, RN,BSN [place] Acute Care Coordinator [phone] (Direct dial)

## 2022-01-23 DIAGNOSIS — R296 Repeated falls: Secondary | ICD-10-CM | POA: Diagnosis not present

## 2022-01-23 DIAGNOSIS — K219 Gastro-esophageal reflux disease without esophagitis: Secondary | ICD-10-CM | POA: Diagnosis not present

## 2022-01-23 DIAGNOSIS — G894 Chronic pain syndrome: Secondary | ICD-10-CM | POA: Diagnosis not present

## 2022-01-23 DIAGNOSIS — K5900 Constipation, unspecified: Secondary | ICD-10-CM | POA: Diagnosis not present

## 2022-01-23 DIAGNOSIS — G250 Essential tremor: Secondary | ICD-10-CM | POA: Diagnosis not present

## 2022-01-23 DIAGNOSIS — G63 Polyneuropathy in diseases classified elsewhere: Secondary | ICD-10-CM | POA: Diagnosis not present

## 2022-01-23 DIAGNOSIS — I10 Essential (primary) hypertension: Secondary | ICD-10-CM | POA: Diagnosis not present

## 2022-01-27 DIAGNOSIS — I10 Essential (primary) hypertension: Secondary | ICD-10-CM | POA: Diagnosis not present

## 2022-01-27 DIAGNOSIS — K219 Gastro-esophageal reflux disease without esophagitis: Secondary | ICD-10-CM | POA: Diagnosis not present

## 2022-01-27 DIAGNOSIS — G894 Chronic pain syndrome: Secondary | ICD-10-CM | POA: Diagnosis not present

## 2022-01-27 DIAGNOSIS — J45909 Unspecified asthma, uncomplicated: Secondary | ICD-10-CM | POA: Diagnosis not present

## 2022-01-29 DIAGNOSIS — R531 Weakness: Secondary | ICD-10-CM | POA: Diagnosis not present

## 2022-01-29 DIAGNOSIS — M6281 Muscle weakness (generalized): Secondary | ICD-10-CM | POA: Diagnosis not present

## 2022-01-29 DIAGNOSIS — R2689 Other abnormalities of gait and mobility: Secondary | ICD-10-CM | POA: Diagnosis not present

## 2022-01-29 DIAGNOSIS — Z8673 Personal history of transient ischemic attack (TIA), and cerebral infarction without residual deficits: Secondary | ICD-10-CM | POA: Diagnosis not present

## 2022-02-02 ENCOUNTER — Other Ambulatory Visit: Payer: Self-pay | Admitting: *Deleted

## 2022-02-02 NOTE — Patient Outreach (Signed)
[place] Coordinator follow up. Verified in [facility] Mrs. Dutt discharged from [facility] skilled nursing facility on 01/28/22.  PCP office [facility] has Upstream care management.   Will send secure notification to [facility] care management of discharge date.   Marthenia Rolling, MSN, RN,BSN [place] Acute Care Coordinator [phone] (Direct dial)

## 2022-02-10 ENCOUNTER — Ambulatory Visit: Payer: Medicare Other

## 2022-02-11 DIAGNOSIS — R5381 Other malaise: Secondary | ICD-10-CM | POA: Diagnosis not present

## 2022-02-11 DIAGNOSIS — E113293 Type 2 diabetes mellitus with mild nonproliferative diabetic retinopathy without macular edema, bilateral: Secondary | ICD-10-CM | POA: Diagnosis not present

## 2022-02-11 DIAGNOSIS — I700 Atherosclerosis of aorta: Secondary | ICD-10-CM | POA: Diagnosis not present

## 2022-02-11 DIAGNOSIS — Z79899 Other long term (current) drug therapy: Secondary | ICD-10-CM | POA: Diagnosis not present

## 2022-02-11 DIAGNOSIS — E7800 Pure hypercholesterolemia, unspecified: Secondary | ICD-10-CM | POA: Diagnosis not present

## 2022-02-11 DIAGNOSIS — Z794 Long term (current) use of insulin: Secondary | ICD-10-CM | POA: Diagnosis not present

## 2022-02-11 DIAGNOSIS — D649 Anemia, unspecified: Secondary | ICD-10-CM | POA: Diagnosis not present

## 2022-02-11 DIAGNOSIS — H35033 Hypertensive retinopathy, bilateral: Secondary | ICD-10-CM | POA: Diagnosis not present

## 2022-02-11 DIAGNOSIS — E1122 Type 2 diabetes mellitus with diabetic chronic kidney disease: Secondary | ICD-10-CM | POA: Diagnosis not present

## 2022-02-11 DIAGNOSIS — E890 Postprocedural hypothyroidism: Secondary | ICD-10-CM | POA: Diagnosis not present

## 2022-02-11 DIAGNOSIS — N1831 Chronic kidney disease, stage 3a: Secondary | ICD-10-CM | POA: Diagnosis not present

## 2022-02-11 DIAGNOSIS — E2089 Other specified hypoparathyroidism: Secondary | ICD-10-CM | POA: Diagnosis not present

## 2022-02-14 DIAGNOSIS — G8929 Other chronic pain: Secondary | ICD-10-CM | POA: Diagnosis not present

## 2022-02-14 DIAGNOSIS — Z794 Long term (current) use of insulin: Secondary | ICD-10-CM | POA: Diagnosis not present

## 2022-02-14 DIAGNOSIS — M48061 Spinal stenosis, lumbar region without neurogenic claudication: Secondary | ICD-10-CM | POA: Diagnosis not present

## 2022-02-14 DIAGNOSIS — N2581 Secondary hyperparathyroidism of renal origin: Secondary | ICD-10-CM | POA: Diagnosis not present

## 2022-02-14 DIAGNOSIS — M419 Scoliosis, unspecified: Secondary | ICD-10-CM | POA: Diagnosis not present

## 2022-02-14 DIAGNOSIS — H35033 Hypertensive retinopathy, bilateral: Secondary | ICD-10-CM | POA: Diagnosis not present

## 2022-02-14 DIAGNOSIS — N1831 Chronic kidney disease, stage 3a: Secondary | ICD-10-CM | POA: Diagnosis not present

## 2022-02-14 DIAGNOSIS — E113293 Type 2 diabetes mellitus with mild nonproliferative diabetic retinopathy without macular edema, bilateral: Secondary | ICD-10-CM | POA: Diagnosis not present

## 2022-02-14 DIAGNOSIS — M4802 Spinal stenosis, cervical region: Secondary | ICD-10-CM | POA: Diagnosis not present

## 2022-02-14 DIAGNOSIS — Z9181 History of falling: Secondary | ICD-10-CM | POA: Diagnosis not present

## 2022-02-14 DIAGNOSIS — I69351 Hemiplegia and hemiparesis following cerebral infarction affecting right dominant side: Secondary | ICD-10-CM | POA: Diagnosis not present

## 2022-02-14 DIAGNOSIS — R600 Localized edema: Secondary | ICD-10-CM | POA: Diagnosis not present

## 2022-02-14 DIAGNOSIS — E1122 Type 2 diabetes mellitus with diabetic chronic kidney disease: Secondary | ICD-10-CM | POA: Diagnosis not present

## 2022-02-14 DIAGNOSIS — B029 Zoster without complications: Secondary | ICD-10-CM | POA: Diagnosis not present

## 2022-02-14 DIAGNOSIS — M5135 Other intervertebral disc degeneration, thoracolumbar region: Secondary | ICD-10-CM | POA: Diagnosis not present

## 2022-02-14 DIAGNOSIS — M810 Age-related osteoporosis without current pathological fracture: Secondary | ICD-10-CM | POA: Diagnosis not present

## 2022-02-14 DIAGNOSIS — D72829 Elevated white blood cell count, unspecified: Secondary | ICD-10-CM | POA: Diagnosis not present

## 2022-02-14 DIAGNOSIS — E7800 Pure hypercholesterolemia, unspecified: Secondary | ICD-10-CM | POA: Diagnosis not present

## 2022-02-14 DIAGNOSIS — E890 Postprocedural hypothyroidism: Secondary | ICD-10-CM | POA: Diagnosis not present

## 2022-02-14 DIAGNOSIS — W19XXXD Unspecified fall, subsequent encounter: Secondary | ICD-10-CM | POA: Diagnosis not present

## 2022-02-14 DIAGNOSIS — I129 Hypertensive chronic kidney disease with stage 1 through stage 4 chronic kidney disease, or unspecified chronic kidney disease: Secondary | ICD-10-CM | POA: Diagnosis not present

## 2022-02-14 DIAGNOSIS — D631 Anemia in chronic kidney disease: Secondary | ICD-10-CM | POA: Diagnosis not present

## 2022-02-14 DIAGNOSIS — Z7982 Long term (current) use of aspirin: Secondary | ICD-10-CM | POA: Diagnosis not present

## 2022-02-14 DIAGNOSIS — I700 Atherosclerosis of aorta: Secondary | ICD-10-CM | POA: Diagnosis not present

## 2022-02-15 ENCOUNTER — Inpatient Hospital Stay: Payer: Medicare Other | Admitting: Oncology

## 2022-02-15 ENCOUNTER — Telehealth: Payer: Self-pay | Admitting: *Deleted

## 2022-02-15 ENCOUNTER — Inpatient Hospital Stay: Payer: Medicare Other | Attending: Nurse Practitioner

## 2022-02-15 DIAGNOSIS — D72829 Elevated white blood cell count, unspecified: Secondary | ICD-10-CM | POA: Insufficient documentation

## 2022-02-15 DIAGNOSIS — D649 Anemia, unspecified: Secondary | ICD-10-CM | POA: Insufficient documentation

## 2022-02-15 NOTE — Telephone Encounter (Signed)
Called patient to follow up on "no show" today. She reports she had been in hospital several days and then in SNF for 20 days. She does wish to reschedule. Scheduler notified.

## 2022-02-16 ENCOUNTER — Ambulatory Visit (INDEPENDENT_AMBULATORY_CARE_PROVIDER_SITE_OTHER): Payer: Medicare Other | Admitting: Podiatry

## 2022-02-16 DIAGNOSIS — Z91199 Patient's noncompliance with other medical treatment and regimen due to unspecified reason: Secondary | ICD-10-CM

## 2022-02-16 NOTE — Progress Notes (Signed)
No show

## 2022-02-17 DIAGNOSIS — N189 Chronic kidney disease, unspecified: Secondary | ICD-10-CM | POA: Diagnosis not present

## 2022-02-17 DIAGNOSIS — D631 Anemia in chronic kidney disease: Secondary | ICD-10-CM | POA: Diagnosis not present

## 2022-02-17 DIAGNOSIS — I129 Hypertensive chronic kidney disease with stage 1 through stage 4 chronic kidney disease, or unspecified chronic kidney disease: Secondary | ICD-10-CM | POA: Diagnosis not present

## 2022-02-17 DIAGNOSIS — E1142 Type 2 diabetes mellitus with diabetic polyneuropathy: Secondary | ICD-10-CM | POA: Diagnosis not present

## 2022-02-17 DIAGNOSIS — N1832 Chronic kidney disease, stage 3b: Secondary | ICD-10-CM | POA: Diagnosis not present

## 2022-02-17 DIAGNOSIS — N2581 Secondary hyperparathyroidism of renal origin: Secondary | ICD-10-CM | POA: Diagnosis not present

## 2022-02-19 DIAGNOSIS — Z9181 History of falling: Secondary | ICD-10-CM | POA: Diagnosis not present

## 2022-02-19 DIAGNOSIS — E113293 Type 2 diabetes mellitus with mild nonproliferative diabetic retinopathy without macular edema, bilateral: Secondary | ICD-10-CM | POA: Diagnosis not present

## 2022-02-19 DIAGNOSIS — D72829 Elevated white blood cell count, unspecified: Secondary | ICD-10-CM | POA: Diagnosis not present

## 2022-02-19 DIAGNOSIS — R600 Localized edema: Secondary | ICD-10-CM | POA: Diagnosis not present

## 2022-02-19 DIAGNOSIS — M810 Age-related osteoporosis without current pathological fracture: Secondary | ICD-10-CM | POA: Diagnosis not present

## 2022-02-19 DIAGNOSIS — N1831 Chronic kidney disease, stage 3a: Secondary | ICD-10-CM | POA: Diagnosis not present

## 2022-02-19 DIAGNOSIS — M419 Scoliosis, unspecified: Secondary | ICD-10-CM | POA: Diagnosis not present

## 2022-02-19 DIAGNOSIS — E7800 Pure hypercholesterolemia, unspecified: Secondary | ICD-10-CM | POA: Diagnosis not present

## 2022-02-19 DIAGNOSIS — H35033 Hypertensive retinopathy, bilateral: Secondary | ICD-10-CM | POA: Diagnosis not present

## 2022-02-19 DIAGNOSIS — D631 Anemia in chronic kidney disease: Secondary | ICD-10-CM | POA: Diagnosis not present

## 2022-02-19 DIAGNOSIS — W19XXXD Unspecified fall, subsequent encounter: Secondary | ICD-10-CM | POA: Diagnosis not present

## 2022-02-19 DIAGNOSIS — B029 Zoster without complications: Secondary | ICD-10-CM | POA: Diagnosis not present

## 2022-02-19 DIAGNOSIS — Z7982 Long term (current) use of aspirin: Secondary | ICD-10-CM | POA: Diagnosis not present

## 2022-02-19 DIAGNOSIS — E890 Postprocedural hypothyroidism: Secondary | ICD-10-CM | POA: Diagnosis not present

## 2022-02-19 DIAGNOSIS — N2581 Secondary hyperparathyroidism of renal origin: Secondary | ICD-10-CM | POA: Diagnosis not present

## 2022-02-19 DIAGNOSIS — M4802 Spinal stenosis, cervical region: Secondary | ICD-10-CM | POA: Diagnosis not present

## 2022-02-19 DIAGNOSIS — G8929 Other chronic pain: Secondary | ICD-10-CM | POA: Diagnosis not present

## 2022-02-19 DIAGNOSIS — E1122 Type 2 diabetes mellitus with diabetic chronic kidney disease: Secondary | ICD-10-CM | POA: Diagnosis not present

## 2022-02-19 DIAGNOSIS — M48061 Spinal stenosis, lumbar region without neurogenic claudication: Secondary | ICD-10-CM | POA: Diagnosis not present

## 2022-02-19 DIAGNOSIS — I69351 Hemiplegia and hemiparesis following cerebral infarction affecting right dominant side: Secondary | ICD-10-CM | POA: Diagnosis not present

## 2022-02-19 DIAGNOSIS — Z794 Long term (current) use of insulin: Secondary | ICD-10-CM | POA: Diagnosis not present

## 2022-02-19 DIAGNOSIS — I700 Atherosclerosis of aorta: Secondary | ICD-10-CM | POA: Diagnosis not present

## 2022-02-19 DIAGNOSIS — I129 Hypertensive chronic kidney disease with stage 1 through stage 4 chronic kidney disease, or unspecified chronic kidney disease: Secondary | ICD-10-CM | POA: Diagnosis not present

## 2022-02-19 DIAGNOSIS — M5135 Other intervertebral disc degeneration, thoracolumbar region: Secondary | ICD-10-CM | POA: Diagnosis not present

## 2022-02-22 ENCOUNTER — Inpatient Hospital Stay: Payer: Medicare Other

## 2022-02-22 ENCOUNTER — Encounter: Payer: Self-pay | Admitting: Nurse Practitioner

## 2022-02-22 ENCOUNTER — Ambulatory Visit
Admission: RE | Admit: 2022-02-22 | Discharge: 2022-02-22 | Disposition: A | Discharge status: Home / Self Care | Payer: Medicare Other | Source: Ambulatory Visit | Attending: Family Medicine | Admitting: Family Medicine

## 2022-02-22 ENCOUNTER — Inpatient Hospital Stay (HOSPITAL_BASED_OUTPATIENT_CLINIC_OR_DEPARTMENT_OTHER): Payer: Medicare Other | Admitting: Nurse Practitioner

## 2022-02-22 VITALS — BP 147/59 | HR 77 | Temp 98.2°F | Resp 18 | Ht 64.0 in | Wt 238.0 lb

## 2022-02-22 DIAGNOSIS — D72829 Elevated white blood cell count, unspecified: Secondary | ICD-10-CM | POA: Diagnosis not present

## 2022-02-22 DIAGNOSIS — D649 Anemia, unspecified: Secondary | ICD-10-CM | POA: Diagnosis not present

## 2022-02-22 DIAGNOSIS — Z1231 Encounter for screening mammogram for malignant neoplasm of breast: Secondary | ICD-10-CM

## 2022-02-22 LAB — CBC WITH DIFFERENTIAL (CANCER CENTER ONLY)
Abs Immature Granulocytes: 0.05 10*3/uL (ref 0.00–0.07)
Basophils Absolute: 0 10*3/uL (ref 0.0–0.1)
Basophils Relative: 0 %
Eosinophils Absolute: 0.1 10*3/uL (ref 0.0–0.5)
Eosinophils Relative: 1 %
HCT: 31.5 % — ABNORMAL LOW (ref 36.0–46.0)
Hemoglobin: 10.4 g/dL — ABNORMAL LOW (ref 12.0–15.0)
Immature Granulocytes: 0 %
Lymphocytes Relative: 17 %
Lymphs Abs: 2.2 10*3/uL (ref 0.7–4.0)
MCH: 29 pg (ref 26.0–34.0)
MCHC: 33 g/dL (ref 30.0–36.0)
MCV: 87.7 fL (ref 80.0–100.0)
Monocytes Absolute: 0.7 10*3/uL (ref 0.1–1.0)
Monocytes Relative: 6 %
Neutro Abs: 9.9 10*3/uL — ABNORMAL HIGH (ref 1.7–7.7)
Neutrophils Relative %: 76 %
Platelet Count: 346 10*3/uL (ref 150–400)
RBC: 3.59 MIL/uL — ABNORMAL LOW (ref 3.87–5.11)
RDW: 12.8 % (ref 11.5–15.5)
WBC Count: 13 10*3/uL — ABNORMAL HIGH (ref 4.0–10.5)
nRBC: 0 % (ref 0.0–0.2)

## 2022-02-22 LAB — CMP (CANCER CENTER ONLY)
ALT: 11 U/L (ref 0–44)
AST: 15 U/L (ref 15–41)
Albumin: 4.4 g/dL (ref 3.5–5.0)
Alkaline Phosphatase: 44 U/L (ref 38–126)
Anion gap: 12 (ref 5–15)
BUN: 30 mg/dL — ABNORMAL HIGH (ref 8–23)
CO2: 25 mmol/L (ref 22–32)
Calcium: 10.5 mg/dL — ABNORMAL HIGH (ref 8.9–10.3)
Chloride: 101 mmol/L (ref 98–111)
Creatinine: 1.59 mg/dL — ABNORMAL HIGH (ref 0.44–1.00)
GFR, Estimated: 32 mL/min — ABNORMAL LOW (ref >60)
Glucose, Bld: 105 mg/dL — ABNORMAL HIGH (ref 70–99)
Potassium: 4.1 mmol/L (ref 3.5–5.1)
Sodium: 138 mmol/L (ref 135–145)
Total Bilirubin: 0.7 mg/dL (ref 0.3–1.2)
Total Protein: 7 g/dL (ref 6.5–8.1)

## 2022-02-22 NOTE — Progress Notes (Signed)
  [facility] OFFICE PROGRESS NOTE   Diagnosis: Elevated white blood cell count, anemia  INTERVAL HISTORY:   [name] returns for follow-up.  She was seen in the emergency department 01/09/2022 after a fall.  She was subsequently discharged to a nursing facility.  She has returned home.  No more falls.  She ambulates with a walker.  She is participating in home physical therapy.  Objective:  Vital signs in last 24 hours:  Blood pressure (!) 147/59, pulse 77, temperature 98.2 F (36.8 C), temperature source Oral, resp. rate 18, height 5' 4"$  (1.626 m), weight 238 lb (108 kg), SpO2 100 %.    HEENT: No thrush or ulcers. Resp: Lungs clear bilaterally. Cardio: Regular rate and rhythm. GI: Abdomen soft and nontender.  No hepatosplenomegaly. Vascular: Pitting edema lower leg bilaterally.   Lab Results:  Lab Results  Component Value Date   WBC 13.0 (H) 02/22/2022   HGB 10.4 (L) 02/22/2022   HCT 31.5 (L) 02/22/2022   MCV 87.7 02/22/2022   PLT 346 02/22/2022   NEUTROABS 9.9 (H) 02/22/2022    Imaging:  No results found.  Medications: I have reviewed the patient's current medications.  Assessment/Plan: Normocytic anemia and leukocytosis 11/16/2021 ferritin 63; erythropoietin 10.4; B12 391; myeloma panel with 0.3 g/dL M spike with IFE showing IgA monoclonal protein with lambda light chain specificity; Kappa free light chains 47.8, lambda free light chains 58.5, normal ratio Hypertension Diabetes Chronic kidney disease History of thyroid cancer   Disposition: [name] appears stable.  We reviewed the CBC from today.  Hemoglobin is better, stable leukocytosis.  We will follow-up on the light chains from today.  For now we will continue to monitor the CBC with the plan for a diagnostic bone marrow biopsy if she develops progressive anemia or new hematologic finding.  She will return for lab and follow-up in 3 months.  Ned Card ANP/GNP-BC   02/22/2022  2:48  PM

## 2022-02-23 LAB — KAPPA/LAMBDA LIGHT CHAINS
Kappa free light chain: 37 mg/L — ABNORMAL HIGH (ref 3.3–19.4)
Kappa, lambda light chain ratio: 0.58 (ref 0.26–1.65)
Lambda free light chains: 63.6 mg/L — ABNORMAL HIGH (ref 5.7–26.3)

## 2022-02-23 LAB — IGA: IgA: 467 mg/dL — ABNORMAL HIGH (ref 64–422)

## 2022-02-24 DIAGNOSIS — I69351 Hemiplegia and hemiparesis following cerebral infarction affecting right dominant side: Secondary | ICD-10-CM | POA: Diagnosis not present

## 2022-02-24 DIAGNOSIS — W19XXXD Unspecified fall, subsequent encounter: Secondary | ICD-10-CM | POA: Diagnosis not present

## 2022-02-24 DIAGNOSIS — I129 Hypertensive chronic kidney disease with stage 1 through stage 4 chronic kidney disease, or unspecified chronic kidney disease: Secondary | ICD-10-CM | POA: Diagnosis not present

## 2022-02-24 DIAGNOSIS — N1831 Chronic kidney disease, stage 3a: Secondary | ICD-10-CM | POA: Diagnosis not present

## 2022-02-24 DIAGNOSIS — D72829 Elevated white blood cell count, unspecified: Secondary | ICD-10-CM | POA: Diagnosis not present

## 2022-02-24 DIAGNOSIS — E1122 Type 2 diabetes mellitus with diabetic chronic kidney disease: Secondary | ICD-10-CM | POA: Diagnosis not present

## 2022-02-24 DIAGNOSIS — H35033 Hypertensive retinopathy, bilateral: Secondary | ICD-10-CM | POA: Diagnosis not present

## 2022-02-24 DIAGNOSIS — B029 Zoster without complications: Secondary | ICD-10-CM | POA: Diagnosis not present

## 2022-02-24 DIAGNOSIS — I700 Atherosclerosis of aorta: Secondary | ICD-10-CM | POA: Diagnosis not present

## 2022-02-24 DIAGNOSIS — E7800 Pure hypercholesterolemia, unspecified: Secondary | ICD-10-CM | POA: Diagnosis not present

## 2022-02-24 DIAGNOSIS — D631 Anemia in chronic kidney disease: Secondary | ICD-10-CM | POA: Diagnosis not present

## 2022-02-24 DIAGNOSIS — Z794 Long term (current) use of insulin: Secondary | ICD-10-CM | POA: Diagnosis not present

## 2022-02-24 DIAGNOSIS — Z7982 Long term (current) use of aspirin: Secondary | ICD-10-CM | POA: Diagnosis not present

## 2022-02-24 DIAGNOSIS — G8929 Other chronic pain: Secondary | ICD-10-CM | POA: Diagnosis not present

## 2022-02-24 DIAGNOSIS — E890 Postprocedural hypothyroidism: Secondary | ICD-10-CM | POA: Diagnosis not present

## 2022-02-24 DIAGNOSIS — Z9181 History of falling: Secondary | ICD-10-CM | POA: Diagnosis not present

## 2022-02-24 DIAGNOSIS — E113293 Type 2 diabetes mellitus with mild nonproliferative diabetic retinopathy without macular edema, bilateral: Secondary | ICD-10-CM | POA: Diagnosis not present

## 2022-02-24 DIAGNOSIS — R600 Localized edema: Secondary | ICD-10-CM | POA: Diagnosis not present

## 2022-02-24 DIAGNOSIS — M810 Age-related osteoporosis without current pathological fracture: Secondary | ICD-10-CM | POA: Diagnosis not present

## 2022-02-24 DIAGNOSIS — M4802 Spinal stenosis, cervical region: Secondary | ICD-10-CM | POA: Diagnosis not present

## 2022-02-24 DIAGNOSIS — N2581 Secondary hyperparathyroidism of renal origin: Secondary | ICD-10-CM | POA: Diagnosis not present

## 2022-02-24 DIAGNOSIS — M5135 Other intervertebral disc degeneration, thoracolumbar region: Secondary | ICD-10-CM | POA: Diagnosis not present

## 2022-02-24 DIAGNOSIS — M48061 Spinal stenosis, lumbar region without neurogenic claudication: Secondary | ICD-10-CM | POA: Diagnosis not present

## 2022-02-24 DIAGNOSIS — M419 Scoliosis, unspecified: Secondary | ICD-10-CM | POA: Diagnosis not present

## 2022-02-26 DIAGNOSIS — Z7982 Long term (current) use of aspirin: Secondary | ICD-10-CM | POA: Diagnosis not present

## 2022-02-26 DIAGNOSIS — H35033 Hypertensive retinopathy, bilateral: Secondary | ICD-10-CM | POA: Diagnosis not present

## 2022-02-26 DIAGNOSIS — I129 Hypertensive chronic kidney disease with stage 1 through stage 4 chronic kidney disease, or unspecified chronic kidney disease: Secondary | ICD-10-CM | POA: Diagnosis not present

## 2022-02-26 DIAGNOSIS — W19XXXD Unspecified fall, subsequent encounter: Secondary | ICD-10-CM | POA: Diagnosis not present

## 2022-02-26 DIAGNOSIS — I69351 Hemiplegia and hemiparesis following cerebral infarction affecting right dominant side: Secondary | ICD-10-CM | POA: Diagnosis not present

## 2022-02-26 DIAGNOSIS — D72829 Elevated white blood cell count, unspecified: Secondary | ICD-10-CM | POA: Diagnosis not present

## 2022-02-26 DIAGNOSIS — N1831 Chronic kidney disease, stage 3a: Secondary | ICD-10-CM | POA: Diagnosis not present

## 2022-02-26 DIAGNOSIS — M5135 Other intervertebral disc degeneration, thoracolumbar region: Secondary | ICD-10-CM | POA: Diagnosis not present

## 2022-02-26 DIAGNOSIS — M419 Scoliosis, unspecified: Secondary | ICD-10-CM | POA: Diagnosis not present

## 2022-02-26 DIAGNOSIS — G8929 Other chronic pain: Secondary | ICD-10-CM | POA: Diagnosis not present

## 2022-02-26 DIAGNOSIS — B029 Zoster without complications: Secondary | ICD-10-CM | POA: Diagnosis not present

## 2022-02-26 DIAGNOSIS — E7800 Pure hypercholesterolemia, unspecified: Secondary | ICD-10-CM | POA: Diagnosis not present

## 2022-02-26 DIAGNOSIS — Z9181 History of falling: Secondary | ICD-10-CM | POA: Diagnosis not present

## 2022-02-26 DIAGNOSIS — I700 Atherosclerosis of aorta: Secondary | ICD-10-CM | POA: Diagnosis not present

## 2022-02-26 DIAGNOSIS — M48061 Spinal stenosis, lumbar region without neurogenic claudication: Secondary | ICD-10-CM | POA: Diagnosis not present

## 2022-02-26 DIAGNOSIS — R600 Localized edema: Secondary | ICD-10-CM | POA: Diagnosis not present

## 2022-02-26 DIAGNOSIS — M4802 Spinal stenosis, cervical region: Secondary | ICD-10-CM | POA: Diagnosis not present

## 2022-02-26 DIAGNOSIS — E1122 Type 2 diabetes mellitus with diabetic chronic kidney disease: Secondary | ICD-10-CM | POA: Diagnosis not present

## 2022-02-26 DIAGNOSIS — M810 Age-related osteoporosis without current pathological fracture: Secondary | ICD-10-CM | POA: Diagnosis not present

## 2022-02-26 DIAGNOSIS — D631 Anemia in chronic kidney disease: Secondary | ICD-10-CM | POA: Diagnosis not present

## 2022-02-26 DIAGNOSIS — E113293 Type 2 diabetes mellitus with mild nonproliferative diabetic retinopathy without macular edema, bilateral: Secondary | ICD-10-CM | POA: Diagnosis not present

## 2022-02-26 DIAGNOSIS — E890 Postprocedural hypothyroidism: Secondary | ICD-10-CM | POA: Diagnosis not present

## 2022-02-26 DIAGNOSIS — N2581 Secondary hyperparathyroidism of renal origin: Secondary | ICD-10-CM | POA: Diagnosis not present

## 2022-02-26 DIAGNOSIS — Z794 Long term (current) use of insulin: Secondary | ICD-10-CM | POA: Diagnosis not present

## 2022-03-03 DIAGNOSIS — D72829 Elevated white blood cell count, unspecified: Secondary | ICD-10-CM | POA: Diagnosis not present

## 2022-03-03 DIAGNOSIS — E890 Postprocedural hypothyroidism: Secondary | ICD-10-CM | POA: Diagnosis not present

## 2022-03-03 DIAGNOSIS — E7800 Pure hypercholesterolemia, unspecified: Secondary | ICD-10-CM | POA: Diagnosis not present

## 2022-03-03 DIAGNOSIS — R600 Localized edema: Secondary | ICD-10-CM | POA: Diagnosis not present

## 2022-03-03 DIAGNOSIS — I700 Atherosclerosis of aorta: Secondary | ICD-10-CM | POA: Diagnosis not present

## 2022-03-03 DIAGNOSIS — I129 Hypertensive chronic kidney disease with stage 1 through stage 4 chronic kidney disease, or unspecified chronic kidney disease: Secondary | ICD-10-CM | POA: Diagnosis not present

## 2022-03-03 DIAGNOSIS — M48061 Spinal stenosis, lumbar region without neurogenic claudication: Secondary | ICD-10-CM | POA: Diagnosis not present

## 2022-03-03 DIAGNOSIS — H35033 Hypertensive retinopathy, bilateral: Secondary | ICD-10-CM | POA: Diagnosis not present

## 2022-03-03 DIAGNOSIS — D631 Anemia in chronic kidney disease: Secondary | ICD-10-CM | POA: Diagnosis not present

## 2022-03-03 DIAGNOSIS — Z794 Long term (current) use of insulin: Secondary | ICD-10-CM | POA: Diagnosis not present

## 2022-03-03 DIAGNOSIS — N1831 Chronic kidney disease, stage 3a: Secondary | ICD-10-CM | POA: Diagnosis not present

## 2022-03-03 DIAGNOSIS — Z7982 Long term (current) use of aspirin: Secondary | ICD-10-CM | POA: Diagnosis not present

## 2022-03-03 DIAGNOSIS — B029 Zoster without complications: Secondary | ICD-10-CM | POA: Diagnosis not present

## 2022-03-03 DIAGNOSIS — Z9181 History of falling: Secondary | ICD-10-CM | POA: Diagnosis not present

## 2022-03-03 DIAGNOSIS — E1122 Type 2 diabetes mellitus with diabetic chronic kidney disease: Secondary | ICD-10-CM | POA: Diagnosis not present

## 2022-03-03 DIAGNOSIS — I69351 Hemiplegia and hemiparesis following cerebral infarction affecting right dominant side: Secondary | ICD-10-CM | POA: Diagnosis not present

## 2022-03-03 DIAGNOSIS — M5135 Other intervertebral disc degeneration, thoracolumbar region: Secondary | ICD-10-CM | POA: Diagnosis not present

## 2022-03-03 DIAGNOSIS — M810 Age-related osteoporosis without current pathological fracture: Secondary | ICD-10-CM | POA: Diagnosis not present

## 2022-03-03 DIAGNOSIS — M419 Scoliosis, unspecified: Secondary | ICD-10-CM | POA: Diagnosis not present

## 2022-03-03 DIAGNOSIS — G8929 Other chronic pain: Secondary | ICD-10-CM | POA: Diagnosis not present

## 2022-03-03 DIAGNOSIS — W19XXXD Unspecified fall, subsequent encounter: Secondary | ICD-10-CM | POA: Diagnosis not present

## 2022-03-03 DIAGNOSIS — N2581 Secondary hyperparathyroidism of renal origin: Secondary | ICD-10-CM | POA: Diagnosis not present

## 2022-03-03 DIAGNOSIS — M4802 Spinal stenosis, cervical region: Secondary | ICD-10-CM | POA: Diagnosis not present

## 2022-03-03 DIAGNOSIS — E113293 Type 2 diabetes mellitus with mild nonproliferative diabetic retinopathy without macular edema, bilateral: Secondary | ICD-10-CM | POA: Diagnosis not present

## 2022-03-04 DIAGNOSIS — M810 Age-related osteoporosis without current pathological fracture: Secondary | ICD-10-CM | POA: Diagnosis not present

## 2022-03-04 DIAGNOSIS — I10 Essential (primary) hypertension: Secondary | ICD-10-CM | POA: Diagnosis not present

## 2022-03-04 DIAGNOSIS — E7800 Pure hypercholesterolemia, unspecified: Secondary | ICD-10-CM | POA: Diagnosis not present

## 2022-03-04 DIAGNOSIS — N1832 Chronic kidney disease, stage 3b: Secondary | ICD-10-CM | POA: Diagnosis not present

## 2022-03-04 DIAGNOSIS — E1169 Type 2 diabetes mellitus with other specified complication: Secondary | ICD-10-CM | POA: Diagnosis not present

## 2022-03-05 DIAGNOSIS — G8929 Other chronic pain: Secondary | ICD-10-CM | POA: Diagnosis not present

## 2022-03-05 DIAGNOSIS — M419 Scoliosis, unspecified: Secondary | ICD-10-CM | POA: Diagnosis not present

## 2022-03-05 DIAGNOSIS — N2581 Secondary hyperparathyroidism of renal origin: Secondary | ICD-10-CM | POA: Diagnosis not present

## 2022-03-05 DIAGNOSIS — Z7982 Long term (current) use of aspirin: Secondary | ICD-10-CM | POA: Diagnosis not present

## 2022-03-05 DIAGNOSIS — R600 Localized edema: Secondary | ICD-10-CM | POA: Diagnosis not present

## 2022-03-05 DIAGNOSIS — E7800 Pure hypercholesterolemia, unspecified: Secondary | ICD-10-CM | POA: Diagnosis not present

## 2022-03-05 DIAGNOSIS — D72829 Elevated white blood cell count, unspecified: Secondary | ICD-10-CM | POA: Diagnosis not present

## 2022-03-05 DIAGNOSIS — M48061 Spinal stenosis, lumbar region without neurogenic claudication: Secondary | ICD-10-CM | POA: Diagnosis not present

## 2022-03-05 DIAGNOSIS — I700 Atherosclerosis of aorta: Secondary | ICD-10-CM | POA: Diagnosis not present

## 2022-03-05 DIAGNOSIS — I129 Hypertensive chronic kidney disease with stage 1 through stage 4 chronic kidney disease, or unspecified chronic kidney disease: Secondary | ICD-10-CM | POA: Diagnosis not present

## 2022-03-05 DIAGNOSIS — H35033 Hypertensive retinopathy, bilateral: Secondary | ICD-10-CM | POA: Diagnosis not present

## 2022-03-05 DIAGNOSIS — N1831 Chronic kidney disease, stage 3a: Secondary | ICD-10-CM | POA: Diagnosis not present

## 2022-03-05 DIAGNOSIS — E113293 Type 2 diabetes mellitus with mild nonproliferative diabetic retinopathy without macular edema, bilateral: Secondary | ICD-10-CM | POA: Diagnosis not present

## 2022-03-05 DIAGNOSIS — Z9181 History of falling: Secondary | ICD-10-CM | POA: Diagnosis not present

## 2022-03-05 DIAGNOSIS — M4802 Spinal stenosis, cervical region: Secondary | ICD-10-CM | POA: Diagnosis not present

## 2022-03-05 DIAGNOSIS — Z794 Long term (current) use of insulin: Secondary | ICD-10-CM | POA: Diagnosis not present

## 2022-03-05 DIAGNOSIS — E890 Postprocedural hypothyroidism: Secondary | ICD-10-CM | POA: Diagnosis not present

## 2022-03-05 DIAGNOSIS — I69351 Hemiplegia and hemiparesis following cerebral infarction affecting right dominant side: Secondary | ICD-10-CM | POA: Diagnosis not present

## 2022-03-05 DIAGNOSIS — W19XXXD Unspecified fall, subsequent encounter: Secondary | ICD-10-CM | POA: Diagnosis not present

## 2022-03-05 DIAGNOSIS — M5135 Other intervertebral disc degeneration, thoracolumbar region: Secondary | ICD-10-CM | POA: Diagnosis not present

## 2022-03-05 DIAGNOSIS — E1122 Type 2 diabetes mellitus with diabetic chronic kidney disease: Secondary | ICD-10-CM | POA: Diagnosis not present

## 2022-03-05 DIAGNOSIS — D631 Anemia in chronic kidney disease: Secondary | ICD-10-CM | POA: Diagnosis not present

## 2022-03-05 DIAGNOSIS — B029 Zoster without complications: Secondary | ICD-10-CM | POA: Diagnosis not present

## 2022-03-05 DIAGNOSIS — M810 Age-related osteoporosis without current pathological fracture: Secondary | ICD-10-CM | POA: Diagnosis not present

## 2022-03-08 DIAGNOSIS — E1122 Type 2 diabetes mellitus with diabetic chronic kidney disease: Secondary | ICD-10-CM | POA: Diagnosis not present

## 2022-03-08 DIAGNOSIS — Z794 Long term (current) use of insulin: Secondary | ICD-10-CM | POA: Diagnosis not present

## 2022-03-08 DIAGNOSIS — D72829 Elevated white blood cell count, unspecified: Secondary | ICD-10-CM | POA: Diagnosis not present

## 2022-03-08 DIAGNOSIS — M4802 Spinal stenosis, cervical region: Secondary | ICD-10-CM | POA: Diagnosis not present

## 2022-03-08 DIAGNOSIS — G8929 Other chronic pain: Secondary | ICD-10-CM | POA: Diagnosis not present

## 2022-03-08 DIAGNOSIS — I700 Atherosclerosis of aorta: Secondary | ICD-10-CM | POA: Diagnosis not present

## 2022-03-08 DIAGNOSIS — M48061 Spinal stenosis, lumbar region without neurogenic claudication: Secondary | ICD-10-CM | POA: Diagnosis not present

## 2022-03-08 DIAGNOSIS — M419 Scoliosis, unspecified: Secondary | ICD-10-CM | POA: Diagnosis not present

## 2022-03-08 DIAGNOSIS — B029 Zoster without complications: Secondary | ICD-10-CM | POA: Diagnosis not present

## 2022-03-08 DIAGNOSIS — Z7982 Long term (current) use of aspirin: Secondary | ICD-10-CM | POA: Diagnosis not present

## 2022-03-08 DIAGNOSIS — E113293 Type 2 diabetes mellitus with mild nonproliferative diabetic retinopathy without macular edema, bilateral: Secondary | ICD-10-CM | POA: Diagnosis not present

## 2022-03-08 DIAGNOSIS — N1831 Chronic kidney disease, stage 3a: Secondary | ICD-10-CM | POA: Diagnosis not present

## 2022-03-08 DIAGNOSIS — M810 Age-related osteoporosis without current pathological fracture: Secondary | ICD-10-CM | POA: Diagnosis not present

## 2022-03-08 DIAGNOSIS — N2581 Secondary hyperparathyroidism of renal origin: Secondary | ICD-10-CM | POA: Diagnosis not present

## 2022-03-08 DIAGNOSIS — D631 Anemia in chronic kidney disease: Secondary | ICD-10-CM | POA: Diagnosis not present

## 2022-03-08 DIAGNOSIS — R600 Localized edema: Secondary | ICD-10-CM | POA: Diagnosis not present

## 2022-03-08 DIAGNOSIS — I69351 Hemiplegia and hemiparesis following cerebral infarction affecting right dominant side: Secondary | ICD-10-CM | POA: Diagnosis not present

## 2022-03-08 DIAGNOSIS — H35033 Hypertensive retinopathy, bilateral: Secondary | ICD-10-CM | POA: Diagnosis not present

## 2022-03-08 DIAGNOSIS — I129 Hypertensive chronic kidney disease with stage 1 through stage 4 chronic kidney disease, or unspecified chronic kidney disease: Secondary | ICD-10-CM | POA: Diagnosis not present

## 2022-03-08 DIAGNOSIS — E7800 Pure hypercholesterolemia, unspecified: Secondary | ICD-10-CM | POA: Diagnosis not present

## 2022-03-08 DIAGNOSIS — M5135 Other intervertebral disc degeneration, thoracolumbar region: Secondary | ICD-10-CM | POA: Diagnosis not present

## 2022-03-08 DIAGNOSIS — W19XXXD Unspecified fall, subsequent encounter: Secondary | ICD-10-CM | POA: Diagnosis not present

## 2022-03-08 DIAGNOSIS — E890 Postprocedural hypothyroidism: Secondary | ICD-10-CM | POA: Diagnosis not present

## 2022-03-08 DIAGNOSIS — Z9181 History of falling: Secondary | ICD-10-CM | POA: Diagnosis not present

## 2022-03-11 DIAGNOSIS — H35033 Hypertensive retinopathy, bilateral: Secondary | ICD-10-CM | POA: Diagnosis not present

## 2022-03-11 DIAGNOSIS — M4802 Spinal stenosis, cervical region: Secondary | ICD-10-CM | POA: Diagnosis not present

## 2022-03-11 DIAGNOSIS — M810 Age-related osteoporosis without current pathological fracture: Secondary | ICD-10-CM | POA: Diagnosis not present

## 2022-03-11 DIAGNOSIS — Z7982 Long term (current) use of aspirin: Secondary | ICD-10-CM | POA: Diagnosis not present

## 2022-03-11 DIAGNOSIS — G8929 Other chronic pain: Secondary | ICD-10-CM | POA: Diagnosis not present

## 2022-03-11 DIAGNOSIS — M48061 Spinal stenosis, lumbar region without neurogenic claudication: Secondary | ICD-10-CM | POA: Diagnosis not present

## 2022-03-11 DIAGNOSIS — B029 Zoster without complications: Secondary | ICD-10-CM | POA: Diagnosis not present

## 2022-03-11 DIAGNOSIS — D631 Anemia in chronic kidney disease: Secondary | ICD-10-CM | POA: Diagnosis not present

## 2022-03-11 DIAGNOSIS — M419 Scoliosis, unspecified: Secondary | ICD-10-CM | POA: Diagnosis not present

## 2022-03-11 DIAGNOSIS — N2581 Secondary hyperparathyroidism of renal origin: Secondary | ICD-10-CM | POA: Diagnosis not present

## 2022-03-11 DIAGNOSIS — Z9181 History of falling: Secondary | ICD-10-CM | POA: Diagnosis not present

## 2022-03-11 DIAGNOSIS — D72829 Elevated white blood cell count, unspecified: Secondary | ICD-10-CM | POA: Diagnosis not present

## 2022-03-11 DIAGNOSIS — I69351 Hemiplegia and hemiparesis following cerebral infarction affecting right dominant side: Secondary | ICD-10-CM | POA: Diagnosis not present

## 2022-03-11 DIAGNOSIS — I129 Hypertensive chronic kidney disease with stage 1 through stage 4 chronic kidney disease, or unspecified chronic kidney disease: Secondary | ICD-10-CM | POA: Diagnosis not present

## 2022-03-11 DIAGNOSIS — E890 Postprocedural hypothyroidism: Secondary | ICD-10-CM | POA: Diagnosis not present

## 2022-03-11 DIAGNOSIS — Z794 Long term (current) use of insulin: Secondary | ICD-10-CM | POA: Diagnosis not present

## 2022-03-11 DIAGNOSIS — W19XXXD Unspecified fall, subsequent encounter: Secondary | ICD-10-CM | POA: Diagnosis not present

## 2022-03-11 DIAGNOSIS — E113293 Type 2 diabetes mellitus with mild nonproliferative diabetic retinopathy without macular edema, bilateral: Secondary | ICD-10-CM | POA: Diagnosis not present

## 2022-03-11 DIAGNOSIS — M5135 Other intervertebral disc degeneration, thoracolumbar region: Secondary | ICD-10-CM | POA: Diagnosis not present

## 2022-03-11 DIAGNOSIS — R600 Localized edema: Secondary | ICD-10-CM | POA: Diagnosis not present

## 2022-03-11 DIAGNOSIS — E1122 Type 2 diabetes mellitus with diabetic chronic kidney disease: Secondary | ICD-10-CM | POA: Diagnosis not present

## 2022-03-11 DIAGNOSIS — I700 Atherosclerosis of aorta: Secondary | ICD-10-CM | POA: Diagnosis not present

## 2022-03-11 DIAGNOSIS — E7800 Pure hypercholesterolemia, unspecified: Secondary | ICD-10-CM | POA: Diagnosis not present

## 2022-03-11 DIAGNOSIS — N1831 Chronic kidney disease, stage 3a: Secondary | ICD-10-CM | POA: Diagnosis not present

## 2022-03-17 DIAGNOSIS — R200 Anesthesia of skin: Secondary | ICD-10-CM | POA: Diagnosis not present

## 2022-03-17 DIAGNOSIS — N1831 Chronic kidney disease, stage 3a: Secondary | ICD-10-CM | POA: Diagnosis not present

## 2022-03-17 DIAGNOSIS — R2689 Other abnormalities of gait and mobility: Secondary | ICD-10-CM | POA: Diagnosis not present

## 2022-03-25 ENCOUNTER — Telehealth: Payer: Self-pay

## 2022-03-25 DIAGNOSIS — I8393 Asymptomatic varicose veins of bilateral lower extremities: Secondary | ICD-10-CM

## 2022-03-25 NOTE — Telephone Encounter (Addendum)
Pt walked into office to purchase compression stockings. Pt had been measured at her prior visit. Two pairs of stockings per rx and size purchased.

## 2022-03-30 DIAGNOSIS — N2581 Secondary hyperparathyroidism of renal origin: Secondary | ICD-10-CM | POA: Diagnosis not present

## 2022-03-30 DIAGNOSIS — E1122 Type 2 diabetes mellitus with diabetic chronic kidney disease: Secondary | ICD-10-CM | POA: Diagnosis not present

## 2022-03-30 DIAGNOSIS — E113293 Type 2 diabetes mellitus with mild nonproliferative diabetic retinopathy without macular edema, bilateral: Secondary | ICD-10-CM | POA: Diagnosis not present

## 2022-03-30 DIAGNOSIS — Z9181 History of falling: Secondary | ICD-10-CM | POA: Diagnosis not present

## 2022-03-30 DIAGNOSIS — E890 Postprocedural hypothyroidism: Secondary | ICD-10-CM | POA: Diagnosis not present

## 2022-03-30 DIAGNOSIS — I700 Atherosclerosis of aorta: Secondary | ICD-10-CM | POA: Diagnosis not present

## 2022-03-30 DIAGNOSIS — E7800 Pure hypercholesterolemia, unspecified: Secondary | ICD-10-CM | POA: Diagnosis not present

## 2022-03-30 DIAGNOSIS — N1831 Chronic kidney disease, stage 3a: Secondary | ICD-10-CM | POA: Diagnosis not present

## 2022-03-30 DIAGNOSIS — Z0000 Encounter for general adult medical examination without abnormal findings: Secondary | ICD-10-CM | POA: Diagnosis not present

## 2022-04-05 ENCOUNTER — Ambulatory Visit: Payer: Medicare Other | Admitting: Podiatry

## 2022-04-26 ENCOUNTER — Ambulatory Visit (INDEPENDENT_AMBULATORY_CARE_PROVIDER_SITE_OTHER): Payer: Medicare Other | Admitting: Podiatry

## 2022-04-26 DIAGNOSIS — Z91199 Patient's noncompliance with other medical treatment and regimen due to unspecified reason: Secondary | ICD-10-CM

## 2022-04-26 NOTE — Progress Notes (Signed)
No show

## 2022-05-24 ENCOUNTER — Inpatient Hospital Stay (HOSPITAL_BASED_OUTPATIENT_CLINIC_OR_DEPARTMENT_OTHER): Payer: Medicare Other | Admitting: Oncology

## 2022-05-24 ENCOUNTER — Inpatient Hospital Stay: Payer: Medicare Other | Attending: Nurse Practitioner

## 2022-05-24 VITALS — BP 150/60 | HR 79 | Temp 98.1°F | Resp 20 | Ht 64.0 in | Wt 243.0 lb

## 2022-05-24 DIAGNOSIS — C549 Malignant neoplasm of corpus uteri, unspecified: Secondary | ICD-10-CM | POA: Insufficient documentation

## 2022-05-24 DIAGNOSIS — I129 Hypertensive chronic kidney disease with stage 1 through stage 4 chronic kidney disease, or unspecified chronic kidney disease: Secondary | ICD-10-CM | POA: Diagnosis not present

## 2022-05-24 DIAGNOSIS — G8929 Other chronic pain: Secondary | ICD-10-CM | POA: Insufficient documentation

## 2022-05-24 DIAGNOSIS — D649 Anemia, unspecified: Secondary | ICD-10-CM | POA: Diagnosis not present

## 2022-05-24 DIAGNOSIS — D72829 Elevated white blood cell count, unspecified: Secondary | ICD-10-CM | POA: Insufficient documentation

## 2022-05-24 DIAGNOSIS — Z85850 Personal history of malignant neoplasm of thyroid: Secondary | ICD-10-CM | POA: Diagnosis not present

## 2022-05-24 DIAGNOSIS — E1122 Type 2 diabetes mellitus with diabetic chronic kidney disease: Secondary | ICD-10-CM | POA: Diagnosis not present

## 2022-05-24 DIAGNOSIS — N189 Chronic kidney disease, unspecified: Secondary | ICD-10-CM | POA: Diagnosis not present

## 2022-05-24 LAB — CBC WITH DIFFERENTIAL (CANCER CENTER ONLY)
Abs Immature Granulocytes: 0.04 10*3/uL (ref 0.00–0.07)
Basophils Absolute: 0.1 10*3/uL (ref 0.0–0.1)
Basophils Relative: 0 %
Eosinophils Absolute: 0.1 10*3/uL (ref 0.0–0.5)
Eosinophils Relative: 1 %
HCT: 28.7 % — ABNORMAL LOW (ref 36.0–46.0)
Hemoglobin: 9.4 g/dL — ABNORMAL LOW (ref 12.0–15.0)
Immature Granulocytes: 0 %
Lymphocytes Relative: 16 %
Lymphs Abs: 1.9 10*3/uL (ref 0.7–4.0)
MCH: 29.5 pg (ref 26.0–34.0)
MCHC: 32.8 g/dL (ref 30.0–36.0)
MCV: 90 fL (ref 80.0–100.0)
Monocytes Absolute: 0.7 10*3/uL (ref 0.1–1.0)
Monocytes Relative: 6 %
Neutro Abs: 9.3 10*3/uL — ABNORMAL HIGH (ref 1.7–7.7)
Neutrophils Relative %: 77 %
Platelet Count: 295 10*3/uL (ref 150–400)
RBC: 3.19 MIL/uL — ABNORMAL LOW (ref 3.87–5.11)
RDW: 13 % (ref 11.5–15.5)
WBC Count: 12.1 10*3/uL — ABNORMAL HIGH (ref 4.0–10.5)
nRBC: 0 % (ref 0.0–0.2)

## 2022-05-24 LAB — CMP (CANCER CENTER ONLY)
ALT: 9 U/L (ref 0–44)
AST: 11 U/L — ABNORMAL LOW (ref 15–41)
Albumin: 4 g/dL (ref 3.5–5.0)
Alkaline Phosphatase: 46 U/L (ref 38–126)
Anion gap: 8 (ref 5–15)
BUN: 28 mg/dL — ABNORMAL HIGH (ref 8–23)
CO2: 27 mmol/L (ref 22–32)
Calcium: 9.1 mg/dL (ref 8.9–10.3)
Chloride: 104 mmol/L (ref 98–111)
Creatinine: 1.41 mg/dL — ABNORMAL HIGH (ref 0.44–1.00)
GFR, Estimated: 37 mL/min — ABNORMAL LOW (ref >60)
Glucose, Bld: 168 mg/dL — ABNORMAL HIGH (ref 70–99)
Potassium: 3.9 mmol/L (ref 3.5–5.1)
Sodium: 139 mmol/L (ref 135–145)
Total Bilirubin: 0.5 mg/dL (ref 0.3–1.2)
Total Protein: 6.6 g/dL (ref 6.5–8.1)

## 2022-05-24 NOTE — Progress Notes (Signed)
  [facility] OFFICE PROGRESS NOTE   Diagnosis: Anemia, leukocytosis  INTERVAL HISTORY:   Ms. [name] returns as scheduled.  Good appetite.  She has constipation when taking iron and pain medication.  She has chronic back pain.  She has an intermittent "twinge "of discomfort at the lower abdomen.  Objective:  Vital signs in last 24 hours:  Blood pressure (!) 150/60, pulse 79, temperature 98.1 F (36.7 C), temperature source Oral, resp. rate 20, height 5\' 4"  (1.626 m), weight 243 lb (110.2 kg), SpO2 98 %.   Lymphatics: No cervical, supraclavicular, axillary, or inguinal nodes Resp: Lungs clear bilaterally Cardio: Regular rate and rhythm GI: No hepatosplenomegaly, no mass, nontender Vascular: No leg edema   Lab Results:  Lab Results  Component Value Date   WBC 12.1 (H) 05/24/2022   HGB 9.4 (L) 05/24/2022   HCT 28.7 (L) 05/24/2022   MCV 90.0 05/24/2022   PLT 295 05/24/2022   NEUTROABS 9.3 (H) 05/24/2022    CMP  Lab Results  Component Value Date   NA 138 02/22/2022   K 4.1 02/22/2022   CL 101 02/22/2022   CO2 25 02/22/2022   GLUCOSE 105 (H) 02/22/2022   BUN 30 (H) 02/22/2022   CREATININE 1.59 (H) 02/22/2022   CALCIUM 10.5 (H) 02/22/2022   PROT 7.0 02/22/2022   ALBUMIN 4.4 02/22/2022   AST 15 02/22/2022   ALT 11 02/22/2022   ALKPHOS 44 02/22/2022   BILITOT 0.7 02/22/2022   GFRNONAA 32 (L) 02/22/2022   GFRAA 42 (L) 04/08/2017     Medications: I have reviewed the patient's current medications.   Assessment/Plan: Normocytic anemia and leukocytosis 11/16/2021 ferritin 63; erythropoietin 10.4; B12 391; myeloma panel with 0.3 g/dL M spike with IFE showing IgA monoclonal protein with lambda light chain specificity; Kappa free light chains 47.8, lambda free light chains 58.5, normal ratio Hypertension Diabetes Chronic kidney disease History of thyroid cancer     Disposition: Ms. [name] appears stable.  She has a serum monoclonal IgA lambda  protein and mild elevation of the serum free light chains.  She has chronic renal insufficiency. I have a low clinical suspicion for multiple myeloma as the IgA level was stable in February and the IgG/IgM levels are normal.  She has stable EMEA neutrophilia.  She did not have a myeloma panel today.  She will return for an office visit, CBC, CMP, and myeloma panel in 3 months.  We discussed the plan to proceed with a diagnostic bone marrow biopsy if she develops a progressive hematologic abnormality or rise in the M protein. Thornton Papas, MD  05/24/2022  12:14 PM

## 2022-06-25 DIAGNOSIS — N1832 Chronic kidney disease, stage 3b: Secondary | ICD-10-CM | POA: Diagnosis not present

## 2022-07-01 DIAGNOSIS — N1832 Chronic kidney disease, stage 3b: Secondary | ICD-10-CM | POA: Diagnosis not present

## 2022-07-01 DIAGNOSIS — D631 Anemia in chronic kidney disease: Secondary | ICD-10-CM | POA: Diagnosis not present

## 2022-07-01 DIAGNOSIS — N2581 Secondary hyperparathyroidism of renal origin: Secondary | ICD-10-CM | POA: Diagnosis not present

## 2022-07-01 DIAGNOSIS — I129 Hypertensive chronic kidney disease with stage 1 through stage 4 chronic kidney disease, or unspecified chronic kidney disease: Secondary | ICD-10-CM | POA: Diagnosis not present

## 2022-07-06 ENCOUNTER — Ambulatory Visit: Payer: Medicare Other | Admitting: Podiatry

## 2022-07-23 ENCOUNTER — Ambulatory Visit (INDEPENDENT_AMBULATORY_CARE_PROVIDER_SITE_OTHER): Payer: Medicare Other | Admitting: Podiatry

## 2022-07-23 DIAGNOSIS — M79675 Pain in left toe(s): Secondary | ICD-10-CM

## 2022-07-23 DIAGNOSIS — M79674 Pain in right toe(s): Secondary | ICD-10-CM | POA: Diagnosis not present

## 2022-07-23 DIAGNOSIS — B351 Tinea unguium: Secondary | ICD-10-CM | POA: Diagnosis not present

## 2022-07-23 MED ORDER — DICLOFENAC SODIUM 1 % EX GEL: 2.0000 g | Freq: Four times a day (QID) | CUTANEOUS | 2 refills | Status: DC

## 2022-07-23 NOTE — Progress Notes (Signed)
       Subjective:  Patient ID: [name], female    DOB: July 05, 1940,  MRN: [id number]   [name] presents to clinic today for:  Chief Complaint  Patient presents with   Diabetes    [facility]  . Patient notes nails are thick, discolored, elongated and painful in shoegear when trying to ambulate.  Patient is requesting her old medication that Dr. Eloy End gave her in the past, be renewed.   PCP is [name], [name], MD.  Allergies  Allergen Reactions   Actos [Pioglitazone Hydrochloride] Swelling   Amlodipine Besylate Other (See Comments)    Taking in 2024   Cephalexin Other (See Comments)    Doesn't remember reaction   Lorazepam Other (See Comments)    Swelling    Oxycodone     This medication makes her feel sick   Oxycodone-Acetaminophen Nausea And Vomiting   Penicillin G Benzathine Other (See Comments)    Reaction not recalled   Rosiglitazone Other (See Comments)    Reaction not recalled   Tramadol Other (See Comments)    hallucinations   Tramadol Hcl Other (See Comments)    Hallucinations    Review of Systems: Negative except as noted in the HPI.  Objective:  There were no vitals filed for this visit.  [name] is a pleasant 82 y.o. female in NAD. AAO x 3.  Vascular Examination: Capillary refill time is 3-5 seconds to toes bilateral. Trace palpable pedal pulses b/l LE. Digital hair sparse b/l. + 1 pitting edema b/l. Skin temperature gradient WNL b/l. No varicosities b/l. No cyanosis or clubbing noted b/l.   Dermatological Examination: Pedal skin with normal turgor, texture and tone b/l. No open wounds. No interdigital macerations b/l. Toenails x10 are 3mm thick, discolored, dystrophic with subungual debris. There is pain with compression of the nail plates.  They are elongated x10  Assessment/Plan: 1. Pain due to onychomycosis of toenails of both feet     Meds ordered this encounter  Medications   diclofenac Sodium (VOLTAREN) 1 % GEL    Sig: Apply 2  g topically 4 (four) times daily. Rub into affected area of foot 2 to 4 times daily    Dispense:  100 g    Refill:  2   Reviewed EPIC system and saw that Dr. Eloy End wrote Rx for Voltaren gel for this patient in past.  This was resent today at the patient's request.   The mycotic toenails were sharply debrided x10 with sterile nail nippers and a power debriding burr to decrease bulk/thickness and length.    Return in about 3 months (around 10/23/2022) for [facility] w/ Dr. Eloy End only.   Clerance Lav, DPM, FACFAS [facility]     [address] [place], [place] [address]                Office [phone]  Fax [phone]

## 2022-07-29 ENCOUNTER — Encounter (HOSPITAL_BASED_OUTPATIENT_CLINIC_OR_DEPARTMENT_OTHER): Payer: Self-pay | Admitting: Emergency Medicine

## 2022-07-29 ENCOUNTER — Emergency Department (HOSPITAL_BASED_OUTPATIENT_CLINIC_OR_DEPARTMENT_OTHER)
Admission: EM | Admit: 2022-07-29 | Discharge: 2022-07-29 | Disposition: A | Discharge status: Home / Self Care | Payer: Medicare Other | Attending: Emergency Medicine | Admitting: Emergency Medicine

## 2022-07-29 ENCOUNTER — Other Ambulatory Visit: Payer: Self-pay

## 2022-07-29 ENCOUNTER — Emergency Department (HOSPITAL_BASED_OUTPATIENT_CLINIC_OR_DEPARTMENT_OTHER): Payer: Medicare Other

## 2022-07-29 DIAGNOSIS — E119 Type 2 diabetes mellitus without complications: Secondary | ICD-10-CM | POA: Diagnosis not present

## 2022-07-29 DIAGNOSIS — M79604 Pain in right leg: Secondary | ICD-10-CM | POA: Diagnosis not present

## 2022-07-29 DIAGNOSIS — Z7982 Long term (current) use of aspirin: Secondary | ICD-10-CM | POA: Insufficient documentation

## 2022-07-29 DIAGNOSIS — Z79899 Other long term (current) drug therapy: Secondary | ICD-10-CM | POA: Insufficient documentation

## 2022-07-29 DIAGNOSIS — I10 Essential (primary) hypertension: Secondary | ICD-10-CM | POA: Diagnosis not present

## 2022-07-29 DIAGNOSIS — R600 Localized edema: Secondary | ICD-10-CM | POA: Insufficient documentation

## 2022-07-29 DIAGNOSIS — Z794 Long term (current) use of insulin: Secondary | ICD-10-CM | POA: Insufficient documentation

## 2022-07-29 DIAGNOSIS — Z85850 Personal history of malignant neoplasm of thyroid: Secondary | ICD-10-CM | POA: Insufficient documentation

## 2022-07-29 DIAGNOSIS — M7989 Other specified soft tissue disorders: Secondary | ICD-10-CM | POA: Diagnosis not present

## 2022-07-29 DIAGNOSIS — D72829 Elevated white blood cell count, unspecified: Secondary | ICD-10-CM | POA: Diagnosis not present

## 2022-07-29 DIAGNOSIS — L03115 Cellulitis of right lower limb: Secondary | ICD-10-CM | POA: Insufficient documentation

## 2022-07-29 LAB — CBC WITH DIFFERENTIAL/PLATELET
Abs Immature Granulocytes: 0.05 10*3/uL (ref 0.00–0.07)
Basophils Absolute: 0.1 10*3/uL (ref 0.0–0.1)
Basophils Relative: 0 %
Eosinophils Absolute: 0.2 10*3/uL (ref 0.0–0.5)
Eosinophils Relative: 1 %
HCT: 30.6 % — ABNORMAL LOW (ref 36.0–46.0)
Hemoglobin: 10.1 g/dL — ABNORMAL LOW (ref 12.0–15.0)
Immature Granulocytes: 0 %
Lymphocytes Relative: 14 %
Lymphs Abs: 2 10*3/uL (ref 0.7–4.0)
MCH: 29.4 pg (ref 26.0–34.0)
MCHC: 33 g/dL (ref 30.0–36.0)
MCV: 89.2 fL (ref 80.0–100.0)
Monocytes Absolute: 0.8 10*3/uL (ref 0.1–1.0)
Monocytes Relative: 6 %
Neutro Abs: 10.7 10*3/uL — ABNORMAL HIGH (ref 1.7–7.7)
Neutrophils Relative %: 79 %
Platelets: 298 10*3/uL (ref 150–400)
RBC: 3.43 MIL/uL — ABNORMAL LOW (ref 3.87–5.11)
RDW: 12.7 % (ref 11.5–15.5)
WBC: 13.7 10*3/uL — ABNORMAL HIGH (ref 4.0–10.5)
nRBC: 0 % (ref 0.0–0.2)

## 2022-07-29 LAB — BASIC METABOLIC PANEL
Anion gap: 8 (ref 5–15)
BUN: 23 mg/dL (ref 8–23)
CO2: 30 mmol/L (ref 22–32)
Calcium: 9.8 mg/dL (ref 8.9–10.3)
Chloride: 101 mmol/L (ref 98–111)
Creatinine, Ser: 1.57 mg/dL — ABNORMAL HIGH (ref 0.44–1.00)
GFR, Estimated: 33 mL/min — ABNORMAL LOW (ref >60)
Glucose, Bld: 148 mg/dL — ABNORMAL HIGH (ref 70–99)
Potassium: 3.8 mmol/L (ref 3.5–5.1)
Sodium: 139 mmol/L (ref 135–145)

## 2022-07-29 NOTE — ED Triage Notes (Signed)
Pt presents to ED POV. Pt c/o R leg swelling and pain since Monday. Pt sent here by PCP d/t elevated d-dimer. Denies CP or SOB

## 2022-07-29 NOTE — ED Notes (Signed)
2 unsuccessful IV attempts, RAC, RT FA. PT tolerated well

## 2022-07-29 NOTE — ED Notes (Signed)
Discharge paperwork given and verbally understood. 

## 2022-07-29 NOTE — ED Provider Notes (Signed)
[place] EMERGENCY DEPARTMENT AT [facility] Provider Note   CSN: [id number] Arrival date & time: 07/29/22  1621     History  Chief Complaint  Patient presents with   Leg Swelling    [name] is a 82 y.o. female with history of thyroid cancer, renal disorder, hypertension, history of TIA, GERD, diabetes, atypical chest pain.  Patient presents to ED for evaluation of right leg swelling.  Patient reports that at some point last week she noticed that her right leg was more swollen than her left leg.  She reports that she often has chronic right greater than left leg swelling but reports that her right leg is more pronounced and not swelling today.  She states that the swelling to her right leg has progressed over the last week and 2 days ago she developed associated erythema to her right leg.  She was seen by her PCP today where she had a D-dimer drawn that was elevated.  The patient was sent here to rule out a DVT.  The patient was placed on antibiotics for suspected cellulitis prior to being sent to the ED.  She denies any recent chest pain, shortness of breath, fevers, nausea or vomiting.  Denies history of DVT/PE.  States that she is compliant on her Lasix which she takes as needed every other day.  HPI     Home Medications Prior to Admission medications   Medication Sig Start Date End Date Taking? Authorizing Provider  albuterol (VENTOLIN HFA) 108 (90 Base) MCG/ACT inhaler Inhale 2 puffs into the lungs every 6 (six) hours as needed for wheezing or shortness of breath. 05/28/20   [provider]  amLODipine (NORVASC) 5 MG tablet Take 5 mg by mouth daily. 08/07/19   [provider]  aspirin EC 81 MG tablet Take 81 mg by mouth daily. Swallow whole.    [provider]  atorvastatin (LIPITOR) 20 MG tablet Take 20 mg by mouth daily.  06/21/17   [provider]  B-D UF III MINI PEN NEEDLES 31G X 5 MM MISC Inject into the skin daily. 08/16/19    [provider]  Blood Glucose Monitoring Suppl (ONETOUCH VERIO REFLECT) w/Device KIT 2 (two) times daily. as directed 03/27/20   [provider]  Blood Pressure Monitor DEVI See admin instructions. 03/27/20   [provider]  calcitRIOL (ROCALTROL) 0.5 MCG capsule Take 1 mcg by mouth daily.    [provider]  calcium-vitamin D (OSCAL WITH D) 500-200 MG-UNIT per tablet Take 1 tablet by mouth 3 (three) times daily. Patient not taking: Reported on 05/24/2022    [provider]  clotrimazole-betamethasone (LOTRISONE) cream Apply 1 Application topically 2 (two) times daily as needed (to affected areas- for redness or swelling). 01/08/20   [provider]  diclofenac Sodium (VOLTAREN) 1 % GEL Apply 2 g topically 4 (four) times daily. Rub into affected area of foot 2 to 4 times daily 07/23/22   McCaughan, Dia D, DPM  EX-LAX 15 MG CHEW Chew 7.5 mg by mouth 2 (two) times a week.    [provider]  famotidine (PEPCID) 20 MG tablet Take 20 mg by mouth daily as needed for heartburn or indigestion.    [provider]  ferrous sulfate 325 (65 FE) MG tablet Take 325 mg by mouth every other day.    [provider]  furosemide (LASIX) 20 MG tablet Take 20 mg by mouth every other day.    [provider]  glucose blood (ONETOUCH VERIO) test strip See admin instructions. 03/27/20   [provider]  HYDROcodone-acetaminophen (NORCO/VICODIN) 5-325 MG tablet Take 0.5-1 tablets by mouth 2 (two) times daily as needed for moderate pain. Patient not taking: Reported on 05/24/2022 12/14/18   Merrilee Jansky, MD  hydrocortisone (ANUSOL-HC) 2.5 % rectal cream Place 1 Application rectally 2 (two) times daily as needed for anal itching or hemorrhoids. 02/12/19   [provider]  Insulin NPH, Human,, Isophane, (HUMULIN N KWIKPEN) 100 UNIT/ML Kiwkpen Inject 12 Units into the skin in the morning.    [provider]   levothyroxine (SYNTHROID) 137 MCG tablet Take 137 mcg by mouth daily before breakfast.    [provider]  lisinopril (ZESTRIL) 20 MG tablet Take 20 mg by mouth daily. 01/19/21   [provider]  mometasone (ELOCON) 0.1 % ointment Apply 1 application  topically daily as needed (for irritation- under skin folds). 07/30/19   [provider]  NON FORMULARY [facility]  Creams-#11    [provider]  nystatin ointment (MYCOSTATIN) Apply 1 application topically 2 (two) times daily as needed for itching. 03/09/17   [provider]  OneTouch Delica Lancets 30G MISC See admin instructions. 03/27/20   [provider]  pantoprazole (PROTONIX) 40 MG tablet Take 40 mg by mouth daily before breakfast. Patient not taking: Reported on 05/24/2022 12/19/18   [provider]  pregabalin (LYRICA) 100 MG capsule Take 1 capsule (100 mg total) by mouth every 8 (eight) hours. Patient not taking: Reported on 05/24/2022 05/12/17   Sharee Holster, NP  pregabalin (LYRICA) 100 MG capsule Take 100 mg by mouth 3 (three) times daily. 02/01/20   [provider]  RESTASIS 0.05 % ophthalmic emulsion Place 1 drop into both eyes 2 (two) times daily. 02/01/20   [provider]  SYSTANE COMPLETE PF 0.6 % SOLN Place 1 drop into both eyes 2 (two) times daily as needed (for dryness).    [provider]  TYLENOL 500 MG tablet Take 1,000 mg by mouth See admin instructions. Take 1,000 mg by mouth three times a day- when not taking Norco 5/325    [provider]  valACYclovir (VALTREX) 1000 MG tablet Take 1,000 mg by mouth every 12 (twelve) hours as needed (as directed- "for flares of anal sores").    [provider]  escitalopram (LEXAPRO) 10 MG tablet Take 10 mg by mouth daily.    06/28/10  [provider]      Allergies    Actos [pioglitazone hydrochloride], Amlodipine besylate, Cephalexin, Lorazepam, Oxycodone,  Oxycodone-acetaminophen, Penicillin g benzathine, Rosiglitazone, Tramadol, and Tramadol hcl    Review of Systems   Review of Systems  Constitutional:  Negative for fever.  Respiratory:  Negative for shortness of breath.   Cardiovascular:  Positive for leg swelling. Negative for chest pain.  Skin:  Positive for color change.  All other systems reviewed and are negative.   Physical Exam Updated Vital Signs BP (!) 148/64   Pulse 66   Temp 98.1 F (36.7 C) (Oral)   SpO2 98%  Physical Exam Vitals and nursing note reviewed.  Constitutional:      General: She is not in acute distress.    Appearance: Normal appearance. She is not ill-appearing, toxic-appearing or diaphoretic.  HENT:     Head: Normocephalic and atraumatic.     Nose: Nose normal.     Mouth/Throat:     Mouth: Mucous membranes are moist.  Pharynx: Oropharynx is clear.  Eyes:     Extraocular Movements: Extraocular movements intact.     Conjunctiva/sclera: Conjunctivae normal.     Pupils: Pupils are equal, round, and reactive to light.  Cardiovascular:     Rate and Rhythm: Normal rate and regular rhythm.  Pulmonary:     Effort: Pulmonary effort is normal.     Breath sounds: Normal breath sounds. No wheezing.  Abdominal:     General: Abdomen is flat. Bowel sounds are normal.     Palpations: Abdomen is soft.     Tenderness: There is no abdominal tenderness.  Musculoskeletal:     Cervical back: Normal range of motion and neck supple. No tenderness.     Right lower leg: Edema present.     Left lower leg: Edema present.     Comments: 1+ pitting edema to right lower extremity.  Skin:    General: Skin is warm and dry.     Capillary Refill: Capillary refill takes less than 2 seconds.     Comments: Erythema to right lower extremity consistent with cellulitis.  No abscess needed drainage, no induration, no fluctuance  Neurological:     Mental Status: She is alert and oriented to person, place, and time.     ED  Results / Procedures / Treatments   Labs (all labs ordered are listed, but only abnormal results are displayed) Labs Reviewed  CBC WITH DIFFERENTIAL/PLATELET - Abnormal; Notable for the following components:      Result Value   WBC 13.7 (*)    RBC 3.43 (*)    Hemoglobin 10.1 (*)    HCT 30.6 (*)    Neutro Abs 10.7 (*)    All other components within normal limits  BASIC METABOLIC PANEL - Abnormal; Notable for the following components:   Glucose, Bld 148 (*)    Creatinine, Ser 1.57 (*)    GFR, Estimated 33 (*)    All other components within normal limits    EKG EKG Interpretation Date/Time:  Thursday July 29 2022 16:37:03 EDT Ventricular Rate:  64 PR Interval:  212 QRS Duration:  100 QT Interval:  405 QTC Calculation: 418 R Axis:   -18  Text Interpretation: Sinus rhythm Borderline prolonged PR interval Borderline left axis deviation Low voltage, precordial leads No significant change since last tracing Confirmed by Elayne Snare ([id number]) on 07/29/2022 4:48:11 PM  Radiology US Venous Img Lower Right (DVT Study)  Result Date: 07/29/2022 CLINICAL DATA:  Right lower extremity swelling EXAM: RIGHT LOWER EXTREMITY VENOUS DOPPLER ULTRASOUND TECHNIQUE: Gray-scale sonography with compression, as well as color and duplex ultrasound, were performed to evaluate the deep venous system(s) from the level of the common femoral vein through the popliteal and proximal calf veins. COMPARISON:  None Available. FINDINGS: VENOUS Normal compressibility of the common femoral, superficial femoral, and popliteal veins, as well as the visualized calf veins. Visualized portions of profunda femoral vein and great saphenous vein unremarkable. No filling defects to suggest DVT on grayscale or color Doppler imaging. Doppler waveforms show normal direction of venous flow, normal respiratory plasticity and response to augmentation. Limited views of the contralateral common femoral vein are unremarkable. OTHER  Irregular fluid collection of the right anteromedial mid calf measuring 2.2 x 2.9 x 0.5 cm. Limitations: none IMPRESSION: 1. No evidence of right lower extremity DVT. 2. Irregular fluid collection of the right anteromedial mid calf, could be due to abscess or hematoma. Recommend clinical correlation. Electronically Signed   By: Aliene Altes.D.  On: 07/29/2022 18:19    Procedures Procedures   Medications Ordered in ED Medications - No data to display  ED Course/ Medical Decision Making/ A&P  Medical Decision Making Amount and/or Complexity of Data Reviewed Labs: ordered.   82 year old female presents to the ED for evaluation.  Please see HPI for further details.  On examination the patient is afebrile and nontachycardic.  Her lung sounds are clear bilaterally and she is nonhypoxic.  Her abdomen is soft and compressible throughout.  Right lower extremity does have erythema to anterior shin consistent with cellulitis.  No pain to calf.  2+ DP pulse.  The patient does have 1+ pitting edema to her right lower extremity.  Patient CBC shows leukocytosis to 13.7 however patient always seems to have white blood cell count elevation when she comes in.  The patient hemoglobin is baseline.  Patient metabolic panel shows a baseline creatinine 1.57, no other electrolyte derangement, anion gap 8.  Patient ultrasound imaging of right lower extremity shows no evidence of DVT.  There is an irregular fluid collection of the right anterior medial mid calf which could be due to abscess or hematoma.  Clinical inspection shows no area consistent with abscess.  Please see picture for further details.  I favor hematoma over abscess in this setting.  Patient was placed on doxycycline at PCPs office.  Patient DVT study here unremarkable and negative for DVT.  Patient did have abnormal fluid collection which could be abscess versus hematoma however clinical presentation favors hematoma.  Patient was advised to  continue monitoring the site, take antibiotics.  She was advised to follow-up with her PCP on Monday as previously scheduled.  She was advised to return to the ED with any new or worsening signs or symptoms over the weekend such as drainage from leg, fevers, body aches or chills.  The patient voiced understanding.  Patient will follow-up with her PCP on Monday.  She will return to the ED with any new symptoms.  Patient discharged in stable condition.   Final Clinical Impression(s) / ED Diagnoses Final diagnoses:  Cellulitis of right lower extremity    Rx / DC Orders ED Discharge Orders     None         Al Decant, PA-C 07/29/22 2023    Elayne Snare K, DO 07/29/22 2333

## 2022-07-29 NOTE — ED Notes (Signed)
Spoke to pt about transport as well as [facility] about what services they have available for getting pt home. [facility] is able to get [employer] services to come get pt but pt sts she is not able to pay for this. Pt is agreeable to getting cab ride back to her residence but verbalized understanding that cab service is not affiliated with ED and they are not able to assist her into her residence. She sts that [facility] comes to transport her and she is able to get into/out of car without assistance and is able to get into her home without assistance. Pt is unable to get anyone in her family to come and get her due to time of day and their work schedules. Walked pt with walker, which is her baseline, to determine if she still feels comfortable to go home. Pt was able to get out of bed without assistance and walk to the bathroom with walker per medic Josh. Pt is comfortable getting cab ride home.

## 2022-07-29 NOTE — ED Notes (Signed)
Report given to the next RN... 

## 2022-07-29 NOTE — Discharge Instructions (Addendum)
It was a pleasure taking part in your care today.  As discussed, your ultrasound imaging was negative for blood clot in your leg.  You do have cellulitis of your right leg which is an infection of the skin.  Your PCP placed on antibiotics which she will take for the next 7 days twice daily.  If over the weekend to begin developing fevers, bodyaches or chills, nausea or vomiting please return to the ED for further management.  The ultrasound image did show a possible area of fluid collection in your leg however we favor this to represent hematoma.  Please follow-up with your PCP on Monday for evaluation.  Please read the attached guide concerning cellulitis.

## 2022-08-18 DIAGNOSIS — L03115 Cellulitis of right lower limb: Secondary | ICD-10-CM | POA: Diagnosis not present

## 2022-08-18 DIAGNOSIS — Z9989 Dependence on other enabling machines and devices: Secondary | ICD-10-CM | POA: Diagnosis not present

## 2022-08-19 DIAGNOSIS — C73 Malignant neoplasm of thyroid gland: Secondary | ICD-10-CM | POA: Diagnosis not present

## 2022-08-19 DIAGNOSIS — E892 Postprocedural hypoparathyroidism: Secondary | ICD-10-CM | POA: Diagnosis not present

## 2022-08-19 DIAGNOSIS — E890 Postprocedural hypothyroidism: Secondary | ICD-10-CM | POA: Diagnosis not present

## 2022-08-19 DIAGNOSIS — E1165 Type 2 diabetes mellitus with hyperglycemia: Secondary | ICD-10-CM | POA: Diagnosis not present

## 2022-08-24 ENCOUNTER — Inpatient Hospital Stay: Payer: Medicare Other | Admitting: Nurse Practitioner

## 2022-08-24 ENCOUNTER — Inpatient Hospital Stay: Payer: Medicare Other

## 2022-08-25 DIAGNOSIS — E1165 Type 2 diabetes mellitus with hyperglycemia: Secondary | ICD-10-CM | POA: Diagnosis not present

## 2022-08-25 DIAGNOSIS — C73 Malignant neoplasm of thyroid gland: Secondary | ICD-10-CM | POA: Diagnosis not present

## 2022-08-25 DIAGNOSIS — E892 Postprocedural hypoparathyroidism: Secondary | ICD-10-CM | POA: Diagnosis not present

## 2022-08-25 DIAGNOSIS — I10 Essential (primary) hypertension: Secondary | ICD-10-CM | POA: Diagnosis not present

## 2022-08-25 DIAGNOSIS — N189 Chronic kidney disease, unspecified: Secondary | ICD-10-CM | POA: Diagnosis not present

## 2022-08-25 DIAGNOSIS — E890 Postprocedural hypothyroidism: Secondary | ICD-10-CM | POA: Diagnosis not present

## 2022-08-26 ENCOUNTER — Inpatient Hospital Stay: Payer: Medicare Other

## 2022-08-26 ENCOUNTER — Ambulatory Visit: Payer: Medicare Other | Admitting: Nurse Practitioner

## 2022-08-26 DIAGNOSIS — J209 Acute bronchitis, unspecified: Secondary | ICD-10-CM | POA: Diagnosis not present

## 2022-08-26 DIAGNOSIS — Z03818 Encounter for observation for suspected exposure to other biological agents ruled out: Secondary | ICD-10-CM | POA: Diagnosis not present

## 2022-08-26 DIAGNOSIS — J069 Acute upper respiratory infection, unspecified: Secondary | ICD-10-CM | POA: Diagnosis not present

## 2022-08-26 DIAGNOSIS — R051 Acute cough: Secondary | ICD-10-CM | POA: Diagnosis not present

## 2022-08-26 DIAGNOSIS — U071 COVID-19: Secondary | ICD-10-CM | POA: Diagnosis not present

## 2022-08-31 ENCOUNTER — Inpatient Hospital Stay: Payer: Medicare Other | Admitting: Nurse Practitioner

## 2022-08-31 ENCOUNTER — Inpatient Hospital Stay: Payer: Medicare Other

## 2022-09-16 ENCOUNTER — Inpatient Hospital Stay: Payer: Medicare Other | Admitting: Nurse Practitioner

## 2022-09-16 ENCOUNTER — Inpatient Hospital Stay: Payer: Medicare Other

## 2022-09-23 ENCOUNTER — Inpatient Hospital Stay: Payer: Medicare Other | Admitting: Nurse Practitioner

## 2022-09-23 ENCOUNTER — Inpatient Hospital Stay: Payer: Medicare Other

## 2022-09-30 DIAGNOSIS — R300 Dysuria: Secondary | ICD-10-CM | POA: Diagnosis not present

## 2022-09-30 DIAGNOSIS — R21 Rash and other nonspecific skin eruption: Secondary | ICD-10-CM | POA: Diagnosis not present

## 2022-10-05 ENCOUNTER — Other Ambulatory Visit: Payer: Medicare Other

## 2022-10-05 ENCOUNTER — Ambulatory Visit: Payer: Medicare Other | Admitting: Nurse Practitioner

## 2022-10-08 ENCOUNTER — Inpatient Hospital Stay: Payer: Medicare Other

## 2022-10-08 ENCOUNTER — Inpatient Hospital Stay: Payer: Medicare Other | Admitting: Nurse Practitioner

## 2022-10-27 ENCOUNTER — Ambulatory Visit: Payer: Medicare Other | Admitting: Podiatry

## 2022-11-02 ENCOUNTER — Inpatient Hospital Stay (HOSPITAL_BASED_OUTPATIENT_CLINIC_OR_DEPARTMENT_OTHER): Payer: Medicare Other | Admitting: Nurse Practitioner

## 2022-11-02 ENCOUNTER — Encounter: Payer: Self-pay | Admitting: Nurse Practitioner

## 2022-11-02 ENCOUNTER — Inpatient Hospital Stay: Payer: Medicare Other | Attending: Oncology

## 2022-11-02 VITALS — BP 150/58 | HR 95 | Temp 98.1°F | Resp 20 | Ht 64.0 in | Wt 234.0 lb

## 2022-11-02 DIAGNOSIS — Z85850 Personal history of malignant neoplasm of thyroid: Secondary | ICD-10-CM | POA: Diagnosis not present

## 2022-11-02 DIAGNOSIS — D72829 Elevated white blood cell count, unspecified: Secondary | ICD-10-CM | POA: Diagnosis not present

## 2022-11-02 DIAGNOSIS — E119 Type 2 diabetes mellitus without complications: Secondary | ICD-10-CM | POA: Insufficient documentation

## 2022-11-02 DIAGNOSIS — Z794 Long term (current) use of insulin: Secondary | ICD-10-CM | POA: Diagnosis not present

## 2022-11-02 DIAGNOSIS — D649 Anemia, unspecified: Secondary | ICD-10-CM | POA: Diagnosis not present

## 2022-11-02 DIAGNOSIS — Z7982 Long term (current) use of aspirin: Secondary | ICD-10-CM | POA: Insufficient documentation

## 2022-11-02 DIAGNOSIS — I10 Essential (primary) hypertension: Secondary | ICD-10-CM | POA: Diagnosis not present

## 2022-11-02 DIAGNOSIS — Z79899 Other long term (current) drug therapy: Secondary | ICD-10-CM | POA: Insufficient documentation

## 2022-11-02 LAB — CBC WITH DIFFERENTIAL (CANCER CENTER ONLY)
Abs Immature Granulocytes: 0.03 10*3/uL (ref 0.00–0.07)
Basophils Absolute: 0 10*3/uL (ref 0.0–0.1)
Basophils Relative: 0 %
Eosinophils Absolute: 0.2 10*3/uL (ref 0.0–0.5)
Eosinophils Relative: 2 %
HCT: 29.7 % — ABNORMAL LOW (ref 36.0–46.0)
Hemoglobin: 9.8 g/dL — ABNORMAL LOW (ref 12.0–15.0)
Immature Granulocytes: 0 %
Lymphocytes Relative: 18 %
Lymphs Abs: 1.7 10*3/uL (ref 0.7–4.0)
MCH: 29.3 pg (ref 26.0–34.0)
MCHC: 33 g/dL (ref 30.0–36.0)
MCV: 88.9 fL (ref 80.0–100.0)
Monocytes Absolute: 0.6 10*3/uL (ref 0.1–1.0)
Monocytes Relative: 7 %
Neutro Abs: 6.8 10*3/uL (ref 1.7–7.7)
Neutrophils Relative %: 73 %
Platelet Count: 295 10*3/uL (ref 150–400)
RBC: 3.34 MIL/uL — ABNORMAL LOW (ref 3.87–5.11)
RDW: 13.6 % (ref 11.5–15.5)
WBC Count: 9.3 10*3/uL (ref 4.0–10.5)
nRBC: 0 % (ref 0.0–0.2)

## 2022-11-02 LAB — CMP (CANCER CENTER ONLY)
ALT: 12 U/L (ref 0–44)
AST: 16 U/L (ref 15–41)
Albumin: 4.4 g/dL (ref 3.5–5.0)
Alkaline Phosphatase: 50 U/L (ref 38–126)
Anion gap: 9 (ref 5–15)
BUN: 26 mg/dL — ABNORMAL HIGH (ref 8–23)
CO2: 27 mmol/L (ref 22–32)
Calcium: 9.9 mg/dL (ref 8.9–10.3)
Chloride: 101 mmol/L (ref 98–111)
Creatinine: 1.38 mg/dL — ABNORMAL HIGH (ref 0.44–1.00)
GFR, Estimated: 38 mL/min — ABNORMAL LOW (ref >60)
Glucose, Bld: 156 mg/dL — ABNORMAL HIGH (ref 70–99)
Potassium: 3.7 mmol/L (ref 3.5–5.1)
Sodium: 137 mmol/L (ref 135–145)
Total Bilirubin: 0.8 mg/dL (ref 0.3–1.2)
Total Protein: 6.9 g/dL (ref 6.5–8.1)

## 2022-11-02 NOTE — Progress Notes (Signed)
  [facility] OFFICE PROGRESS NOTE   Diagnosis: Anemia, leukocytosis  INTERVAL HISTORY:   [name] returns for follow-up.  She reports a good appetite and good energy level.  She likes drinking Ensure but worries that this may be affecting her blood sugar.  No recent infections.  No fevers or sweats.  She denies bleeding.  Objective:  Vital signs in last 24 hours:  Blood pressure (!) 150/58, pulse 95, temperature 98.1 F (36.7 C), temperature source Temporal, resp. rate 20, height 5\' 4"  (1.626 m), weight 234 lb (106.1 kg), SpO2 97%.    Lymphatics: No palpable cervical, supraclavicular, axillary or inguinal lymph nodes. Resp: Lungs clear bilaterally. Cardio: Regular rate and rhythm. GI: No hepatosplenomegaly. Vascular: Trace edema lower leg bilaterally.   Lab Results:  Lab Results  Component Value Date   WBC 9.3 11/02/2022   HGB 9.8 (L) 11/02/2022   HCT 29.7 (L) 11/02/2022   MCV 88.9 11/02/2022   PLT 295 11/02/2022   NEUTROABS 6.8 11/02/2022    Imaging:  No results found.  Medications: I have reviewed the patient's current medications.  Assessment/Plan: Normocytic anemia and leukocytosis 11/16/2021 ferritin 63; erythropoietin 10.4; B12 391; myeloma panel with 0.3 g/dL M spike with IFE showing IgA monoclonal protein with lambda light chain specificity; Kappa free light chains 47.8, lambda free light chains 58.5, normal ratio Hypertension Diabetes Chronic kidney disease History of thyroid cancer  Disposition: [name] appears unchanged.  Hemoglobin is stable, white count is in normal range.  Myeloma panel is pending.  We will contact her once results are available.  Plan for continued observation of blood counts.  She will return for lab and follow-up in approximately 4 months.    Referral made to the Cancer Center dietitian to assist her with choice of nutritional supplements.    Lonna Cobb ANP/GNP-BC   11/02/2022  2:04 PM

## 2022-11-03 ENCOUNTER — Inpatient Hospital Stay: Payer: Medicare Other | Admitting: Nutrition

## 2022-11-03 LAB — PROTEIN ELECTROPHORESIS, SERUM
A/G Ratio: 1.3 (ref 0.7–1.7)
Albumin ELP: 3.9 g/dL (ref 2.9–4.4)
Alpha-1-Globulin: 0.2 g/dL (ref 0.0–0.4)
Alpha-2-Globulin: 0.8 g/dL (ref 0.4–1.0)
Beta Globulin: 0.9 g/dL (ref 0.7–1.3)
Gamma Globulin: 1 g/dL (ref 0.4–1.8)
Globulin, Total: 3 g/dL (ref 2.2–3.9)
M-Spike, %: 0.3 g/dL — ABNORMAL HIGH
Total Protein ELP: 6.9 g/dL (ref 6.0–8.5)

## 2022-11-03 LAB — KAPPA/LAMBDA LIGHT CHAINS
Kappa free light chain: 32.7 mg/L — ABNORMAL HIGH (ref 3.3–19.4)
Kappa, lambda light chain ratio: 0.56 (ref 0.26–1.65)
Lambda free light chains: 58.6 mg/L — ABNORMAL HIGH (ref 5.7–26.3)

## 2022-11-03 NOTE — Progress Notes (Signed)
82 yo female diagnosed with anemia and leukocytosis. She is followed by Dr. Truett Perna. She does not receive any treatment.  Consult received to assist with ONS choice and DM.  PMH includes HTN, DM, CKD, Thyroid cancer, GERD, TIA, HLD, and OA.  Medications include Calcium/D, Exlax, Pepcid, Ferrous Sulfate, Lasix, Insulin, Synthroid, Protonix.  Labs include Glucose 156, BUN 26 and Creatinine 1.38.  Height: 64 inches. Weight: 234 pounds. UBW: Weighed 240 pounds January 2024 and 243 pounds May 2024. BMI: 40.17  Reports good appetite and energy levels. Noted trace bilateral LLE at MD visit. Reports she likes drinking Ensure because it gives her energy. She drinks one to two bottles daily. She would like to know if their is a better nutrition shake for her DM.  Nutrition Diagnosis: Food and Nutrition Related Knowledge Deficit related to diabetes as evidenced by questions regarding supplements.  Intervention: Educated to change to [employer]. Assisted with purchasing information. Mailed coupons to home address.  Monitoring, Evaluation, Goals: Tolerate adequate calories and protein with acceptable glycemic control  No follow up scheduled.

## 2022-11-05 LAB — MULTIPLE MYELOMA PANEL, SERUM
Albumin SerPl Elph-Mcnc: 4.1 g/dL (ref 2.9–4.4)
Albumin/Glob SerPl: 1.6 (ref 0.7–1.7)
Alpha 1: 0.2 g/dL (ref 0.0–0.4)
Alpha2 Glob SerPl Elph-Mcnc: 0.7 g/dL (ref 0.4–1.0)
B-Globulin SerPl Elph-Mcnc: 0.8 g/dL (ref 0.7–1.3)
Gamma Glob SerPl Elph-Mcnc: 0.9 g/dL (ref 0.4–1.8)
Globulin, Total: 2.7 g/dL (ref 2.2–3.9)
IgA: 401 mg/dL (ref 64–422)
IgG (Immunoglobin G), Serum: 789 mg/dL (ref 586–1602)
IgM (Immunoglobulin M), Srm: 80 mg/dL (ref 26–217)
M Protein SerPl Elph-Mcnc: 0.3 g/dL — ABNORMAL HIGH
Total Protein ELP: 6.8 g/dL (ref 6.0–8.5)

## 2022-11-08 ENCOUNTER — Telehealth: Payer: Self-pay

## 2022-11-08 NOTE — Telephone Encounter (Signed)
Patient gave verbal understanding and had no further questions or concerns  

## 2022-11-08 NOTE — Telephone Encounter (Signed)
-----   Message from Lonna Cobb sent at 11/08/2022  2:19 PM EST ----- Please let her know labs are stable.  Follow-up as scheduled.

## 2022-11-17 ENCOUNTER — Ambulatory Visit: Payer: Medicare Other | Admitting: Podiatry

## 2022-11-19 ENCOUNTER — Ambulatory Visit (INDEPENDENT_AMBULATORY_CARE_PROVIDER_SITE_OTHER): Payer: Medicare Other | Admitting: Podiatry

## 2022-11-19 ENCOUNTER — Encounter: Payer: Self-pay | Admitting: Podiatry

## 2022-11-19 DIAGNOSIS — M79675 Pain in left toe(s): Secondary | ICD-10-CM

## 2022-11-19 DIAGNOSIS — B351 Tinea unguium: Secondary | ICD-10-CM

## 2022-11-19 DIAGNOSIS — M79674 Pain in right toe(s): Secondary | ICD-10-CM | POA: Diagnosis not present

## 2022-11-19 MED ORDER — DICLOFENAC SODIUM 1 % EX GEL: 2.0000 g | Freq: Four times a day (QID) | CUTANEOUS | 2 refills | Status: AC

## 2022-11-21 NOTE — Progress Notes (Signed)
Subjective:   Patient ID: [name], female   DOB: 82 y.o.   MRN: [id number]   HPI Patient presents with elongated thickened nailbeds 1-5 both feet   ROS      Objective:  Physical Exam  Neurovascular status intact with thick yellow brittle nailbeds 1-5 both feet     Assessment:  Mycotic nail infection with pain 1-5 both feet     Plan:  Debridement painful nailbeds 1-5 both feet no iatrogenic bleeding

## 2022-11-30 DIAGNOSIS — N1832 Chronic kidney disease, stage 3b: Secondary | ICD-10-CM | POA: Diagnosis not present

## 2022-12-03 DIAGNOSIS — N189 Chronic kidney disease, unspecified: Secondary | ICD-10-CM | POA: Diagnosis not present

## 2022-12-03 DIAGNOSIS — I129 Hypertensive chronic kidney disease with stage 1 through stage 4 chronic kidney disease, or unspecified chronic kidney disease: Secondary | ICD-10-CM | POA: Diagnosis not present

## 2022-12-03 DIAGNOSIS — D631 Anemia in chronic kidney disease: Secondary | ICD-10-CM | POA: Diagnosis not present

## 2022-12-03 DIAGNOSIS — N2581 Secondary hyperparathyroidism of renal origin: Secondary | ICD-10-CM | POA: Diagnosis not present

## 2022-12-03 DIAGNOSIS — N1832 Chronic kidney disease, stage 3b: Secondary | ICD-10-CM | POA: Diagnosis not present

## 2022-12-27 ENCOUNTER — Encounter: Payer: Self-pay | Admitting: Nephrology

## 2023-02-12 DIAGNOSIS — M545 Low back pain, unspecified: Secondary | ICD-10-CM | POA: Diagnosis not present

## 2023-02-24 ENCOUNTER — Other Ambulatory Visit: Payer: Self-pay | Admitting: *Deleted

## 2023-02-24 DIAGNOSIS — D649 Anemia, unspecified: Secondary | ICD-10-CM

## 2023-02-24 DIAGNOSIS — D72829 Elevated white blood cell count, unspecified: Secondary | ICD-10-CM

## 2023-02-28 ENCOUNTER — Inpatient Hospital Stay: Payer: Medicare Other | Admitting: Oncology

## 2023-02-28 ENCOUNTER — Other Ambulatory Visit: Payer: Medicare Other

## 2023-02-28 ENCOUNTER — Inpatient Hospital Stay: Payer: Medicare Other

## 2023-03-18 DIAGNOSIS — M542 Cervicalgia: Secondary | ICD-10-CM | POA: Diagnosis not present

## 2023-03-24 DIAGNOSIS — M542 Cervicalgia: Secondary | ICD-10-CM | POA: Diagnosis not present

## 2023-03-30 ENCOUNTER — Encounter: Payer: Self-pay | Admitting: Podiatry

## 2023-03-30 ENCOUNTER — Ambulatory Visit (INDEPENDENT_AMBULATORY_CARE_PROVIDER_SITE_OTHER): Payer: Medicare Other | Admitting: Podiatry

## 2023-03-30 VITALS — Ht 64.0 in | Wt 234.0 lb

## 2023-03-30 DIAGNOSIS — B351 Tinea unguium: Secondary | ICD-10-CM | POA: Diagnosis not present

## 2023-03-30 DIAGNOSIS — M2141 Flat foot [pes planus] (acquired), right foot: Secondary | ICD-10-CM | POA: Diagnosis not present

## 2023-03-30 DIAGNOSIS — M79674 Pain in right toe(s): Secondary | ICD-10-CM | POA: Diagnosis not present

## 2023-03-30 DIAGNOSIS — M2010 Hallux valgus (acquired), unspecified foot: Secondary | ICD-10-CM | POA: Diagnosis not present

## 2023-03-30 DIAGNOSIS — E119 Type 2 diabetes mellitus without complications: Secondary | ICD-10-CM

## 2023-03-30 DIAGNOSIS — M79675 Pain in left toe(s): Secondary | ICD-10-CM

## 2023-03-30 DIAGNOSIS — E1142 Type 2 diabetes mellitus with diabetic polyneuropathy: Secondary | ICD-10-CM | POA: Diagnosis not present

## 2023-03-30 DIAGNOSIS — M2142 Flat foot [pes planus] (acquired), left foot: Secondary | ICD-10-CM | POA: Diagnosis not present

## 2023-04-03 NOTE — Progress Notes (Signed)
 ANNUAL DIABETIC FOOT EXAM  Subjective: [name] presents today for annual diabetic foot exam.  Chief Complaint  Patient presents with   Nail Problem    Pt is here for [facility] unsure of last A1C PCP is Dr [name] and LOV was 6 months ago.   Patient confirms h/o diabetes.  Patient denies any h/o foot wounds.  Patient has been diagnosed with neuropathy.  [name], [name], MD is patient's PCP.  Past Medical History:  Diagnosis Date   Anemia    Asthma    Chest pain, atypical    Diabetes mellitus    GERD (gastroesophageal reflux disease)    History of transient ischemic attack (TIA)    Hyperlipidemia    Hypertension    OA (osteoarthritis)    Renal disorder    CKD   Thyroid carcinoma [facility])    Patient Active Problem List   Diagnosis Date Noted   Hypocalcemia 10/24/2020   Malignant tumor of thyroid gland (HCC) 10/24/2020   Age-related osteoporosis without current pathological fracture 06/13/2020   CVA, old, speech/language deficit 06/13/2020   Generalized abdominal pain 06/13/2020   Long term (current) use of insulin (HCC) 06/13/2020   Osteoporosis without current pathological fracture 06/13/2020   Pure hypercholesterolemia 06/13/2020   Pain in joint of right shoulder 06/26/2019   Iron deficiency anemia, unspecified 03/14/2018   Chronic pain syndrome 11/04/2017   Thoracic radiculopathy 11/04/2017   Pes anserinus bursitis of right knee 08/12/2017   GERD without esophagitis 05/12/2017   Pleuritic chest pain 05/04/2017   Medication monitoring encounter 05/04/2017   Dyslipidemia associated with type 2 diabetes mellitus (HCC) 04/20/2017   Hypertension associated with diabetes (HCC) 04/20/2017   Cerebrovascular accident (CVA) with involvement of right side of body (HCC) 04/20/2017   Peripheral autonomic neuropathy due to diabetes mellitus (HCC) 04/20/2017   Hypertensive renal disease with renal failure, stage 1-4 or unspecified chronic kidney disease 04/20/2017    Constipation 04/06/2017   Status post lumbar spinal fusion 04/03/2017   Recurrent UTI 04/03/2017   Chronic renal insufficiency 04/03/2017   Normocytic anemia 04/03/2017   Osteomyelitis of thoracic spine (HCC) 04/02/2017   Thoracic discitis 04/02/2017   DDD (degenerative disc disease), lumbar 12/31/2016   Narcotic dependence (HCC) 10/24/2015   Periprosthetic fracture around internal prosthetic right knee joint 04/25/2015   Closed fracture of distal end of right femur, initial encounter (HCC) 04/24/2015   Closed fracture of distal end of right femur, initial encounter (HCC) 04/24/2015   Acute gout 10/17/2013   Hypoparathyroidism (HCC) 03/02/2012   Benign essential tremor 09/28/2010   Combined hyperlipidemia associated with type 2 diabetes mellitus (HCC) 09/26/2009   Osteopenia 09/26/2009   Postoperative hypothyroidism 09/26/2009   Multiple pulmonary nodules 08/22/2009   OSTEOARTHRITIS, KNEES, BILATERAL 08/07/2009   Allergic rhinitis 11/19/2008   CVA, old, hemiparesis (HCC) 10/16/2008   History of thyroid cancer 09/26/2008   Spinal stenosis of lumbar region 03/26/2008   Hypothyroidism 07/03/2007   Insulin dependent type 2 diabetes mellitus, controlled (HCC) 07/03/2007   Spinal epidural abscess 07/03/2007   Benign essential HTN 07/03/2007   PSOAS MUSCLE ABSCESS 06/09/2007   Past Surgical History:  Procedure Laterality Date   BACK SURGERY     BIOPSY  03/14/2018   Procedure: BIOPSY;  Surgeon: Charlott Rakes, MD;  Location: [facility];  Service: Endoscopy;;  EGD and Colon   BREAST BIOPSY     CARDIAC CATHETERIZATION  03/18/2009   NORMAL LEFT VENTRICULAR SIZE AND CONTRACTILITY WITH NORMAL SYSTOLIC  FUNCTION. EF 60%  CARDIOLITE STUDY     SHOWED A SIGNIFICANT REVERSIBLE ANTERIOR WALL DEFECT CONSISTENT WITH ISCHEMIA. EF 55%   COLONOSCOPY WITH PROPOFOL N/A 03/14/2018   Procedure: COLONOSCOPY WITH PROPOFOL;  Surgeon: Charlott Rakes, MD;  Location: [facility];  Service: Endoscopy;   Laterality: N/A;   ESOPHAGOGASTRODUODENOSCOPY (EGD) WITH PROPOFOL N/A 03/14/2018   Procedure: ESOPHAGOGASTRODUODENOSCOPY (EGD) WITH PROPOFOL;  Surgeon: Charlott Rakes, MD;  Location: [facility];  Service: Endoscopy;  Laterality: N/A;   FEMUR IM NAIL Right 04/25/2015   Procedure: INTRAMEDULLARY (IM) RIGHT  RETROGRADE FEMORAL NAILING;  Surgeon: Samson Frederic, MD;  Location: [facility];  Service: Orthopedics;  Laterality: Right;   IR FL GUIDED LOC OF NEEDLE/CATH TIP FOR SPINAL INJECTION RT  04/05/2017   IR FLUORO GUIDE CV LINE RIGHT  04/08/2017   IR US GUIDE VASC ACCESS RIGHT  04/08/2017   LUMBAR FUSION     POLYPECTOMY  03/14/2018   Procedure: POLYPECTOMY;  Surgeon: Charlott Rakes, MD;  Location: [facility];  Service: Endoscopy;;   THYROIDECTOMY     TOTAL KNEE ARTHROPLASTY     right   Current Outpatient Medications on File Prior to Visit  Medication Sig Dispense Refill   albuterol (VENTOLIN HFA) 108 (90 Base) MCG/ACT inhaler Inhale 2 puffs into the lungs every 6 (six) hours as needed for wheezing or shortness of breath.     amLODipine (NORVASC) 5 MG tablet Take 5 mg by mouth daily.     aspirin EC 81 MG tablet Take 81 mg by mouth daily. Swallow whole.     atorvastatin (LIPITOR) 20 MG tablet Take 20 mg by mouth daily.   0   B-D UF III MINI PEN NEEDLES 31G X 5 MM MISC Inject into the skin daily.     Blood Glucose Monitoring Suppl (ONETOUCH VERIO REFLECT) w/Device KIT 2 (two) times daily. as directed     Blood Pressure Monitor DEVI See admin instructions.     calcitRIOL (ROCALTROL) 0.5 MCG capsule Take 1 mcg by mouth daily.     calcium-vitamin D (OSCAL WITH D) 500-200 MG-UNIT per tablet Take 1 tablet by mouth 3 (three) times daily.     clotrimazole-betamethasone (LOTRISONE) cream Apply 1 Application topically 2 (two) times daily as needed (to affected areas- for redness or swelling).     diclofenac Sodium (VOLTAREN) 1 % GEL Apply 2 g topically 4 (four) times daily. Rub into affected area of foot 2  to 4 times daily 100 g 2   EX-LAX 15 MG CHEW Chew 7.5 mg by mouth 2 (two) times a week.     famotidine (PEPCID) 20 MG tablet Take 20 mg by mouth daily as needed for heartburn or indigestion.     ferrous sulfate 325 (65 FE) MG tablet Take 325 mg by mouth every other day.     furosemide (LASIX) 20 MG tablet Take 20 mg by mouth every other day.     glucose blood (ONETOUCH VERIO) test strip See admin instructions.     HYDROcodone-acetaminophen (NORCO/VICODIN) 5-325 MG tablet Take 0.5-1 tablets by mouth 2 (two) times daily as needed for moderate pain.     hydrocortisone (ANUSOL-HC) 2.5 % rectal cream Place 1 Application rectally 2 (two) times daily as needed for anal itching or hemorrhoids.     Insulin NPH, Human,, Isophane, (HUMULIN N KWIKPEN) 100 UNIT/ML Kiwkpen Inject 12 Units into the skin in the morning.     levothyroxine (SYNTHROID) 137 MCG tablet Take 137 mcg by mouth daily before breakfast.     lisinopril (  ZESTRIL) 20 MG tablet Take 20 mg by mouth daily.     mometasone (ELOCON) 0.1 % ointment Apply 1 application  topically daily as needed (for irritation- under skin folds).     NON FORMULARY [facility]  Creams-#11     nystatin ointment (MYCOSTATIN) Apply 1 application topically 2 (two) times daily as needed for itching.  0   OneTouch Delica Lancets 30G MISC See admin instructions.     pantoprazole (PROTONIX) 40 MG tablet Take 40 mg by mouth daily before breakfast.     pregabalin (LYRICA) 100 MG capsule Take 100 mg by mouth 2 (two) times daily.     RESTASIS 0.05 % ophthalmic emulsion Place 1 drop into both eyes 2 (two) times daily.     SYSTANE COMPLETE PF 0.6 % SOLN Place 1 drop into both eyes 2 (two) times daily as needed (for dryness).     TYLENOL 500 MG tablet Take 1,000 mg by mouth See admin instructions. Take 1,000 mg by mouth three times a day- when not taking Norco 5/325     valACYclovir (VALTREX) 1000 MG tablet Take 1,000 mg by mouth every 12 (twelve) hours as needed (as  directed- "for flares of anal sores").     [DISCONTINUED] escitalopram (LEXAPRO) 10 MG tablet Take 10 mg by mouth daily.       No current facility-administered medications on file prior to visit.    Allergies  Allergen Reactions   Actos [Pioglitazone Hydrochloride] Swelling   Amlodipine Besylate Other (See Comments)    Taking in 2024   Cephalexin Other (See Comments)    Doesn't remember reaction   Lorazepam Other (See Comments)    Swelling    Oxycodone     This medication makes her feel sick   Oxycodone-Acetaminophen Nausea And Vomiting   Penicillin G Benzathine Other (See Comments)    Reaction not recalled   Rosiglitazone Other (See Comments)    Reaction not recalled   Tramadol Other (See Comments)    hallucinations   Tramadol Hcl Other (See Comments)    Hallucinations   Social History   Occupational History    Employer: RETIRED  Tobacco Use   Smoking status: Former    Passive exposure: Never   Smokeless tobacco: Never  Vaping Use   Vaping status: Never Used  Substance and Sexual Activity   Alcohol use: No   Drug use: No   Sexual activity: Not on file   Family History  Problem Relation Age of Onset   Pneumonia Mother    Heart attack Father    Breast cancer Neg Hx    Immunization History  Administered Date(s) Administered   DT (Pediatric) 05/20/2008   Fluad Quad(high Dose 65+) 10/24/2015, 12/31/2016   Influenza Split 11/19/2008, 12/01/2009   Influenza,inj,Quad PF,6+ Mos 10/08/2014   Influenza,inj,quad, With Preservative 10/04/2016   Influenza,trivalent, recombinat, inj, PF 11/19/2008, 12/01/2009   Influenza-Unspecified 09/28/2010, 11/01/2011, 10/16/2012, 10/17/2013, 10/01/2014, 10/24/2015, 12/31/2016   PPD Test 04/20/2017, 05/04/2017   Pneumococcal Conjugate-13 02/15/2014   Pneumococcal-Unspecified 10/05/2003, 01/04/2005, 01/04/2006   Td 05/20/2008   Td (Adult), 2 Lf Tetanus Toxid, Preservative Free 05/20/2008   Tdap 10/07/2009   Zoster  Recombinant(Shingrix) 01/12/2017   Zoster, Live 09/26/2009, 10/07/2012, 04/16/2016     Review of Systems: Negative except as noted in the HPI.   Objective: There were no vitals filed for this visit.  [name] is a pleasant 83 y.o. female in NAD. AAO X 3.  Diabetic foot exam was performed with the  following findings:   Vascular Examination: Capillary refill time immediate b/l. Vascular status intact b/l with palpable pedal pulses. Pedal hair absent b/l. No pain with calf compression b/l. Skin temperature gradient WNL b/l. No cyanosis or clubbing b/l. No ischemia or gangrene noted b/l. Trace edema noted BLE.  Neurological Examination: Protective sensation diminished with 10g monofilament b/l. Vibratory sensation decreased b/l.  Dermatological Examination: Pedal skin with normal turgor, texture and tone b/l.  No open wounds. No interdigital macerations.   Toenails 2-5 b/l thick, discolored, elongated with subungual debris and pain on dorsal palpation.   Anonychia noted bilateral great toes. Nailbed(s) epithelialized.   Musculoskeletal Examination: Muscle strength 5/5 to all lower extremity muscle groups bilaterally. HAV with bunion deformity noted b/l LE. Pes planus deformity noted bilateral LE.  Radiographs: None     Lab Results  Component Value Date   HGBA1C 6.6 (H) 04/07/2017   ADA Risk Categorization: Low Risk :  Patient has all of the following: Intact protective sensation No prior foot ulcer  No severe deformity Pedal pulses present  Assessment: 1. Pain due to onychomycosis of toenails of both feet   2. Hallux valgus, acquired, unspecified laterality   3. Pes planus of both feet   4. Diabetic peripheral neuropathy associated with type 2 diabetes mellitus (HCC)   5. Encounter for diabetic foot exam (HCC)     Plan: -Diabetic foot examination performed today. -Continue diabetic foot care principles: inspect feet daily, monitor glucose as recommended by PCP  and/or Endocrinologist, and follow prescribed diet per PCP, Endocrinologist and/or dietician. -Patient to continue soft, supportive shoe gear daily. -Mycotic toenails 2-5 bilaterally were debrided in length and girth with sterile nail nippers and dremel without iatrogenic bleeding. -Patient/POA to call should there be question/concern in the interim. Return in about 3 months (around 06/30/2023).  Freddie Breech, DPM      [place] LOCATION: [address], [place] [address]                   Office [phone]   [facility] LOCATION: [address] [place], [place] [address] Office [phone]

## 2023-04-05 ENCOUNTER — Inpatient Hospital Stay (HOSPITAL_BASED_OUTPATIENT_CLINIC_OR_DEPARTMENT_OTHER): Payer: Medicare Other | Admitting: Oncology

## 2023-04-05 ENCOUNTER — Telehealth: Payer: Self-pay | Admitting: Oncology

## 2023-04-05 ENCOUNTER — Ambulatory Visit (HOSPITAL_BASED_OUTPATIENT_CLINIC_OR_DEPARTMENT_OTHER)
Admission: RE | Admit: 2023-04-05 | Discharge: 2023-04-05 | Disposition: A | Discharge status: Home / Self Care | Source: Ambulatory Visit | Attending: Oncology | Admitting: Oncology

## 2023-04-05 ENCOUNTER — Inpatient Hospital Stay: Payer: Medicare Other | Attending: Oncology

## 2023-04-05 VITALS — BP 128/51 | HR 73 | Temp 98.2°F | Resp 18 | Ht 64.0 in | Wt 226.9 lb

## 2023-04-05 DIAGNOSIS — D649 Anemia, unspecified: Secondary | ICD-10-CM

## 2023-04-05 DIAGNOSIS — Z96643 Presence of artificial hip joint, bilateral: Secondary | ICD-10-CM | POA: Diagnosis not present

## 2023-04-05 DIAGNOSIS — M47812 Spondylosis without myelopathy or radiculopathy, cervical region: Secondary | ICD-10-CM | POA: Diagnosis not present

## 2023-04-05 DIAGNOSIS — D72829 Elevated white blood cell count, unspecified: Secondary | ICD-10-CM

## 2023-04-05 LAB — CBC WITH DIFFERENTIAL (CANCER CENTER ONLY)
Abs Immature Granulocytes: 0.03 10*3/uL (ref 0.00–0.07)
Basophils Absolute: 0.1 10*3/uL (ref 0.0–0.1)
Basophils Relative: 0 %
Eosinophils Absolute: 0.1 10*3/uL (ref 0.0–0.5)
Eosinophils Relative: 1 %
HCT: 31.6 % — ABNORMAL LOW (ref 36.0–46.0)
Hemoglobin: 10.1 g/dL — ABNORMAL LOW (ref 12.0–15.0)
Immature Granulocytes: 0 %
Lymphocytes Relative: 17 %
Lymphs Abs: 2.2 10*3/uL (ref 0.7–4.0)
MCH: 29 pg (ref 26.0–34.0)
MCHC: 32 g/dL (ref 30.0–36.0)
MCV: 90.8 fL (ref 80.0–100.0)
Monocytes Absolute: 0.7 10*3/uL (ref 0.1–1.0)
Monocytes Relative: 6 %
Neutro Abs: 9.7 10*3/uL — ABNORMAL HIGH (ref 1.7–7.7)
Neutrophils Relative %: 76 %
Platelet Count: 338 10*3/uL (ref 150–400)
RBC: 3.48 MIL/uL — ABNORMAL LOW (ref 3.87–5.11)
RDW: 13.6 % (ref 11.5–15.5)
WBC Count: 12.8 10*3/uL — ABNORMAL HIGH (ref 4.0–10.5)
nRBC: 0 % (ref 0.0–0.2)

## 2023-04-05 NOTE — Telephone Encounter (Signed)
 Contacted pt to schedule an appt per 04/05/23. Unable to reach via phone, voicemail was full.

## 2023-04-05 NOTE — Progress Notes (Signed)
  [facility] OFFICE PROGRESS NOTE   Diagnosis: Anemia, leukocytosis  INTERVAL HISTORY:   [name] returns for a scheduled visit.  She reports acute onset neck pain when walking out of her kitchen approximately 1 month ago.  She was evaluated [facility] and prescribed 5 days of prednisone.  The neck was x-rayed, but she has not received a report.  She has persistent pain at the right neck.  She has difficulty extending the neck.  Good appetite.  No fever.  No other complaint.  Objective:  Vital signs in last 24 hours:  Blood pressure (!) 128/51, pulse 73, temperature 98.2 F (36.8 C), temperature source Temporal, resp. rate 18, height 5\' 4"  (1.626 m), weight 226 lb 14.4 oz (102.9 kg), SpO2 100%.    HEENT: Neck without mass, no tenderness at the cervical spine or neck.  The area of discomfort is over the right trapezius. Lymphatics: No cervical, supraclavicular, axillary, or inguinal nodes Resp: Lungs clear bilaterally Cardio: Regular rate and rhythm GI: No hepatosplenomegaly Vascular: Trace lower leg edema bilaterally Neuro: The motor exam appears intact in the arms bilaterally    Lab Results:  Lab Results  Component Value Date   WBC 12.8 (H) 04/05/2023   HGB 10.1 (L) 04/05/2023   HCT 31.6 (L) 04/05/2023   MCV 90.8 04/05/2023   PLT 338 04/05/2023   NEUTROABS 9.7 (H) 04/05/2023    CMP  Lab Results  Component Value Date   NA 137 11/02/2022   K 3.7 11/02/2022   CL 101 11/02/2022   CO2 27 11/02/2022   GLUCOSE 156 (H) 11/02/2022   BUN 26 (H) 11/02/2022   CREATININE 1.38 (H) 11/02/2022   CALCIUM 9.9 11/02/2022   PROT 6.9 11/02/2022   ALBUMIN 4.4 11/02/2022   AST 16 11/02/2022   ALT 12 11/02/2022   ALKPHOS 50 11/02/2022   BILITOT 0.8 11/02/2022   GFRNONAA 38 (L) 11/02/2022   GFRAA 42 (L) 04/08/2017    Medications: I have reviewed the patient's current medications.   Assessment/Plan: Normocytic anemia and leukocytosis 11/16/2021 ferritin  63; erythropoietin 10.4; B12 391; myeloma panel with 0.3 g/dL M spike with IFE showing IgA monoclonal protein with lambda light chain specificity; Kappa free light chains 47.8, lambda free light chains 58.5, normal ratio Stable serum M spike 11/02/2022 Hypertension Diabetes Chronic kidney disease History of thyroid cancer Neck pain 04/05/2023     Disposition: [name] has chronic mild anemia.  She has chronic mild neutrophilia.  She also has a serum monoclonal IgA lambda protein.  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  The chronic mild anemia may be related to chronic kidney disease.  I have a low clinical suspicion for multiple myeloma. However, she has new onset neck pain.  We will refer her for a cervical spine series and metastatic bone survey to look for evidence of a compression fracture and lytic bone lesions.  She will return for an office and lab visit in 3 months.  We will contact her with results of the x-rays.  I recommended she follow-up with Dr. Docia Chuck for evaluation of the neck pain.  Thornton Papas, MD  04/05/2023  11:06 AM

## 2023-04-06 ENCOUNTER — Telehealth: Payer: Self-pay | Admitting: *Deleted

## 2023-04-06 NOTE — Telephone Encounter (Signed)
-----   Message from Thornton Papas sent at 04/05/2023  4:17 PM EDT ----- Please call patient, the x-ray showed no lytic lesions to suggest multiple myeloma.  There are degenerative changes in the neck-progressive from 2022.  She should follow-up with her primary provider for evaluation of neck pain

## 2023-04-06 NOTE — Telephone Encounter (Signed)
 Notified Ms. Jacquez that the x-ray showed no lytic lesions to suggest multiple myeloma.  There are degenerative changes in the neck-progressive from 2022.  She should follow-up with her primary provider for evaluation of neck pain. Copy of report sent to PCP.

## 2023-04-06 NOTE — Telephone Encounter (Signed)
 Mrs. Lathon requested nurse call her daughter, Earlie Lou and explain the bone survey results, as she does not understand what nurse told her yesterday.  Attempted to call daughter and left VM that nurse will try to call her tomorrow.

## 2023-04-07 NOTE — Telephone Encounter (Signed)
 Spoke with daughter regarding bone survey results re: cervical spine degenerative changes and that results sent to PCP. F/U with PCP as to management. May need referral to ortho.

## 2023-04-08 NOTE — Telephone Encounter (Signed)
 Patient has been scheduled. Aware of appt date and time.

## 2023-04-11 DIAGNOSIS — M810 Age-related osteoporosis without current pathological fracture: Secondary | ICD-10-CM | POA: Diagnosis not present

## 2023-04-11 DIAGNOSIS — N1832 Chronic kidney disease, stage 3b: Secondary | ICD-10-CM | POA: Diagnosis not present

## 2023-04-11 DIAGNOSIS — I10 Essential (primary) hypertension: Secondary | ICD-10-CM | POA: Diagnosis not present

## 2023-04-11 DIAGNOSIS — E890 Postprocedural hypothyroidism: Secondary | ICD-10-CM | POA: Diagnosis not present

## 2023-04-11 DIAGNOSIS — E113293 Type 2 diabetes mellitus with mild nonproliferative diabetic retinopathy without macular edema, bilateral: Secondary | ICD-10-CM | POA: Diagnosis not present

## 2023-04-11 DIAGNOSIS — I69328 Other speech and language deficits following cerebral infarction: Secondary | ICD-10-CM | POA: Diagnosis not present

## 2023-04-11 DIAGNOSIS — D649 Anemia, unspecified: Secondary | ICD-10-CM | POA: Diagnosis not present

## 2023-04-11 DIAGNOSIS — E7800 Pure hypercholesterolemia, unspecified: Secondary | ICD-10-CM | POA: Diagnosis not present

## 2023-04-11 DIAGNOSIS — Z79899 Other long term (current) drug therapy: Secondary | ICD-10-CM | POA: Diagnosis not present

## 2023-04-11 DIAGNOSIS — Z0001 Encounter for general adult medical examination with abnormal findings: Secondary | ICD-10-CM | POA: Diagnosis not present

## 2023-04-11 DIAGNOSIS — M48061 Spinal stenosis, lumbar region without neurogenic claudication: Secondary | ICD-10-CM | POA: Diagnosis not present

## 2023-04-11 DIAGNOSIS — E209 Hypoparathyroidism, unspecified: Secondary | ICD-10-CM | POA: Diagnosis not present

## 2023-04-20 DIAGNOSIS — M542 Cervicalgia: Secondary | ICD-10-CM | POA: Diagnosis not present

## 2023-04-22 DIAGNOSIS — M542 Cervicalgia: Secondary | ICD-10-CM | POA: Diagnosis not present

## 2023-04-27 ENCOUNTER — Emergency Department (HOSPITAL_COMMUNITY)
Admission: EM | Admit: 2023-04-27 | Discharge: 2023-04-28 | Disposition: A | Discharge status: Home / Self Care | Attending: Emergency Medicine | Admitting: Emergency Medicine

## 2023-04-27 ENCOUNTER — Encounter (HOSPITAL_COMMUNITY): Payer: Self-pay

## 2023-04-27 ENCOUNTER — Emergency Department (HOSPITAL_COMMUNITY)

## 2023-04-27 ENCOUNTER — Other Ambulatory Visit: Payer: Self-pay

## 2023-04-27 DIAGNOSIS — Y92512 Supermarket, store or market as the place of occurrence of the external cause: Secondary | ICD-10-CM | POA: Diagnosis not present

## 2023-04-27 DIAGNOSIS — Z7982 Long term (current) use of aspirin: Secondary | ICD-10-CM | POA: Diagnosis not present

## 2023-04-27 DIAGNOSIS — S199XXA Unspecified injury of neck, initial encounter: Secondary | ICD-10-CM | POA: Diagnosis not present

## 2023-04-27 DIAGNOSIS — M47812 Spondylosis without myelopathy or radiculopathy, cervical region: Secondary | ICD-10-CM | POA: Diagnosis not present

## 2023-04-27 DIAGNOSIS — E119 Type 2 diabetes mellitus without complications: Secondary | ICD-10-CM | POA: Insufficient documentation

## 2023-04-27 DIAGNOSIS — S299XXA Unspecified injury of thorax, initial encounter: Secondary | ICD-10-CM | POA: Diagnosis not present

## 2023-04-27 DIAGNOSIS — S0990XA Unspecified injury of head, initial encounter: Secondary | ICD-10-CM | POA: Diagnosis not present

## 2023-04-27 DIAGNOSIS — Z79899 Other long term (current) drug therapy: Secondary | ICD-10-CM | POA: Diagnosis not present

## 2023-04-27 DIAGNOSIS — W19XXXA Unspecified fall, initial encounter: Secondary | ICD-10-CM | POA: Diagnosis not present

## 2023-04-27 DIAGNOSIS — S3993XA Unspecified injury of pelvis, initial encounter: Secondary | ICD-10-CM | POA: Diagnosis not present

## 2023-04-27 DIAGNOSIS — M542 Cervicalgia: Secondary | ICD-10-CM | POA: Insufficient documentation

## 2023-04-27 DIAGNOSIS — Z794 Long term (current) use of insulin: Secondary | ICD-10-CM | POA: Diagnosis not present

## 2023-04-27 DIAGNOSIS — Z043 Encounter for examination and observation following other accident: Secondary | ICD-10-CM | POA: Diagnosis not present

## 2023-04-27 DIAGNOSIS — M25552 Pain in left hip: Secondary | ICD-10-CM | POA: Diagnosis not present

## 2023-04-27 DIAGNOSIS — I129 Hypertensive chronic kidney disease with stage 1 through stage 4 chronic kidney disease, or unspecified chronic kidney disease: Secondary | ICD-10-CM | POA: Insufficient documentation

## 2023-04-27 DIAGNOSIS — N189 Chronic kidney disease, unspecified: Secondary | ICD-10-CM | POA: Insufficient documentation

## 2023-04-27 DIAGNOSIS — I6523 Occlusion and stenosis of bilateral carotid arteries: Secondary | ICD-10-CM | POA: Diagnosis not present

## 2023-04-27 DIAGNOSIS — W01198A Fall on same level from slipping, tripping and stumbling with subsequent striking against other object, initial encounter: Secondary | ICD-10-CM | POA: Diagnosis not present

## 2023-04-27 DIAGNOSIS — Z981 Arthrodesis status: Secondary | ICD-10-CM | POA: Diagnosis not present

## 2023-04-27 DIAGNOSIS — K439 Ventral hernia without obstruction or gangrene: Secondary | ICD-10-CM | POA: Diagnosis not present

## 2023-04-27 DIAGNOSIS — Z743 Need for continuous supervision: Secondary | ICD-10-CM | POA: Diagnosis not present

## 2023-04-27 DIAGNOSIS — M4802 Spinal stenosis, cervical region: Secondary | ICD-10-CM | POA: Diagnosis not present

## 2023-04-27 DIAGNOSIS — S3991XA Unspecified injury of abdomen, initial encounter: Secondary | ICD-10-CM | POA: Diagnosis not present

## 2023-04-27 DIAGNOSIS — J45909 Unspecified asthma, uncomplicated: Secondary | ICD-10-CM | POA: Diagnosis not present

## 2023-04-27 LAB — CBC WITH DIFFERENTIAL/PLATELET
Abs Immature Granulocytes: 0.05 10*3/uL (ref 0.00–0.07)
Basophils Absolute: 0 10*3/uL (ref 0.0–0.1)
Basophils Relative: 0 %
Eosinophils Absolute: 0.1 10*3/uL (ref 0.0–0.5)
Eosinophils Relative: 1 %
HCT: 33.2 % — ABNORMAL LOW (ref 36.0–46.0)
Hemoglobin: 10.4 g/dL — ABNORMAL LOW (ref 12.0–15.0)
Immature Granulocytes: 0 %
Lymphocytes Relative: 16 %
Lymphs Abs: 1.9 10*3/uL (ref 0.7–4.0)
MCH: 28.8 pg (ref 26.0–34.0)
MCHC: 31.3 g/dL (ref 30.0–36.0)
MCV: 92 fL (ref 80.0–100.0)
Monocytes Absolute: 0.7 10*3/uL (ref 0.1–1.0)
Monocytes Relative: 6 %
Neutro Abs: 9.2 10*3/uL — ABNORMAL HIGH (ref 1.7–7.7)
Neutrophils Relative %: 77 %
Platelets: 330 10*3/uL (ref 150–400)
RBC: 3.61 MIL/uL — ABNORMAL LOW (ref 3.87–5.11)
RDW: 13.2 % (ref 11.5–15.5)
WBC: 11.8 10*3/uL — ABNORMAL HIGH (ref 4.0–10.5)
nRBC: 0 % (ref 0.0–0.2)

## 2023-04-27 LAB — I-STAT CHEM 8, ED
BUN: 23 mg/dL (ref 8–23)
Calcium, Ion: 1.36 mmol/L (ref 1.15–1.40)
Chloride: 105 mmol/L (ref 98–111)
Creatinine, Ser: 1.9 mg/dL — ABNORMAL HIGH (ref 0.44–1.00)
Glucose, Bld: 139 mg/dL — ABNORMAL HIGH (ref 70–99)
HCT: 33 % — ABNORMAL LOW (ref 36.0–46.0)
Hemoglobin: 11.2 g/dL — ABNORMAL LOW (ref 12.0–15.0)
Potassium: 3.5 mmol/L (ref 3.5–5.1)
Sodium: 143 mmol/L (ref 135–145)
TCO2: 26 mmol/L (ref 22–32)

## 2023-04-27 LAB — COMPREHENSIVE METABOLIC PANEL WITH GFR
ALT: 13 U/L (ref 0–44)
AST: 14 U/L — ABNORMAL LOW (ref 15–41)
Albumin: 3.9 g/dL (ref 3.5–5.0)
Alkaline Phosphatase: 47 U/L (ref 38–126)
Anion gap: 8 (ref 5–15)
BUN: 25 mg/dL — ABNORMAL HIGH (ref 8–23)
CO2: 27 mmol/L (ref 22–32)
Calcium: 10.4 mg/dL — ABNORMAL HIGH (ref 8.9–10.3)
Chloride: 105 mmol/L (ref 98–111)
Creatinine, Ser: 1.51 mg/dL — ABNORMAL HIGH (ref 0.44–1.00)
GFR, Estimated: 34 mL/min — ABNORMAL LOW (ref >60)
Glucose, Bld: 148 mg/dL — ABNORMAL HIGH (ref 70–99)
Potassium: 3.4 mmol/L — ABNORMAL LOW (ref 3.5–5.1)
Sodium: 140 mmol/L (ref 135–145)
Total Bilirubin: 0.9 mg/dL (ref 0.0–1.2)
Total Protein: 7 g/dL (ref 6.5–8.1)

## 2023-04-27 MED ORDER — IOHEXOL 300 MG/ML  SOLN
80.0000 mL | Freq: Once | INTRAMUSCULAR | Status: AC | PRN
  Administered 2023-04-27: 80 mL via INTRAVENOUS

## 2023-04-27 MED ORDER — ACETAMINOPHEN 500 MG PO TABS
1000.0000 mg | ORAL_TABLET | Freq: Once | ORAL | Status: AC
  Administered 2023-04-27: 1000 mg via ORAL
  Filled 2023-04-27: qty 2

## 2023-04-27 NOTE — Discharge Instructions (Addendum)
 It was a pleasure caring for you today. Please follow up with your primary care provider. Seek emergency care if experiencing any new or worsening symptoms.

## 2023-04-27 NOTE — ED Provider Notes (Cosign Needed Addendum)
 [place] EMERGENCY DEPARTMENT AT [facility] Provider Note   CSN: [id number] Arrival date & time: 04/27/23  1757     History  Chief Complaint  Patient presents with   [name]    [name] is a 83 y.o. female with PMHx anemia, asthma, DM, GERD, HLD, HTN, OA, CKD who presents to ED after mechanical fall. Patient was in [employer] and states that she loss her balance and fell onto her left side. Patient complaining of left sided pain from her head to her hip. Patient denies LOC, seizures, blood thinners.   Fall       Home Medications Prior to Admission medications   Medication Sig Start Date End Date Taking? Authorizing Provider  albuterol  (VENTOLIN  HFA) 108 (90 Base) MCG/ACT inhaler Inhale 2 puffs into the lungs every 6 (six) hours as needed for wheezing or shortness of breath. Patient not taking: Reported on 04/05/2023 05/28/20   [provider]  amLODipine  (NORVASC ) 5 MG tablet Take 5 mg by mouth daily. 08/07/19   [provider]  aspirin  EC 81 MG tablet Take 81 mg by mouth daily. Swallow whole.    [provider]  atorvastatin  (LIPITOR) 20 MG tablet Take 20 mg by mouth daily.  06/21/17   [provider]  B-D UF III MINI PEN NEEDLES 31G X 5 MM MISC Inject into the skin daily. 08/16/19   [provider]  Blood Glucose Monitoring Suppl (ONETOUCH VERIO REFLECT) w/Device KIT 2 (two) times daily. as directed 03/27/20   [provider]  Blood Pressure Monitor DEVI See admin instructions. 03/27/20   [provider]  calcitRIOL  (ROCALTROL ) 0.5 MCG capsule Take 1 mcg by mouth daily.    [provider]  calcium -vitamin D  (OSCAL WITH D) 500-200 MG-UNIT per tablet Take 1 tablet by mouth 3 (three) times daily.    [provider]  clotrimazole-betamethasone  (LOTRISONE) cream Apply 1 Application topically 2 (two) times daily as needed (to affected areas- for redness or swelling). 01/08/20   [provider]  diclofenac  Sodium (VOLTAREN ) 1 % GEL Apply 2 g topically 4 (four) times daily. Rub into affected area of foot 2 to 4 times daily 11/19/22   Brandt Cake, DPM  EX-LAX 15 MG CHEW Chew 7.5 mg by mouth 2 (two) times a week.    [provider]  famotidine  (PEPCID ) 20 MG tablet Take 20 mg by mouth daily as needed for heartburn or indigestion.    [provider]  ferrous sulfate  325 (65 FE) MG tablet Take 325 mg by mouth every other day.    [provider]  furosemide  (LASIX ) 20 MG tablet Take 20 mg by mouth every other day.    [provider]  glucose blood (ONETOUCH VERIO) test strip See admin instructions. 03/27/20   [provider]  HYDROcodone -acetaminophen  (NORCO/VICODIN) 5-325 MG tablet Take 0.5-1 tablets by mouth 2 (two) times daily as needed for moderate pain. 12/14/18   LampteyDonley Furth, MD  hydrocortisone (ANUSOL-HC) 2.5 % rectal cream Place 1 Application rectally 2 (two) times daily as needed for anal itching or hemorrhoids. 02/12/19   [provider]  Insulin  NPH, Human,, Isophane, (HUMULIN  N KWIKPEN) 100 UNIT/ML Kiwkpen Inject 12 Units into the skin in the morning.    [provider]  levothyroxine  (SYNTHROID ) 137 MCG tablet Take 137 mcg by mouth daily before breakfast.    [provider]  lisinopril  (ZESTRIL ) 20 MG tablet Take 20 mg by mouth  daily. 01/19/21   [provider]  mometasone (ELOCON) 0.1 % ointment Apply 1 application  topically daily as needed (for irritation- under skin folds). 07/30/19   [provider]  NON FORMULARY [facility]  Creams-#11    [provider]  nystatin ointment (MYCOSTATIN) Apply 1 application topically 2 (two) times daily as needed for itching. 03/09/17   [provider]  OneTouch Delica Lancets 30G MISC See admin instructions. 03/27/20   [provider]  pantoprazole (PROTONIX) 40 MG tablet Take 40 mg by mouth daily  before breakfast. 12/19/18   [provider]  pregabalin  (LYRICA ) 100 MG capsule Take 100 mg by mouth 2 (two) times daily. 02/01/20   [provider]  RESTASIS 0.05 % ophthalmic emulsion Place 1 drop into both eyes 2 (two) times daily. 02/01/20   [provider]  SYSTANE COMPLETE PF 0.6 % SOLN Place 1 drop into both eyes 2 (two) times daily as needed (for dryness).    [provider]  TYLENOL  500 MG tablet Take 1,000 mg by mouth See admin instructions. Take 1,000 mg by mouth three times a day- when not taking Norco 5/325    [provider]  valACYclovir (VALTREX) 1000 MG tablet Take 1,000 mg by mouth every 12 (twelve) hours as needed (as directed- "for flares of anal sores"). Patient not taking: Reported on 04/05/2023    [provider]  escitalopram (LEXAPRO) 10 MG tablet Take 10 mg by mouth daily.    06/28/10  [provider]      Allergies    Actos [pioglitazone hydrochloride], Amlodipine  besylate, Cephalexin , Lorazepam, Oxycodone , Oxycodone -acetaminophen , Penicillin g benzathine, Rosiglitazone, Tramadol , and Tramadol  hcl    Review of Systems   Review of Systems  Constitutional:        Fall    Physical Exam Updated Vital Signs BP (!) 148/57   Pulse 60   Temp 98.2 F (36.8 C)   Resp 17   SpO2 98%  Physical Exam Vitals and nursing note reviewed.  Constitutional:      General: She is not in acute distress.    Appearance: She is not ill-appearing or toxic-appearing.  HENT:     Head: Normocephalic and atraumatic.     Mouth/Throat:     Mouth: Mucous membranes are moist.  Eyes:     General: No scleral icterus.       Right eye: No discharge.        Left eye: No discharge.     Conjunctiva/sclera: Conjunctivae normal.  Cardiovascular:     Rate and Rhythm: Normal rate and regular rhythm.     Pulses: Normal pulses.     Heart sounds: Normal heart sounds. No murmur heard. Pulmonary:     Effort: Pulmonary effort is normal. No  respiratory distress.     Breath sounds: Normal breath sounds. No wheezing, rhonchi or rales.  Abdominal:     General: Abdomen is flat. Bowel sounds are normal. There is no distension.     Palpations: Abdomen is soft. There is no mass.     Tenderness: There is abdominal tenderness.     Comments: Left flank is tender to palpation.  Musculoskeletal:     Right lower leg: No edema.     Left lower leg: No edema.     Comments: Left hip tender to palpation.  Skin:    General: Skin is warm and dry.     Findings: No rash.  Neurological:     General: No  focal deficit present.     Mental Status: She is alert and oriented to person, place, and time. Mental status is at baseline.     Comments: GCS 15. Speech is goal oriented. No deficits appreciated to CN III-XII; symmetric eyebrow raise, no facial drooping, tongue midline. Patient has equal grip strength bilaterally with 5/5 strength against resistance in all major muscle groups bilaterally. Sensation to light touch intact. Patient moves extremities without ataxia.   Psychiatric:        Mood and Affect: Mood normal.        Behavior: Behavior normal.    ED Results / Procedures / Treatments   Labs (all labs ordered are listed, but only abnormal results are displayed) Labs Reviewed  COMPREHENSIVE METABOLIC PANEL WITH GFR  CBC WITH DIFFERENTIAL/PLATELET  I-STAT CHEM 8, ED    EKG None  Radiology No results found.  Procedures Procedures    Medications Ordered in ED Medications - No data to display  ED Course/ Medical Decision Making/ A&P Clinical Course as of 04/27/23 2106  Wed Apr 27, 2023  [id number] 83 year old female here for evaluation of injuries after a fall while grocery shopping.  Complaining of pain in her back and hip but also has pain in her head and neck.  Getting labs and imaging.  Disposition per results of testing. [MB]    Clinical Course User Index [MB] Tonya Fredrickson, MD                                 Medical  Decision Making Amount and/or Complexity of Data Reviewed Labs: ordered. Radiology: ordered.  Risk OTC drugs. Prescription drug management.   This patient presents to the ED after a fall, this involves an extensive number of treatment options, and is a complaint that carries with it a high risk of complications and morbidity.  The differential diagnosis includes  intracranial hemorrhage, subdural/epidural hematoma, vertebral fracture, spinal cord injury, muscle strain, skull fracture, fracture.   Co morbidities that complicate the patient evaluation  anemia, asthma, DM, GERD, HLD, HTN, OA, CKD    Additional history obtained:  Dr. Malon Seamen PCP    Problem List / ED Course / Critical interventions / Medication management  Patient presented for mechanical fall. Patient with stable vitals and does not appear to be in distress. Physical exam with tenderness to palpation of left hip and left flank of abdomen. Patient also complaining of neck pain and headache. Rest of physical exam reassuring. Patient afebrile with stable vitals. I Ordered, and personally interpreted labs.  CBC with mild leukocytosis of 11.8.  There is also anemia with hemoglobin of 10.4. patient is chronically anemic. CMP with elevation in BUN/Cr near patient's baseline at 25/1.51. I ordered imaging studies including hip xray/CT head/cervical spine/chest/abd/pelv . Hip xray without acute process. CT imaging without acute process. I agree with radiologist interpretation. Patient provided with Tylenol . Patient ambulatory to the bathroom. Shared all results. Answered all questions. Recommended following up with PCP appointment tomorrow. Patient verbalized understanding of plan. Staffed with Dr. Randal Bury. I have reviewed the patients home medicines and have made adjustments as needed   Social Determinants of Health:  geriatric          Final Clinical Impression(s) / ED Diagnoses Final diagnoses:  None    Rx / DC  Orders ED Discharge Orders     None         [place],  PA-C 04/27/23 2253    Tonya Fredrickson, MD 04/28/23 [phone]

## 2023-04-27 NOTE — ED Triage Notes (Signed)
 Patient presents via EMS due to a fall at [employer]. While reaching for a cart, the patient lost her balance and fell on her left side. She hit the left side of her head. There was no loss of consciousness, and the patient is not on thinners. She also, post fall, complains of left hip pain and left flank pain. EMS reports no hip deformities and no abrasions on the left flank.      EMS vitals: 170/68 BP 78 P 96% SPO2 on room air 166 CBG

## 2023-04-28 DIAGNOSIS — R0902 Hypoxemia: Secondary | ICD-10-CM | POA: Diagnosis not present

## 2023-04-28 DIAGNOSIS — Z7401 Bed confinement status: Secondary | ICD-10-CM | POA: Diagnosis not present

## 2023-04-28 DIAGNOSIS — R531 Weakness: Secondary | ICD-10-CM | POA: Diagnosis not present

## 2023-04-28 DIAGNOSIS — Z743 Need for continuous supervision: Secondary | ICD-10-CM | POA: Diagnosis not present

## 2023-04-28 NOTE — ED Notes (Addendum)
 Called pt's daughter Imagene Boss to see if she can assist with picking pt up from ED. Pt daughter states she just got back from being out of town and is unable to do so tonight. Will call [facility]. Was informed by previous RN that the other two daughters listed on contacts are also unable to come and pick pt up tonight.

## 2023-04-29 DIAGNOSIS — M542 Cervicalgia: Secondary | ICD-10-CM | POA: Diagnosis not present

## 2023-04-29 DIAGNOSIS — M47892 Other spondylosis, cervical region: Secondary | ICD-10-CM | POA: Diagnosis not present

## 2023-05-02 DIAGNOSIS — M542 Cervicalgia: Secondary | ICD-10-CM | POA: Diagnosis not present

## 2023-05-02 DIAGNOSIS — M533 Sacrococcygeal disorders, not elsewhere classified: Secondary | ICD-10-CM | POA: Diagnosis not present

## 2023-05-02 DIAGNOSIS — R634 Abnormal weight loss: Secondary | ICD-10-CM | POA: Diagnosis not present

## 2023-05-05 DIAGNOSIS — N189 Chronic kidney disease, unspecified: Secondary | ICD-10-CM | POA: Diagnosis not present

## 2023-05-05 DIAGNOSIS — E1165 Type 2 diabetes mellitus with hyperglycemia: Secondary | ICD-10-CM | POA: Diagnosis not present

## 2023-05-05 DIAGNOSIS — C73 Malignant neoplasm of thyroid gland: Secondary | ICD-10-CM | POA: Diagnosis not present

## 2023-05-05 DIAGNOSIS — E890 Postprocedural hypothyroidism: Secondary | ICD-10-CM | POA: Diagnosis not present

## 2023-05-05 DIAGNOSIS — I10 Essential (primary) hypertension: Secondary | ICD-10-CM | POA: Diagnosis not present

## 2023-05-05 DIAGNOSIS — M533 Sacrococcygeal disorders, not elsewhere classified: Secondary | ICD-10-CM | POA: Diagnosis not present

## 2023-05-05 DIAGNOSIS — E892 Postprocedural hypoparathyroidism: Secondary | ICD-10-CM | POA: Diagnosis not present

## 2023-05-05 DIAGNOSIS — M542 Cervicalgia: Secondary | ICD-10-CM | POA: Diagnosis not present

## 2023-05-26 DIAGNOSIS — M47812 Spondylosis without myelopathy or radiculopathy, cervical region: Secondary | ICD-10-CM | POA: Diagnosis not present

## 2023-05-26 DIAGNOSIS — M542 Cervicalgia: Secondary | ICD-10-CM | POA: Diagnosis not present

## 2023-05-31 DIAGNOSIS — I872 Venous insufficiency (chronic) (peripheral): Secondary | ICD-10-CM | POA: Diagnosis not present

## 2023-05-31 DIAGNOSIS — L0390 Cellulitis, unspecified: Secondary | ICD-10-CM | POA: Diagnosis not present

## 2023-06-01 DIAGNOSIS — N189 Chronic kidney disease, unspecified: Secondary | ICD-10-CM | POA: Diagnosis not present

## 2023-06-01 DIAGNOSIS — D631 Anemia in chronic kidney disease: Secondary | ICD-10-CM | POA: Diagnosis not present

## 2023-06-01 DIAGNOSIS — N2581 Secondary hyperparathyroidism of renal origin: Secondary | ICD-10-CM | POA: Diagnosis not present

## 2023-06-01 DIAGNOSIS — N1832 Chronic kidney disease, stage 3b: Secondary | ICD-10-CM | POA: Diagnosis not present

## 2023-06-01 DIAGNOSIS — I129 Hypertensive chronic kidney disease with stage 1 through stage 4 chronic kidney disease, or unspecified chronic kidney disease: Secondary | ICD-10-CM | POA: Diagnosis not present

## 2023-06-03 DIAGNOSIS — R600 Localized edema: Secondary | ICD-10-CM | POA: Diagnosis not present

## 2023-06-03 DIAGNOSIS — I872 Venous insufficiency (chronic) (peripheral): Secondary | ICD-10-CM | POA: Diagnosis not present

## 2023-06-04 DIAGNOSIS — I10 Essential (primary) hypertension: Secondary | ICD-10-CM | POA: Diagnosis not present

## 2023-06-04 DIAGNOSIS — I129 Hypertensive chronic kidney disease with stage 1 through stage 4 chronic kidney disease, or unspecified chronic kidney disease: Secondary | ICD-10-CM | POA: Diagnosis not present

## 2023-06-04 DIAGNOSIS — E113293 Type 2 diabetes mellitus with mild nonproliferative diabetic retinopathy without macular edema, bilateral: Secondary | ICD-10-CM | POA: Diagnosis not present

## 2023-06-04 DIAGNOSIS — H35033 Hypertensive retinopathy, bilateral: Secondary | ICD-10-CM | POA: Diagnosis not present

## 2023-06-17 ENCOUNTER — Other Ambulatory Visit (HOSPITAL_COMMUNITY): Payer: Self-pay | Admitting: *Deleted

## 2023-06-20 ENCOUNTER — Encounter (HOSPITAL_COMMUNITY)
Admission: RE | Admit: 2023-06-20 | Discharge: 2023-06-20 | Disposition: A | Discharge status: Home / Self Care | Source: Ambulatory Visit | Attending: Nephrology | Admitting: Nephrology

## 2023-06-20 VITALS — BP 134/55 | HR 63 | Temp 97.4°F | Resp 18 | Wt 205.0 lb

## 2023-06-20 DIAGNOSIS — D649 Anemia, unspecified: Secondary | ICD-10-CM | POA: Insufficient documentation

## 2023-06-20 MED ORDER — SODIUM CHLORIDE 0.9 % IV SOLN
510.0000 mg | Freq: Once | INTRAVENOUS | Status: AC
  Filled 2023-06-20: qty 510

## 2023-06-27 ENCOUNTER — Encounter (HOSPITAL_COMMUNITY)
Admission: RE | Admit: 2023-06-27 | Discharge: 2023-06-27 | Disposition: A | Discharge status: Home / Self Care | Source: Ambulatory Visit | Attending: Nephrology

## 2023-06-27 VITALS — BP 173/52 | HR 84 | Temp 97.7°F | Resp 16

## 2023-06-27 DIAGNOSIS — D649 Anemia, unspecified: Secondary | ICD-10-CM

## 2023-06-27 MED ORDER — SODIUM CHLORIDE 0.9 % IV SOLN
510.0000 mg | Freq: Once | INTRAVENOUS | Status: AC
  Administered 2023-06-27: 510 mg via INTRAVENOUS
  Filled 2023-06-27: qty 510

## 2023-07-04 DIAGNOSIS — H35033 Hypertensive retinopathy, bilateral: Secondary | ICD-10-CM | POA: Diagnosis not present

## 2023-07-04 DIAGNOSIS — I129 Hypertensive chronic kidney disease with stage 1 through stage 4 chronic kidney disease, or unspecified chronic kidney disease: Secondary | ICD-10-CM | POA: Diagnosis not present

## 2023-07-04 DIAGNOSIS — I10 Essential (primary) hypertension: Secondary | ICD-10-CM | POA: Diagnosis not present

## 2023-07-04 DIAGNOSIS — E113293 Type 2 diabetes mellitus with mild nonproliferative diabetic retinopathy without macular edema, bilateral: Secondary | ICD-10-CM | POA: Diagnosis not present

## 2023-07-05 ENCOUNTER — Inpatient Hospital Stay: Attending: Oncology | Admitting: Oncology

## 2023-07-05 ENCOUNTER — Inpatient Hospital Stay

## 2023-07-05 ENCOUNTER — Encounter: Payer: Self-pay | Admitting: *Deleted

## 2023-07-05 NOTE — Progress Notes (Signed)
"  No show" for lab/OV today. Scheduling message sent to reschedule for ~ 1 month. 

## 2023-07-12 DIAGNOSIS — H6123 Impacted cerumen, bilateral: Secondary | ICD-10-CM | POA: Diagnosis not present

## 2023-07-13 ENCOUNTER — Other Ambulatory Visit

## 2023-07-13 ENCOUNTER — Ambulatory Visit (INDEPENDENT_AMBULATORY_CARE_PROVIDER_SITE_OTHER): Admitting: Podiatry

## 2023-07-13 DIAGNOSIS — I10 Essential (primary) hypertension: Secondary | ICD-10-CM | POA: Diagnosis not present

## 2023-07-13 DIAGNOSIS — E892 Postprocedural hypoparathyroidism: Secondary | ICD-10-CM | POA: Diagnosis not present

## 2023-07-13 DIAGNOSIS — E890 Postprocedural hypothyroidism: Secondary | ICD-10-CM | POA: Diagnosis not present

## 2023-07-13 DIAGNOSIS — C73 Malignant neoplasm of thyroid gland: Secondary | ICD-10-CM | POA: Diagnosis not present

## 2023-07-13 DIAGNOSIS — E1165 Type 2 diabetes mellitus with hyperglycemia: Secondary | ICD-10-CM | POA: Diagnosis not present

## 2023-07-13 DIAGNOSIS — Z91198 Patient's noncompliance with other medical treatment and regimen for other reason: Secondary | ICD-10-CM

## 2023-07-13 DIAGNOSIS — N189 Chronic kidney disease, unspecified: Secondary | ICD-10-CM | POA: Diagnosis not present

## 2023-07-13 NOTE — Progress Notes (Signed)
 1. Failure to attend appointment with reason given    Appointment canceled and rescheduled by patient.

## 2023-07-19 DIAGNOSIS — M47812 Spondylosis without myelopathy or radiculopathy, cervical region: Secondary | ICD-10-CM | POA: Diagnosis not present

## 2023-07-19 DIAGNOSIS — M7918 Myalgia, other site: Secondary | ICD-10-CM | POA: Diagnosis not present

## 2023-07-20 ENCOUNTER — Telehealth: Payer: Self-pay | Admitting: Oncology

## 2023-07-20 ENCOUNTER — Ambulatory Visit: Admitting: Podiatry

## 2023-07-20 NOTE — Telephone Encounter (Signed)
 Pt called to confirm appt for 08/05/23

## 2023-07-27 DIAGNOSIS — H6123 Impacted cerumen, bilateral: Secondary | ICD-10-CM | POA: Diagnosis not present

## 2023-08-03 DIAGNOSIS — G8929 Other chronic pain: Secondary | ICD-10-CM | POA: Diagnosis not present

## 2023-08-03 DIAGNOSIS — H6122 Impacted cerumen, left ear: Secondary | ICD-10-CM | POA: Diagnosis not present

## 2023-08-03 DIAGNOSIS — M5030 Other cervical disc degeneration, unspecified cervical region: Secondary | ICD-10-CM | POA: Diagnosis not present

## 2023-08-05 ENCOUNTER — Ambulatory Visit: Payer: Self-pay | Admitting: Oncology

## 2023-08-05 ENCOUNTER — Inpatient Hospital Stay: Admitting: Oncology

## 2023-08-05 ENCOUNTER — Inpatient Hospital Stay: Attending: Oncology

## 2023-08-05 ENCOUNTER — Ambulatory Visit: Admitting: Oncology

## 2023-08-05 ENCOUNTER — Other Ambulatory Visit

## 2023-08-05 VITALS — BP 121/58 | HR 70 | Temp 97.8°F | Resp 18 | Ht 64.0 in | Wt 223.4 lb

## 2023-08-05 DIAGNOSIS — D72829 Elevated white blood cell count, unspecified: Secondary | ICD-10-CM | POA: Diagnosis not present

## 2023-08-05 DIAGNOSIS — E1122 Type 2 diabetes mellitus with diabetic chronic kidney disease: Secondary | ICD-10-CM | POA: Diagnosis not present

## 2023-08-05 DIAGNOSIS — N189 Chronic kidney disease, unspecified: Secondary | ICD-10-CM | POA: Insufficient documentation

## 2023-08-05 DIAGNOSIS — R894 Abnormal immunological findings in specimens from other organs, systems and tissues: Secondary | ICD-10-CM | POA: Insufficient documentation

## 2023-08-05 DIAGNOSIS — Z85850 Personal history of malignant neoplasm of thyroid: Secondary | ICD-10-CM | POA: Diagnosis not present

## 2023-08-05 DIAGNOSIS — D649 Anemia, unspecified: Secondary | ICD-10-CM | POA: Diagnosis not present

## 2023-08-05 DIAGNOSIS — I129 Hypertensive chronic kidney disease with stage 1 through stage 4 chronic kidney disease, or unspecified chronic kidney disease: Secondary | ICD-10-CM | POA: Diagnosis not present

## 2023-08-05 LAB — BASIC METABOLIC PANEL - CANCER CENTER ONLY
Anion gap: 13 (ref 5–15)
BUN: 31 mg/dL — ABNORMAL HIGH (ref 8–23)
CO2: 23 mmol/L (ref 22–32)
Calcium: 9.9 mg/dL (ref 8.9–10.3)
Chloride: 103 mmol/L (ref 98–111)
Creatinine: 1.52 mg/dL — ABNORMAL HIGH (ref 0.44–1.00)
GFR, Estimated: 33 mL/min — ABNORMAL LOW (ref >60)
Glucose, Bld: 141 mg/dL — ABNORMAL HIGH (ref 70–99)
Potassium: 4.2 mmol/L (ref 3.5–5.1)
Sodium: 139 mmol/L (ref 135–145)

## 2023-08-05 LAB — CBC WITH DIFFERENTIAL (CANCER CENTER ONLY)
Abs Immature Granulocytes: 0.05 K/uL (ref 0.00–0.07)
Basophils Absolute: 0 K/uL (ref 0.0–0.1)
Basophils Relative: 0 %
Eosinophils Absolute: 0.1 K/uL (ref 0.0–0.5)
Eosinophils Relative: 1 %
HCT: 29.5 % — ABNORMAL LOW (ref 36.0–46.0)
Hemoglobin: 9.7 g/dL — ABNORMAL LOW (ref 12.0–15.0)
Immature Granulocytes: 0 %
Lymphocytes Relative: 11 %
Lymphs Abs: 1.6 K/uL (ref 0.7–4.0)
MCH: 29.7 pg (ref 26.0–34.0)
MCHC: 32.9 g/dL (ref 30.0–36.0)
MCV: 90.2 fL (ref 80.0–100.0)
Monocytes Absolute: 1 K/uL (ref 0.1–1.0)
Monocytes Relative: 7 %
Neutro Abs: 11.6 K/uL — ABNORMAL HIGH (ref 1.7–7.7)
Neutrophils Relative %: 81 %
Platelet Count: 298 K/uL (ref 150–400)
RBC: 3.27 MIL/uL — ABNORMAL LOW (ref 3.87–5.11)
RDW: 13.4 % (ref 11.5–15.5)
WBC Count: 14.4 K/uL — ABNORMAL HIGH (ref 4.0–10.5)
nRBC: 0 % (ref 0.0–0.2)

## 2023-08-05 NOTE — Progress Notes (Signed)
  [facility] OFFICE PROGRESS NOTE   Diagnosis: Anemia, leukocytosis, serum M spike  INTERVAL HISTORY:   [name] returns as scheduled.  She complains of persistent neck and low back pain.  She received trigger point injection treatment to the neck muscles on 07/19/2023.  She reports this did not help the pain.  A CT of the cervical spine revealed degenerative changes. She has a good appetite.  No dyspnea.  No fever or night sweats. She received IV iron last month.  Objective:  Vital signs in last 24 hours:  Blood pressure (!) 121/58, pulse 70, temperature 97.8 F (36.6 C), temperature source Temporal, resp. rate 18, height 5' 4 (1.626 m), weight 223 lb 6.4 oz (101.3 kg), SpO2 100%.    HEENT: Neck without mass Lymphatics: No cervical, supraclavicular, axillary, or inguinal nodes Resp: Clear bilaterally Cardio: Regular rate and rhythm GI: No hepatosplenomegaly Vascular: No leg edema   Lab Results:  Lab Results  Component Value Date   WBC 14.4 (H) 08/05/2023   HGB 9.7 (L) 08/05/2023   HCT 29.5 (L) 08/05/2023   MCV 90.2 08/05/2023   PLT 298 08/05/2023   NEUTROABS 11.6 (H) 08/05/2023    CMP  Lab Results  Component Value Date   NA 139 08/05/2023   K 4.2 08/05/2023   CL 103 08/05/2023   CO2 23 08/05/2023   GLUCOSE 141 (H) 08/05/2023   BUN 31 (H) 08/05/2023   CREATININE 1.52 (H) 08/05/2023   CALCIUM  9.9 08/05/2023   PROT 7.0 04/27/2023   ALBUMIN 3.9 04/27/2023   AST 14 (L) 04/27/2023   ALT 13 04/27/2023   ALKPHOS 47 04/27/2023   BILITOT 0.9 04/27/2023   GFRNONAA 33 (L) 08/05/2023   GFRAA 42 (L) 04/08/2017    Medications: I have reviewed the patient's current medications.   Assessment/Plan: Normocytic anemia and leukocytosis 11/16/2021 ferritin 63; erythropoietin  10.4; B12 391; myeloma panel with 0.3 g/dL M spike with IFE showing IgA monoclonal protein with lambda light chain specificity; Kappa free light chains 47.8, lambda free light chains  58.5, normal ratio Stable serum M spike 11/02/2022 Hypertension Diabetes Chronic kidney disease History of thyroid  cancer Neck pain 04/05/2023     Disposition: [name] has stable chronic anemia.  The anemia is most likely related to renal insufficiency.  She recently received IV iron prescribed by nephrology.  It is unclear whether she has begun treatment with erythropoietin  therapy.  We will check with the nephrology service.  The etiology of the neck pain is unclear.  I suspect this is related to degenerative changes.  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  We will follow-up on the M spike from today.  The etiology of the mild neutrophilia is unclear.  She reports no recent steroid use.  [name] will return for an office and lab visit in 3-4 months.  Arley Hof, MD  08/05/2023  12:53 PM

## 2023-08-06 LAB — IGA: IgA: 336 mg/dL (ref 64–422)

## 2023-08-08 ENCOUNTER — Ambulatory Visit: Admitting: Podiatry

## 2023-08-08 LAB — PROTEIN ELECTROPHORESIS, SERUM
A/G Ratio: 1.1 (ref 0.7–1.7)
Albumin ELP: 3.3 g/dL (ref 2.9–4.4)
Alpha-1-Globulin: 0.2 g/dL (ref 0.0–0.4)
Alpha-2-Globulin: 0.8 g/dL (ref 0.4–1.0)
Beta Globulin: 0.9 g/dL (ref 0.7–1.3)
Gamma Globulin: 1 g/dL (ref 0.4–1.8)
Globulin, Total: 3 g/dL (ref 2.2–3.9)
M-Spike, %: 0.3 g/dL — ABNORMAL HIGH
Total Protein ELP: 6.3 g/dL (ref 6.0–8.5)

## 2023-08-08 LAB — KAPPA/LAMBDA LIGHT CHAINS
Kappa free light chain: 28.3 mg/L — ABNORMAL HIGH (ref 3.3–19.4)
Kappa, lambda light chain ratio: 0.53 (ref 0.26–1.65)
Lambda free light chains: 53.6 mg/L — ABNORMAL HIGH (ref 5.7–26.3)

## 2023-08-10 ENCOUNTER — Encounter (HOSPITAL_COMMUNITY): Payer: Self-pay | Admitting: Nephrology

## 2023-08-10 NOTE — Telephone Encounter (Signed)
 Notified patient that her monoclonal protein level is stable. F/U as scheduled.

## 2023-08-10 NOTE — Telephone Encounter (Signed)
-----   Message from Arley Hof sent at 08/08/2023  7:05 PM EDT ----- Please call patient, the serum monoclonal protein level is stable, f/u as scheduled  ----- Message ----- From: Rebecka, Lab In [place] Sent: 08/05/2023  11:39 AM EDT To: Arley KATHEE Hof, MD

## 2023-08-17 DIAGNOSIS — M542 Cervicalgia: Secondary | ICD-10-CM | POA: Diagnosis not present

## 2023-08-17 DIAGNOSIS — M533 Sacrococcygeal disorders, not elsewhere classified: Secondary | ICD-10-CM | POA: Diagnosis not present

## 2023-08-24 DIAGNOSIS — E1165 Type 2 diabetes mellitus with hyperglycemia: Secondary | ICD-10-CM | POA: Diagnosis not present

## 2023-08-24 DIAGNOSIS — C73 Malignant neoplasm of thyroid gland: Secondary | ICD-10-CM | POA: Diagnosis not present

## 2023-08-24 DIAGNOSIS — E892 Postprocedural hypoparathyroidism: Secondary | ICD-10-CM | POA: Diagnosis not present

## 2023-08-31 DIAGNOSIS — C73 Malignant neoplasm of thyroid gland: Secondary | ICD-10-CM | POA: Diagnosis not present

## 2023-08-31 DIAGNOSIS — E892 Postprocedural hypoparathyroidism: Secondary | ICD-10-CM | POA: Diagnosis not present

## 2023-08-31 DIAGNOSIS — E890 Postprocedural hypothyroidism: Secondary | ICD-10-CM | POA: Diagnosis not present

## 2023-08-31 DIAGNOSIS — E1165 Type 2 diabetes mellitus with hyperglycemia: Secondary | ICD-10-CM | POA: Diagnosis not present

## 2023-08-31 DIAGNOSIS — I10 Essential (primary) hypertension: Secondary | ICD-10-CM | POA: Diagnosis not present

## 2023-08-31 DIAGNOSIS — N189 Chronic kidney disease, unspecified: Secondary | ICD-10-CM | POA: Diagnosis not present

## 2023-09-14 DIAGNOSIS — M542 Cervicalgia: Secondary | ICD-10-CM | POA: Diagnosis not present

## 2023-09-20 DIAGNOSIS — M542 Cervicalgia: Secondary | ICD-10-CM | POA: Diagnosis not present

## 2023-09-21 ENCOUNTER — Ambulatory Visit: Admitting: Podiatry

## 2023-09-23 DIAGNOSIS — Z79899 Other long term (current) drug therapy: Secondary | ICD-10-CM | POA: Diagnosis not present

## 2023-09-23 DIAGNOSIS — M5090 Cervical disc disorder, unspecified, unspecified cervical region: Secondary | ICD-10-CM | POA: Diagnosis not present

## 2023-09-23 DIAGNOSIS — Z23 Encounter for immunization: Secondary | ICD-10-CM | POA: Diagnosis not present

## 2023-09-23 DIAGNOSIS — I10 Essential (primary) hypertension: Secondary | ICD-10-CM | POA: Diagnosis not present

## 2023-09-23 DIAGNOSIS — N1832 Chronic kidney disease, stage 3b: Secondary | ICD-10-CM | POA: Diagnosis not present

## 2023-09-23 DIAGNOSIS — E113293 Type 2 diabetes mellitus with mild nonproliferative diabetic retinopathy without macular edema, bilateral: Secondary | ICD-10-CM | POA: Diagnosis not present

## 2023-10-05 DIAGNOSIS — M542 Cervicalgia: Secondary | ICD-10-CM | POA: Diagnosis not present

## 2023-10-14 ENCOUNTER — Telehealth: Payer: Self-pay | Admitting: Oncology

## 2023-10-19 DIAGNOSIS — M542 Cervicalgia: Secondary | ICD-10-CM | POA: Diagnosis not present

## 2023-11-01 ENCOUNTER — Other Ambulatory Visit: Payer: Self-pay | Admitting: Family Medicine

## 2023-11-01 DIAGNOSIS — R519 Headache, unspecified: Secondary | ICD-10-CM

## 2023-11-08 ENCOUNTER — Inpatient Hospital Stay: Attending: Oncology

## 2023-11-08 ENCOUNTER — Inpatient Hospital Stay: Admitting: Oncology

## 2023-11-08 VITALS — BP 152/58 | HR 75 | Temp 98.1°F | Resp 18 | Ht 64.0 in | Wt 230.0 lb

## 2023-11-08 DIAGNOSIS — D649 Anemia, unspecified: Secondary | ICD-10-CM

## 2023-11-08 LAB — CBC WITH DIFFERENTIAL (CANCER CENTER ONLY)
Abs Immature Granulocytes: 0.03 K/uL (ref 0.00–0.07)
Basophils Absolute: 0 K/uL (ref 0.0–0.1)
Basophils Relative: 0 %
Eosinophils Absolute: 0.1 K/uL (ref 0.0–0.5)
Eosinophils Relative: 1 %
HCT: 30 % — ABNORMAL LOW (ref 36.0–46.0)
Hemoglobin: 9.7 g/dL — ABNORMAL LOW (ref 12.0–15.0)
Immature Granulocytes: 0 %
Lymphocytes Relative: 11 %
Lymphs Abs: 1.3 K/uL (ref 0.7–4.0)
MCH: 29.9 pg (ref 26.0–34.0)
MCHC: 32.3 g/dL (ref 30.0–36.0)
MCV: 92.6 fL (ref 80.0–100.0)
Monocytes Absolute: 0.6 K/uL (ref 0.1–1.0)
Monocytes Relative: 6 %
Neutro Abs: 9.3 K/uL — ABNORMAL HIGH (ref 1.7–7.7)
Neutrophils Relative %: 82 %
Platelet Count: 284 K/uL (ref 150–400)
RBC: 3.24 MIL/uL — ABNORMAL LOW (ref 3.87–5.11)
RDW: 13.2 % (ref 11.5–15.5)
WBC Count: 11.4 K/uL — ABNORMAL HIGH (ref 4.0–10.5)
nRBC: 0 % (ref 0.0–0.2)

## 2023-11-08 LAB — CMP (CANCER CENTER ONLY)
ALT: 10 U/L (ref 0–44)
AST: 15 U/L (ref 15–41)
Albumin: 4.1 g/dL (ref 3.5–5.0)
Alkaline Phosphatase: 54 U/L (ref 38–126)
Anion gap: 10 (ref 5–15)
BUN: 27 mg/dL — ABNORMAL HIGH (ref 8–23)
CO2: 26 mmol/L (ref 22–32)
Calcium: 9.5 mg/dL (ref 8.9–10.3)
Chloride: 101 mmol/L (ref 98–111)
Creatinine: 1.28 mg/dL — ABNORMAL HIGH (ref 0.44–1.00)
GFR, Estimated: 41 mL/min — ABNORMAL LOW (ref >60)
Glucose, Bld: 132 mg/dL — ABNORMAL HIGH (ref 70–99)
Potassium: 4.4 mmol/L (ref 3.5–5.1)
Sodium: 138 mmol/L (ref 135–145)
Total Bilirubin: 0.5 mg/dL (ref 0.0–1.2)
Total Protein: 6.8 g/dL (ref 6.5–8.1)

## 2023-11-08 LAB — SAMPLE TO BLOOD BANK

## 2023-11-08 NOTE — Progress Notes (Signed)
  [facility] OFFICE PROGRESS NOTE   Diagnosis: Anemia, serum monoclonal protein  INTERVAL HISTORY:   [name] returns as scheduled.  She continues to have pain in the right side of the neck and head.  She is followed by Dr. Regino and has been referred for an MRI.  She reports a good appetite.  No fever, night sweats, or bleeding.  Objective:  Vital signs in last 24 hours:  Blood pressure (!) 152/58, pulse 75, temperature 98.1 F (36.7 C), temperature source Temporal, resp. rate 18, height 5' 4 (1.626 m), weight 230 lb (104.3 kg), SpO2 99%.    HEENT: Neck without mass or tenderness Lymphatics: No cervical, supraclavicular, axillary, or inguinal nodes Resp: Lungs clear bilaterally Cardio: Regular rate and rhythm GI: No hepatosplenomegaly Vascular: 1+ edema at the lower leg bilaterally  Lab Results:  Lab Results  Component Value Date   WBC 11.4 (H) 11/08/2023   HGB 9.7 (L) 11/08/2023   HCT 30.0 (L) 11/08/2023   MCV 92.6 11/08/2023   PLT 284 11/08/2023   NEUTROABS 9.3 (H) 11/08/2023    CMP  Lab Results  Component Value Date   NA 139 08/05/2023   K 4.2 08/05/2023   CL 103 08/05/2023   CO2 23 08/05/2023   GLUCOSE 141 (H) 08/05/2023   BUN 31 (H) 08/05/2023   CREATININE 1.52 (H) 08/05/2023   CALCIUM  9.9 08/05/2023   PROT 7.0 04/27/2023   ALBUMIN 3.9 04/27/2023   AST 14 (L) 04/27/2023   ALT 13 04/27/2023   ALKPHOS 47 04/27/2023   BILITOT 0.9 04/27/2023   GFRNONAA 33 (L) 08/05/2023   GFRAA 42 (L) 04/08/2017    No results found for: CEA1, CEA, CAN199, CA125  Lab Results  Component Value Date   INR 1.02 04/04/2017   LABPROT 13.3 04/04/2017    Imaging:  No results found.  Medications: I have reviewed the patient's current medications.   Assessment/Plan: Normocytic anemia and leukocytosis 11/16/2021 ferritin 63; erythropoietin  10.4; B12 391; myeloma panel with 0.3 g/dL M spike with IFE showing IgA monoclonal protein with lambda  light chain specificity; Kappa free light chains 47.8, lambda free light chains 58.5, normal ratio Stable serum M spike 11/02/2022, IgA lambda Stable serum M spike 08/05/2023 Hypertension Diabetes Chronic kidney disease History of thyroid  cancer Neck pain 04/05/2023      Disposition: [name] has chronic normocytic anemia.  The hemoglobin has not changed significantly over the past 2 years.  She has chronic mild neutrophilia.  The anemia is likely secondary to renal insufficiency, though she could have a hematologic condition such as myelodysplasia.  She has a low level stable serum M spike.  I have a low clinical suspicion for multiple myeloma or another hematopoietic malignancy.  We discussed a bone marrow biopsy.  The plan is to continue observation.  We will refer her for a diagnostic bone marrow biopsy if she develops progressive anemia.  She has chronic mild neutrophilia.  She reports taking prednisone in the past (latest during the summer 2025) for back pain.  She is not taking steroids at present and she does not smoke.  She will continue follow-up with Dr. Regino for the head and neck pain.  She will return for an office and lab visit in 4 months.  Arley Hof, MD  11/08/2023  10:13 AM

## 2023-11-21 ENCOUNTER — Other Ambulatory Visit

## 2023-11-21 ENCOUNTER — Ambulatory Visit: Admitting: Oncology

## 2023-12-27 ENCOUNTER — Ambulatory Visit
Admission: RE | Admit: 2023-12-27 | Discharge: 2023-12-27 | Disposition: A | Discharge status: Home / Self Care | Source: Ambulatory Visit | Attending: Family Medicine | Admitting: Family Medicine

## 2023-12-27 DIAGNOSIS — R519 Headache, unspecified: Secondary | ICD-10-CM

## 2024-01-16 ENCOUNTER — Ambulatory Visit: Admitting: Podiatry

## 2024-01-16 ENCOUNTER — Encounter: Payer: Self-pay | Admitting: Podiatry

## 2024-01-16 DIAGNOSIS — E1142 Type 2 diabetes mellitus with diabetic polyneuropathy: Secondary | ICD-10-CM | POA: Diagnosis not present

## 2024-01-16 DIAGNOSIS — M79675 Pain in left toe(s): Secondary | ICD-10-CM

## 2024-01-16 DIAGNOSIS — B351 Tinea unguium: Secondary | ICD-10-CM

## 2024-01-16 DIAGNOSIS — M79674 Pain in right toe(s): Secondary | ICD-10-CM

## 2024-01-16 NOTE — Progress Notes (Signed)
 This patient returns to my office for at risk foot care.  This patient requires this care by a professional since this patient will be at risk due to having diabetic neuropathy. This patient is unable to cut nails herself since the patient cannot reach her nails.These nails are painful walking and wearing shoes.  This patient presents for at risk foot care today.  General Appearance  Alert, conversant and in no acute stress.  Vascular  Dorsalis pedis and posterior tibial  pulses are weakly  palpable  bilaterally.  Capillary return is within normal limits  bilaterally. Temperature is within normal limits  bilaterally.  Neurologic  Senn-Weinstein monofilament wire test within normal limits /diminished  bilaterally. Muscle power within normal limits bilaterally.  Nails Thick disfigured discolored nails with subungual debris  from hallux to fifth toes left foot.  Debride 2-5 nails right foot.. No evidence of bacterial infection or drainage bilaterally.  Orthopedic  No limitations of motion  feet .  No crepitus or effusions noted.  No bony pathology or digital deformities noted.  Skin  normotropic skin with no porokeratosis noted bilaterally.  No signs of infections or ulcers noted.     Onychomycosis  Pain in right toes  Pain in left toes  Consent was obtained for treatment procedures.   Mechanical debridement of nails 1-5  bilaterally performed with a nail nipper.  Filed with dremel without incident.    Return office visit                     Told patient to return for periodic foot care and evaluation due to potential at risk complications.   Cordella Bold DPM

## 2024-03-05 ENCOUNTER — Inpatient Hospital Stay

## 2024-03-05 ENCOUNTER — Inpatient Hospital Stay: Admitting: Oncology

## 2024-04-18 ENCOUNTER — Ambulatory Visit: Admitting: Podiatry
